# Patient Record
Sex: Female | Born: 1940 | Race: White | Hispanic: No | Marital: Married | Smoking: Never smoker
Health system: Southern US, Community
[De-identification: ages and names within clinical notes are randomized; demographics above are authoritative.]

## PROBLEM LIST (undated history)

## (undated) DIAGNOSIS — F32A Depression, unspecified: Secondary | ICD-10-CM

## (undated) DIAGNOSIS — I480 Paroxysmal atrial fibrillation: Secondary | ICD-10-CM

## (undated) DIAGNOSIS — E785 Hyperlipidemia, unspecified: Secondary | ICD-10-CM

## (undated) DIAGNOSIS — I10 Essential (primary) hypertension: Secondary | ICD-10-CM

## (undated) DIAGNOSIS — F329 Major depressive disorder, single episode, unspecified: Secondary | ICD-10-CM

## (undated) DIAGNOSIS — F419 Anxiety disorder, unspecified: Secondary | ICD-10-CM

## (undated) HISTORY — PX: CHOLECYSTECTOMY: SHX55

## (undated) HISTORY — DX: Depression, unspecified: F32.A

## (undated) HISTORY — DX: Anxiety disorder, unspecified: F41.9

## (undated) HISTORY — PX: ABDOMINAL HYSTERECTOMY: SHX81

## (undated) HISTORY — DX: Major depressive disorder, single episode, unspecified: F32.9

## (undated) HISTORY — PX: BYPASS AXILLA/BRACHIAL ARTERY: SHX6426

## (undated) HISTORY — DX: Paroxysmal atrial fibrillation: I48.0

---

## 2009-09-28 ENCOUNTER — Ambulatory Visit: Payer: Self-pay | Admitting: General Surgery

## 2009-10-18 ENCOUNTER — Ambulatory Visit: Payer: Self-pay | Admitting: General Surgery

## 2009-10-20 ENCOUNTER — Ambulatory Visit: Payer: Self-pay | Admitting: General Surgery

## 2011-11-20 DIAGNOSIS — E785 Hyperlipidemia, unspecified: Secondary | ICD-10-CM | POA: Insufficient documentation

## 2011-11-20 DIAGNOSIS — I10 Essential (primary) hypertension: Secondary | ICD-10-CM | POA: Insufficient documentation

## 2012-05-27 DIAGNOSIS — M419 Scoliosis, unspecified: Secondary | ICD-10-CM | POA: Insufficient documentation

## 2012-11-26 ENCOUNTER — Ambulatory Visit: Payer: Self-pay | Admitting: Internal Medicine

## 2014-02-14 DIAGNOSIS — M549 Dorsalgia, unspecified: Secondary | ICD-10-CM | POA: Insufficient documentation

## 2014-12-07 ENCOUNTER — Other Ambulatory Visit: Payer: Self-pay

## 2014-12-07 DIAGNOSIS — F411 Generalized anxiety disorder: Secondary | ICD-10-CM

## 2014-12-07 NOTE — Telephone Encounter (Signed)
pt forgot to ask for klonopin refill also.

## 2014-12-07 NOTE — Telephone Encounter (Signed)
Pt called . Pt requesting a refill on her tramadol hcl 50mg  tablet, 2 oral qid

## 2014-12-08 NOTE — Telephone Encounter (Signed)
Refill of clonazepam phoned into the [facility] with 5 refills. I do not fill her tramadol and reminded them of that on the phone.

## 2015-05-10 ENCOUNTER — Ambulatory Visit (INDEPENDENT_AMBULATORY_CARE_PROVIDER_SITE_OTHER): Payer: Medicare Other | Admitting: Psychiatry

## 2015-05-10 ENCOUNTER — Encounter: Payer: Self-pay | Admitting: Psychiatry

## 2015-05-10 VITALS — BP 128/84 | HR 76 | Temp 98.1°F | Ht 61.5 in | Wt 159.8 lb

## 2015-05-10 DIAGNOSIS — F411 Generalized anxiety disorder: Secondary | ICD-10-CM | POA: Diagnosis not present

## 2015-05-10 DIAGNOSIS — F4321 Adjustment disorder with depressed mood: Secondary | ICD-10-CM | POA: Diagnosis not present

## 2015-05-10 MED ORDER — CLONAZEPAM 0.5 MG PO TABS: 0.5000 mg | ORAL_TABLET | Freq: Four times a day (QID) | ORAL | Status: DC

## 2015-05-10 NOTE — Progress Notes (Signed)
[facility] Progress Note  05/10/2015 8:19 PM Renae GlossDorothy S Gosnell  MRN:  [id number] Subjective:  This is a follow-up note for this 74 year old woman who has generalized anxiety disorder and continues to have grief. Mood has been up and down but overall improving. No major depression. No suicidal thoughts. Intermittent tearfulness but manages to enjoy spending time with her family still. Anxiety under fairly good control. Functioning well. No sign of abuse of medication or side effects complained of. Principal Problem: @PPROB @ Diagnosis:   Patient Active Problem List   Diagnosis Date Noted  . Anxiety, generalized [F41.1] 12/07/2014   Total Time spent with patient: 25 minutes  Past Psychiatric History: patient has a history of anxiety and depression. History of poor response to trying to come off of medicines such as her Klonopin. Did not tolerate serotonin reuptake inhibitors. No history of suicide attempts or hospitalization.  Past Medical History:  Past Medical History  Diagnosis Date  . Anxiety   . Depression     Past Surgical History  Procedure Laterality Date  . Abdominal hysterectomy    . Bypass axilla/brachial artery    . Cholecystectomy     Family History:  Family History  Problem Relation Age of Onset  . Stroke Mother   . Heart attack Father   . Alcohol abuse Father   . Stroke Sister   . Diabetes Brother   . Colon cancer Sister    Family Psychiatric  History: family history positive for anxiety Social History:  History  Alcohol Use No     History  Drug Use No    Social History   Social History  . Marital Status: Married    Spouse Name: N/A  . Number of Children: N/A  . Years of Education: N/A   Social History Main Topics  . Smoking status: Never Smoker   . Smokeless tobacco: Never Used  . Alcohol Use: No  . Drug Use: No  . Sexual Activity: Not Currently   Other Topics Concern  . None   Social History Narrative  . None   Additional Social History:                          Sleep: Fair  Appetite:  Fair  Current Medications: Current Outpatient Prescriptions  Medication Sig Dispense Refill  . atorvastatin (LIPITOR) 40 MG tablet     . clonazePAM (KLONOPIN) 0.5 MG tablet Take 1 tablet (0.5 mg total) by mouth 4 (four) times daily. 120 tablet 5  . hydrochlorothiazide (HYDRODIURIL) 25 MG tablet     . metoprolol succinate (TOPROL-XL) 100 MG 24 hr tablet     . traMADol (ULTRAM) 50 MG tablet Take 50 mg by mouth 4 (four) times daily. Take 2 tablets by mouth qid     No current facility-administered medications for this visit.    Lab Results: No results found for this or any previous visit (from the past 48 hour(s)).  Physical Findings: AIMS:  , ,  ,  ,    CIWA:    COWS:     Musculoskeletal: Strength & Muscle Tone: within normal limits Gait & Station: normal Patient leans: N/A  Psychiatric Specialty Exam: ROS  Blood pressure 128/84, pulse 76, temperature 98.1 F (36.7 C), temperature source Tympanic, height 5' 1.5" (1.562 m), weight 159 lb 12.8 oz (72.485 kg), SpO2 95 %.Body mass index is 29.71 kg/(m^2).  General Appearance: Well Groomed  Eye Contact::  Good  Speech:  Clear  and Coherent  Volume:  Decreased  Mood:  Euthymic  Affect:  Congruent  Thought Process:  Goal Directed  Orientation:  Full (Time, Place, and Person)  Thought Content:  Negative  Suicidal Thoughts:  No  Homicidal Thoughts:  No  Memory:  Immediate;   Good Recent;   Fair Remote;   Fair  Judgement:  Fair  Insight:  Fair  Psychomotor Activity:  Normal  Concentration:  Fair  Recall:  [employer] of Knowledge:Fair  Language: Fair  Akathisia:  No  Handed:  Right  AIMS (if indicated):     Assets:  Communication Skills Desire for Improvement Financial Resources/Insurance Housing Leisure Time Physical Health Resilience  ADL's:  Intact  Cognition: WNL  Sleep:      Treatment Plan Summary: Medication management and Plan supportive counseling  completed. Encourage patient in her efforts to be socially active. No change to medicine. Reviewed side effects of the Klonopin. No sign of serious cognitive problems. Continue current dose of Klonopin follow-up 6 months. She agrees to the plan.  John Clapacs 05/10/2015, 8:19 PM

## 2015-06-04 ENCOUNTER — Other Ambulatory Visit: Payer: Self-pay | Admitting: Psychiatry

## 2015-06-04 NOTE — Telephone Encounter (Signed)
Called in refills to her pharmacy with 2 additional refills

## 2015-11-08 ENCOUNTER — Ambulatory Visit (INDEPENDENT_AMBULATORY_CARE_PROVIDER_SITE_OTHER): Payer: Medicare Other | Admitting: Psychiatry

## 2015-11-08 ENCOUNTER — Encounter: Payer: Self-pay | Admitting: Psychiatry

## 2015-11-08 VITALS — BP 122/70 | HR 83 | Temp 98.5°F | Ht 61.5 in | Wt 160.2 lb

## 2015-11-08 DIAGNOSIS — R7301 Impaired fasting glucose: Secondary | ICD-10-CM | POA: Insufficient documentation

## 2015-11-08 DIAGNOSIS — I2510 Atherosclerotic heart disease of native coronary artery without angina pectoris: Secondary | ICD-10-CM | POA: Insufficient documentation

## 2015-11-08 DIAGNOSIS — Z79899 Other long term (current) drug therapy: Secondary | ICD-10-CM | POA: Insufficient documentation

## 2015-11-08 DIAGNOSIS — F411 Generalized anxiety disorder: Secondary | ICD-10-CM | POA: Diagnosis not present

## 2015-11-08 MED ORDER — CLONAZEPAM 0.5 MG PO TABS: 0.5000 mg | ORAL_TABLET | Freq: Four times a day (QID) | ORAL | Status: DC

## 2015-11-29 ENCOUNTER — Other Ambulatory Visit: Payer: Self-pay | Admitting: Psychiatry

## 2016-05-08 ENCOUNTER — Telehealth: Payer: Self-pay

## 2016-05-08 ENCOUNTER — Other Ambulatory Visit: Payer: Self-pay | Admitting: Psychiatry

## 2016-05-08 MED ORDER — CLONAZEPAM 0.5 MG PO TABS: 0.5000 mg | ORAL_TABLET | Freq: Four times a day (QID) | ORAL | 1 refills | Status: DC

## 2016-05-08 NOTE — Progress Notes (Signed)
Phoned in a refill of her clonazepam to the drug store for a total of 2 months. Spoke with the patient and informed her I needed to cancel tomorrow's appointment and we can reschedule at a later date.

## 2016-05-08 NOTE — Telephone Encounter (Signed)
Medication request - Refill fax request for patient's Klonopin received.  Last ordered 11/08/15 + 5 refills and patient returns for scheduled evaluation on 05/09/16

## 2016-05-08 NOTE — Telephone Encounter (Signed)
I called the patient today to cancel her appointment. I have called in refills of her prescriptions to her pharmacy and this is documented in the chart.

## 2016-05-09 ENCOUNTER — Ambulatory Visit: Payer: Self-pay | Admitting: Psychiatry

## 2016-06-03 NOTE — Progress Notes (Signed)
Follow-up patient with chronic anxiety and long-term grieving. Still a lot of grieving around her husband as is appropriate. Despite that she is functioning adequately. No suicidal thoughts no psychosis. None of the severe anxiety attack she has had previously.  Neatly groomed woman looks her stated age. Cooperative with interview. Denies suicidal thoughts no evidence of psychosis.  No evidence of any misuse or problem from her benzodiazepines which will be continued as usual. Follow-up 6 months.

## 2016-06-06 ENCOUNTER — Encounter: Payer: Self-pay | Admitting: Psychiatry

## 2016-06-06 ENCOUNTER — Ambulatory Visit (INDEPENDENT_AMBULATORY_CARE_PROVIDER_SITE_OTHER): Payer: Medicare Other | Admitting: Psychiatry

## 2016-06-06 VITALS — BP 157/74 | HR 73 | Temp 97.6°F | Wt 152.4 lb

## 2016-06-06 DIAGNOSIS — F411 Generalized anxiety disorder: Secondary | ICD-10-CM

## 2016-06-06 MED ORDER — CLONAZEPAM 0.5 MG PO TABS: 0.5000 mg | ORAL_TABLET | Freq: Four times a day (QID) | ORAL | 5 refills | Status: DC

## 2016-06-06 NOTE — Progress Notes (Signed)
Follow-up for patient with chronic anxiety. No new complaints. Tolerates medicine well. No evidence of worsening memory problems mood problems or medicine abuse. Patient is alert and oriented. Clean and neat. Good eye contact. Normal psychomotor activity. Affect is upbeat and appropriate. Thoughts are lucid. No suicidal or homicidal ideation.  Continue clonazepam at the current dose which has been tolerated for years. Follow-up 6 months.

## 2016-12-05 ENCOUNTER — Ambulatory Visit (INDEPENDENT_AMBULATORY_CARE_PROVIDER_SITE_OTHER): Payer: Medicare Other | Admitting: Psychiatry

## 2016-12-05 ENCOUNTER — Encounter: Payer: Self-pay | Admitting: Psychiatry

## 2016-12-05 VITALS — BP 137/75 | HR 76 | Temp 97.9°F | Wt 155.4 lb

## 2016-12-05 DIAGNOSIS — F411 Generalized anxiety disorder: Secondary | ICD-10-CM | POA: Diagnosis not present

## 2016-12-05 MED ORDER — CLONAZEPAM 0.5 MG PO TABS: 0.5000 mg | ORAL_TABLET | Freq: Four times a day (QID) | ORAL | 5 refills | Status: DC

## 2016-12-05 NOTE — Progress Notes (Signed)
Follow-up for 76 year old woman with chronic anxiety. Recently apparently she told her primary care doctor that she could tolerate a lower dose of tramadol. She admits that she now wishes she had not done this. Patient is thinking about talking to Dr. Dareen PianoAnderson about going back to 6 pills a day. Otherwise mood is stable no sign of return of severe depression. Anxiety is under control.  Neatly dressed and groomed. Good eye contact. Appropriate affect. Lucid no thought disorder. No sign of suicidality no dangerousness no sign of severe memory complaints. Patient appears to be taking care of herself well.  Continue clonazepam 4 times a day well-tolerated follow-up in another 6 months.

## 2016-12-27 ENCOUNTER — Telehealth: Payer: Self-pay

## 2016-12-27 ENCOUNTER — Other Ambulatory Visit: Payer: Self-pay | Admitting: Psychiatry

## 2016-12-27 NOTE — Telephone Encounter (Signed)
I had thought that I had written it out by hand and given it to her, but I will call it in to the drugstore right this minute. Thank you.

## 2016-12-27 NOTE — Telephone Encounter (Signed)
recieved a fax requesting a refill on clonazepam .5mg  pt was last seen on 12-05-16 next appt 05-29-17.   Disp Refills Start End   clonazePAM (KLONOPIN) 0.5 MG tablet 120 tablet 5 12/05/2016    Sig - Route: Take 1 tablet (0.5 mg total) by mouth 4 (four) times daily. - Oral   Class: No Print

## 2016-12-27 NOTE — Progress Notes (Signed)
Although I am certain I wrote this out by hand at our last visit, I have called in a refill of her clonazepam as requested by her drugstore

## 2017-05-29 ENCOUNTER — Ambulatory Visit: Payer: Medicare Other | Admitting: Psychiatry

## 2017-06-14 ENCOUNTER — Encounter: Payer: Self-pay | Admitting: Psychiatry

## 2017-06-14 ENCOUNTER — Ambulatory Visit (INDEPENDENT_AMBULATORY_CARE_PROVIDER_SITE_OTHER): Payer: Medicare Other | Admitting: Psychiatry

## 2017-06-14 ENCOUNTER — Other Ambulatory Visit: Payer: Self-pay

## 2017-06-14 VITALS — BP 127/71 | HR 75 | Temp 97.5°F | Wt 145.4 lb

## 2017-06-14 DIAGNOSIS — F411 Generalized anxiety disorder: Secondary | ICD-10-CM | POA: Diagnosis not present

## 2017-06-14 MED ORDER — CLONAZEPAM 0.5 MG PO TABS: 0.5000 mg | ORAL_TABLET | Freq: Four times a day (QID) | ORAL | 5 refills | Status: DC

## 2017-06-14 NOTE — Progress Notes (Signed)
Follow-up for this 76 year old woman with generalized anxiety disorder.  She presents today in the company of her daughter.  Patient describes feeling significantly worse than usual.  For the last couple months she has been more nervous.  Pain has been worse.  Sleep is difficult.  Denies suicidal intent or plan but does feel hopeless.  She has become [place] phobic with a great deal of fear about leaving the house.  This has greatly affected her ability to interact with her family.  The story behind this is that the patient took it upon herself to drastically decrease her dose of tramadol a couple months ago.  Apparently she became convinced that Dr. Dareen PianoAnderson was going to stop her tramadol and so she cut herself down from 300 mg a day to 100 mg a day.  Within the last month she also tried decreasing her clonazepam from 4 pills a day to 3 pills a day for reasons that I still do not quite understand.  I reviewed the prescription on her bottle with her.  I do not have access to Dr. Ewell PoeAnderson's notes in my office but I told her that from what the bottle says I do not see any evidence that he was trying to taper her.  She could not remember him actually telling her to decrease the dose of her medicine.  Patient is anxious nervous.  Still is well dressed as usual.  Seems to be a little confused in her thinking.  No suicidal ideation or psychosis however.  Differential diagnosis is pain and anxiety from decreasing her medicine, worsening anxiety disorder itself, possible depression possible dementia.  Based on her past history I think the most likely thing is that this is all a result of decreasing her medicine.  I suggested that she go back to taking her regular full dose of all of her medicines and I would like to see her back in 1 or 2 weeks.  Patient agrees to the plan.  I also suggest that she call Dr. Dareen PianoAnderson to find out whether he had wanted to change her medicine or not.

## 2017-06-28 ENCOUNTER — Other Ambulatory Visit: Payer: Self-pay

## 2017-06-28 ENCOUNTER — Ambulatory Visit (INDEPENDENT_AMBULATORY_CARE_PROVIDER_SITE_OTHER): Payer: Medicare Other | Admitting: Psychiatry

## 2017-06-28 ENCOUNTER — Encounter: Payer: Self-pay | Admitting: Psychiatry

## 2017-06-28 VITALS — BP 139/78 | HR 66 | Temp 97.4°F | Wt 145.8 lb

## 2017-06-28 DIAGNOSIS — F411 Generalized anxiety disorder: Secondary | ICD-10-CM | POA: Diagnosis not present

## 2017-06-28 MED ORDER — ESCITALOPRAM OXALATE 5 MG PO TABS: 5.0000 mg | ORAL_TABLET | Freq: Every day | ORAL | 3 refills | Status: DC

## 2017-06-28 NOTE — Progress Notes (Signed)
Follow-up 77 year old woman with a history of chronic anxiety.  Patient has been back on her medicine at full dose and is feeling much better.  Not having panicky feelings not feeling agitated.  She did have a fall on New Year's but it sounds like it was related to snow and ice in the dark and not to her medicine.  Affect is calm although still slightly anxious.  Patient is neatly dressed and groomed and comes with another 1 of her daughters today.  There does seem to be a little bit of slowing in her thought process compared to what I was used to in the past.  No sign of dangerousness.  I spent some time explaining to the patient and her daughter the risks of benzodiazepines and pain medicines in the elderly and the reason why it is often the case that we try to minimize these medicines.  I reassured her that I would not be trying to taper her off medicine against her will but I did agree with the suggestion floated by 1 of her daughters that we try an antidepressant medicine.  Patient agrees to do it a trial of Lexapro 5 mg a day.  Side effects discussed.  We will follow-up in 3 months to see how she is doing.

## 2017-07-21 ENCOUNTER — Other Ambulatory Visit: Payer: Self-pay | Admitting: Psychiatry

## 2017-07-31 ENCOUNTER — Telehealth: Payer: Self-pay

## 2017-07-31 NOTE — Telephone Encounter (Signed)
  Prior Berkley Harveyauth is requested on this medication. Pt was last seen on  06-28-17   clonazePAM (KLONOPIN) 0.5 MG tablet  Medication  Date: 07/30/2017 Department: [facility] Ordering/Authorizing: Clapacs, Jackquline DenmarkJohn T, MD  Order Providers   Prescribing Provider Encounter Provider  Clapacs, Jackquline DenmarkJohn T, MD Clapacs, Jackquline DenmarkJohn T, MD  Medication Detail    Disp Refills Start End   clonazePAM (KLONOPIN) 0.5 MG tablet 120 tablet 0 07/30/2017    Sig: TAKE 1 TABLET BY MOUTH FOUR TIMES DAILY   Sent to pharmacy as: clonazePAM (KLONOPIN) 0.5 MG tablet   E-Prescribing Status: Receipt confirmed by pharmacy (07/30/2017 8:13 PM EST)   Ph[facility]   [facility] [id number] - [place], [place] - [address] OF [address]

## 2017-07-31 NOTE — Telephone Encounter (Signed)
PA - pending  done on [email or web address]

## 2017-08-06 NOTE — Telephone Encounter (Signed)
received approval notice for clonazepam .5mg  untll further notices.

## 2017-09-11 DIAGNOSIS — K529 Noninfective gastroenteritis and colitis, unspecified: Secondary | ICD-10-CM | POA: Insufficient documentation

## 2017-09-25 ENCOUNTER — Ambulatory Visit (INDEPENDENT_AMBULATORY_CARE_PROVIDER_SITE_OTHER): Payer: Medicare Other | Admitting: Psychiatry

## 2017-09-25 ENCOUNTER — Other Ambulatory Visit: Payer: Self-pay

## 2017-09-25 ENCOUNTER — Encounter: Payer: Self-pay | Admitting: Psychiatry

## 2017-09-25 VITALS — BP 129/71 | HR 70 | Temp 98.4°F | Wt 141.2 lb

## 2017-09-25 DIAGNOSIS — F411 Generalized anxiety disorder: Secondary | ICD-10-CM

## 2017-09-25 DIAGNOSIS — Z79899 Other long term (current) drug therapy: Secondary | ICD-10-CM | POA: Insufficient documentation

## 2017-09-25 MED ORDER — ESCITALOPRAM OXALATE 5 MG PO TABS: 5.0000 mg | ORAL_TABLET | Freq: Every day | ORAL | 5 refills | Status: DC

## 2017-09-25 MED ORDER — CLONAZEPAM 0.5 MG PO TABS: 0.5000 mg | ORAL_TABLET | Freq: Four times a day (QID) | ORAL | 5 refills | Status: DC

## 2017-09-25 NOTE — Progress Notes (Signed)
Follow-up for this 77 year old woman with a history of chronic anxiety.  Patient's mood is stated as being good.  Anxiety under good control.  No panic attacks no agitation.  She is able to drive her car once again.  Sleeping adequately.  No side effects of medicine.  Not oversedated no cognitive impairment.  Tolerating the low-dose of citalopram without difficulty.  Neatly dressed and groomed.  Good eye contact normal psychomotor activity.  Speech normal rate tone and volume.  Affect euthymic no thoughts of suicide no disorganized thinking no cognitive impairment  No physical complaints.  Cardiac complaints negative lung complaints negative.  Musculoskeletal negative.  Continue current medicines.  Renewed all prescriptions.  Supportive therapy and review of treatment plan follow-up 6 months.

## 2018-03-05 ENCOUNTER — Other Ambulatory Visit: Payer: Self-pay

## 2018-03-05 ENCOUNTER — Encounter: Payer: Self-pay | Admitting: Emergency Medicine

## 2018-03-05 ENCOUNTER — Emergency Department: Payer: Medicare Other

## 2018-03-05 ENCOUNTER — Observation Stay
Admission: EM | Admit: 2018-03-05 | Discharge: 2018-03-07 | Disposition: A | Discharge status: Home / Self Care | Payer: Medicare Other | Attending: Family Medicine | Admitting: Family Medicine

## 2018-03-05 DIAGNOSIS — I119 Hypertensive heart disease without heart failure: Secondary | ICD-10-CM | POA: Insufficient documentation

## 2018-03-05 DIAGNOSIS — K625 Hemorrhage of anus and rectum: Secondary | ICD-10-CM | POA: Diagnosis present

## 2018-03-05 DIAGNOSIS — Z9049 Acquired absence of other specified parts of digestive tract: Secondary | ICD-10-CM | POA: Insufficient documentation

## 2018-03-05 DIAGNOSIS — K5730 Diverticulosis of large intestine without perforation or abscess without bleeding: Secondary | ICD-10-CM | POA: Insufficient documentation

## 2018-03-05 DIAGNOSIS — Z7982 Long term (current) use of aspirin: Secondary | ICD-10-CM | POA: Insufficient documentation

## 2018-03-05 DIAGNOSIS — K922 Gastrointestinal hemorrhage, unspecified: Secondary | ICD-10-CM | POA: Diagnosis present

## 2018-03-05 DIAGNOSIS — I2510 Atherosclerotic heart disease of native coronary artery without angina pectoris: Secondary | ICD-10-CM | POA: Diagnosis not present

## 2018-03-05 DIAGNOSIS — Z880 Allergy status to penicillin: Secondary | ICD-10-CM | POA: Insufficient documentation

## 2018-03-05 DIAGNOSIS — F329 Major depressive disorder, single episode, unspecified: Secondary | ICD-10-CM | POA: Diagnosis not present

## 2018-03-05 DIAGNOSIS — Z951 Presence of aortocoronary bypass graft: Secondary | ICD-10-CM | POA: Diagnosis not present

## 2018-03-05 DIAGNOSIS — Z66 Do not resuscitate: Secondary | ICD-10-CM | POA: Insufficient documentation

## 2018-03-05 DIAGNOSIS — E119 Type 2 diabetes mellitus without complications: Secondary | ICD-10-CM | POA: Insufficient documentation

## 2018-03-05 DIAGNOSIS — Z90710 Acquired absence of both cervix and uterus: Secondary | ICD-10-CM | POA: Diagnosis not present

## 2018-03-05 DIAGNOSIS — Z79899 Other long term (current) drug therapy: Secondary | ICD-10-CM | POA: Insufficient documentation

## 2018-03-05 DIAGNOSIS — E785 Hyperlipidemia, unspecified: Secondary | ICD-10-CM | POA: Diagnosis not present

## 2018-03-05 DIAGNOSIS — Z23 Encounter for immunization: Secondary | ICD-10-CM | POA: Diagnosis not present

## 2018-03-05 DIAGNOSIS — K921 Melena: Secondary | ICD-10-CM | POA: Diagnosis not present

## 2018-03-05 DIAGNOSIS — K5790 Diverticulosis of intestine, part unspecified, without perforation or abscess without bleeding: Secondary | ICD-10-CM

## 2018-03-05 DIAGNOSIS — M419 Scoliosis, unspecified: Secondary | ICD-10-CM | POA: Insufficient documentation

## 2018-03-05 DIAGNOSIS — F411 Generalized anxiety disorder: Secondary | ICD-10-CM | POA: Diagnosis not present

## 2018-03-05 DIAGNOSIS — Z882 Allergy status to sulfonamides status: Secondary | ICD-10-CM | POA: Diagnosis not present

## 2018-03-05 HISTORY — DX: Hyperlipidemia, unspecified: E78.5

## 2018-03-05 HISTORY — DX: Essential (primary) hypertension: I10

## 2018-03-05 LAB — LIPID PANEL
Cholesterol: 110 mg/dL (ref 0–200)
HDL: 35 mg/dL — ABNORMAL LOW (ref >40)
LDL Cholesterol: 42 mg/dL (ref 0–99)
TRIGLYCERIDES: 165 mg/dL — AB (ref <150)
Total CHOL/HDL Ratio: 3.1 RATIO
VLDL: 33 mg/dL (ref 0–40)

## 2018-03-05 LAB — CBC WITH DIFFERENTIAL/PLATELET
BASOS ABS: 0 10*3/uL (ref 0–0.1)
Basophils Relative: 1 %
Eosinophils Absolute: 0 10*3/uL (ref 0–0.7)
Eosinophils Relative: 1 %
HCT: 39.9 % (ref 35.0–47.0)
Hemoglobin: 13.2 g/dL (ref 12.0–16.0)
LYMPHS PCT: 23 %
Lymphs Abs: 1.7 10*3/uL (ref 1.0–3.6)
MCH: 29.6 pg (ref 26.0–34.0)
MCHC: 33 g/dL (ref 32.0–36.0)
MCV: 89.7 fL (ref 80.0–100.0)
Monocytes Absolute: 0.6 10*3/uL (ref 0.2–0.9)
Monocytes Relative: 9 %
Neutro Abs: 4.8 10*3/uL (ref 1.4–6.5)
Neutrophils Relative %: 66 %
Platelets: 218 10*3/uL (ref 150–440)
RBC: 4.45 MIL/uL (ref 3.80–5.20)
RDW: 13.9 % (ref 11.5–14.5)
WBC: 7.2 10*3/uL (ref 3.6–11.0)

## 2018-03-05 LAB — COMPREHENSIVE METABOLIC PANEL
ALT: 25 U/L (ref 0–44)
AST: 33 U/L (ref 15–41)
Albumin: 3.6 g/dL (ref 3.5–5.0)
Alkaline Phosphatase: 80 U/L (ref 38–126)
Anion gap: 11 (ref 5–15)
BUN: 16 mg/dL (ref 8–23)
CHLORIDE: 99 mmol/L (ref 98–111)
CO2: 28 mmol/L (ref 22–32)
Calcium: 9.1 mg/dL (ref 8.9–10.3)
Creatinine, Ser: 0.74 mg/dL (ref 0.44–1.00)
GFR calc Af Amer: >60 mL/min (ref >60)
Glucose, Bld: 135 mg/dL — ABNORMAL HIGH (ref 70–99)
Potassium: 3.3 mmol/L — ABNORMAL LOW (ref 3.5–5.1)
Sodium: 138 mmol/L (ref 135–145)
TOTAL PROTEIN: 7.5 g/dL (ref 6.5–8.1)
Total Bilirubin: 0.6 mg/dL (ref 0.3–1.2)

## 2018-03-05 LAB — TYPE AND SCREEN
ABO/RH(D): O POS
Antibody Screen: NEGATIVE

## 2018-03-05 LAB — LIPASE, BLOOD: LIPASE: 22 U/L (ref 11–51)

## 2018-03-05 LAB — TROPONIN I: Troponin I: <0.03 ng/mL (ref <0.03)

## 2018-03-05 LAB — PROTIME-INR
INR: 1.03
Prothrombin Time: 13.4 seconds (ref 11.4–15.2)

## 2018-03-05 LAB — HEMOGLOBIN
HEMOGLOBIN: 13.2 g/dL (ref 12.0–16.0)
HEMOGLOBIN: 12.7 g/dL (ref 12.0–16.0)

## 2018-03-05 MED ORDER — OMEGA-3-ACID ETHYL ESTERS 1 G PO CAPS
1.0000 g | ORAL_CAPSULE | Freq: Every day | ORAL | Status: DC
  Administered 2018-03-06: 1 g via ORAL
  Filled 2018-03-05: qty 1

## 2018-03-05 MED ORDER — ACETAMINOPHEN 325 MG PO TABS: 650.0000 mg | ORAL_TABLET | Freq: Four times a day (QID) | ORAL | Status: DC | PRN

## 2018-03-05 MED ORDER — ONDANSETRON HCL 4 MG PO TABS: 4.0000 mg | ORAL_TABLET | Freq: Four times a day (QID) | ORAL | Status: DC | PRN

## 2018-03-05 MED ORDER — INFLUENZA VAC SPLIT HIGH-DOSE 0.5 ML IM SUSY
0.5000 mL | PREFILLED_SYRINGE | INTRAMUSCULAR | Status: AC
  Administered 2018-03-06: 0.5 mL via INTRAMUSCULAR
  Filled 2018-03-05 (×2): qty 0.5

## 2018-03-05 MED ORDER — ACETAMINOPHEN 650 MG RE SUPP: 650.0000 mg | Freq: Four times a day (QID) | RECTAL | Status: DC | PRN

## 2018-03-05 MED ORDER — ADULT MULTIVITAMIN W/MINERALS CH
1.0000 | ORAL_TABLET | Freq: Every day | ORAL | Status: DC
  Administered 2018-03-06: 1 via ORAL
  Filled 2018-03-05: qty 1

## 2018-03-05 MED ORDER — CLONAZEPAM 0.5 MG PO TABS
0.5000 mg | ORAL_TABLET | Freq: Four times a day (QID) | ORAL | Status: DC
  Administered 2018-03-05 – 2018-03-06 (×6): 0.5 mg via ORAL
  Filled 2018-03-05 (×6): qty 1

## 2018-03-05 MED ORDER — IOPAMIDOL (ISOVUE-300) INJECTION 61%
100.0000 mL | Freq: Once | INTRAVENOUS | Status: AC | PRN
  Administered 2018-03-05: 100 mL via INTRAVENOUS

## 2018-03-05 MED ORDER — METOPROLOL SUCCINATE ER 50 MG PO TB24
50.0000 mg | ORAL_TABLET | Freq: Every day | ORAL | Status: DC
  Administered 2018-03-05 – 2018-03-06 (×2): 50 mg via ORAL
  Filled 2018-03-05 (×2): qty 1

## 2018-03-05 MED ORDER — TRAMADOL HCL 50 MG PO TABS
100.0000 mg | ORAL_TABLET | Freq: Two times a day (BID) | ORAL | Status: DC
  Administered 2018-03-05 – 2018-03-06 (×3): 100 mg via ORAL
  Filled 2018-03-05 (×3): qty 2

## 2018-03-05 MED ORDER — ONDANSETRON HCL 4 MG/2ML IJ SOLN: 4.0000 mg | Freq: Four times a day (QID) | INTRAMUSCULAR | Status: DC | PRN

## 2018-03-05 MED ORDER — METOPROLOL SUCCINATE ER 50 MG PO TB24
100.0000 mg | ORAL_TABLET | Freq: Every morning | ORAL | Status: DC
  Administered 2018-03-07: 100 mg via ORAL
  Filled 2018-03-05 (×2): qty 2

## 2018-03-05 MED ORDER — POTASSIUM GLUCONATE 550 MG PO TABS: 1100.0000 mg | ORAL_TABLET | Freq: Every day | ORAL | Status: DC

## 2018-03-05 MED ORDER — ESCITALOPRAM OXALATE 10 MG PO TABS
5.0000 mg | ORAL_TABLET | Freq: Every day | ORAL | Status: DC
  Administered 2018-03-06: 5 mg via ORAL
  Filled 2018-03-05 (×2): qty 0.5

## 2018-03-05 MED ORDER — ATORVASTATIN CALCIUM 20 MG PO TABS
40.0000 mg | ORAL_TABLET | Freq: Every day | ORAL | Status: DC
  Administered 2018-03-05 – 2018-03-06 (×2): 40 mg via ORAL
  Filled 2018-03-05 (×2): qty 2

## 2018-03-05 MED ORDER — POTASSIUM CHLORIDE ER 8 MEQ PO TBCR
8.0000 meq | EXTENDED_RELEASE_TABLET | Freq: Two times a day (BID) | ORAL | Status: DC
  Administered 2018-03-05 – 2018-03-06 (×2): 8 meq via ORAL
  Filled 2018-03-05 (×5): qty 1

## 2018-03-05 NOTE — ED Notes (Signed)
Pt helped to bedside toilet.

## 2018-03-05 NOTE — ED Notes (Signed)
Diet Coke given. OK per EDP.

## 2018-03-05 NOTE — ED Notes (Signed)
ED Provider at bedside. 

## 2018-03-05 NOTE — ED Notes (Signed)
Pt collected urine spec but bleeding noted from rectum into sample, MD aware , not to send at this time due to unclean sample

## 2018-03-05 NOTE — ED Notes (Signed)
Pt transferred with stretcher to CT

## 2018-03-05 NOTE — H&P (Signed)
[facility] - [place] at [facility]   PATIENT NAME: Michelle Bauer    MR#:  [id number]  DATE OF BIRTH:  July 21, 1940  DATE OF ADMISSION:  03/05/2018  PRIMARY CARE PHYSICIAN: [name], MD   REQUESTING/REFERRING PHYSICIAN: Dr. Daryel November  CHIEF COMPLAINT:   Chief Complaint  Patient presents with  . Rectal Bleeding    HISTORY OF PRESENT ILLNESS:  Michelle Bauer  is a 77 y.o. female with a known history of hypertension, hyperlipidemia, anxiety/depression, severe scoliosis who presents to the hospital due to multiple episodes of rectal bleeding.  Patient says she was in her usual state of health when 2:00 this morning she woke up and started having bloody stools.  Patient says her stools initially had some brown stool mixed with fresh blood but this morning is been mostly fresh blood with some clots.  She came to the ER and has had multiple episodes of rectal bleeding.  Her hemoglobin is stable, but given her ongoing rectal bleeding hospitalist services were contacted for admission.  Patient had some abdominal cramping earlier but it has resolved now, she denies any fevers chills chest pain shortness of breath or any other associated symptoms.  PAST MEDICAL HISTORY:   Past Medical History:  Diagnosis Date  . Anxiety   . Depression   . Essential hypertension   . Hyperlipidemia     PAST SURGICAL HISTORY:   Past Surgical History:  Procedure Laterality Date  . ABDOMINAL HYSTERECTOMY    . BYPASS AXILLA/BRACHIAL ARTERY    . CHOLECYSTECTOMY      SOCIAL HISTORY:   Social History   Tobacco Use  . Smoking status: Never Smoker  . Smokeless tobacco: Never Used  Substance Use Topics  . Alcohol use: No    Alcohol/week: 0.0 standard drinks    FAMILY HISTORY:   Family History  Problem Relation Age of Onset  . Stroke Mother   . Heart attack Father   . Alcohol abuse Father   . Stroke Sister   . Diabetes Brother   . Colon cancer Sister     DRUG  ALLERGIES:   Allergies  Allergen Reactions  . Other Other (See Comments)  . Penicillins Other (See Comments)    Has patient had a PCN reaction causing immediate rash, facial/tongue/throat swelling, SOB or lightheadedness with hypotension: Unknown Has patient had a PCN reaction causing severe rash involving mucus membranes or skin necrosis: Unknown Has patient had a PCN reaction that required hospitalization: Unknown Has patient had a PCN reaction occurring within the last 10 years: No If all of the above answers are "NO", then may proceed with Cephalosporin use.  . Sulfa Antibiotics Other (See Comments)    REVIEW OF SYSTEMS:   Review of Systems  Constitutional: Negative for fever and weight loss.  HENT: Negative for congestion, nosebleeds and tinnitus.   Eyes: Negative for blurred vision, double vision and redness.  Respiratory: Negative for cough, hemoptysis and shortness of breath.   Cardiovascular: Negative for chest pain, orthopnea, leg swelling and PND.  Gastrointestinal: Positive for blood in stool. Negative for abdominal pain, diarrhea, melena, nausea and vomiting.  Genitourinary: Negative for dysuria, hematuria and urgency.  Musculoskeletal: Negative for falls and joint pain.  Neurological: Negative for dizziness, tingling, sensory change, focal weakness, seizures, weakness and headaches.  Endo/Heme/Allergies: Negative for polydipsia. Does not bruise/bleed easily.  Psychiatric/Behavioral: Negative for depression and memory loss. The patient is not nervous/anxious.     MEDICATIONS AT HOME:   Prior to  Admission medications   Medication Sig Start Date End Date Taking? Authorizing Provider  aspirin 325 MG tablet Take 325 mg by mouth daily.   Yes [provider]  atorvastatin (LIPITOR) 40 MG tablet Take 40 mg by mouth daily with supper.    Yes [provider]  clonazePAM (KLONOPIN) 0.5 MG tablet Take 1 tablet (0.5 mg total) by mouth 4 (four) times daily.  09/25/17  Yes Clapacs, Jackquline DenmarkJohn T, MD  escitalopram (LEXAPRO) 5 MG tablet Take 1 tablet (5 mg total) by mouth daily. 09/25/17  Yes Clapacs, Jackquline DenmarkJohn T, MD  hydrochlorothiazide (HYDRODIURIL) 25 MG tablet Take 25 mg by mouth daily.    Yes [provider]  metoprolol succinate (TOPROL-XL) 100 MG 24 hr tablet Take 50-100 mg by mouth See admin instructions. 100 mg every morning and 50 mg at bedtime   Yes [provider]  Multiple Vitamins-Minerals (ONE-A-DAY WOMENS 50 PLUS) TABS Take 1 tablet by mouth daily.   Yes [provider]  omega-3 acid ethyl esters (LOVAZA) 1 g capsule Take 1 g by mouth daily with lunch.   Yes [provider]  Potassium Gluconate 550 MG TABS Take 1,100 mg by mouth daily.   Yes [provider]  traMADol (ULTRAM) 50 MG tablet Take 100 mg by mouth 2 (two) times daily.    Yes Clapacs, Jackquline DenmarkJohn T, MD      VITAL SIGNS:  Blood pressure 132/63, pulse 66, temperature 99.1 F (37.3 C), temperature source Oral, resp. rate 20, height 5\' 2"  (1.575 m), weight 64.4 kg, SpO2 100 %.  PHYSICAL EXAMINATION:  Physical Exam  GENERAL:  77 y.o.-year-old patient lying in the bed with no acute distress.  EYES: Pupils equal, round, reactive to light and accommodation. No scleral icterus. Extraocular muscles intact.  HEENT: Head atraumatic, normocephalic. Oropharynx and nasopharynx clear. No oropharyngeal erythema, moist oral mucosa  NECK:  Supple, no jugular venous distention. No thyroid enlargement, no tenderness.  LUNGS: Normal breath sounds bilaterally, no wheezing, rales, rhonchi. No use of accessory muscles of respiration.  CARDIOVASCULAR: S1, S2 RRR. No murmurs, rubs, gallops, clicks.  ABDOMEN: Soft, nontender, nondistended. Bowel sounds present. No organomegaly or mass.  EXTREMITIES: No pedal edema, cyanosis, or clubbing. + 2 pedal & radial pulses b/l.   NEUROLOGIC: Cranial nerves II through XII are intact. No focal Motor or sensory deficits appreciated  b/l PSYCHIATRIC: The patient is alert and oriented x 3. SKIN: No obvious rash, lesion, or ulcer.  Musc:  Severe Scoliosis.    LABORATORY PANEL:   CBC Recent Labs  Lab 03/05/18 1019  WBC 7.2  HGB 13.2  HCT 39.9  PLT 218   ------------------------------------------------------------------------------------------------------------------  Chemistries  Recent Labs  Lab 03/05/18 1019  NA 138  K 3.3*  CL 99  CO2 28  GLUCOSE 135*  BUN 16  CREATININE 0.74  CALCIUM 9.1  AST 33  ALT 25  ALKPHOS 80  BILITOT 0.6   ------------------------------------------------------------------------------------------------------------------  Cardiac Enzymes Recent Labs  Lab 03/05/18 1019  TROPONINI <0.03   ------------------------------------------------------------------------------------------------------------------  RADIOLOGY:  Ct Abdomen Pelvis W Contrast  Result Date: 03/05/2018 CLINICAL DATA:  Rectal bleeding, abdominal pain. EXAM: CT ABDOMEN AND PELVIS WITH CONTRAST TECHNIQUE: Multidetector CT imaging of the abdomen and pelvis was performed using the standard protocol following bolus administration of intravenous contrast. CONTRAST:  100mL ISOVUE-300 IOPAMIDOL (ISOVUE-300) INJECTION 61% COMPARISON:  None. FINDINGS: Lower chest: Cardiomegaly.  No acute abnormality. Hepatobiliary: No focal liver abnormality is seen. Status post cholecystectomy. No biliary dilatation. Pancreas: No  focal abnormality or ductal dilatation. Spleen: No focal abnormality.  Normal size. Adrenals/Urinary Tract: No adrenal abnormality. No focal renal abnormality. No stones or hydronephrosis. Urinary bladder is unremarkable. Stomach/Bowel: Severe diverticulosis in the descending colon and sigmoid colon. Slight haziness adjacent to the distal descending colon could reflect early active diverticulitis. No evidence of bowel obstruction. Moderate stool burden. Appendix is normal. Vascular/Lymphatic: Aortic  atherosclerosis. No enlarged abdominal or pelvic lymph nodes. Reproductive: Prior hysterectomy.  No adnexal masses. Other: No free fluid or free air. Musculoskeletal: Severe thoracolumbar scoliosis. No acute bony abnormality. IMPRESSION: Severe descending colonic and sigmoid diverticulosis. Slight stranding around the distal descending colon could reflect early active diverticulitis. Moderate stool burden throughout the colon. Prior cholecystectomy and hysterectomy. Cardiomegaly. Severe thoracolumbar scoliosis. Electronically Signed   By: Charlett Nose M.D.   On: 03/05/2018 11:44     IMPRESSION AND PLAN:   77 year old female with past medical history of hypertension, hyperlipidemia, anxiety/depression, severe scoliosis who presents to the hospital due to multiple episodes of rectal bleeding.  1.  GI bleed-this is suspected to be a lower GI bleed given her rectal bleeding.  Suspect this is likely diverticular in nature.  Patient had a CT scan of her abdomen pelvis which shows severe colonic and sigmoid diverticulosis without evidence of acute diverticulitis. - We will observe the patient overnight, follow serial hemoglobins.  Transfuse if hemoglobin falls below 7. - Place patient on clear liquid diet, hold her aspirin, will get a gastroenterology consult.  Discussed the case with Dr. Servando Snare.   2.  Essential hypertension-continue Toprol, hold hydrochlorothiazide for now.  3.  Anxiety/depression-continue Klonopin, Lexapro.  4.  Hyperlipidemia-continue atorvastatin.  5. Hx of Severe Scoliosis - cont. Tramadol PRN.    All the records are reviewed and case discussed with ED provider. Management plans discussed with the patient, family and they are in agreement.  CODE STATUS: DNR  TOTAL TIME TAKING CARE OF THIS PATIENT: 40 minutes.    Houston Siren M.D on 03/05/2018 at 1:24 PM  Between 7am to 6pm - Pager - [phone]  After 6pm go to [email or web address] - password [email or web address] [facility]  [place]  [facility]  Office  [phone]  CC: Primary care physician; [name], MD

## 2018-03-05 NOTE — ED Notes (Signed)
Assisted to bathroom

## 2018-03-05 NOTE — Progress Notes (Signed)
   [employer] - [place] at [facility]   Advance care planning  Hospital Day: 0 days Michelle Bauer is a 77 y.o. female past medical history of diabetes, hypertension, anxiety/depression, history of severe scoliosis presenting with Rectal Bleeding .   Discussed the patient's CODE STATUS with daughter at bedside.  Explained to the patient difference between DNR and full code, and after these discussions patient does not want to have aggressive resuscitation or measures.  Pt. Is DNR.    Advance care planning discussed with patient  with additional Family at bedside. All questions in regards to overall condition and expected prognosis answered. The decision was made to change current code status  CODE STATUS: dnr Time spent: 16 minutes

## 2018-03-05 NOTE — ED Triage Notes (Signed)
Pt reports this am started with rectal bleeding. Pt states some clots in the blood. Pt reports some of the clots are dark in color and some bright red blood as well. Pt c/o pain to abdomen.

## 2018-03-05 NOTE — ED Notes (Signed)
Daughter at bedside.

## 2018-03-05 NOTE — ED Notes (Signed)
Admitting MD at bedside.

## 2018-03-05 NOTE — ED Notes (Signed)
Pt brought beef broth and saltines. OK per EDP.

## 2018-03-05 NOTE — ED Notes (Signed)
Pt returns from CT.

## 2018-03-05 NOTE — ED Notes (Signed)
Daughter asked for lights to be turned down in room. Pt resting at this time. Daughter remains at bedside.

## 2018-03-05 NOTE — ED Provider Notes (Signed)
[facility] Emergency Department Provider Note       Time seen: ----------------------------------------- 10:05 AM on 03/05/2018 -----------------------------------------   I have reviewed the triage vital signs and the nursing notes.  HISTORY   Chief Complaint Rectal Bleeding    HPI Michelle Bauer is a 77 y.o. female with a history of anxiety, depression, coronary artery disease, high blood pressure, hyperlipidemia who presents to the ED for rectal bleeding.  Patient noted some clots in the blood coming from her rectum.  She states some clots are dark in color and some of bright red as well.  She is complaining of pain to the abdomen across the entire lower abdomen.  She has never had this happen before.  Nothing makes the symptoms better or worse.  Past Medical History:  Diagnosis Date  . Anxiety   . Depression     Patient Active Problem List   Diagnosis Date Noted  . Encounter for long-term (current) use of other medications 09/25/2017  . Chronic diarrhea 09/11/2017  . CAD in native artery 11/08/2015  . Other long term (current) drug therapy 11/08/2015  . Elevated fasting blood sugar 11/08/2015  . Anxiety, generalized 12/07/2014  . Back ache 02/14/2014  . Scoliosis 05/27/2012  . BP (high blood pressure) 11/20/2011  . HLD (hyperlipidemia) 11/20/2011    Past Surgical History:  Procedure Laterality Date  . ABDOMINAL HYSTERECTOMY    . BYPASS AXILLA/BRACHIAL ARTERY    . CHOLECYSTECTOMY      Allergies Other; Penicillins; and Sulfa antibiotics  Social History Social History   Tobacco Use  . Smoking status: Never Smoker  . Smokeless tobacco: Never Used  Substance Use Topics  . Alcohol use: No    Alcohol/week: 0.0 standard drinks  . Drug use: No   Review of Systems Constitutional: Negative for fever. Cardiovascular: Negative for chest pain. Respiratory: Negative for shortness of breath. Gastrointestinal: Positive for abdominal pain,  negative for vomiting and diarrhea.  Positive for rectal bleeding Musculoskeletal: Negative for back pain. Skin: Negative for rash. Neurological: Negative for headaches, focal weakness or numbness.  All systems negative/normal/unremarkable except as stated in the HPI  ____________________________________________   PHYSICAL EXAM:  VITAL SIGNS: ED Triage Vitals  Enc Vitals Group     BP 03/05/18 0952 140/71     Pulse Rate 03/05/18 0952 76     Resp 03/05/18 0952 20     Temp 03/05/18 0952 99.1 F (37.3 C)     Temp Source 03/05/18 0952 Oral     SpO2 03/05/18 0952 97 %     Weight 03/05/18 0953 142 lb (64.4 kg)     Height 03/05/18 0953 5\' 2"  (1.575 m)     Head Circumference --      Peak Flow --      Pain Score 03/05/18 0952 5     Pain Loc --      Pain Edu? --      Excl. in GC? --    Constitutional: Alert and oriented. Well appearing and in no distress. Eyes: Conjunctivae are normal. Normal extraocular movements. Cardiovascular: Normal rate, regular rhythm. No murmurs, rubs, or gallops. Respiratory: Normal respiratory effort without tachypnea nor retractions. Breath sounds are clear and equal bilaterally. No wheezes/rales/rhonchi. Gastrointestinal: Lower abdominal tenderness that is nonfocal, no rebound or guarding, normal bowel sounds Rectal: Bright red blood is noted, no hemorrhoids Musculoskeletal: Nontender with normal range of motion in extremities. No lower extremity tenderness nor edema. Neurologic:  Normal speech and language. No  gross focal neurologic deficits are appreciated.  Skin:  Skin is warm, dry and intact. No rash noted. Psychiatric: Mood and affect are normal. Speech and behavior are normal.  ____________________________________________  ED COURSE:  As part of my medical decision making, I reviewed the following data within the electronic MEDICAL RECORD NUMBER History obtained from family if available, nursing notes, old chart and ekg, as well as notes from prior ED  visits. Patient presented for rectal bleeding, we will assess with labs and imaging as indicated at this time.   Procedures ____________________________________________   LABS (pertinent positives/negatives)  Labs Reviewed  COMPREHENSIVE METABOLIC PANEL - Abnormal; Notable for the following components:      Result Value   Potassium 3.3 (*)    Glucose, Bld 135 (*)    All other components within normal limits  LIPID PANEL - Abnormal; Notable for the following components:   Triglycerides 165 (*)    HDL 35 (*)    All other components within normal limits  CBC WITH DIFFERENTIAL/PLATELET  LIPASE, BLOOD  TROPONIN I  PROTIME-INR  URINALYSIS, COMPLETE (UACMP) WITH MICROSCOPIC  TYPE AND SCREEN    RADIOLOGY Images were viewed by me  CT the abdomen pelvis with contrast IMPRESSION: Severe descending colonic and sigmoid diverticulosis. Slight stranding around the distal descending colon could reflect early active diverticulitis.  Moderate stool burden throughout the colon.  Prior cholecystectomy and hysterectomy.  Cardiomegaly.  Severe thoracolumbar scoliosis. ____________________________________________  DIFFERENTIAL DIAGNOSIS   Diverticulosis, diverticulitis, internal hemorrhoid, colon cancer  FINAL ASSESSMENT AND PLAN  Rectal bleeding   Plan: The patient had presented for rectal bleeding. Patient's labs were unremarkable. Patient's imaging did reveal significant diverticulosis which is likely the cause of her bleeding.  There is the possibility of diverticulitis although I think this is less likely.  She has had more episodes of rectal bleeding here.  I will discuss with the hospitalist for admission.   Ulice Dash, MD   Note: This note was generated in part or whole with voice recognition software. Voice recognition is usually quite accurate but there are transcription errors that can and very often do occur. I apologize for any typographical errors  that were not detected and corrected.     Emily Filbert, MD 03/05/18 1149

## 2018-03-06 DIAGNOSIS — K921 Melena: Secondary | ICD-10-CM | POA: Diagnosis not present

## 2018-03-06 DIAGNOSIS — K625 Hemorrhage of anus and rectum: Secondary | ICD-10-CM

## 2018-03-06 LAB — CBC
HEMATOCRIT: 36 % (ref 35.0–47.0)
Hemoglobin: 12.4 g/dL (ref 12.0–16.0)
MCH: 30.2 pg (ref 26.0–34.0)
MCHC: 34.4 g/dL (ref 32.0–36.0)
MCV: 87.9 fL (ref 80.0–100.0)
PLATELETS: 202 10*3/uL (ref 150–440)
RBC: 4.1 MIL/uL (ref 3.80–5.20)
RDW: 13.7 % (ref 11.5–14.5)
WBC: 6.6 10*3/uL (ref 3.6–11.0)

## 2018-03-06 MED ORDER — HYDRALAZINE HCL 20 MG/ML IJ SOLN: 10.0000 mg | INTRAMUSCULAR | Status: DC | PRN

## 2018-03-06 MED ORDER — PEG 3350-KCL-NA BICARB-NACL 420 G PO SOLR
4000.0000 mL | Freq: Once | ORAL | Status: AC
  Administered 2018-03-06: 4000 mL via ORAL
  Filled 2018-03-06 (×3): qty 4000

## 2018-03-06 NOTE — Consult Note (Signed)
Michelle Miniumarren Kynsley Whitehouse, MD [facility]  [address] [place], [place] [address] Phone: [phone] Fax : [phone]  Consultation  Referring Provider:     No ref. provider found Primary Care Physician:  Lauro RegulusAnderson, Marshall W, MD Primary Gastroenterologist: Gentry FitzUnassigned         Reason for Consultation:     Hematochezia  Date of Admission:  03/05/2018 Date of Consultation:  03/06/2018         HPI:   Michelle Bauer is a 77 y.o. female reports that she started to have rectal bleeding about 2:00 yesterday and that brought to the hospital.  The patient denies ever having rectal bleeding in the past.  The patient also reports that she had a colonoscopy over 10 years ago.  There is no report of any abdominal pain with rectal bleeding but she did have some crampy lower abdominal pain after the bleeding it started.  The patient denies any shortness of breath black stools or nausea vomiting.  She stated that she had multiple episodes of rectal bleeding which concerned her and therefore she came to the hospital.  The patient also reports that her bleeding has slowed down and she has not seen any further bleeding today.  On admission the patient's hemoglobin was 13.2 with her hemoglobin of 12.4 this morning.  Past Medical History:  Diagnosis Date  . Anxiety   . Depression   . Essential hypertension   . Hyperlipidemia     Past Surgical History:  Procedure Laterality Date  . ABDOMINAL HYSTERECTOMY    . BYPASS AXILLA/BRACHIAL ARTERY    . CHOLECYSTECTOMY      Prior to Admission medications   Medication Sig Start Date End Date Taking? Authorizing Provider  aspirin 325 MG tablet Take 325 mg by mouth daily.   Yes [provider]  atorvastatin (LIPITOR) 40 MG tablet Take 40 mg by mouth daily with supper.    Yes [provider]  clonazePAM (KLONOPIN) 0.5 MG tablet Take 1 tablet (0.5 mg total) by mouth 4 (four) times daily. 09/25/17  Yes Clapacs, Jackquline DenmarkJohn T, MD  escitalopram (LEXAPRO) 5 MG  tablet Take 1 tablet (5 mg total) by mouth daily. 09/25/17  Yes Clapacs, Jackquline DenmarkJohn T, MD  hydrochlorothiazide (HYDRODIURIL) 25 MG tablet Take 25 mg by mouth daily.    Yes [provider]  metoprolol succinate (TOPROL-XL) 100 MG 24 hr tablet Take 50-100 mg by mouth See admin instructions. 100 mg every morning and 50 mg at bedtime   Yes [provider]  Multiple Vitamins-Minerals (ONE-A-DAY WOMENS 50 PLUS) TABS Take 1 tablet by mouth daily.   Yes [provider]  omega-3 acid ethyl esters (LOVAZA) 1 g capsule Take 1 g by mouth daily with lunch.   Yes [provider]  Potassium Gluconate 550 MG TABS Take 1,100 mg by mouth daily.   Yes [provider]  traMADol (ULTRAM) 50 MG tablet Take 100 mg by mouth 2 (two) times daily.    Yes Clapacs, Jackquline DenmarkJohn T, MD    Family History  Problem Relation Age of Onset  . Stroke Mother   . Heart attack Father   . Alcohol abuse Father   . Stroke Sister   . Diabetes Brother   . Colon cancer Sister      Social History   Tobacco Use  . Smoking status: Never Smoker  . Smokeless tobacco: Never Used  Substance Use Topics  . Alcohol use: No    Alcohol/week: 0.0 standard drinks  .  Drug use: No    Allergies as of 03/05/2018 - Review Complete 03/05/2018  Allergen Reaction Noted  . Other Other (See Comments) 11/08/2015  . Penicillins Other (See Comments) 11/08/2015  . Sulfa antibiotics Other (See Comments) 03/01/2014    Review of Systems:    All systems reviewed and negative except where noted in HPI.   Physical Exam:  Vital signs in last 24 hours: Temp:  [97.7 F (36.5 C)-98.3 F (36.8 C)] 97.7 F (36.5 C) (09/18 0700) Pulse Rate:  [52-69] 57 (09/18 0911) Resp:  [17-18] 17 (09/18 0700) BP: (128-157)/(46-80) 157/55 (09/18 0911) SpO2:  [92 %-100 %] 97 % (09/18 0911) Last BM Date: 03/06/18 General:   Pleasant, cooperative in NAD Head:  Normocephalic and atraumatic. Eyes:   No icterus.   Conjunctiva pink.  PERRLA. Ears:  Normal auditory acuity. Neck:  Supple; no masses or thyroidomegaly Lungs: Respirations even and unlabored. Lungs clear to auscultation bilaterally.   No wheezes, crackles, or rhonchi.  Heart:  Regular rate and rhythm;  Without murmur, clicks, rubs or gallops Abdomen:  Soft, nondistended, nontender. Normal bowel sounds. No appreciable masses or hepatomegaly.  No rebound or guarding.  Rectal:  Not performed. Msk:  Symmetrical without gross deformities.    Extremities:  Without edema, cyanosis or clubbing. Neurologic:  Alert and oriented x3;  grossly normal neurologically. Skin:  Intact without significant lesions or rashes. Cervical Nodes:  No significant cervical adenopathy. Psych:  Alert and cooperative. Normal affect.  LAB RESULTS: Recent Labs    03/05/18 1019 03/05/18 1521 03/05/18 2142 03/06/18 0255  WBC 7.2  --   --  6.6  HGB 13.2 13.2 12.7 12.4  HCT 39.9  --   --  36.0  PLT 218  --   --  202   BMET Recent Labs    03/05/18 1019  NA 138  K 3.3*  CL 99  CO2 28  GLUCOSE 135*  BUN 16  CREATININE 0.74  CALCIUM 9.1   LFT Recent Labs    03/05/18 1019  PROT 7.5  ALBUMIN 3.6  AST 33  ALT 25  ALKPHOS 80  BILITOT 0.6   PT/INR Recent Labs    03/05/18 1019  LABPROT 13.4  INR 1.03    STUDIES: Ct Abdomen Pelvis W Contrast  Result Date: 03/05/2018 CLINICAL DATA:  Rectal bleeding, abdominal pain. EXAM: CT ABDOMEN AND PELVIS WITH CONTRAST TECHNIQUE: Multidetector CT imaging of the abdomen and pelvis was performed using the standard protocol following bolus administration of intravenous contrast. CONTRAST:  ISOVUE-300 IOPAMIDOL (ISOVUE-300) INJECTION 61% COMPARISON:  None. FINDINGS: Lower chest: Cardiomegaly.  No acute abnormality. Hepatobiliary: No focal liver abnormality is seen. Status post cholecystectomy. No biliary dilatation. Pancreas: No focal abnormality or ductal dilatation. Spleen: No focal abnormality.  Normal size. Adrenals/Urinary  Tract: No adrenal abnormality. No focal renal abnormality. No stones or hydronephrosis. Urinary bladder is unremarkable. Stomach/Bowel: Severe diverticulosis in the descending colon and sigmoid colon. Slight haziness adjacent to the distal descending colon could reflect early active diverticulitis. No evidence of bowel obstruction. Moderate stool burden. Appendix is normal. Vascular/Lymphatic: Aortic atherosclerosis. No enlarged abdominal or pelvic lymph nodes. Reproductive: Prior hysterectomy.  No adnexal masses. Other: No free fluid or free air. Musculoskeletal: Severe thoracolumbar scoliosis. No acute bony abnormality. IMPRESSION: Severe descending colonic and sigmoid diverticulosis. Slight stranding around the distal descending colon could reflect early active diverticulitis. Moderate stool burden throughout the colon. Prior cholecystectomy and hysterectomy. Cardiomegaly. Severe thoracolumbar scoliosis. Electronically Signed   By: Caryn Bee  Dover M.D.   On: 03/05/2018 11:44      Impression / Plan:   Assessment: Active Problems:   GI bleed   Michelle Bauer is a 77 y.o. y/o female with hematochezia that has since resolved with a stable hemoglobin.  The patient's hemoglobin was 13.2 when admitted and is 12.4 this morning.  Plan:  The patient likely has a diverticular bleed and less likely has ischemic colitis.  The patient does not have any appreciable abdominal pain to indicate ischemic bowel disease and she does not have chronic anemia to indicate any sign of malignancy.  There is also no report of any unexplained weight loss.  The patient may have hemorrhoidal bleeding as the cause of her hematochezia or also.  The patient will be set up for a colonoscopy for tomorrow and be prepped today.  The patient would rather do the procedures while still in the hospital opposed to being discharged and having it done as an outpatient. I have discussed risks & benefits which include, but are not limited to,  bleeding, infection, perforation & drug reaction.  The patient agrees with this plan & written consent will be obtained.     Thank you for involving me in the care of this patient.      LOS: 0 days   Michelle Minium, MD  03/06/2018, 11:16 AM    Note: This dictation was prepared with Dragon dictation along with smaller phrase technology. Any transcriptional errors that result from this process are unintentional.

## 2018-03-06 NOTE — Progress Notes (Signed)
[employer] - [place] at [facility]   PATIENT NAME: Michelle Bauer    MR#:  [id number]  DATE OF BIRTH:  1940-06-30  SUBJECTIVE:  CHIEF COMPLAINT:   Chief Complaint  Patient presents with  . Rectal Bleeding  Patient without complaint, daughters at the bedside, await further gastroenterology recommendations, hydralazine as needed systolic blood pressure greater than 160  REVIEW OF SYSTEMS:  CONSTITUTIONAL: No fever, fatigue or weakness.  EYES: No blurred or double vision.  EARS, NOSE, AND THROAT: No tinnitus or ear pain.  RESPIRATORY: No cough, shortness of breath, wheezing or hemoptysis.  CARDIOVASCULAR: No chest pain, orthopnea, edema.  GASTROINTESTINAL: No nausea, vomiting, diarrhea or abdominal pain.  GENITOURINARY: No dysuria, hematuria.  ENDOCRINE: No polyuria, nocturia,  HEMATOLOGY: No anemia, easy bruising or bleeding SKIN: No rash or lesion. MUSCULOSKELETAL: No joint pain or arthritis.   NEUROLOGIC: No tingling, numbness, weakness.  PSYCHIATRY: No anxiety or depression.   ROS  DRUG ALLERGIES:   Allergies  Allergen Reactions  . Other Other (See Comments)  . Penicillins Other (See Comments)    Has patient had a PCN reaction causing immediate rash, facial/tongue/throat swelling, SOB or lightheadedness with hypotension: Unknown Has patient had a PCN reaction causing severe rash involving mucus membranes or skin necrosis: Unknown Has patient had a PCN reaction that required hospitalization: Unknown Has patient had a PCN reaction occurring within the last 10 years: No If all of the above answers are "NO", then may proceed with Cephalosporin use.  . Sulfa Antibiotics Other (See Comments)    VITALS:  Blood pressure (!) 157/55, pulse (!) 57, temperature 97.7 F (36.5 C), temperature source Oral, resp. rate 17, height 5\' 2"  (1.575 m), weight 64.4 kg, SpO2 97 %.  PHYSICAL EXAMINATION:  GENERAL:  77 y.o.-year-old patient lying in the bed with no acute  distress.  EYES: Pupils equal, round, reactive to light and accommodation. No scleral icterus. Extraocular muscles intact.  HEENT: Head atraumatic, normocephalic. Oropharynx and nasopharynx clear.  NECK:  Supple, no jugular venous distention. No thyroid enlargement, no tenderness.  LUNGS: Normal breath sounds bilaterally, no wheezing, rales,rhonchi or crepitation. No use of accessory muscles of respiration.  CARDIOVASCULAR: S1, S2 normal. No murmurs, rubs, or gallops.  ABDOMEN: Soft, nontender, nondistended. Bowel sounds present. No organomegaly or mass.  EXTREMITIES: No pedal edema, cyanosis, or clubbing.  NEUROLOGIC: Cranial nerves II through XII are intact. Muscle strength 5/5 in all extremities. Sensation intact. Gait not checked.  PSYCHIATRIC: The patient is alert and oriented x 3.  SKIN: No obvious rash, lesion, or ulcer.   Physical Exam LABORATORY PANEL:   CBC Recent Labs  Lab 03/06/18 0255  WBC 6.6  HGB 12.4  HCT 36.0  PLT 202   ------------------------------------------------------------------------------------------------------------------  Chemistries  Recent Labs  Lab 03/05/18 1019  NA 138  K 3.3*  CL 99  CO2 28  GLUCOSE 135*  BUN 16  CREATININE 0.74  CALCIUM 9.1  AST 33  ALT 25  ALKPHOS 80  BILITOT 0.6   ------------------------------------------------------------------------------------------------------------------  Cardiac Enzymes Recent Labs  Lab 03/05/18 1019  TROPONINI <0.03   ------------------------------------------------------------------------------------------------------------------  RADIOLOGY:  Ct Abdomen Pelvis Bauer Contrast  Result Date: 03/05/2018 CLINICAL DATA:  Rectal bleeding, abdominal pain. EXAM: CT ABDOMEN AND PELVIS WITH CONTRAST TECHNIQUE: Multidetector CT imaging of the abdomen and pelvis was performed using the standard protocol following bolus administration of intravenous contrast. CONTRAST:  ISOVUE-300 IOPAMIDOL  (ISOVUE-300) INJECTION 61% COMPARISON:  None. FINDINGS: Lower chest: Cardiomegaly.  No acute abnormality.  Hepatobiliary: No focal liver abnormality is seen. Status post cholecystectomy. No biliary dilatation. Pancreas: No focal abnormality or ductal dilatation. Spleen: No focal abnormality.  Normal size. Adrenals/Urinary Tract: No adrenal abnormality. No focal renal abnormality. No stones or hydronephrosis. Urinary bladder is unremarkable. Stomach/Bowel: Severe diverticulosis in the descending colon and sigmoid colon. Slight haziness adjacent to the distal descending colon could reflect early active diverticulitis. No evidence of bowel obstruction. Moderate stool burden. Appendix is normal. Vascular/Lymphatic: Aortic atherosclerosis. No enlarged abdominal or pelvic lymph nodes. Reproductive: Prior hysterectomy.  No adnexal masses. Other: No free fluid or free air. Musculoskeletal: Severe thoracolumbar scoliosis. No acute bony abnormality. IMPRESSION: Severe descending colonic and sigmoid diverticulosis. Slight stranding around the distal descending colon could reflect early active diverticulitis. Moderate stool burden throughout the colon. Prior cholecystectomy and hysterectomy. Cardiomegaly. Severe thoracolumbar scoliosis. Electronically Signed   By: Charlett NoseKevin  Dover M.D.   On: 03/05/2018 11:44    ASSESSMENT AND PLAN:  77 year old female with past medical history of hypertension, hyperlipidemia, anxiety/depression, severe scoliosis who presents to the hospital due to multiple episodes of rectal bleeding.  *Acute probable diverticular bleeding  Await further gastroenterology recommendations, possible endoscopy on tomorrow given no prep overnight, CBC daily and transfuse as needed   *Chronic benign essential hypertension  Continue metoprolol, hydrochlorothiazide held, IV hydralazine as needed systolic blood pressure greater than 160, vitals per routine, make changes as per necessary   *Chronic  depression/anxiety  Stable  Continue Klonopin, Lexapro  *Chronic hyperlipidemia, unspecified Continue statin therapy   *Chronic Severe Scoliosis Stable on tramadol as needed  All the records are reviewed and case discussed with Care Management/Social Workerr. Management plans discussed with the patient, family and they are in agreement.  CODE STATUS: dnr  TOTAL TIME TAKING CARE OF THIS PATIENT: 45 minutes.     POSSIBLE D/C IN 1-2 DAYS, DEPENDING ON CLINICAL CONDITION.   Michelle Bauer M.D on 03/06/2018   Between 7am to 6pm - Pager - [phone]  After 6pm go to [email or web address] - Social research officer, governmentpassword [email or web address]  [facility] [place] [facility]  Office  [phone]  CC: Primary care physician; Michelle Bauer, Michelle W, MD  Note: This dictation was prepared with Dragon dictation along with smaller phrase technology. Any transcriptional errors that result from this process are unintentional.

## 2018-03-06 NOTE — Care Management Important Message (Signed)
Important Message  Patient Details  Name: Renae GlossDorothy S Nordstrom MRN: [id number] Date of Birth: 06/21/1940   Medicare Important Message Given:  Yes    Gwenette GreetBrenda S Kavonte Bearse, RN 03/06/2018, 10:19 AM

## 2018-03-06 NOTE — Care Management Note (Signed)
Case Management Note  Patient Details  Name: Michelle GlossDorothy S Kovacevic MRN: [id number] Date of Birth: 09/21/1940  Subjective/Objective:    [facility] under observation status. Lives alone. Daughter is Human resources officerKellie. Sees Dr. Dareen PianoAnderson every 6 months. [facility] [address]. No home health. No skilled facility. No home oxygen. No medical equipment in the home. Takes care of all basic activities of daily living herself, doesn't drive. Good family support. Last fall was 6 months ago. Fair appetite                Action/Plan: No discharge needs identified at this time,  Will continue to follow   Expected Discharge Date:                  Expected Discharge Plan:     In-House Referral:     Discharge planning Services     Post Acute Care Choice:    Choice offered to:     DME Arranged:    DME Agency:     HH Arranged:    HH Agency:     Status of Service:     If discussed at [employer] Meetings, dates discussed:    Additional Comments:  Gwenette GreetBrenda S Kaheem Halleck, RN MSN CCM Care Management [phone] 03/06/2018, 11:02 AM

## 2018-03-07 ENCOUNTER — Observation Stay: Payer: Medicare Other | Admitting: Anesthesiology

## 2018-03-07 ENCOUNTER — Encounter
Admission: EM | Disposition: A | Discharge status: Home / Self Care | Payer: Self-pay | Source: Home / Self Care | Attending: Emergency Medicine

## 2018-03-07 ENCOUNTER — Other Ambulatory Visit: Payer: Self-pay | Admitting: Psychiatry

## 2018-03-07 DIAGNOSIS — K921 Melena: Secondary | ICD-10-CM | POA: Diagnosis not present

## 2018-03-07 HISTORY — PX: COLONOSCOPY WITH PROPOFOL: SHX5780

## 2018-03-07 LAB — CBC
HCT: 37.2 % (ref 35.0–47.0)
HEMOGLOBIN: 12.5 g/dL (ref 12.0–16.0)
MCH: 29.6 pg (ref 26.0–34.0)
MCHC: 33.6 g/dL (ref 32.0–36.0)
MCV: 88.1 fL (ref 80.0–100.0)
PLATELETS: 187 10*3/uL (ref 150–440)
RBC: 4.22 MIL/uL (ref 3.80–5.20)
RDW: 14.2 % (ref 11.5–14.5)
WBC: 4.9 10*3/uL (ref 3.6–11.0)

## 2018-03-07 SURGERY — COLONOSCOPY WITH PROPOFOL
Anesthesia: General

## 2018-03-07 MED ORDER — LIDOCAINE HCL (CARDIAC) PF 100 MG/5ML IV SOSY
PREFILLED_SYRINGE | INTRAVENOUS | Status: DC | PRN
  Administered 2018-03-07: 40 mg via INTRAVENOUS

## 2018-03-07 MED ORDER — PROPOFOL 500 MG/50ML IV EMUL
INTRAVENOUS | Status: DC | PRN
  Administered 2018-03-07: 150 ug/kg/min via INTRAVENOUS

## 2018-03-07 MED ORDER — SODIUM CHLORIDE 0.9 % IV SOLN
INTRAVENOUS | Status: DC | PRN
  Administered 2018-03-07: 12:00:00 via INTRAVENOUS

## 2018-03-07 MED ORDER — PROPOFOL 10 MG/ML IV BOLUS
INTRAVENOUS | Status: DC | PRN
  Administered 2018-03-07: 70 mg via INTRAVENOUS

## 2018-03-07 MED ORDER — PROPOFOL 10 MG/ML IV BOLUS
INTRAVENOUS | Status: AC
  Filled 2018-03-07: qty 20

## 2018-03-07 NOTE — Transfer of Care (Signed)
Immediate Anesthesia Transfer of Care Note  Patient: Michelle Bauer  Procedure(s) Performed: Procedure(s): COLONOSCOPY WITH PROPOFOL (N/A)  Patient Location: PACU and Endoscopy Unit  Anesthesia Type:General  Level of Consciousness: sedated  Airway & Oxygen Therapy: Patient Spontanous Breathing and Patient connected to nasal cannula oxygen  Post-op Assessment: Report given to RN and Post -op Vital signs reviewed and stable  Post vital signs: Reviewed and stable  Last Vitals:  Vitals:   03/07/18 0727 03/07/18 1209  BP: 131/68 112/62  Pulse: 68 67  Resp:  (!) 22  Temp: 36.6 C 36.9 C  SpO2: 96% 98%    Complications: No apparent anesthesia complications

## 2018-03-07 NOTE — Progress Notes (Signed)
Discharge summary reviewed with verbal understanding. Answered all questions. Escorted to personal vehicle via wc. 

## 2018-03-07 NOTE — Op Note (Signed)
[facility] Gastroenterology Patient Name: Michelle RobinsonsDorothy Schanz Procedure Date: 03/07/2018 11:44 AM MRN: [id number] Account #: [id number] Date of Birth: [birth year] Admit Type: Inpatient Age: [age] [facility] Gender: Female Note Status: Finalized Procedure:            Colonoscopy Indications:          Hematochezia Providers:            Midge Miniumarren Carsen Machi MD, MD Referring MD:         Marya AmslerMarshall W. Dareen PianoAnderson MD, MD (Referring MD) Medicines:            Propofol per Anesthesia Complications:        No immediate complications. Procedure:            Pre-Anesthesia Assessment:                       - Prior to the procedure, a History and Physical was                        performed, and patient medications and allergies were                        reviewed. The patient's tolerance of previous                        anesthesia was also reviewed. The risks and benefits of                        the procedure and the sedation options and risks were                        discussed with the patient. All questions were                        answered, and informed consent was obtained. Prior                        Anticoagulants: The patient has taken no previous                        anticoagulant or antiplatelet agents. ASA Grade                        Assessment: II - A patient with mild systemic disease.                        After reviewing the risks and benefits, the patient was                        deemed in satisfactory condition to undergo the                        procedure.                       After obtaining informed consent, the colonoscope was                        passed under direct vision. Throughout the procedure,  the patient's blood pressure, pulse, and oxygen                        saturations were monitored continuously. The                        Colonoscope was introduced through the anus and                        advanced to  the the cecum, identified by appendiceal                        orifice and ileocecal valve. The colonoscopy was                        performed without difficulty. The patient tolerated the                        procedure well. The quality of the bowel preparation                        was excellent. Findings:      The perianal and digital rectal examinations were normal.      Multiple small-mouthed diverticula were found in the sigmoid colon. Impression:           - Diverticulosis in the sigmoid colon.                       - No specimens collected. Recommendation:       - Discharge patient to home.                       - Resume previous diet.                       - Continue present medications. Procedure Code(s):    --- Professional ---                       [phone], Colonoscopy, flexible; diagnostic, including                        collection of specimen(s) by brushing or washing, when                        performed (separate procedure) Diagnosis Code(s):    --- Professional ---                       K92.1, Melena (includes Hematochezia) CPT copyright 2017 American Medical Association. All rights reserved. The codes documented in this report are preliminary and upon coder review may  be revised to meet current compliance requirements. Midge Minium MD, MD 03/07/2018 12:03:46 PM This report has been signed electronically. Number of Addenda: 0 Note Initiated On: 03/07/2018 11:44 AM Scope Withdrawal Time: 0 hours 6 minutes 1 second  Total Procedure Duration: 0 hours 13 minutes 4 seconds       [facility]

## 2018-03-07 NOTE — Anesthesia Post-op Follow-up Note (Signed)
Anesthesia QCDR form completed.        

## 2018-03-07 NOTE — Anesthesia Procedure Notes (Signed)
Date/Time: 03/07/2018 11:45 AM Performed by: Stormy Fabianurtis, Kailon Treese, CRNA Pre-anesthesia Checklist: Patient identified, Emergency Drugs available, Suction available and Patient being monitored Patient Re-evaluated:Patient Re-evaluated prior to induction Oxygen Delivery Method: Nasal cannula Induction Type: IV induction Dental Injury: Teeth and Oropharynx as per pre-operative assessment  Comments: Nasal cannula with etCO2 monitoring

## 2018-03-07 NOTE — Discharge Summary (Signed)
[facility] - [place] at [facility]   PATIENT NAME: Michelle Bauer    MR#:  [id number]  DATE OF BIRTH:  January 30, 1941  DATE OF ADMISSION:  03/05/2018 ADMITTING PHYSICIAN: Houston Siren, MD  DATE OF DISCHARGE: No discharge date for patient encounter.  PRIMARY CARE PHYSICIAN: [name], MD    ADMISSION DIAGNOSIS:  Diverticulosis [K57.90] Rectal bleeding [K62.5]  DISCHARGE DIAGNOSIS:  Active Problems:   GI bleed   Rectal bleeding   Hematochezia   SECONDARY DIAGNOSIS:   Past Medical History:  Diagnosis Date  . Anxiety   . Depression   . Essential hypertension   . Hyperlipidemia     HOSPITAL COURSE:  77 year old female with past medical history of hypertension, hyperlipidemia, anxiety/depression, severe scoliosis who presents to the hospital due to multiple episodes of rectal bleeding.  *Acute probable diverticular bleeding  Resolved Status post colonoscopy by gastroenterology noted for diverticulosis-deemed appropriate for discharge to home with outpatient follow-up given no active bleeding noted, hemoglobin stable  *Chronic benign essential hypertension  Stable on current regiment  *Chronic depression/anxiety  Stable  Continued Klonopin, Lexapro  *Chronic hyperlipidemia, unspecified Continued statin therapy   *Chronic Severe Scoliosis Stable on tramadol  DISCHARGE CONDITIONS:   stable  CONSULTS OBTAINED:  Treatment Team:  Midge Minium, MD  DRUG ALLERGIES:   Allergies  Allergen Reactions  . Other Other (See Comments)  . Penicillins Other (See Comments)    Has patient had a PCN reaction causing immediate rash, facial/tongue/throat swelling, SOB or lightheadedness with hypotension: Unknown Has patient had a PCN reaction causing severe rash involving mucus membranes or skin necrosis: Unknown Has patient had a PCN reaction that required hospitalization: Unknown Has patient had a PCN reaction occurring within the last  10 years: No If all of the above answers are "NO", then may proceed with Cephalosporin use.  . Sulfa Antibiotics Other (See Comments)    DISCHARGE MEDICATIONS:   Allergies as of 03/07/2018      Reactions   Other Other (See Comments)   Penicillins Other (See Comments)   Has patient had a PCN reaction causing immediate rash, facial/tongue/throat swelling, SOB or lightheadedness with hypotension: Unknown Has patient had a PCN reaction causing severe rash involving mucus membranes or skin necrosis: Unknown Has patient had a PCN reaction that required hospitalization: Unknown Has patient had a PCN reaction occurring within the last 10 years: No If all of the above answers are "NO", then may proceed with Cephalosporin use.   Sulfa Antibiotics Other (See Comments)      Medication List    TAKE these medications   aspirin 325 MG tablet Take 325 mg by mouth daily.   atorvastatin 40 MG tablet Commonly known as:  LIPITOR Take 40 mg by mouth daily with supper.   clonazePAM 0.5 MG tablet Commonly known as:  KLONOPIN Take 1 tablet (0.5 mg total) by mouth 4 (four) times daily.   escitalopram 5 MG tablet Commonly known as:  LEXAPRO Take 1 tablet (5 mg total) by mouth daily.   hydrochlorothiazide 25 MG tablet Commonly known as:  HYDRODIURIL Take 25 mg by mouth daily.   metoprolol succinate 100 MG 24 hr tablet Commonly known as:  TOPROL-XL Take 50-100 mg by mouth See admin instructions. 100 mg every morning and 50 mg at bedtime   omega-3 acid ethyl esters 1 g capsule Commonly known as:  LOVAZA Take 1 g by mouth daily with lunch.   ONE-A-DAY WOMENS 50 PLUS Tabs Take 1  tablet by mouth daily.   Potassium Gluconate 550 MG Tabs Take 1,100 mg by mouth daily.   traMADol 50 MG tablet Commonly known as:  ULTRAM Take 100 mg by mouth 2 (two) times daily.        DISCHARGE INSTRUCTIONS:  If you experience worsening of your admission symptoms, develop shortness of breath, life  threatening emergency, suicidal or homicidal thoughts you must seek medical attention immediately by calling 911 or calling your MD immediately  if symptoms less severe.  You Must read complete instructions/literature along with all the possible adverse reactions/side effects for all the Medicines you take and that have been prescribed to you. Take any new Medicines after you have completely understood and accept all the possible adverse reactions/side effects.   Please note  You were cared for by a hospitalist during your hospital stay. If you have any questions about your discharge medications or the care you received while you were in the hospital after you are discharged, you can call the unit and asked to speak with the hospitalist on call if the hospitalist that took care of you is not available. Once you are discharged, your primary care physician will handle any further medical issues. Please note that NO REFILLS for any discharge medications will be authorized once you are discharged, as it is imperative that you return to your primary care physician (or establish a relationship with a primary care physician if you do not have one) for your aftercare needs so that they can reassess your need for medications and monitor your lab values.    Today   CHIEF COMPLAINT:   Chief Complaint  Patient presents with  . Rectal Bleeding    HISTORY OF PRESENT ILLNESS:  77 y.o. female with a known history of hypertension, hyperlipidemia, anxiety/depression, severe scoliosis who presents to the hospital due to multiple episodes of rectal bleeding.  Patient says she was in her usual state of health when 2:00 this morning she woke up and started having bloody stools.  Patient says her stools initially had some brown stool mixed with fresh blood but this morning is been mostly fresh blood with some clots.  She came to the ER and has had multiple episodes of rectal bleeding.  Her hemoglobin is stable, but  given her ongoing rectal bleeding hospitalist services were contacted for admission.  Patient had some abdominal cramping earlier but it has resolved now, she denies any fevers chills chest pain shortness of breath or any other associated symptoms.    VITAL SIGNS:  Blood pressure 124/62, pulse 60, temperature 98.4 F (36.9 C), temperature source Tympanic, resp. rate (!) 24, height 5\' 2"  (1.575 m), weight 64.4 kg, SpO2 98 %.  I/O:    Intake/Output Summary (Last 24 hours) at 03/07/2018 1230 Last data filed at 03/07/2018 1206 Gross per 24 hour  Intake 300 ml  Output -  Net 300 ml    PHYSICAL EXAMINATION:  GENERAL:  77 y.o.-year-old patient lying in the bed with no acute distress.  EYES: Pupils equal, round, reactive to light and accommodation. No scleral icterus. Extraocular muscles intact.  HEENT: Head atraumatic, normocephalic. Oropharynx and nasopharynx clear.  NECK:  Supple, no jugular venous distention. No thyroid enlargement, no tenderness.  LUNGS: Normal breath sounds bilaterally, no wheezing, rales,rhonchi or crepitation. No use of accessory muscles of respiration.  CARDIOVASCULAR: S1, S2 normal. No murmurs, rubs, or gallops.  ABDOMEN: Soft, non-tender, non-distended. Bowel sounds present. No organomegaly or mass.  EXTREMITIES: No pedal edema,  cyanosis, or clubbing.  NEUROLOGIC: Cranial nerves II through XII are intact. Muscle strength 5/5 in all extremities. Sensation intact. Gait not checked.  PSYCHIATRIC: The patient is alert and oriented x 3.  SKIN: No obvious rash, lesion, or ulcer.   DATA REVIEW:   CBC Recent Labs  Lab 03/07/18 0844  WBC 4.9  HGB 12.5  HCT 37.2  PLT 187    Chemistries  Recent Labs  Lab 03/05/18 1019  NA 138  K 3.3*  CL 99  CO2 28  GLUCOSE 135*  BUN 16  CREATININE 0.74  CALCIUM 9.1  AST 33  ALT 25  ALKPHOS 80  BILITOT 0.6    Cardiac Enzymes Recent Labs  Lab 03/05/18 1019  TROPONINI <0.03    Microbiology Results  No  results found for this or any previous visit.  RADIOLOGY:  No results found.  EKG:   Orders placed or performed during the hospital encounter of 03/05/18  . EKG 12-Lead  . EKG 12-Lead      Management plans discussed with the patient, family and they are in agreement.  CODE STATUS:     Code Status Orders  (From admission, onward)         Start     Ordered   03/05/18 1454  Do not attempt resuscitation (DNR)  Continuous    Question Answer Comment  In the event of cardiac or respiratory ARREST Do not call a "code blue"   In the event of cardiac or respiratory ARREST Do not perform Intubation, CPR, defibrillation or ACLS   In the event of cardiac or respiratory ARREST Use medication by any route, position, wound care, and other measures to relive pain and suffering. May use oxygen, suction and manual treatment of airway obstruction as needed for comfort.      03/05/18 1454        Code Status History    This patient has a current code status but no historical code status.    Advance Directive Documentation     Most Recent Value  Type of Advance Directive  Healthcare Power of Attorney  Pre-existing out of facility DNR order (yellow form or pink MOST form)  -  "MOST" Form in Place?  -      TOTAL TIME TAKING CARE OF THIS PATIENT: 40 minutes.    Evelena AsaMontell D Quiana Cobaugh M.D on 03/07/2018 at 12:30 PM  Between 7am to 6pm - Pager - [phone]  After 6pm go to [email or web address] - Social research officer, [email or web address] [email or web address]  [facility] [place] [facility]  Office  [phone]  CC: Primary care physician; Lauro RegulusAnderson, Marshall W, MD   Note: This dictation was prepared with Dragon dictation along with smaller phrase technology. Any transcriptional errors that result from this process are unintentional.

## 2018-03-07 NOTE — Anesthesia Preprocedure Evaluation (Signed)
Anesthesia Evaluation  Patient identified by MRN, date of birth, ID band Patient awake    Reviewed: Allergy & Precautions, NPO status , Patient's Chart, lab work & pertinent test results  History of Anesthesia Complications Negative for: history of anesthetic complications  Airway Mallampati: II       Dental   Pulmonary neg sleep apnea, neg COPD,           Cardiovascular hypertension, Pt. on medications + CABG  (-) Past MI and (-) CHF (-) dysrhythmias (-) Valvular Problems/Murmurs     Neuro/Psych neg Seizures Anxiety Depression    GI/Hepatic Neg liver ROS, neg GERD  ,  Endo/Other  neg diabetes  Renal/GU negative Renal ROS     Musculoskeletal   Abdominal   Peds  Hematology   Anesthesia Other Findings   Reproductive/Obstetrics                             Anesthesia Physical Anesthesia Plan  ASA: III  Anesthesia Plan: General   Post-op Pain Management:    Induction: Intravenous  PONV Risk Score and Plan: 3 and TIVA, Propofol infusion and Treatment may vary due to age or medical condition  Airway Management Planned: Nasal Cannula  Additional Equipment:   Intra-op Plan:   Post-operative Plan:   Informed Consent: I have reviewed the patients History and Physical, chart, labs and discussed the procedure including the risks, benefits and alternatives for the proposed anesthesia with the patient or authorized representative who has indicated his/her understanding and acceptance.     Plan Discussed with:   Anesthesia Plan Comments:         Anesthesia Quick Evaluation

## 2018-03-07 NOTE — Anesthesia Postprocedure Evaluation (Signed)
Anesthesia Post Note  Patient: Michelle Bauer  Procedure(s) Performed: COLONOSCOPY WITH PROPOFOL (N/A )  Patient location during evaluation: Endoscopy Anesthesia Type: General Level of consciousness: awake and alert Pain management: pain level controlled Vital Signs Assessment: post-procedure vital signs reviewed and stable Respiratory status: spontaneous breathing and respiratory function stable Cardiovascular status: stable Anesthetic complications: no     Last Vitals:  Vitals:   03/07/18 0727 03/07/18 1209  BP: 131/68 112/62  Pulse: 68 67  Resp:  (!) 22  Temp: 36.6 C 36.9 C  SpO2: 96% 98%    Last Pain:  Vitals:   03/07/18 1209  TempSrc: Tympanic  PainSc: 0-No pain                 KEPHART,WILLIAM K

## 2018-03-10 ENCOUNTER — Encounter: Payer: Self-pay | Admitting: Gastroenterology

## 2018-03-26 ENCOUNTER — Other Ambulatory Visit: Payer: Self-pay

## 2018-03-26 ENCOUNTER — Encounter: Payer: Self-pay | Admitting: Psychiatry

## 2018-03-26 ENCOUNTER — Ambulatory Visit (INDEPENDENT_AMBULATORY_CARE_PROVIDER_SITE_OTHER): Payer: Medicare Other | Admitting: Psychiatry

## 2018-03-26 VITALS — BP 130/72 | HR 71 | Temp 98.2°F | Wt 146.2 lb

## 2018-03-26 DIAGNOSIS — F411 Generalized anxiety disorder: Secondary | ICD-10-CM | POA: Diagnosis not present

## 2018-03-26 MED ORDER — CLONAZEPAM 0.5 MG PO TABS: 0.5000 mg | ORAL_TABLET | Freq: Four times a day (QID) | ORAL | 5 refills | Status: DC

## 2018-03-26 MED ORDER — ESCITALOPRAM OXALATE 5 MG PO TABS: ORAL_TABLET | ORAL | 5 refills | Status: DC

## 2018-03-26 NOTE — Progress Notes (Signed)
Follow-up patient with chronic anxiety.  No new complaints other than a sadness that came when her sister died this summer.  Otherwise though the patient is really functioning very well.  Enjoys spending time with her family.  Not having any panic attacks or emotional breakdowns.  Seems to be a little more prone to forgetfulness than I remember in the past but is still functioning okay living by herself.  Affect euthymic.  Thoughts lucid no loosening of associations.  No delusions no evidence of psychosis or medicine side effects.  Review medication plan.  Refill all of medicines follow-up 6 months.

## 2018-04-12 ENCOUNTER — Other Ambulatory Visit: Payer: Self-pay | Admitting: Psychiatry

## 2018-04-15 ENCOUNTER — Encounter (INDEPENDENT_AMBULATORY_CARE_PROVIDER_SITE_OTHER): Payer: Self-pay

## 2018-04-15 ENCOUNTER — Ambulatory Visit (INDEPENDENT_AMBULATORY_CARE_PROVIDER_SITE_OTHER): Payer: Medicare Other | Admitting: Gastroenterology

## 2018-04-15 ENCOUNTER — Encounter: Payer: Self-pay | Admitting: Gastroenterology

## 2018-04-15 VITALS — BP 125/65 | HR 76 | Ht 62.0 in | Wt 141.5 lb

## 2018-04-15 DIAGNOSIS — K921 Melena: Secondary | ICD-10-CM

## 2018-04-15 NOTE — Progress Notes (Signed)
Primary Care Physician: [name], MD  Primary Gastroenterologist:  Dr. Midge Minium  Chief Complaint  Patient presents with  . Follow ER    HPI: Michelle Bauer is a 77 y.o. female here for follow-up after being in the hospital for rectal bleeding.  The patient was found to have diverticulosis and internal hemorrhoids.  The patient reports that she has had no further GI bleeding and states that she has been feeling well since being discharged.  The patient does get diarrhea before doctor's appointments because of what she reports to be nerves.  Current Outpatient Medications  Medication Sig Dispense Refill  . aspirin 325 MG tablet Take 325 mg by mouth daily.    Marland Kitchen atorvastatin (LIPITOR) 40 MG tablet Take 40 mg by mouth daily with supper.     . clonazePAM (KLONOPIN) 0.5 MG tablet Take 1 tablet (0.5 mg total) by mouth 4 (four) times daily. 120 tablet 5  . escitalopram (LEXAPRO) 5 MG tablet TAKE 1 TABLET(5 MG) BY MOUTH DAILY 90 tablet 5  . hydrochlorothiazide (HYDRODIURIL) 25 MG tablet Take 25 mg by mouth daily.     . metoprolol succinate (TOPROL-XL) 100 MG 24 hr tablet Take 50-100 mg by mouth See admin instructions. 100 mg every morning and 50 mg at bedtime    . Multiple Vitamins-Minerals (ONE-A-DAY WOMENS 50 PLUS) TABS Take 1 tablet by mouth daily.    Marland Kitchen omega-3 acid ethyl esters (LOVAZA) 1 g capsule Take 1 g by mouth daily with lunch.    . Potassium Gluconate 550 MG TABS Take 1,100 mg by mouth daily.    . traMADol (ULTRAM) 50 MG tablet Take 100 mg by mouth 2 (two) times daily.      No current facility-administered medications for this visit.     Allergies as of 04/15/2018 - Review Complete 04/15/2018  Allergen Reaction Noted  . Other Other (See Comments) 11/08/2015  . Penicillins Other (See Comments) 11/08/2015  . Sulfa antibiotics Other (See Comments) 03/01/2014    ROS:  General: Negative for anorexia, weight loss, fever, chills, fatigue, weakness. ENT: Negative  for hoarseness, difficulty swallowing , nasal congestion. CV: Negative for chest pain, angina, palpitations, dyspnea on exertion, peripheral edema.  Respiratory: Negative for dyspnea at rest, dyspnea on exertion, cough, sputum, wheezing.  GI: See history of present illness. GU:  Negative for dysuria, hematuria, urinary incontinence, urinary frequency, nocturnal urination.  Endo: Negative for unusual weight change.    Physical Examination:   BP 125/65   Pulse 76   Ht 5\' 2"  (1.575 m)   Wt 141 lb 8 oz (64.2 kg)   BMI 25.88 kg/m   General: Well-nourished, well-developed in no acute distress.  Eyes: No icterus. Conjunctivae pink. Mouth: Oropharyngeal mucosa moist and pink , no lesions erythema or exudate. Lungs: Clear to auscultation bilaterally. Non-labored. Heart: Regular rate and rhythm, no murmurs rubs or gallops.  Abdomen: Bowel sounds are normal, nontender, nondistended, no hepatosplenomegaly or masses, no abdominal bruits or hernia , no rebound or guarding.   Extremities: No lower extremity edema. No clubbing or deformities. Neuro: Alert and oriented x 3.  Grossly intact. Skin: Warm and dry, no jaundice.   Psych: Alert and cooperative, normal mood and affect.  Labs:    Imaging Studies: No results found.  Assessment and Plan:   Michelle Bauer is a 77 y.o. y/o female who comes in for follow-up after being discharged from the hospital.  The patient was in the hospital for hematochezia.  The patient has had no further bleeding.  She underwent a colonoscopy that showed her to have diverticulosis and some internal hemorrhoids.  The patient has been told to avoid NSAIDs and to consider going from her 325 mg of aspirin to 81 mg of aspirin unless she has been told differently by either her primary care provider or cardiologist.  The patient will contact me if she has any further lower GI bleeding or if she has a lot of lower GI bleed she will go to the ER.  The patient has been explained  the plan and agrees with it.    Midge Minium, MD. Clementeen Graham   Note: This dictation was prepared with Dragon dictation along with smaller phrase technology. Any transcriptional errors that result from this process are unintentional.

## 2018-09-24 ENCOUNTER — Ambulatory Visit: Payer: Medicare Other | Admitting: Psychiatry

## 2018-12-05 ENCOUNTER — Other Ambulatory Visit: Payer: Self-pay

## 2018-12-05 ENCOUNTER — Ambulatory Visit (INDEPENDENT_AMBULATORY_CARE_PROVIDER_SITE_OTHER): Payer: Medicare Other | Admitting: Psychiatry

## 2018-12-05 ENCOUNTER — Encounter: Payer: Self-pay | Admitting: Psychiatry

## 2018-12-05 ENCOUNTER — Other Ambulatory Visit: Payer: Self-pay | Admitting: Psychiatry

## 2018-12-05 DIAGNOSIS — F411 Generalized anxiety disorder: Secondary | ICD-10-CM | POA: Diagnosis not present

## 2018-12-05 MED ORDER — CLONAZEPAM 0.5 MG PO TABS: 0.5000 mg | ORAL_TABLET | Freq: Four times a day (QID) | ORAL | 5 refills | Status: DC

## 2018-12-05 MED ORDER — ESCITALOPRAM OXALATE 5 MG PO TABS: ORAL_TABLET | ORAL | 5 refills | Status: DC

## 2018-12-05 NOTE — Progress Notes (Signed)
Follow-up for this patient with chronic anxiety.  Patient as it turns out had started tapering herself off of her medicine.  She did this without telling her daughters and as has happened in the past became sort of deliriously anxious.  Daughters became concerned and got the patient to contact me.  Patient admits now that she had no real reason for doing this and that she felt so nervous afterwards that she was frightened to call me.  Patient sounds still very jittery and nervous on the phone.  Does not however appear delirious.  Denies hallucinations.  No evidence of dangerousness.  Talk with patient supportively about this.  She has had this behavior in the past and it has always worked out badly.  Reminded her that she has not had any side effects or problems with her clonazepam.  Refill the usual dosage of 4 a day.  Follow-up in 6 months or as needed.

## 2019-06-19 ENCOUNTER — Telehealth: Payer: Self-pay

## 2019-06-19 ENCOUNTER — Ambulatory Visit: Payer: Medicare Other | Admitting: Psychiatry

## 2019-06-19 DIAGNOSIS — F411 Generalized anxiety disorder: Secondary | ICD-10-CM

## 2019-06-19 MED ORDER — CLONAZEPAM 0.5 MG PO TABS: 0.5000 mg | ORAL_TABLET | Freq: Four times a day (QID) | ORAL | 0 refills | Status: DC

## 2019-06-19 NOTE — Telephone Encounter (Signed)
this is a patient of dr. Weber Cooks. dr. Algie Coffer was going to see pt today but his wife is in the hospital. can you refill pt medications for her since her appt had to be canceled

## 2019-06-19 NOTE — Telephone Encounter (Signed)
completed

## 2019-06-19 NOTE — Telephone Encounter (Signed)
Returned call to daughter-Shannon-and per daughter patient needs Lipitor as well as clonazepam refill.  Discussed that Lipitor will be something that her primary care doctor should take care of. Discussed that Dr. Weber Cooks had provided 5 refills and only 4 refills have been filled till now for the [place].  Discussed she may have 1 refill available.  However per daughter she called the pharmacy and they said she does not have any.  Writer called [facility] spoke to the pharmacist.  Per pharmacist her refills have expired.  We will send Klonopin 0.5 mg 4 times daily-14-day supply.  Patient has upcoming appointment with Dr. Weber Cooks on 07/01/2019.  Writer called daughter again-left voicemail that medication has been sent.

## 2019-07-01 ENCOUNTER — Encounter: Payer: Self-pay | Admitting: Psychiatry

## 2019-07-01 ENCOUNTER — Ambulatory Visit (INDEPENDENT_AMBULATORY_CARE_PROVIDER_SITE_OTHER): Payer: Medicare Other | Admitting: Psychiatry

## 2019-07-01 ENCOUNTER — Other Ambulatory Visit: Payer: Self-pay

## 2019-07-01 DIAGNOSIS — F411 Generalized anxiety disorder: Secondary | ICD-10-CM

## 2019-07-01 MED ORDER — CLONAZEPAM 0.5 MG PO TABS: 0.5000 mg | ORAL_TABLET | Freq: Four times a day (QID) | ORAL | 5 refills | Status: DC

## 2019-07-01 NOTE — Progress Notes (Signed)
Follow-up note for an appointment with this 79 year old woman with a history of chronic anxiety.  Patient was easily reached on the telephone.  She was alert and oriented and aware of the appointment.  Patient had no complaints stating that she continues to take her clonazepam at the regular dose of 0.5 mg 4 times a day and has had no side effects or problems with excessive sleepiness.  She does not feel aware of any negative effect on her memory.  Patient's mood has been good.  She is very well supported by her children and extended family who have been taking care of her during the pandemic.  Affect sounded appropriate and reactive.  Thoughts lucid without any sign of loosening of associations or delusions.  No suicidal thoughts expressed.  Appeared to understand her medication and situation and be able to make appropriate decisions.  No new physical complaints.  Continue longstanding prescription for clonazepam as well as the low-dose Lexapro for which she has a standing prescriptions.  Supportive counseling.  Follow-up 6 months.

## 2019-07-02 ENCOUNTER — Other Ambulatory Visit: Payer: Self-pay | Admitting: Psychiatry

## 2019-07-02 DIAGNOSIS — F411 Generalized anxiety disorder: Secondary | ICD-10-CM

## 2019-07-10 ENCOUNTER — Telehealth: Payer: Self-pay

## 2019-07-16 NOTE — Telephone Encounter (Signed)
error 

## 2019-12-29 ENCOUNTER — Encounter: Payer: Self-pay | Admitting: Psychiatry

## 2019-12-29 ENCOUNTER — Telehealth (INDEPENDENT_AMBULATORY_CARE_PROVIDER_SITE_OTHER): Payer: Medicare Other | Admitting: Psychiatry

## 2019-12-29 ENCOUNTER — Other Ambulatory Visit: Payer: Self-pay

## 2019-12-29 DIAGNOSIS — F332 Major depressive disorder, recurrent severe without psychotic features: Secondary | ICD-10-CM

## 2019-12-29 DIAGNOSIS — F411 Generalized anxiety disorder: Secondary | ICD-10-CM

## 2019-12-29 MED ORDER — CLONAZEPAM 0.5 MG PO TABS: ORAL_TABLET | ORAL | 5 refills | Status: DC

## 2019-12-29 MED ORDER — ESCITALOPRAM OXALATE 5 MG PO TABS: ORAL_TABLET | ORAL | 5 refills | Status: DC

## 2019-12-29 MED ORDER — TRAMADOL HCL 50 MG PO TABS: 100.0000 mg | ORAL_TABLET | Freq: Three times a day (TID) | ORAL | 3 refills | Status: DC

## 2019-12-29 NOTE — Progress Notes (Signed)
This is a follow-up note for this 79 year old woman with a history of chronic anxiety and depression.  Patient was reached by telephone.  Identified myself and identified her.  I also and with the patient's agreement spoke with her daughter Carollee Herter separately after our visit.  Patient is well-known to me from years of treatment.  Patient reports "I am just not happy".  She sounds very down and depressed.  Thought blocking.  Minimal speech.  Flat affect.  Talks about wanting to be alone and have no one around her.  Tired most of the day.  Says that she is enjoying nothing about her life.  Denies hallucinations no evidence of frank psychosis.  Patient says that she takes her medicines only "sporadically".  She cannot be any more detail than that.  Based on previous behavior it would not surprise me if she is not taking it at all.  Patient denies having any thoughts of killing herself.  She did not make any statements that were frankly delusional.  Spoke with patient's daughter who confirms that the patient has been "spiraling down" for months.  Eats very little and less daughters come by and supervise it.  Barely gets out of bed many days.  Talks negatively all the time.  Daughters have been trying their best to keep up with her health and be encouraging but the patient is making it difficult for them.  Patient with chronic depression and anxiety.  She has a well-known pattern I have seen many times of rapidly decompensating when off of her medicine but also rapidly recovering when she is taking her medicine.  Under her current circumstances it does not sound like she meets commitment criteria.  I advised her daughter however that if the patient appears to be starving, if she appears to be getting dehydrated to the point that she is unsteady or seems unhealthy, or if she ever has any comments that sounds suicidal those would definitely be reasons to force her to come to the emergency room.  In the meantime I would  like to do what I can to get her to restart her medicine and also schedule an appointment to speak to her again in a month.  Patient has, for years, routinely taken 0.5 mg of Klonopin 4 times a day and for many years was taking tramadol 100 mg 4 times a day.  In more recent years her primary care doctor who was prescribing the tramadol tried to gently cut this down and the most recent prescription I can see was for 50 mg 4 times a day but I know the patient had always complained that anything less than the 100 4 times a day she felt worse.  Patient is aware daughters are aware and I am aware that these kind of medications are controlled substances potentially habit-forming and also pose risks for the elderly.  In this particular patient I think that we have clear reason to believe that she has tolerated these medicines and done much better on them in the past.  Therefore I think the risk is worth the possible benefit that would come from restarting her medicines.  For that reason I am recent prescribing the Klonopin 0.5 mg 4 times a day and will also take it on myself to prescribe the tramadol at 100 mg 3 times a day for now.  Patient tells me she has no primary care doctor to do this.  I recognize that pain medication of this type is typically outside  of the scope of what I do but this is an exceptional circumstance in which I know this patient very well and know the effect that this medicine has had on her mood.  Patient agrees to speak to me again in another month and daughter is aware of the plan and in agreement.

## 2020-01-26 ENCOUNTER — Other Ambulatory Visit: Payer: Self-pay

## 2020-01-26 ENCOUNTER — Encounter: Payer: Self-pay | Admitting: Psychiatry

## 2020-01-26 ENCOUNTER — Telehealth (INDEPENDENT_AMBULATORY_CARE_PROVIDER_SITE_OTHER): Payer: Medicare Other | Admitting: Psychiatry

## 2020-01-26 DIAGNOSIS — F411 Generalized anxiety disorder: Secondary | ICD-10-CM | POA: Diagnosis not present

## 2020-01-26 DIAGNOSIS — F332 Major depressive disorder, recurrent severe without psychotic features: Secondary | ICD-10-CM | POA: Diagnosis not present

## 2020-01-26 MED ORDER — ESCITALOPRAM OXALATE 10 MG PO TABS: 10.0000 mg | ORAL_TABLET | Freq: Every day | ORAL | 2 refills | Status: DC

## 2020-01-26 NOTE — Progress Notes (Signed)
Follow-up for this patient with depression and anxiety.  Reach patient by telephone.  Identified myself and her.  Spent 20 minutes on the telephone.  Patient reports she is feeling a little better.  She is more lucid today and more appropriate.  Still says she has minimal interest in things but is working with her daughters on trying to eat better and take her medicine regularly.  No complaints of any side effects.  Not oversedated.  Affect sounds more appropriate today although still dysphoric and anxious.  Thoughts generally lucid although no hallucinations.  Denies suicidal ideation.  Reviewed medication with patient.  I would like her to increase the Lexapro to 10 mg.  I talked with her daughter about this as well.  New prescription added for 10 mg Lexapro.  Continue other medicine.  Follow-up on the phone in another month.

## 2020-03-09 ENCOUNTER — Inpatient Hospital Stay: Payer: Medicare Other

## 2020-03-09 ENCOUNTER — Inpatient Hospital Stay
Admission: EM | Admit: 2020-03-09 | Discharge: 2020-03-17 | DRG: 480 | Disposition: A | Discharge status: Skilled Nursing Facility | Payer: Medicare Other | Attending: Internal Medicine | Admitting: Internal Medicine

## 2020-03-09 ENCOUNTER — Other Ambulatory Visit: Payer: Self-pay

## 2020-03-09 ENCOUNTER — Encounter: Payer: Self-pay | Admitting: Emergency Medicine

## 2020-03-09 ENCOUNTER — Emergency Department: Payer: Medicare Other

## 2020-03-09 DIAGNOSIS — E8809 Other disorders of plasma-protein metabolism, not elsewhere classified: Secondary | ICD-10-CM | POA: Diagnosis not present

## 2020-03-09 DIAGNOSIS — R410 Disorientation, unspecified: Secondary | ICD-10-CM | POA: Diagnosis not present

## 2020-03-09 DIAGNOSIS — J189 Pneumonia, unspecified organism: Secondary | ICD-10-CM

## 2020-03-09 DIAGNOSIS — I248 Other forms of acute ischemic heart disease: Secondary | ICD-10-CM | POA: Diagnosis not present

## 2020-03-09 DIAGNOSIS — T502X5A Adverse effect of carbonic-anhydrase inhibitors, benzothiadiazides and other diuretics, initial encounter: Secondary | ICD-10-CM | POA: Diagnosis not present

## 2020-03-09 DIAGNOSIS — D62 Acute posthemorrhagic anemia: Secondary | ICD-10-CM | POA: Diagnosis present

## 2020-03-09 DIAGNOSIS — I110 Hypertensive heart disease with heart failure: Secondary | ICD-10-CM | POA: Diagnosis present

## 2020-03-09 DIAGNOSIS — F411 Generalized anxiety disorder: Secondary | ICD-10-CM | POA: Diagnosis present

## 2020-03-09 DIAGNOSIS — I4891 Unspecified atrial fibrillation: Secondary | ICD-10-CM | POA: Diagnosis not present

## 2020-03-09 DIAGNOSIS — Z79899 Other long term (current) drug therapy: Secondary | ICD-10-CM

## 2020-03-09 DIAGNOSIS — M419 Scoliosis, unspecified: Secondary | ICD-10-CM | POA: Diagnosis present

## 2020-03-09 DIAGNOSIS — Z833 Family history of diabetes mellitus: Secondary | ICD-10-CM

## 2020-03-09 DIAGNOSIS — I10 Essential (primary) hypertension: Secondary | ICD-10-CM | POA: Diagnosis not present

## 2020-03-09 DIAGNOSIS — I480 Paroxysmal atrial fibrillation: Secondary | ICD-10-CM | POA: Diagnosis not present

## 2020-03-09 DIAGNOSIS — E876 Hypokalemia: Secondary | ICD-10-CM

## 2020-03-09 DIAGNOSIS — D649 Anemia, unspecified: Secondary | ICD-10-CM | POA: Diagnosis not present

## 2020-03-09 DIAGNOSIS — Z882 Allergy status to sulfonamides status: Secondary | ICD-10-CM

## 2020-03-09 DIAGNOSIS — Z781 Physical restraint status: Secondary | ICD-10-CM | POA: Diagnosis not present

## 2020-03-09 DIAGNOSIS — Y92010 Kitchen of single-family (private) house as the place of occurrence of the external cause: Secondary | ICD-10-CM

## 2020-03-09 DIAGNOSIS — Z20822 Contact with and (suspected) exposure to covid-19: Secondary | ICD-10-CM | POA: Diagnosis present

## 2020-03-09 DIAGNOSIS — S72001A Fracture of unspecified part of neck of right femur, initial encounter for closed fracture: Secondary | ICD-10-CM | POA: Diagnosis not present

## 2020-03-09 DIAGNOSIS — I959 Hypotension, unspecified: Secondary | ICD-10-CM | POA: Diagnosis not present

## 2020-03-09 DIAGNOSIS — Z800 Family history of malignant neoplasm of digestive organs: Secondary | ICD-10-CM

## 2020-03-09 DIAGNOSIS — Z823 Family history of stroke: Secondary | ICD-10-CM

## 2020-03-09 DIAGNOSIS — I361 Nonrheumatic tricuspid (valve) insufficiency: Secondary | ICD-10-CM | POA: Diagnosis not present

## 2020-03-09 DIAGNOSIS — F05 Delirium due to known physiological condition: Secondary | ICD-10-CM | POA: Diagnosis not present

## 2020-03-09 DIAGNOSIS — Z880 Allergy status to penicillin: Secondary | ICD-10-CM

## 2020-03-09 DIAGNOSIS — I340 Nonrheumatic mitral (valve) insufficiency: Secondary | ICD-10-CM | POA: Diagnosis not present

## 2020-03-09 DIAGNOSIS — D72829 Elevated white blood cell count, unspecified: Secondary | ICD-10-CM | POA: Diagnosis not present

## 2020-03-09 DIAGNOSIS — Z811 Family history of alcohol abuse and dependence: Secondary | ICD-10-CM

## 2020-03-09 DIAGNOSIS — F329 Major depressive disorder, single episode, unspecified: Secondary | ICD-10-CM | POA: Diagnosis not present

## 2020-03-09 DIAGNOSIS — S72141A Displaced intertrochanteric fracture of right femur, initial encounter for closed fracture: Secondary | ICD-10-CM | POA: Diagnosis present

## 2020-03-09 DIAGNOSIS — E785 Hyperlipidemia, unspecified: Secondary | ICD-10-CM | POA: Diagnosis present

## 2020-03-09 DIAGNOSIS — F419 Anxiety disorder, unspecified: Secondary | ICD-10-CM

## 2020-03-09 DIAGNOSIS — E871 Hypo-osmolality and hyponatremia: Secondary | ICD-10-CM | POA: Diagnosis not present

## 2020-03-09 DIAGNOSIS — D6859 Other primary thrombophilia: Secondary | ICD-10-CM | POA: Diagnosis not present

## 2020-03-09 DIAGNOSIS — F0390 Unspecified dementia without behavioral disturbance: Secondary | ICD-10-CM | POA: Diagnosis present

## 2020-03-09 DIAGNOSIS — R778 Other specified abnormalities of plasma proteins: Secondary | ICD-10-CM | POA: Diagnosis not present

## 2020-03-09 DIAGNOSIS — E538 Deficiency of other specified B group vitamins: Secondary | ICD-10-CM | POA: Diagnosis present

## 2020-03-09 DIAGNOSIS — I5033 Acute on chronic diastolic (congestive) heart failure: Secondary | ICD-10-CM | POA: Diagnosis not present

## 2020-03-09 DIAGNOSIS — J810 Acute pulmonary edema: Secondary | ICD-10-CM | POA: Diagnosis not present

## 2020-03-09 DIAGNOSIS — Z01811 Encounter for preprocedural respiratory examination: Secondary | ICD-10-CM

## 2020-03-09 DIAGNOSIS — J9601 Acute respiratory failure with hypoxia: Secondary | ICD-10-CM | POA: Diagnosis not present

## 2020-03-09 DIAGNOSIS — I5031 Acute diastolic (congestive) heart failure: Secondary | ICD-10-CM | POA: Diagnosis not present

## 2020-03-09 DIAGNOSIS — T148XXA Other injury of unspecified body region, initial encounter: Secondary | ICD-10-CM | POA: Diagnosis present

## 2020-03-09 DIAGNOSIS — W01198A Fall on same level from slipping, tripping and stumbling with subsequent striking against other object, initial encounter: Secondary | ICD-10-CM | POA: Diagnosis present

## 2020-03-09 DIAGNOSIS — W19XXXD Unspecified fall, subsequent encounter: Secondary | ICD-10-CM | POA: Diagnosis not present

## 2020-03-09 DIAGNOSIS — R0602 Shortness of breath: Secondary | ICD-10-CM

## 2020-03-09 DIAGNOSIS — S72001D Fracture of unspecified part of neck of right femur, subsequent encounter for closed fracture with routine healing: Secondary | ICD-10-CM | POA: Diagnosis not present

## 2020-03-09 DIAGNOSIS — N179 Acute kidney failure, unspecified: Secondary | ICD-10-CM | POA: Diagnosis not present

## 2020-03-09 DIAGNOSIS — S72009A Fracture of unspecified part of neck of unspecified femur, initial encounter for closed fracture: Secondary | ICD-10-CM

## 2020-03-09 DIAGNOSIS — Z419 Encounter for procedure for purposes other than remedying health state, unspecified: Secondary | ICD-10-CM

## 2020-03-09 DIAGNOSIS — Z8249 Family history of ischemic heart disease and other diseases of the circulatory system: Secondary | ICD-10-CM

## 2020-03-09 DIAGNOSIS — D6869 Other thrombophilia: Secondary | ICD-10-CM | POA: Diagnosis not present

## 2020-03-09 DIAGNOSIS — Z66 Do not resuscitate: Secondary | ICD-10-CM | POA: Diagnosis present

## 2020-03-09 DIAGNOSIS — Z7982 Long term (current) use of aspirin: Secondary | ICD-10-CM

## 2020-03-09 DIAGNOSIS — I2510 Atherosclerotic heart disease of native coronary artery without angina pectoris: Secondary | ICD-10-CM | POA: Diagnosis present

## 2020-03-09 DIAGNOSIS — R739 Hyperglycemia, unspecified: Secondary | ICD-10-CM | POA: Diagnosis present

## 2020-03-09 DIAGNOSIS — W19XXXA Unspecified fall, initial encounter: Secondary | ICD-10-CM

## 2020-03-09 DIAGNOSIS — R0902 Hypoxemia: Secondary | ICD-10-CM

## 2020-03-09 DIAGNOSIS — Z79891 Long term (current) use of opiate analgesic: Secondary | ICD-10-CM

## 2020-03-09 LAB — CBC WITH DIFFERENTIAL/PLATELET
Abs Immature Granulocytes: 0.05 10*3/uL (ref 0.00–0.07)
Basophils Absolute: 0 10*3/uL (ref 0.0–0.1)
Basophils Relative: 0 %
Eosinophils Absolute: 0 10*3/uL (ref 0.0–0.5)
Eosinophils Relative: 0 %
HCT: 29.6 % — ABNORMAL LOW (ref 36.0–46.0)
Hemoglobin: 9.4 g/dL — ABNORMAL LOW (ref 12.0–15.0)
Immature Granulocytes: 1 %
Lymphocytes Relative: 14 %
Lymphs Abs: 1.2 10*3/uL (ref 0.7–4.0)
MCH: 27.8 pg (ref 26.0–34.0)
MCHC: 31.8 g/dL (ref 30.0–36.0)
MCV: 87.6 fL (ref 80.0–100.0)
Monocytes Absolute: 0.6 10*3/uL (ref 0.1–1.0)
Monocytes Relative: 7 %
Neutro Abs: 6.8 10*3/uL (ref 1.7–7.7)
Neutrophils Relative %: 78 %
Platelets: 225 10*3/uL (ref 150–400)
RBC: 3.38 MIL/uL — ABNORMAL LOW (ref 3.87–5.11)
RDW: 13.3 % (ref 11.5–15.5)
WBC: 8.7 10*3/uL (ref 4.0–10.5)
nRBC: 0 % (ref 0.0–0.2)

## 2020-03-09 LAB — COMPREHENSIVE METABOLIC PANEL
ALT: 12 U/L (ref 0–44)
AST: 28 U/L (ref 15–41)
Albumin: 2.9 g/dL — ABNORMAL LOW (ref 3.5–5.0)
Alkaline Phosphatase: 72 U/L (ref 38–126)
Anion gap: 14 (ref 5–15)
BUN: 24 mg/dL — ABNORMAL HIGH (ref 8–23)
CO2: 27 mmol/L (ref 22–32)
Calcium: 8.6 mg/dL — ABNORMAL LOW (ref 8.9–10.3)
Chloride: 96 mmol/L — ABNORMAL LOW (ref 98–111)
Creatinine, Ser: 1.05 mg/dL — ABNORMAL HIGH (ref 0.44–1.00)
GFR calc Af Amer: 59 mL/min — ABNORMAL LOW (ref >60)
GFR calc non Af Amer: 51 mL/min — ABNORMAL LOW (ref >60)
Glucose, Bld: 135 mg/dL — ABNORMAL HIGH (ref 70–99)
Potassium: 3.3 mmol/L — ABNORMAL LOW (ref 3.5–5.1)
Sodium: 137 mmol/L (ref 135–145)
Total Bilirubin: 0.6 mg/dL (ref 0.3–1.2)
Total Protein: 7.2 g/dL (ref 6.5–8.1)

## 2020-03-09 LAB — TYPE AND SCREEN
ABO/RH(D): O POS
Antibody Screen: NEGATIVE

## 2020-03-09 LAB — APTT: aPTT: 32 s (ref 24–36)

## 2020-03-09 LAB — SARS CORONAVIRUS 2 BY RT PCR (HOSPITAL ORDER, PERFORMED IN ~~LOC~~ HOSPITAL LAB): SARS Coronavirus 2: NEGATIVE

## 2020-03-09 LAB — PROTIME-INR
INR: 1.1 (ref 0.8–1.2)
Prothrombin Time: 14.2 seconds (ref 11.4–15.2)

## 2020-03-09 MED ORDER — ESCITALOPRAM OXALATE 10 MG PO TABS: 10.0000 mg | ORAL_TABLET | Freq: Every day | ORAL | Status: DC

## 2020-03-09 MED ORDER — OMEGA-3-ACID ETHYL ESTERS 1 G PO CAPS
1.0000 g | ORAL_CAPSULE | Freq: Every day | ORAL | Status: DC
  Administered 2020-03-12 – 2020-03-17 (×6): 1 g via ORAL
  Filled 2020-03-09 (×6): qty 1

## 2020-03-09 MED ORDER — MORPHINE SULFATE (PF) 2 MG/ML IV SOLN: 0.5000 mg | INTRAVENOUS | Status: DC | PRN

## 2020-03-09 MED ORDER — POTASSIUM CHLORIDE CRYS ER 20 MEQ PO TBCR: 40.0000 meq | EXTENDED_RELEASE_TABLET | Freq: Once | ORAL | Status: DC

## 2020-03-09 MED ORDER — ONDANSETRON HCL 4 MG/2ML IJ SOLN
4.0000 mg | Freq: Once | INTRAMUSCULAR | Status: AC
  Administered 2020-03-09: 4 mg via INTRAVENOUS
  Filled 2020-03-09: qty 2

## 2020-03-09 MED ORDER — PSYLLIUM 95 % PO PACK
1.0000 | PACK | Freq: Every day | ORAL | Status: DC
  Administered 2020-03-12 – 2020-03-17 (×5): 1 via ORAL
  Filled 2020-03-09 (×9): qty 1

## 2020-03-09 MED ORDER — ADULT MULTIVITAMIN W/MINERALS CH
1.0000 | ORAL_TABLET | Freq: Every day | ORAL | Status: DC
  Administered 2020-03-12 – 2020-03-17 (×7): 1 via ORAL
  Filled 2020-03-09 (×9): qty 1

## 2020-03-09 MED ORDER — CLONAZEPAM 0.5 MG PO TABS
0.5000 mg | ORAL_TABLET | Freq: Four times a day (QID) | ORAL | Status: DC
  Administered 2020-03-09 – 2020-03-17 (×26): 0.5 mg via ORAL
  Filled 2020-03-09 (×27): qty 1

## 2020-03-09 MED ORDER — HYDROMORPHONE HCL 1 MG/ML IJ SOLN
0.5000 mg | Freq: Once | INTRAMUSCULAR | Status: AC
  Administered 2020-03-09: 0.5 mg via INTRAVENOUS
  Filled 2020-03-09: qty 1

## 2020-03-09 MED ORDER — ESCITALOPRAM OXALATE 10 MG PO TABS
5.0000 mg | ORAL_TABLET | Freq: Two times a day (BID) | ORAL | Status: DC
  Filled 2020-03-09 (×3): qty 0.5

## 2020-03-09 MED ORDER — HYDROCODONE-ACETAMINOPHEN 5-325 MG PO TABS: 1.0000 | ORAL_TABLET | Freq: Four times a day (QID) | ORAL | Status: DC | PRN

## 2020-03-09 MED ORDER — CLONAZEPAM 0.5 MG PO TABS: 0.5000 mg | ORAL_TABLET | Freq: Two times a day (BID) | ORAL | Status: DC

## 2020-03-09 MED ORDER — BISACODYL 5 MG PO TBEC: 5.0000 mg | DELAYED_RELEASE_TABLET | Freq: Every day | ORAL | Status: DC | PRN

## 2020-03-09 MED ORDER — ATORVASTATIN CALCIUM 20 MG PO TABS
40.0000 mg | ORAL_TABLET | Freq: Every day | ORAL | Status: DC
  Administered 2020-03-11 – 2020-03-16 (×6): 40 mg via ORAL
  Filled 2020-03-09 (×6): qty 2

## 2020-03-09 MED ORDER — CHLORHEXIDINE GLUCONATE CLOTH 2 % EX PADS
6.0000 | MEDICATED_PAD | Freq: Once | CUTANEOUS | Status: AC
  Administered 2020-03-10: 6 via TOPICAL

## 2020-03-09 MED ORDER — ESCITALOPRAM OXALATE 10 MG PO TABS: 5.0000 mg | ORAL_TABLET | Freq: Every day | ORAL | Status: DC

## 2020-03-09 MED ORDER — HYDRALAZINE HCL 20 MG/ML IJ SOLN
10.0000 mg | Freq: Four times a day (QID) | INTRAMUSCULAR | Status: DC | PRN
  Filled 2020-03-09: qty 1

## 2020-03-09 MED ORDER — SENNOSIDES-DOCUSATE SODIUM 8.6-50 MG PO TABS: 1.0000 | ORAL_TABLET | Freq: Every evening | ORAL | Status: DC | PRN

## 2020-03-09 MED ORDER — LACTATED RINGERS IV SOLN: INTRAVENOUS | Status: DC

## 2020-03-09 NOTE — Consult Note (Signed)
ORTHOPAEDIC CONSULTATION  REQUESTING PHYSICIAN: [name], MD  Chief Complaint: Right hip pain status post fall yesterday  HPI: Michelle Bauer is a 79 y.o. female who complains of right hip pain after a fall yesterday at home.  She is seen in the emergency department with her daughter Carollee Herter.  Patient had x-rays upon arrival to the emergency room which showed a displaced right intertrochanteric hip fracture.  Orthopedics is consulted for management of the fracture.  Patient is being admitted to the hospital service for preoperative clearance.  Patient denies other significant injuries.    Past Medical History:  Diagnosis Date  . Anxiety   . Depression   . Essential hypertension   . Hyperlipidemia    Past Surgical History:  Procedure Laterality Date  . ABDOMINAL HYSTERECTOMY    . BYPASS AXILLA/BRACHIAL ARTERY    . CHOLECYSTECTOMY    . COLONOSCOPY WITH PROPOFOL N/A 03/07/2018   Procedure: COLONOSCOPY WITH PROPOFOL;  Surgeon: Midge Minium, MD;  Location: [facility] ENDOSCOPY;  Service: Endoscopy;  Laterality: N/A;   Social History   Socioeconomic History  . Marital status: Married    Spouse name: Not on file  . Number of children: Not on file  . Years of education: Not on file  . Highest education level: Not on file  Occupational History  . Not on file  Tobacco Use  . Smoking status: Never Smoker  . Smokeless tobacco: Never Used  Substance and Sexual Activity  . Alcohol use: No    Alcohol/week: 0.0 standard drinks  . Drug use: No  . Sexual activity: Not Currently  Other Topics Concern  . Not on file  Social History Narrative  . Not on file   Social Determinants of Health   Financial Resource Strain:   . Difficulty of Paying Living Expenses: Not on file  Food Insecurity:   . Worried About Programme researcher, broadcasting/film/video in the Last Year: Not on file  . Ran Out of Food in the Last Year: Not on file  Transportation Needs:   . Lack of Transportation (Medical): Not on file  . Lack  of Transportation (Non-Medical): Not on file  Physical Activity:   . Days of Exercise per Week: Not on file  . Minutes of Exercise per Session: Not on file  Stress:   . Feeling of Stress : Not on file  Social Connections:   . Frequency of Communication with Friends and Family: Not on file  . Frequency of Social Gatherings with Friends and Family: Not on file  . Attends Religious Services: Not on file  . Active Member of Clubs or Organizations: Not on file  . Attends Banker Meetings: Not on file  . Marital Status: Not on file   Family History  Problem Relation Age of Onset  . Stroke Mother   . Heart attack Father   . Alcohol abuse Father   . Stroke Sister   . Diabetes Brother   . Colon cancer Sister    Allergies  Allergen Reactions  . Other Other (See Comments)  . Penicillins Other (See Comments)    Has patient had a PCN reaction causing immediate rash, facial/tongue/throat swelling, SOB or lightheadedness with hypotension: Unknown Has patient had a PCN reaction causing severe rash involving mucus membranes or skin necrosis: Unknown Has patient had a PCN reaction that required hospitalization: Unknown Has patient had a PCN reaction occurring within the last 10 years: No If all of the above answers are "NO", then may  proceed with Cephalosporin use.  . Sulfa Antibiotics Other (See Comments)   Prior to Admission medications   Medication Sig Start Date End Date Taking? Authorizing Provider  clonazePAM (KLONOPIN) 0.5 MG tablet TAKE 1 TABLET(0.5 MG) BY MOUTH FOUR TIMES DAILY 12/29/19  Yes Clapacs, Jackquline Denmark, MD  aspirin 325 MG tablet Take 325 mg by mouth daily.    [provider]  atorvastatin (LIPITOR) 40 MG tablet Take 40 mg by mouth daily with supper.     [provider]  escitalopram (LEXAPRO) 10 MG tablet Take 1 tablet (10 mg total) by mouth daily. 01/26/20 04/25/20  Clapacs, Jackquline Denmark, MD  escitalopram (LEXAPRO) 5 MG tablet TAKE 1 TABLET(5 MG) BY MOUTH  DAILY 12/05/18   Clapacs, Jackquline Denmark, MD  escitalopram (LEXAPRO) 5 MG tablet TAKE 1 TABLET(5 MG) BY MOUTH DAILY 12/29/19   Clapacs, Jackquline Denmark, MD  hydrochlorothiazide (HYDRODIURIL) 25 MG tablet Take 25 mg by mouth daily.     [provider]  metoprolol succinate (TOPROL-XL) 100 MG 24 hr tablet Take 50-100 mg by mouth See admin instructions. 100 mg every morning and 50 mg at bedtime    [provider]  Multiple Vitamins-Minerals (ONE-A-DAY WOMENS 50 PLUS) TABS Take 1 tablet by mouth daily.    [provider]  omega-3 acid ethyl esters (LOVAZA) 1 g capsule Take 1 g by mouth daily with lunch.    [provider]  Potassium Gluconate 550 MG TABS Take 1,100 mg by mouth daily.    [provider]  traMADol (ULTRAM) 50 MG tablet Take 2 tablets (100 mg total) by mouth in the morning, at noon, and at bedtime. 12/29/19   Clapacs, Jackquline Denmark, MD   DG Chest 1 View  Result Date: 03/09/2020 CLINICAL DATA:  Pain EXAM: CHEST  1 VIEW COMPARISON:  None. FINDINGS: There is a significant S shaped curvature of the thoracolumbar spine. The heart size appears mildly enlarged. The patient is status post prior median sternotomy. There is no pneumothorax or large pleural effusion. There is no acute osseous abnormality. IMPRESSION: No active disease. Electronically Signed   By: Katherine Mantle M.D.   On: 03/09/2020 15:15   DG Hip Unilat W or Wo Pelvis 2-3 Views Right  Result Date: 03/09/2020 CLINICAL DATA:  Pain EXAM: DG HIP (WITH OR WITHOUT PELVIS) 2-3V RIGHT COMPARISON:  None. FINDINGS: There is an acute displaced intratrochanteric fracture of the proximal right femur. There is no dislocation. There is osteopenia. 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 Vascular calcifications are noted. IMPRESSION: Acute displaced intratrochanteric fracture of the proximal right femur. Electronically Signed   By: Katherine Mantle M.D.   On: 03/09/2020 15:14    Positive ROS: All other systems have been  reviewed and were otherwise negative with the exception of those mentioned in the HPI and as above.  Physical Exam: General: Alert, no acute distress  MUSCULOSKELETAL: Right lower extremity: Patient skin is intact.  There is no erythema ecchymosis swelling.  Compartments of the lower extremity are soft and compressible.  Patient has shortening and external rotation of the right lower extremity.  She has palpable pedal pulses, intact sensation light touch and can flex extend her toes and dorsiflex and plantarflex her ankle.  Assessment: Displaced right intertrochanteric hip fracture  Plan: I reviewed the details of the operation as well as the postoperative course with the patient's daughter.  I have recommended intramedullary fixation for the patient's hip fracture.  I also discussed the risks and benefits of  surgery with the patient's daughter.  She understands the risks include but are not limited to infection, bleeding requiring blood transfusion, nerve or blood vessel injury, joint stiffness or loss of motion, persistent pain, weakness or instability, malunion, nonunion and hardware failure and the need for further surgery. Medical risks include but are not limited to DVT and pulmonary embolism, myocardial infarction, stroke, pneumonia, respiratory failure and death. Patient's daughter understood these risks and wished to proceed.       Juanell Fairly, MD    03/09/2020 5:43 PM

## 2020-03-09 NOTE — H&P (Addendum)
History and Physical    Michelle Bauer RUE:[id number] DOB: 08/26/1940 DOA: 03/09/2020  PCP: Lauro RegulusAnderson, Marshall W, MD  Patient coming from: Home   Chief Complaint: s/p fall , inablility to walk  HPI: Michelle Bauer is a 79 y.o. female with medical history significant of 79 year old female with past medical history of hypertension, hyperlipidemia, anxiety/depression, severe scoliosis, diverticular bleed presented status post mechanical fall which occurred yesterday.  Patient was on her usual health and her daughter was coming to visit her.  She went to unlock to double lock since her daughter did not have the key to the door and she reports a chair was pulled out in the kitchen and somehow she stumbled over it and fell.  She did hit her head against a wall.  Daughter arrived 30 minutes later meanwhile patient was on the floor.  This occurred yesterday and patient refused to come to the ER.  Daughter realized patient is unable to walk with a call patient's PCP and PCP recommended them to call EMS. She reports pain of rt hip as she points to it. Denies any other pain.  She denies headache, vision change, chest pain or shortness of breath.  Daughter thinks she is starting to have some dementia signs but has not been evaluated.  Patient reports chronic diarrhea however now is more formed and soft with Metamucil as she takes it every day.  Patient is Covid negative.  However she has not had her vaccination yet.  Daughters have been vaccinated.  ED Course: Blood pressure 137/78 afebrile, on room air 97%  Review of Systems: All systems reviewed and otherwise negative.  Potassium 3.3, creatinine 1.05, glucose 135 BUN 24 WBC 8.7 hemoglobin 9.4 hematocrit 29.6 MCV 87.6  Hip x-ray revealed acute displaced intertrochanteric fracture of the proximal right femur  Chest x-ray personally reviewed no acute cardiopulmonary disease EKG personally reviewed sinus rhythm no ischemic ST changes  Past Medical  History:  Diagnosis Date  . Anxiety   . Depression   . Essential hypertension   . Hyperlipidemia     Past Surgical History:  Procedure Laterality Date  . ABDOMINAL HYSTERECTOMY    . BYPASS AXILLA/BRACHIAL ARTERY    . CHOLECYSTECTOMY    . COLONOSCOPY WITH PROPOFOL N/A 03/07/2018   Procedure: COLONOSCOPY WITH PROPOFOL;  Surgeon: Midge MiniumWohl, Darren, MD;  [facility] END[facility]COPY;  Service: Endoscopy;  Laterality: N/A;     reports that she has never smoked. She has never used smokeless tobacco. She reports that she does not drink alcohol and does not use drugs.  Allergies  Allergen Reactions  . Other Other (See Comments)  . Penicillins Other (See Comments)    Has patient had a PCN reaction causing immediate rash, facial/tongue/throat swelling, SOB or lightheadedness with hypotension: Unknown Has patient had a PCN reaction causing severe rash involving mucus membranes or skin necrosis: Unknown Has patient had a PCN reaction that required hospitalization: Unknown Has patient had a PCN reaction occurring within the last 10 years: No If all of the above answers are "NO", then may proceed with Cephalosporin use.  . Sulfa Antibiotics Other (See Comments)    Family History  Problem Relation Age of Onset  . Stroke Mother   . Heart attack Father   . Alcohol abuse Father   . Stroke Sister   . Diabetes Brother   . Colon cancer Sister      Prior to Admission medications   Medication Sig Start Date End Date Taking? Authorizing Provider  aspirin 325 MG tablet Take 325 mg by mouth daily.    [provider]  atorvastatin (LIPITOR) 40 MG tablet Take 40 mg by mouth daily with supper.     [provider]  clonazePAM (KLONOPIN) 0.5 MG tablet TAKE 1 TABLET(0.5 MG) BY MOUTH FOUR TIMES DAILY 12/29/19   Clapacs, Jackquline Denmark, MD  escitalopram (LEXAPRO) 10 MG tablet Take 1 tablet (10 mg total) by mouth daily. 01/26/20 04/25/20  Clapacs, Jackquline Denmark, MD  escitalopram (LEXAPRO) 5 MG tablet TAKE 1  TABLET(5 MG) BY MOUTH DAILY 12/05/18   Clapacs, Jackquline Denmark, MD  escitalopram (LEXAPRO) 5 MG tablet TAKE 1 TABLET(5 MG) BY MOUTH DAILY 12/29/19   Clapacs, Jackquline Denmark, MD  hydrochlorothiazide (HYDRODIURIL) 25 MG tablet Take 25 mg by mouth daily.     [provider]  metoprolol succinate (TOPROL-XL) 100 MG 24 hr tablet Take 50-100 mg by mouth See admin instructions. 100 mg every morning and 50 mg at bedtime    [provider]  Multiple Vitamins-Minerals (ONE-A-DAY WOMENS 50 PLUS) TABS Take 1 tablet by mouth daily.    [provider]  omega-3 acid ethyl esters (LOVAZA) 1 g capsule Take 1 g by mouth daily with lunch.    [provider]  Potassium Gluconate 550 MG TABS Take 1,100 mg by mouth daily.    [provider]  traMADol (ULTRAM) 50 MG tablet Take 2 tablets (100 mg total) by mouth in the morning, at noon, and at bedtime. 12/29/19   Clapacs, Jackquline Denmark, MD    Physical Exam: Vitals:   03/09/20 1427 03/09/20 1428 03/09/20 1658  BP: 137/78  130/70  Pulse: 78  70  Resp: 18  16  Temp: 98.3 F (36.8 C)    TempSrc: Oral    SpO2: 97%  97%  Weight:  56 kg   Height:  5\' 5"  (1.651 m)     Constitutional: NAD, calm, comfortable Vitals:   03/09/20 1427 03/09/20 1428 03/09/20 1658  BP: 137/78  130/70  Pulse: 78  70  Resp: 18  16  Temp: 98.3 F (36.8 C)    TempSrc: Oral    SpO2: 97%  97%  Weight:  56 kg   Height:  5\' 5"  (1.651 m)    Eyes: PERRL, lids and conjunctivae normal ENMT: mask on  Neck: normal, supple, no masses, no carotid bruit Respiratory: clear to auscultation bilaterally, no wheezing, no crackles. Normal respiratory effort. No accessory muscle use.  Cardiovascular: Regular rate and rhythm, no murmurs / rubs / gallops.  No extremity edema. 2+ pedal pulses.  Abdomen: no tenderness, no masses palpated. No hepatosplenomegaly. Bowel sounds positive. Hernia above the umbilicus Musculoskeletal: no clubbing / cyanosis. No joint deformity upper and lower  extremities. Good ROM, no contractures. Normal muscle tone.  Skin: Excoriation around the upper chest and skin of face is dry more of the red tone (per daughter pt picks on her skin due to dryness Neurologic: CN 2-12 grossly intact. Sensation intact,Strength 5/5 in all 4.  Psychiatric: Normal judgment and insight. Alert and oriented x 3. Normal mood.    Labs on Admission: I have personally reviewed following labs and imaging studies  CBC: Recent Labs  Lab 03/09/20 1440  WBC 8.7  NEUTROABS 6.8  HGB 9.4*  HCT 29.6*  MCV 87.6  PLT 225   Basic Metabolic Panel: Recent Labs  Lab 03/09/20 1440  NA 137  K 3.3*  CL 96*  CO2 27  GLUCOSE 135*  BUN 24*  CREATININE 1.05*  CALCIUM 8.6*   GFR: Estimated Creatinine Clearance: 39.7 mL/min (A) (by C-G formula based on SCr of 1.05 mg/dL (H)). Liver Function Tests: Recent Labs  Lab 03/09/20 1440  AST 28  ALT 12  ALKPHOS 72  BILITOT 0.6  PROT 7.2  ALBUMIN 2.9*   No results for input(s): LIPASE, AMYLASE in the last 168 hours. No results for input(s): AMMONIA in the last 168 hours. Coagulation Profile: No results for input(s): INR, PROTIME in the last 168 hours. Cardiac Enzymes: No results for input(s): CKTOTAL, CKMB, CKMBINDEX, TROPONINI in the last 168 hours. BNP (last 3 results) No results for input(s): PROBNP in the last 8760 hours. HbA1C: No results for input(s): HGBA1C in the last 72 hours. CBG: No results for input(s): GLUCAP in the last 168 hours. Lipid Profile: No results for input(s): CHOL, HDL, LDLCALC, TRIG, CHOLHDL, LDLDIRECT in the last 72 hours. Thyroid Function Tests: No results for input(s): TSH, T4TOTAL, FREET4, T3FREE, THYROIDAB in the last 72 hours. Anemia Panel: No results for input(s): VITAMINB12, FOLATE, FERRITIN, TIBC, IRON, RETICCTPCT in the last 72 hours. Urine analysis: No results found for: COLORURINE, APPEARANCEUR, LABSPEC, PHURINE, GLUCOSEU, HGBUR, BILIRUBINUR, KETONESUR, PROTEINUR, UROBILINOGEN,  NITRITE, LEUKOCYTESUR  Radiological Exams on Admission: DG Chest 1 View  Result Date: 03/09/2020 CLINICAL DATA:  Pain EXAM: CHEST  1 VIEW COMPARISON:  None. FINDINGS: There is a significant S shaped curvature of the thoracolumbar spine. The heart size appears mildly enlarged. The patient is status post prior median sternotomy. There is no pneumothorax or large pleural effusion. There is no acute osseous abnormality. IMPRESSION: No active disease. Electronically Signed   By: Katherine Mantle M.D.   On: 03/09/2020 15:15   CT HEAD WO CONTRAST  Result Date: 03/09/2020 CLINICAL DATA:  Larey Seat today, hip fracture EXAM: CT HEAD WITHOUT CONTRAST TECHNIQUE: Contiguous axial images were obtained from the base of the skull through the vertex without intravenous contrast. COMPARISON:  None. FINDINGS: Brain: Scattered hypodensities are seen throughout the periventricular white matter consistent with age-indeterminate small vessel ischemic changes, likely chronic. No other signs of acute infarct or hemorrhage. Lateral ventricles and midline structures are unremarkable. No acute extra-axial fluid collections. No mass effect. Vascular: No hyperdense vessel or unexpected calcification. Skull: Normal. Negative for fracture or focal lesion. Sinuses/Orbits: No acute finding. Other: None. IMPRESSION: 1. Age-indeterminate small-vessel ischemic changes in the white matter, favor chronic. 2. No acute intracranial trauma. Electronically Signed   By: Sharlet Salina M.D.   On: 03/09/2020 17:47   DG Hip Unilat W or Wo Pelvis 2-3 Views Right  Result Date: 03/09/2020 CLINICAL DATA:  Pain EXAM: DG HIP (WITH OR WITHOUT PELVIS) 2-3V RIGHT COMPARISON:  None. FINDINGS: There is an acute displaced intratrochanteric fracture of the proximal right femur. There is no dislocation. There is osteopenia. 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 Vascular calcifications are noted. IMPRESSION: Acute displaced intratrochanteric fracture of the  proximal right femur. Electronically Signed   By: Katherine Mantle M.D.   On: 03/09/2020 15:14      Assessment/Plan Active Problems:   Generalized anxiety disorder   HLD (hyperlipidemia)   Fracture   Essential hypertension   Hypokalemia    1. S/p mechanical fall - sustained Rt proximal femur fracture ER spoke to Dr. Nils Flack orthopedics, plan for OR in a.m. Pain management PT/OT evaluation post surgery when cleared by orthopedic N.p.o. at midnight IV fluids for hydration Will obtain head CT     2.  Essential hypertension-on hydrochlorothiazide only Blood pressure  currently stable and since she is going to surgery will hold off and place her on IV hydralazine as needed for systolic blood pressure more than 160 Hold off on HCTZ hypokalemia  3.  Hyperlipidemia-continue home statin and Lovaza  4.  Hypokalemia will replace with 40 mEq p.o.  5.  Depression/anxiety-I have asked pharmacy to review med dosage and will start when completed. She is on Klonipine QID? For now will start on bid dosing. Lexapro needs to be reviewed by phamacy  DVT prophylaxis: scd  Code Status: DNR verified by pt and daughter was present and in agreement  Family Communication: Daughter at bedside updated all questions and concerns Disposition Plan: SNF vs Home Consults called: Dr. Maudie Flakes orthopedics  Admission status: Inpatient as patient requires more than 2 midnight stays   [name] MD [facility] Pager [phone]   If 7PM-7AM, please contact night-coverage [email or web address] Password [facility]  03/09/2020, 5:56 PM

## 2020-03-09 NOTE — ED Provider Notes (Signed)
[facility] Emergency Department Provider Note   ____________________________________________   First MD Initiated Contact with Patient 03/09/20 1425     (approximate)  I have reviewed the triage vital signs and the nursing notes.   HISTORY  Chief Complaint Fall and Hip Pain    HPI Michelle Bauer is a 79 y.o. female patient arrived via EMS complaining of unable to bear weight on the right lower extremity secondary to a fall.  Patient is a fall occurred last night but she refused to come to the ED.  Patient daughter finally convinced patient to come in after noticing deformity to the hip.         Past Medical History:  Diagnosis Date  . Anxiety   . Depression   . Essential hypertension   . Hyperlipidemia     Patient Active Problem List   Diagnosis Date Noted  . Hematochezia   . Rectal bleeding   . GI bleed 03/05/2018  . Encounter for long-term (current) use of other medications 09/25/2017  . Chronic diarrhea 09/11/2017  . CAD in native artery 11/08/2015  . Other long term (current) drug therapy 11/08/2015  . Elevated fasting blood sugar 11/08/2015  . Generalized anxiety disorder 12/07/2014  . Back ache 02/14/2014  . Scoliosis 05/27/2012  . BP (high blood pressure) 11/20/2011  . HLD (hyperlipidemia) 11/20/2011    Past Surgical History:  Procedure Laterality Date  . ABDOMINAL HYSTERECTOMY    . BYPASS AXILLA/BRACHIAL ARTERY    . CHOLECYSTECTOMY    . COLONOSCOPY WITH PROPOFOL N/A 03/07/2018   Procedure: COLONOSCOPY WITH PROPOFOL;  Surgeon: Midge Minium, MD;  Location: [facility] ENDOSCOPY;  Service: Endoscopy;  Laterality: N/A;    Prior to Admission medications   Medication Sig Start Date End Date Taking? Authorizing Provider  aspirin 325 MG tablet Take 325 mg by mouth daily.    [provider]  atorvastatin (LIPITOR) 40 MG tablet Take 40 mg by mouth daily with supper.     [provider]  clonazePAM (KLONOPIN) 0.5 MG  tablet TAKE 1 TABLET(0.5 MG) BY MOUTH FOUR TIMES DAILY 12/29/19   Clapacs, Jackquline Denmark, MD  escitalopram (LEXAPRO) 10 MG tablet Take 1 tablet (10 mg total) by mouth daily. 01/26/20 04/25/20  Clapacs, Jackquline Denmark, MD  escitalopram (LEXAPRO) 5 MG tablet TAKE 1 TABLET(5 MG) BY MOUTH DAILY 12/05/18   Clapacs, Jackquline Denmark, MD  escitalopram (LEXAPRO) 5 MG tablet TAKE 1 TABLET(5 MG) BY MOUTH DAILY 12/29/19   Clapacs, Jackquline Denmark, MD  hydrochlorothiazide (HYDRODIURIL) 25 MG tablet Take 25 mg by mouth daily.     [provider]  metoprolol succinate (TOPROL-XL) 100 MG 24 hr tablet Take 50-100 mg by mouth See admin instructions. 100 mg every morning and 50 mg at bedtime    [provider]  Multiple Vitamins-Minerals (ONE-A-DAY WOMENS 50 PLUS) TABS Take 1 tablet by mouth daily.    [provider]  omega-3 acid ethyl esters (LOVAZA) 1 g capsule Take 1 g by mouth daily with lunch.    [provider]  Potassium Gluconate 550 MG TABS Take 1,100 mg by mouth daily.    [provider]  traMADol (ULTRAM) 50 MG tablet Take 2 tablets (100 mg total) by mouth in the morning, at noon, and at bedtime. 12/29/19   Clapacs, Jackquline Denmark, MD    Allergies Other, Penicillins, and Sulfa antibiotics  Family History  Problem Relation Age of Onset  . Stroke Mother   . Heart attack Father   .  Alcohol abuse Father   . Stroke Sister   . Diabetes Brother   . Colon cancer Sister     Social History Social History   Tobacco Use  . Smoking status: Never Smoker  . Smokeless tobacco: Never Used  Substance Use Topics  . Alcohol use: No    Alcohol/week: 0.0 standard drinks  . Drug use: No    Review of Systems Constitutional: No fever/chills Eyes: No visual changes. ENT: No sore throat. Cardiovascular: Denies chest pain. Respiratory: Denies shortness of breath. Gastrointestinal: No abdominal pain.  No nausea, no vomiting.  No diarrhea.  No constipation. Genitourinary: Negative for dysuria. Musculoskeletal:  Right hip pain. Skin: Negative for rash. Neurological: Negative for headaches, focal weakness or numbness. Psychiatric:  Anxiety and depression. Endocrine:  Hyperlipidemia and hypertension. Hematological/Lymphatic:  Allergic/Immunilogical: Penicillin and sulfa antibiotics.  ____________________________________________   PHYSICAL EXAM:  VITAL SIGNS: ED Triage Vitals  Enc Vitals Group     BP 03/09/20 1427 137/78     Pulse Rate 03/09/20 1427 78     Resp 03/09/20 1427 18     Temp 03/09/20 1427 98.3 F (36.8 C)     Temp Source 03/09/20 1427 Oral     SpO2 03/09/20 1427 97 %     Weight 03/09/20 1428 123 lb 7.3 oz (56 kg)     Height 03/09/20 1428 5\' 5"  (1.651 m)     Head Circumference --      Peak Flow --      Pain Score 03/09/20 1428 6     Pain Loc --      Pain Edu? --      Excl. in GC? --    Constitutional: Alert and oriented. Well appearing and in no acute distress.  Anxious. Eyes: Conjunctivae are normal. PERRL. EOMI. Head: Atraumatic. Nose: No congestion/rhinnorhea. Mouth/Throat: Mucous membranes are moist.  Oropharynx non-erythematous. Neck:No cervical spine tenderness to palpation. Cardiovascular: Normal rate, regular rhythm. Grossly normal heart sounds.  Good peripheral circulation. Respiratory: Normal respiratory effort.  No retractions. Lungs CTAB. Gastrointestinal: Soft and nontender. No distention. No abdominal bruits. No CVA tenderness. Genitourinary: Deferred Musculoskeletal: No obvious deformity to the right hip.  No leg length discrepancy.  Patient has moderate guarding palpation of the greater trochanter.. Neurologic:  Normal speech and language. No gross focal neurologic deficits are appreciated. No gait instability. Skin:  Skin is warm, dry and intact. No rash noted. Psychiatric: Mood and affect are normal. Speech and behavior are normal.  ____________________________________________   LABS (all labs ordered are listed, but only abnormal results are  displayed)  Labs Reviewed  CBC WITH DIFFERENTIAL/PLATELET - Abnormal; Notable for the following components:      Result Value   RBC 3.38 (*)    Hemoglobin 9.4 (*)    HCT 29.6 (*)    All other components within normal limits  COMPREHENSIVE METABOLIC PANEL - Abnormal; Notable for the following components:   Potassium 3.3 (*)    Chloride 96 (*)    Glucose, Bld 135 (*)    BUN 24 (*)    Creatinine, Ser 1.05 (*)    Calcium 8.6 (*)    Albumin 2.9 (*)    GFR calc non Af Amer 51 (*)    GFR calc Af Amer 59 (*)    All other components within normal limits  SARS CORONAVIRUS 2 BY RT PCR (HOSPITAL ORDER, PERFORMED IN [facility] [facility])  PROTIME-INR  APTT  TYPE AND SCREEN   ____________________________________________  EKG  ____________________________________________  RADIOLOGY  ED MD interpretation:   Patient has a displaced intertrochanteric fracture of the proximal right femur Official radiology report(s): DG Chest 1 View  Result Date: 03/09/2020 CLINICAL DATA:  Pain EXAM: CHEST  1 VIEW COMPARISON:  None. FINDINGS: There is a significant S shaped curvature of the thoracolumbar spine. The heart size appears mildly enlarged. The patient is status post prior median sternotomy. There is no pneumothorax or large pleural effusion. There is no acute osseous abnormality. IMPRESSION: No active disease. Electronically Signed   By: Katherine Mantle M.D.   On: 03/09/2020 15:15   DG Hip Unilat W or Wo Pelvis 2-3 Views Right  Result Date: 03/09/2020 CLINICAL DATA:  Pain EXAM: DG HIP (WITH OR WITHOUT PELVIS) 2-3V RIGHT COMPARISON:  None. FINDINGS: There is an acute displaced intratrochanteric fracture of the proximal right femur. There is no dislocation. There is osteopenia. 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 Vascular calcifications are noted. IMPRESSION: Acute displaced intratrochanteric fracture of the proximal right femur. Electronically Signed   By: Katherine Mantle M.D.    On: 03/09/2020 15:14    ____________________________________________   PROCEDURES  Procedure(s) performed (including Critical Care):  Procedures   ____________________________________________   INITIAL IMPRESSION / ASSESSMENT AND PLAN / ED COURSE  As part of my medical decision making, I reviewed the following data within the electronic MEDICAL RECORD NUMBER     Patient presents with right hip pain and unable to bear weight secondary to a fall last night.  Discussed x-ray findings with patient that is consistent with a displaced fracture of the proximal right femur.  Patient will be admitted by hospitalist for consult to orthopedics.          ____________________________________________   FINAL CLINICAL IMPRESSION(S) / ED DIAGNOSES  Final diagnoses:  Closed right hip fracture, initial encounter [facility])     ED Discharge Orders    None      *Please note:  TASMINE HIPWELL was evaluated in Emergency Department on 03/09/2020 for the symptoms described in the history of present illness. She was evaluated in the context of the global COVID-19 pandemic, which necessitated consideration that the patient might be at risk for infection with the SARS-CoV-2 virus that causes COVID-19. Institutional protocols and algorithms that pertain to the evaluation of patients at risk for COVID-19 are in a state of rapid change based on information released by regulatory bodies including the CDC and federal and state organizations. These policies and algorithms were followed during the patient's care in the ED.  Some ED evaluations and interventions may be delayed as a result of limited staffing during and the pandemic.*   Note:  This document was prepared using Dragon voice recognition software and may include unintentional dictation errors.    Joni Reining, PA-C 03/09/20 1636    Sharman Cheek, MD 03/10/20 1213

## 2020-03-09 NOTE — ED Provider Notes (Signed)
-----------------------------------------   5:07 PM on 03/09/2020 -----------------------------------------  EKG viewed and interpreted by myself shows normal sinus rhythm 82 bpm with a narrow QRS, normal axis, normal intervals, no concerning ST changes.   Minna Antis, MD 03/09/20 [phone]

## 2020-03-09 NOTE — ED Notes (Signed)
Pt changed into hospital gown and all artifacts removed for XRAY, pt clothing given to daughter Carollee Herter) who is at bedside

## 2020-03-09 NOTE — ED Notes (Addendum)
ED TO INPATIENT HANDOFF REPORT  ED Nurse Name and Phone #: Corrie Dandy [phone]   S Name/Age/Gender Michelle Bauer 79 y.o. female Room/Bed: ED42A/ED42A  Code Status   Code Status: Full Code  Home/SNF/Other Home Patient oriented to: self, place, time and situation Is this baseline? Yes   Triage Complete: Triage complete  Chief Complaint Fracture [M01.8XXA]  Triage Note Presents from home s/p fall last pm  Deformity to right hip     Allergies Allergies  Allergen Reactions  . Other Other (See Comments)  . Penicillins Other (See Comments)    Has patient had a PCN reaction causing immediate rash, facial/tongue/throat swelling, SOB or lightheadedness with hypotension: Unknown Has patient had a PCN reaction causing severe rash involving mucus membranes or skin necrosis: Unknown Has patient had a PCN reaction that required hospitalization: Unknown Has patient had a PCN reaction occurring within the last 10 years: No If all of the above answers are "NO", then may proceed with Cephalosporin use.  . Sulfa Antibiotics Other (See Comments)    Level of Care/Admitting Diagnosis ED Disposition    ED Disposition Condition Comment   Admit  Hospital Area: [facility] [[id number]]  Level of Care: Med-Surg [16]  Covid Evaluation: Asymptomatic Screening Protocol (No Symptoms)  Diagnosis: Fracture [027253]  Admitting Physician: [name] [[id number]]  Attending Physician: [name] [[id number]]  Estimated length of stay: past midnight tomorrow  Certification:: I certify this patient will need inpatient services for at least 2 midnights       B Medical/Surgery History Past Medical History:  Diagnosis Date  . Anxiety   . Depression   . Essential hypertension   . Hyperlipidemia    Past Surgical History:  Procedure Laterality Date  . ABDOMINAL HYSTERECTOMY    . BYPASS AXILLA/BRACHIAL ARTERY    . CHOLECYSTECTOMY    . COLONOSCOPY WITH PROPOFOL N/A 03/07/2018    Procedure: COLONOSCOPY WITH PROPOFOL;  Surgeon: Midge Minium, MD;  Location: [facility] ENDOSCOPY;  Service: Endoscopy;  Laterality: N/A;     A IV Location/Drains/Wounds Patient Lines/Drains/Airways Status    Active Line/Drains/Airways    Name Placement date Placement time Site Days   Peripheral IV 03/09/20 Right Antecubital 03/09/20  1444  Antecubital  less than 1          Intake/Output Last 24 hours No intake or output data in the 24 hours ending 03/09/20 1656  Labs/Imaging Results for orders placed or performed during the hospital encounter of 03/09/20 (from the past 48 hour(s))  SARS Coronavirus 2 by RT PCR (hospital order, performed in [facility] hospital lab) Nasopharyngeal Nasopharyngeal Swab     Status: None   Collection Time: 03/09/20  2:31 PM   Specimen: Nasopharyngeal Swab  Result Value Ref Range   SARS Coronavirus 2 NEGATIVE NEGATIVE    Comment: (NOTE) SARS-CoV-2 target nucleic acids are NOT DETECTED.  The SARS-CoV-2 RNA is generally detectable in upper and lower respiratory specimens during the acute phase of infection. The lowest concentration of SARS-CoV-2 viral copies this assay can detect is 250 copies / mL. A negative result does not preclude SARS-CoV-2 infection and should not be used as the sole basis for treatment or other patient management decisions.  A negative result may occur with improper specimen collection / handling, submission of specimen other than nasopharyngeal swab, presence of viral mutation(s) within the areas targeted by this assay, and inadequate number of viral copies (<250 copies / mL). A negative result must be combined with clinical observations, patient  history, and epidemiological information.  Fact Sheet for Patients:   BoilerBrush.com.cy  Fact Sheet for Healthcare Providers: https://pope.com/  This test is not yet approved or  cleared by the [place] FDA and has been authorized  for detection and/or diagnosis of SARS-CoV-2 by FDA under an Emergency Use Authorization (EUA).  This EUA will remain in effect (meaning this test can be used) for the duration of the COVID-19 declaration under Section 564(b)(1) of the Act, 21 U.S.C. section 360bbb-3(b)(1), unless the authorization is terminated or revoked sooner.  Performed at [facility], [address] [place], [place] [address]   CBC with Differential     Status: Abnormal   Collection Time: 03/09/20  2:40 PM  Result Value Ref Range   WBC 8.7 4.0 - 10.5 K/uL   RBC 3.38 (L) 3.87 - 5.11 MIL/uL   Hemoglobin 9.4 (L) 12.0 - 15.0 g/dL   HCT 91.4 (L) 36 - 46 %   MCV 87.6 80.0 - 100.0 fL   MCH 27.8 26.0 - 34.0 pg   MCHC 31.8 30.0 - 36.0 g/dL   RDW 78.2 95.6 - 21.3 %   Platelets 225 150 - 400 K/uL   nRBC 0.0 0.0 - 0.2 %   Neutrophils Relative % 78 %   Neutro Abs 6.8 1.7 - 7.7 K/uL   Lymphocytes Relative 14 %   Lymphs Abs 1.2 0.7 - 4.0 K/uL   Monocytes Relative 7 %   Monocytes Absolute 0.6 0 - 1 K/uL   Eosinophils Relative 0 %   Eosinophils Absolute 0.0 0 - 0 K/uL   Basophils Relative 0 %   Basophils Absolute 0.0 0 - 0 K/uL   Immature Granulocytes 1 %   Abs Immature Granulocytes 0.05 0.00 - 0.07 K/uL    Comment: Performed at [facility], [address] [place], [place] [address]  Comprehensive metabolic panel     Status: Abnormal   Collection Time: 03/09/20  2:40 PM  Result Value Ref Range   Sodium 137 135 - 145 mmol/L   Potassium 3.3 (L) 3.5 - 5.1 mmol/L   Chloride 96 (L) 98 - 111 mmol/L   CO2 27 22 - 32 mmol/L   Glucose, Bld 135 (H) 70 - 99 mg/dL    Comment: Glucose reference range applies only to samples taken after fasting for at least 8 hours.   BUN 24 (H) 8 - 23 mg/dL   Creatinine, Ser 8.46 (H) 0.44 - 1.00 mg/dL   Calcium 8.6 (L) 8.9 - 10.3 mg/dL   Total Protein 7.2 6.5 - 8.1 g/dL   Albumin 2.9 (L) 3.5 - 5.0 g/dL   AST 28 15 - 41 U/L   ALT 12 0 - 44 U/L   Alkaline Phosphatase  72 38 - 126 U/L   Total Bilirubin 0.6 0.3 - 1.2 mg/dL   GFR calc non Af Amer 51 (L) >60 mL/min   GFR calc Af Amer 59 (L) >60 mL/min   Anion gap 14 5 - 15    Comment: Performed at [facility], [address] [place], [place] [address]  Type and screen [facility]     Status: None   Collection Time: 03/09/20  2:40 PM  Result Value Ref Range   ABO/RH(D) O POS    Antibody Screen NEG    Sample Expiration      03/12/2020,2359 Performed at [facility], [address] [place], [place] [address]    DG Chest 1 View  Result Date: 03/09/2020 CLINICAL DATA:  Pain EXAM: CHEST  1 VIEW COMPARISON:  None. FINDINGS: There is a significant S shaped curvature of the thoracolumbar spine. The heart size appears mildly enlarged. The patient is status post prior median sternotomy. There is no pneumothorax or large pleural effusion. There is no acute osseous abnormality. IMPRESSION: No active disease. Electronically Signed   By: Katherine Mantle M.D.   On: 03/09/2020 15:15   DG Hip Unilat W or Wo Pelvis 2-3 Views Right  Result Date: 03/09/2020 CLINICAL DATA:  Pain EXAM: DG HIP (WITH OR WITHOUT PELVIS) 2-3V RIGHT COMPARISON:  None. FINDINGS: There is an acute displaced intratrochanteric fracture of the proximal right femur. There is no dislocation. There is osteopenia. 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 Vascular calcifications are noted. IMPRESSION: Acute displaced intratrochanteric fracture of the proximal right femur. Electronically Signed   By: Katherine Mantle M.D.   On: 03/09/2020 15:14    Pending Labs Unresulted Labs (From admission, onward)          Start     Ordered   03/10/20 0500  CBC  Tomorrow morning,   STAT        03/09/20 1648   03/10/20 0500  Basic metabolic panel  Tomorrow morning,   STAT        03/09/20 1648   03/09/20 1631  Protime-INR  ONCE - STAT,   STAT        03/09/20 1632   03/09/20 1630  APTT  ONCE - STAT,   STAT         03/09/20 1632          Vitals/Pain Today's Vitals   03/09/20 1427 03/09/20 1428  BP: 137/78   Pulse: 78   Resp: 18   Temp: 98.3 F (36.8 C)   TempSrc: Oral   SpO2: 97%   Weight:  56 kg  Height:  5\' 5"  (1.651 m)  PainSc:  6     Isolation Precautions No active isolations  Medications Medications  atorvastatin (LIPITOR) tablet 40 mg (has no administration in time range)  omega-3 acid ethyl esters (LOVAZA) capsule 1 g (has no administration in time range)  clonazePAM (KLONOPIN) tablet 0.5 mg (has no administration in time range)  One-A-Day Womens 50 Plus TABS 1 tablet (has no administration in time range)  HYDROcodone-acetaminophen (NORCO/VICODIN) 5-325 MG per tablet 1-2 tablet (has no administration in time range)  morphine 2 MG/ML injection 0.5 mg (has no administration in time range)  senna-docusate (Senokot-S) tablet 1 tablet (has no administration in time range)  bisacodyl (DULCOLAX) EC tablet 5 mg (has no administration in time range)  HYDROmorphone (DILAUDID) injection 0.5 mg (0.5 mg Intravenous Given 03/09/20 1557)  ondansetron (ZOFRAN) injection 4 mg (4 mg Intravenous Given 03/09/20 1556)    Mobility walks Low fall risk   Focused Assessments Cardiac Assessment Handoff:    Lab Results  Component Value Date   TROPONINI <0.03 03/05/2018   No results found for: DDIMER Does the Patient currently have chest pain? no    R Recommendations: See Admitting Provider Note  Report given to:   Additional Notes: Pt normally ambulatory

## 2020-03-09 NOTE — ED Triage Notes (Signed)
Presents from home s/p fall last pm  Deformity to right hip

## 2020-03-10 ENCOUNTER — Inpatient Hospital Stay: Payer: Medicare Other | Admitting: Anesthesiology

## 2020-03-10 ENCOUNTER — Inpatient Hospital Stay: Payer: Medicare Other

## 2020-03-10 ENCOUNTER — Encounter
Admission: EM | Disposition: A | Discharge status: Skilled Nursing Facility | Payer: Self-pay | Source: Home / Self Care | Attending: Internal Medicine

## 2020-03-10 DIAGNOSIS — R739 Hyperglycemia, unspecified: Secondary | ICD-10-CM

## 2020-03-10 DIAGNOSIS — F411 Generalized anxiety disorder: Secondary | ICD-10-CM

## 2020-03-10 DIAGNOSIS — D649 Anemia, unspecified: Secondary | ICD-10-CM

## 2020-03-10 HISTORY — PX: INTRAMEDULLARY (IM) NAIL INTERTROCHANTERIC: SHX5875

## 2020-03-10 LAB — SURGICAL PCR SCREEN
MRSA, PCR: NEGATIVE
Staphylococcus aureus: POSITIVE — AB

## 2020-03-10 LAB — BASIC METABOLIC PANEL
Anion gap: 9 (ref 5–15)
BUN: 23 mg/dL (ref 8–23)
CO2: 27 mmol/L (ref 22–32)
Calcium: 8.6 mg/dL — ABNORMAL LOW (ref 8.9–10.3)
Chloride: 99 mmol/L (ref 98–111)
Creatinine, Ser: 0.92 mg/dL (ref 0.44–1.00)
GFR calc Af Amer: >60 mL/min (ref >60)
GFR calc non Af Amer: >60 mL/min (ref >60)
Glucose, Bld: 125 mg/dL — ABNORMAL HIGH (ref 70–99)
Potassium: 3.7 mmol/L (ref 3.5–5.1)
Sodium: 135 mmol/L (ref 135–145)

## 2020-03-10 LAB — CBC
HCT: 30.1 % — ABNORMAL LOW (ref 36.0–46.0)
Hemoglobin: 10.1 g/dL — ABNORMAL LOW (ref 12.0–15.0)
MCH: 28.6 pg (ref 26.0–34.0)
MCHC: 33.6 g/dL (ref 30.0–36.0)
MCV: 85.3 fL (ref 80.0–100.0)
Platelets: 223 10*3/uL (ref 150–400)
RBC: 3.53 MIL/uL — ABNORMAL LOW (ref 3.87–5.11)
RDW: 13.4 % (ref 11.5–15.5)
WBC: 9.9 10*3/uL (ref 4.0–10.5)
nRBC: 0 % (ref 0.0–0.2)

## 2020-03-10 SURGERY — FIXATION, FRACTURE, INTERTROCHANTERIC, WITH INTRAMEDULLARY ROD
Anesthesia: General | Site: Hip | Laterality: Right

## 2020-03-10 MED ORDER — CEFAZOLIN SODIUM-DEXTROSE 2-4 GM/100ML-% IV SOLN
INTRAVENOUS | Status: AC
  Filled 2020-03-10: qty 100

## 2020-03-10 MED ORDER — PHENYLEPHRINE HCL (PRESSORS) 10 MG/ML IV SOLN
INTRAVENOUS | Status: DC | PRN
  Administered 2020-03-10: 100 ug via INTRAVENOUS

## 2020-03-10 MED ORDER — PROMETHAZINE HCL 25 MG/ML IJ SOLN: 12.5000 mg | Freq: Four times a day (QID) | INTRAMUSCULAR | Status: DC | PRN

## 2020-03-10 MED ORDER — ALUM & MAG HYDROXIDE-SIMETH 200-200-20 MG/5ML PO SUSP: 30.0000 mL | ORAL | Status: DC | PRN

## 2020-03-10 MED ORDER — ACETAMINOPHEN 10 MG/ML IV SOLN
INTRAVENOUS | Status: DC | PRN
  Administered 2020-03-10: 750 mg via INTRAVENOUS

## 2020-03-10 MED ORDER — HYDROCODONE-ACETAMINOPHEN 5-325 MG PO TABS
1.0000 | ORAL_TABLET | ORAL | Status: DC | PRN
  Administered 2020-03-17: 1 via ORAL
  Filled 2020-03-10: qty 1

## 2020-03-10 MED ORDER — DOCUSATE SODIUM 100 MG PO CAPS
100.0000 mg | ORAL_CAPSULE | Freq: Two times a day (BID) | ORAL | Status: DC
  Administered 2020-03-12 – 2020-03-17 (×9): 100 mg via ORAL
  Filled 2020-03-10 (×13): qty 1

## 2020-03-10 MED ORDER — ONDANSETRON HCL 4 MG PO TABS: 4.0000 mg | ORAL_TABLET | Freq: Four times a day (QID) | ORAL | Status: DC | PRN

## 2020-03-10 MED ORDER — PHENOL 1.4 % MT LIQD
1.0000 | OROMUCOSAL | Status: DC | PRN
  Filled 2020-03-10: qty 177

## 2020-03-10 MED ORDER — ENOXAPARIN SODIUM 40 MG/0.4ML ~~LOC~~ SOLN
40.0000 mg | SUBCUTANEOUS | Status: DC
  Administered 2020-03-12: 40 mg via SUBCUTANEOUS
  Filled 2020-03-10 (×2): qty 0.4

## 2020-03-10 MED ORDER — FENTANYL CITRATE (PF) 100 MCG/2ML IJ SOLN
INTRAMUSCULAR | Status: DC | PRN
Start: 2020-03-10 — End: 2020-03-10
  Administered 2020-03-10 (×6): 25 ug via INTRAVENOUS

## 2020-03-10 MED ORDER — MENTHOL 3 MG MT LOZG
1.0000 | LOZENGE | OROMUCOSAL | Status: DC | PRN
  Filled 2020-03-10: qty 9

## 2020-03-10 MED ORDER — ONDANSETRON HCL 4 MG/2ML IJ SOLN
4.0000 mg | Freq: Four times a day (QID) | INTRAMUSCULAR | Status: DC | PRN
  Administered 2020-03-12: 4 mg via INTRAVENOUS
  Filled 2020-03-10: qty 2

## 2020-03-10 MED ORDER — MORPHINE SULFATE (PF) 2 MG/ML IV SOLN: 0.5000 mg | INTRAVENOUS | Status: DC | PRN

## 2020-03-10 MED ORDER — CEFAZOLIN SODIUM-DEXTROSE 2-4 GM/100ML-% IV SOLN
2.0000 g | Freq: Four times a day (QID) | INTRAVENOUS | Status: AC
  Administered 2020-03-11 (×2): 2 g via INTRAVENOUS
  Filled 2020-03-10 (×2): qty 100

## 2020-03-10 MED ORDER — SUCCINYLCHOLINE CHLORIDE 20 MG/ML IJ SOLN
INTRAMUSCULAR | Status: DC | PRN
  Administered 2020-03-10: 100 mg via INTRAVENOUS

## 2020-03-10 MED ORDER — CEFAZOLIN SODIUM-DEXTROSE 2-4 GM/100ML-% IV SOLN
2.0000 g | INTRAVENOUS | Status: AC
  Administered 2020-03-10: 2 g via INTRAVENOUS

## 2020-03-10 MED ORDER — TRAMADOL HCL 50 MG PO TABS
100.0000 mg | ORAL_TABLET | Freq: Three times a day (TID) | ORAL | Status: DC
  Administered 2020-03-11 – 2020-03-17 (×18): 100 mg via ORAL
  Filled 2020-03-10 (×19): qty 2

## 2020-03-10 MED ORDER — FENTANYL CITRATE (PF) 100 MCG/2ML IJ SOLN
INTRAMUSCULAR | Status: AC
  Filled 2020-03-10: qty 2

## 2020-03-10 MED ORDER — ESCITALOPRAM OXALATE 10 MG PO TABS
5.0000 mg | ORAL_TABLET | Freq: Every day | ORAL | Status: DC
  Administered 2020-03-11 – 2020-03-17 (×7): 5 mg via ORAL
  Filled 2020-03-10 (×7): qty 0.5

## 2020-03-10 MED ORDER — ACETAMINOPHEN 10 MG/ML IV SOLN
INTRAVENOUS | Status: AC
  Filled 2020-03-10: qty 100

## 2020-03-10 MED ORDER — MAGNESIUM CITRATE PO SOLN
1.0000 | Freq: Once | ORAL | Status: DC | PRN
  Filled 2020-03-10: qty 296

## 2020-03-10 MED ORDER — SENNA 8.6 MG PO TABS
1.0000 | ORAL_TABLET | Freq: Two times a day (BID) | ORAL | Status: DC
  Administered 2020-03-11 – 2020-03-17 (×9): 8.6 mg via ORAL
  Filled 2020-03-10 (×13): qty 1

## 2020-03-10 MED ORDER — METOPROLOL TARTRATE 5 MG/5ML IV SOLN
INTRAVENOUS | Status: DC | PRN
  Administered 2020-03-10: 2 mg via INTRAVENOUS

## 2020-03-10 MED ORDER — SODIUM CHLORIDE 0.9 % IR SOLN
Status: DC | PRN
  Administered 2020-03-10: 200 mL

## 2020-03-10 MED ORDER — DEXAMETHASONE SODIUM PHOSPHATE 10 MG/ML IJ SOLN
INTRAMUSCULAR | Status: DC | PRN
  Administered 2020-03-10: 4 mg via INTRAVENOUS

## 2020-03-10 MED ORDER — ESCITALOPRAM OXALATE 10 MG PO TABS
10.0000 mg | ORAL_TABLET | Freq: Every day | ORAL | Status: DC
  Filled 2020-03-10: qty 1

## 2020-03-10 MED ORDER — ACETAMINOPHEN 325 MG PO TABS
325.0000 mg | ORAL_TABLET | Freq: Four times a day (QID) | ORAL | Status: DC | PRN
  Administered 2020-03-13: 650 mg via ORAL
  Filled 2020-03-10: qty 2

## 2020-03-10 MED ORDER — LIDOCAINE HCL (CARDIAC) PF 100 MG/5ML IV SOSY
PREFILLED_SYRINGE | INTRAVENOUS | Status: DC | PRN
  Administered 2020-03-10: 70 mg via INTRAVENOUS

## 2020-03-10 MED ORDER — BISACODYL 10 MG RE SUPP: 10.0000 mg | Freq: Every day | RECTAL | Status: DC | PRN

## 2020-03-10 MED ORDER — MUPIROCIN 2 % EX OINT
1.0000 "application " | TOPICAL_OINTMENT | Freq: Two times a day (BID) | CUTANEOUS | Status: AC
  Administered 2020-03-10 – 2020-03-13 (×5): 1 via NASAL
  Filled 2020-03-10 (×2): qty 22

## 2020-03-10 MED ORDER — KETAMINE HCL 50 MG/ML IJ SOLN
INTRAMUSCULAR | Status: AC
  Filled 2020-03-10: qty 10

## 2020-03-10 MED ORDER — POLYETHYLENE GLYCOL 3350 17 G PO PACK: 17.0000 g | PACK | Freq: Every day | ORAL | Status: DC | PRN

## 2020-03-10 MED ORDER — METHOCARBAMOL 500 MG PO TABS
500.0000 mg | ORAL_TABLET | Freq: Four times a day (QID) | ORAL | Status: DC | PRN
  Filled 2020-03-10: qty 1

## 2020-03-10 MED ORDER — PROPOFOL 10 MG/ML IV BOLUS
INTRAVENOUS | Status: DC | PRN
  Administered 2020-03-10: 80 mg via INTRAVENOUS
  Administered 2020-03-10: 30 mg via INTRAVENOUS

## 2020-03-10 MED ORDER — ONDANSETRON HCL 4 MG/2ML IJ SOLN
4.0000 mg | Freq: Four times a day (QID) | INTRAMUSCULAR | Status: DC | PRN
  Administered 2020-03-10: 4 mg via INTRAVENOUS
  Filled 2020-03-10: qty 2

## 2020-03-10 MED ORDER — METOPROLOL TARTRATE 5 MG/5ML IV SOLN
INTRAVENOUS | Status: AC
  Filled 2020-03-10: qty 5

## 2020-03-10 MED ORDER — ONDANSETRON HCL 4 MG/2ML IJ SOLN: 4.0000 mg | Freq: Once | INTRAMUSCULAR | Status: DC | PRN

## 2020-03-10 MED ORDER — PHENYLEPHRINE HCL-NACL 10-0.9 MG/250ML-% IV SOLN
INTRAVENOUS | Status: DC | PRN
  Administered 2020-03-10: 25 ug/min via INTRAVENOUS

## 2020-03-10 MED ORDER — ACETAMINOPHEN 500 MG PO TABS
500.0000 mg | ORAL_TABLET | Freq: Four times a day (QID) | ORAL | Status: AC
  Administered 2020-03-11: 500 mg via ORAL
  Filled 2020-03-10: qty 1

## 2020-03-10 MED ORDER — FENTANYL CITRATE (PF) 100 MCG/2ML IJ SOLN: 25.0000 ug | INTRAMUSCULAR | Status: DC | PRN

## 2020-03-10 MED ORDER — SODIUM CHLORIDE 0.9 % IV SOLN
75.0000 mL/h | INTRAVENOUS | Status: DC
  Administered 2020-03-10 – 2020-03-11 (×3): 75 mL/h via INTRAVENOUS

## 2020-03-10 MED ORDER — FERROUS SULFATE 325 (65 FE) MG PO TABS
325.0000 mg | ORAL_TABLET | Freq: Three times a day (TID) | ORAL | Status: DC
  Administered 2020-03-12 – 2020-03-17 (×17): 325 mg via ORAL
  Filled 2020-03-10 (×18): qty 1

## 2020-03-10 MED ORDER — GENTAMICIN SULFATE 40 MG/ML IJ SOLN
INTRAMUSCULAR | Status: AC
  Filled 2020-03-10: qty 2

## 2020-03-10 MED ORDER — PROPOFOL 500 MG/50ML IV EMUL
INTRAVENOUS | Status: DC | PRN
  Administered 2020-03-10: 25 ug/kg/min via INTRAVENOUS

## 2020-03-10 MED ORDER — CHLORHEXIDINE GLUCONATE CLOTH 2 % EX PADS
6.0000 | MEDICATED_PAD | Freq: Every day | CUTANEOUS | Status: AC
  Administered 2020-03-13: 6 via TOPICAL

## 2020-03-10 MED ORDER — METHOCARBAMOL 1000 MG/10ML IJ SOLN
500.0000 mg | Freq: Four times a day (QID) | INTRAVENOUS | Status: DC | PRN
  Filled 2020-03-10: qty 5

## 2020-03-10 MED ORDER — KETAMINE HCL 10 MG/ML IJ SOLN
INTRAMUSCULAR | Status: DC | PRN
  Administered 2020-03-10: 20 mg via INTRAVENOUS

## 2020-03-10 SURGICAL SUPPLY — 44 items
BIT DRILL 4.3MMS DISTAL GRDTED (BIT) ×1 IMPLANT
BNDG COHESIVE 4X5 TAN STRL (GAUZE/BANDAGES/DRESSINGS) ×9 IMPLANT
BNDG COHESIVE 6X5 TAN STRL LF (GAUZE/BANDAGES/DRESSINGS) ×6 IMPLANT
CANISTER SUCT 1200ML W/VALVE (MISCELLANEOUS) ×3 IMPLANT
COVER WAND RF STERILE (DRAPES) ×3 IMPLANT
DRAPE 3/4 80X56 (DRAPES) ×6 IMPLANT
DRAPE INCISE IOBAN 66X60 STRL (DRAPES) ×3 IMPLANT
DRAPE SURG 17X11 SM STRL (DRAPES) ×12 IMPLANT
DRAPE U-SHAPE 47X51 STRL (DRAPES) ×6 IMPLANT
DRILL 4.3MMS DISTAL GRADUATED (BIT) ×3
DRSG OPSITE POSTOP 3X4 (GAUZE/BANDAGES/DRESSINGS) ×6 IMPLANT
DRSG OPSITE POSTOP 4X14 (GAUZE/BANDAGES/DRESSINGS) IMPLANT
DRSG OPSITE POSTOP 4X6 (GAUZE/BANDAGES/DRESSINGS) ×3 IMPLANT
DURAPREP 26ML APPLICATOR (WOUND CARE) ×6 IMPLANT
ELECT REM PT RETURN 9FT ADLT (ELECTROSURGICAL) ×3
ELECTRODE REM PT RTRN 9FT ADLT (ELECTROSURGICAL) ×1 IMPLANT
GLOVE BIOGEL PI IND STRL 9 (GLOVE) ×2 IMPLANT
GLOVE BIOGEL PI INDICATOR 9 (GLOVE) ×4
GLOVE SURG 9.0 ORTHO LTXF (GLOVE) ×6 IMPLANT
GOWN STRL REUS TWL 2XL XL LVL4 (GOWN DISPOSABLE) ×3 IMPLANT
GOWN STRL REUS W/ TWL LRG LVL3 (GOWN DISPOSABLE) ×1 IMPLANT
GOWN STRL REUS W/TWL LRG LVL3 (GOWN DISPOSABLE) ×2
GUIDEWIRE BALL NOSE 100CM (WIRE) ×3 IMPLANT
HEMOVAC 400CC 10FR (MISCELLANEOUS) IMPLANT
HIP FRA NAIL LAG SCREW 10.5X90 (Orthopedic Implant) ×3 IMPLANT
KIT TURNOVER CYSTO (KITS) ×3 IMPLANT
MAT ABSORB  FLUID 56X50 GRAY (MISCELLANEOUS) ×2
MAT ABSORB FLUID 56X50 GRAY (MISCELLANEOUS) ×1 IMPLANT
NAIL HIP FRACTURE 11X380MM (Nail) ×3 IMPLANT
NS IRRIG 1000ML POUR BTL (IV SOLUTION) ×3 IMPLANT
PACK HIP COMPR (MISCELLANEOUS) ×3 IMPLANT
PAD ARMBOARD 7.5X6 YLW CONV (MISCELLANEOUS) ×3 IMPLANT
PIN GUIDE DRILL TIP 2.8X300 (DRILL) ×3 IMPLANT
SCREW BONE CORTICAL 5.0X36 (Screw) ×3 IMPLANT
SCREW BONE CORTICAL 5.0X40 (Screw) ×3 IMPLANT
SCREW LAG HIP FRA NAIL 10.5X90 (Orthopedic Implant) ×1 IMPLANT
STAPLER SKIN PROX 35W (STAPLE) ×3 IMPLANT
SUCTION FRAZIER HANDLE 10FR (MISCELLANEOUS) ×2
SUCTION TUBE FRAZIER 10FR DISP (MISCELLANEOUS) ×1 IMPLANT
SUT VIC AB 0 CT1 36 (SUTURE) ×6 IMPLANT
SUT VIC AB 2-0 CT1 27 (SUTURE) ×2
SUT VIC AB 2-0 CT1 TAPERPNT 27 (SUTURE) ×1 IMPLANT
SUT VICRYL 0 AB UR-6 (SUTURE) ×6 IMPLANT
SYR 30ML LL (SYRINGE) ×3 IMPLANT

## 2020-03-10 NOTE — Anesthesia Preprocedure Evaluation (Signed)
Anesthesia Evaluation  Patient identified by MRN, date of birth, ID band Patient awake    Reviewed: Allergy & Precautions, H&P , NPO status , Patient's Chart, lab work & pertinent test results, reviewed documented beta blocker date and time   Airway Mallampati: II  TM Distance: >3 FB Neck ROM: full    Dental  (+) Teeth Intact   Pulmonary neg pulmonary ROS,    Pulmonary exam normal        Cardiovascular Exercise Tolerance: Poor hypertension, On Medications + CAD  Normal cardiovascular exam Rhythm:regular Rate:Normal     Neuro/Psych Anxiety Depression negative neurological ROS  negative psych ROS   GI/Hepatic negative GI ROS, Neg liver ROS, Currently nauseated with emesis. 1251pm..JA   Endo/Other  negative endocrine ROS  Renal/GU negative Renal ROS  negative genitourinary   Musculoskeletal   Abdominal   Peds  Hematology negative hematology ROS (+)   Anesthesia Other Findings Past Medical History: No date: Anxiety No date: Depression No date: Essential hypertension No date: Hyperlipidemia Past Surgical History: No date: ABDOMINAL HYSTERECTOMY No date: BYPASS AXILLA/BRACHIAL ARTERY No date: CHOLECYSTECTOMY 03/07/2018: COLONOSCOPY WITH PROPOFOL; N/A     Comment:  Procedure: COLONOSCOPY WITH PROPOFOL;  Surgeon: Midge Minium, MD;  Location: [facility] ENDOSCOPY;  Service:               Endoscopy;  Laterality: N/A; BMI    Body Mass Index: 20.54 kg/m     Reproductive/Obstetrics negative OB ROS                             Anesthesia Physical Anesthesia Plan  ASA: III and emergent  Anesthesia Plan: General ETT   Post-op Pain Management:    Induction:   PONV Risk Score and Plan: 4 or greater  Airway Management Planned:   Additional Equipment:   Intra-op Plan:   Post-operative Plan:   Informed Consent: I have reviewed the patients History and Physical, chart, labs  and discussed the procedure including the risks, benefits and alternatives for the proposed anesthesia with the patient or authorized representative who has indicated his/her understanding and acceptance.     Dental Advisory Given  Plan Discussed with: CRNA  Anesthesia Plan Comments:         Anesthesia Quick Evaluation

## 2020-03-10 NOTE — Anesthesia Procedure Notes (Signed)
Procedure Name: Intubation Date/Time: 03/10/2020 1:08 PM Performed by: Henrietta Hoover, CRNA Pre-anesthesia Checklist: Patient identified, Emergency Drugs available, Suction available and Patient being monitored Patient Re-evaluated:Patient Re-evaluated prior to induction Oxygen Delivery Method: Circle system utilized Preoxygenation: Pre-oxygenation with 100% oxygen Induction Type: IV induction, Cricoid Pressure applied and Rapid sequence Ventilation: Mask ventilation without difficulty Laryngoscope Size: 3 and McGraph Grade View: Grade I Tube type: Oral Tube size: 6.5 mm Number of attempts: 1 Airway Equipment and Method: Stylet and Video-laryngoscopy Placement Confirmation: ETT inserted through vocal cords under direct vision,  positive ETCO2 and breath sounds checked- equal and bilateral Secured at: 20 cm Tube secured with: Tape Dental Injury: Teeth and Oropharynx as per pre-operative assessment

## 2020-03-10 NOTE — Op Note (Signed)
03/09/2020 - 03/10/2020  3:36 PM  PATIENT:  Michelle Bauer    PRE-OPERATIVE DIAGNOSIS:  RIGHT INTERTROCHANTERIC FRACTURE  POST-OPERATIVE DIAGNOSIS:  Same  PROCEDURE:  INTRAMEDULLARY FIXATION OF RIGHT INTERTROCHANTERIC HIP FRACTURE  SURGEON:  Juanell Fairly, MD  ANESTHESIA:   general  EBL:  50cc  IMPLANT:  ZIMMER BIOMET AFFIXUS NAIL 53mm x with a 68mm lag screw and distal interlocking screws 16mm  and 36 mm in length.  PREOPERATIVE INDICATIONS:  Michelle Bauer is a  79 y.o. female with a diagnosis of RIGHT INTERTROCHANTERIC FRACTURE.  Given the displacement of fracture the patient was recommended for intramedullary fixation.  The risks, benefits and alternatives were discussed with the patient and their family.  The risks include but are not limited to infection, bleeding requiring blood transfusion, nerve or blood vessel injury, malunion, nonunion, hardware prominence, hardware failure, leg length discrepancy or change in lower extremity rotation and need for further surgery including hardware removal with conversion to a total hip arthroplasty. Medical risks include but are not limited to DVT and pulmonary embolism, myocardial infarction, stroke, pneumonia, respiratory failure and death. The patient's daughter understood the risks and wished to proceed with surgery.  OPERATIVE PROCEDURE:  The patient was brought to the operating room and placed in the supine position on the fracture table. The patient received general anesthesia.  A closed reduction was performed under C-arm guidance.  The fracture reduction was confirmed on both AP and lateral views. After adequate reduction was achieved, a time out was performed to verify the patient's name, date of birth, medical record number, correct site of surgery correct procedure to be performed. The timeout was also used to verify the patient received antibiotics and all appropriate instruments, implants and radiographic studies were  available in the room. Once all in attendance were in agreement, the case began. The patient was prepped and draped in a sterile fashion. She received preoperative antibiotics with 2 g of Ancef IV.  Patient had no adverse reaction to Ancef despite her penicillin allergy.  An incision was made proximal to the greater trochanter in line with the femur. A guidewire was placed over the tip of the greater trochanter and advanced by drill into the proximal femur to the level of the lesser trochanter.  Confirmation of the drill pin position was made on AP and lateral C-arm images.  The threaded guidepin was then overdrilled with the proximal femoral entry reamer.  A ball-tipped guidewire was then advanced down the intramedullary canal, across the fracture, and down the femoral shaft to the knee.  The ball tip guidewire's position was confirmed at both the knee and hip via C-arm imaging. A depth gauge was used to measure the length of the long nail to be used. It was measured to be 380 mm. The actual nail was then inserted into the proximal femur, across the fracture site and down the femoral shaft. Its position was confirmed on AP and lateral C-arm images.  The ball tip guidewire was removed.  Once the nail was completely seated, the drill guide for the lag screw was placed through the guide arm for the Affixus nail. A guidepin was then placed through this drill guide and advanced through the lateral cortex of the femur, across the fracture site and into the femoral head achieving a tip apex distance of less than 25 mm. The length of the drill pin was measured to be 90 mm.  The drill for the lag screw was advanced through  the lateral cortex, across the fracture site and up into the femoral head to the depth of the lag screw.  The lag screw was then advanced by hand into position across the fracture site into the femoral head. Its final position was confirmed on AP and lateral C-arm images. Compression was applied as  traction was carefully released. The set screw in the top of the intramedullary rod was tightened by hand using a screwdriver. It was backed off a quarter turn to allow for compression at the fracture site.  The attention was then turned to placement of the distal interlocking screws. A perfect circle technique was used. Two small stab incisions were made over the distal interlocking screw holes.  A free hand technique was used to drill both distal interlocking screws. The depth of the screw holes was measured with a depth gauge. The 20mm and 81mm screws were then advanced into position and tightened by hand. Final C-arm images of the entire intramedullary construct were taken in both the AP and lateral planes.   The wounds were irrigated copiously and closed with 0 Vicryl for closure of the deep fascia and 2-0 Vicryl for subcutaneous closure. The skin was approximated with staples. A dry sterile dressing was applied. I was scrubbed and present the entire case and all sharp, sponge and instrument counts were correct at the conclusion of the case. Patient was transferred to a hospital bed and brought to PACU in stable condition.     Kathreen Devoid, MD

## 2020-03-10 NOTE — Transfer of Care (Signed)
Immediate Anesthesia Transfer of Care Note  Patient: Michelle Bauer  Procedure(s) Performed: INTRAMEDULLARY (IM) NAIL INTERTROCHANTRIC (Right Hip)  Patient Location: PACU  Anesthesia Type:General  Level of Consciousness: drowsy  Airway & Oxygen Therapy: Patient Spontanous Breathing and Patient connected to face mask oxygen  Post-op Assessment: Report given to RN and Post -op Vital signs reviewed and stable  Post vital signs: Reviewed and stable  Last Vitals:  Vitals Value Taken Time  BP 163/72   Temp 36.5 C   Pulse 83   Resp 18   SpO2 100 %     Last Pain:  Vitals:   03/10/20 1514  TempSrc:   PainSc: (P) 0-No pain         Complications: No complications documented.

## 2020-03-10 NOTE — Progress Notes (Addendum)
PROGRESS NOTE    Michelle Bauer  VZD:[id number] DOB: [birth year] DOA: 03/09/2020 PCP: [name], MD   Brief Narrative:  HPI per Dr. [name] on 03/09/20 Michelle Bauer is a 79 y.o. female with medical history significant of 80 year old female with past medical history of hypertension, hyperlipidemia, anxiety/depression, severe scoliosis, diverticular bleed presented status post mechanical fall which occurred yesterday.  Patient was on her usual health and her daughter was coming to visit her.  She went to unlock to double lock since her daughter did not have the key to the door and she reports a chair was pulled out in the kitchen and somehow she stumbled over it and fell.  She did hit her head against a wall.  Daughter arrived 30 minutes later meanwhile patient was on the floor.  This occurred yesterday and patient refused to come to the ER.  Daughter realized patient is unable to walk with a call patient's PCP and PCP recommended them to call EMS. She reports pain of rt hip as she points to it. Denies any other pain.  She denies headache, vision change, chest pain or shortness of breath.  Daughter thinks she is starting to have some dementia signs but has not been evaluated.  Patient reports chronic diarrhea however now is more formed and soft with Metamucil as she takes it every day.  Patient is Covid negative.  However she has not had her vaccination yet.  Daughters have been vaccinated.  ED Course: Blood pressure 137/78 afebrile, on room air 97%  Review of Systems: All systems reviewed and otherwise negative.  Potassium 3.3, creatinine 1.05, glucose 135 BUN 24 WBC 8.7 hemoglobin 9.4 hematocrit 29.6 MCV 87.6  Hip x-ray revealed acute displaced intertrochanteric fracture of the proximal right femur  Chest x-ray personally reviewed no acute cardiopulmonary disease EKG personally reviewed sinus rhythm no ischemic ST changes  **Interim History Patient is to undergo  operative procedure later today exception for the patient's hip fracture.  Dr. [name] to operate later today.  Patient was complaining of nausea last night but none today.  Assessment & Plan:   Active Problems:   Generalized anxiety disorder   HLD (hyperlipidemia)   Fracture   Essential hypertension   Hypokalemia  Acute Right Proximal Femur Fracture in the setting of Mechanical Fall -Admitted to inpatient MedSurg  -Hip X-Ray showed "Acute displaced intratrochanteric fracture of the proximal right femur." -ED spoke to Dr. [name] of Orthopedics and plan is for Surgical intervention today (scheduled for 1 pm) -Continue with pain management and was given half a milligram of Dilaudid once in the ED.  Continue with hydrocodone-acetaminophen 1-2 tabs p.o. every 6 hours as needed moderate pain, IV morphine 0.5 mg every 2 as needed severe pain -Continue with supportive care and antiemetics with IV Zofran 4 mg every 6 hours as needed nausea or vomiting -Currently she is n.p.o. after midnight -Bowel regimen initiated and she is on psyllium 1 packet p.o. daily, senna docusate 1 tab p.o. nightly as needed for mild constipation, as well as bisacodyl 5 mg p.o. daily as needed for moderate constipation adjust as necessary -Continue IV fluid hydration with lactated Ringer's at 75 mils per hour for now and discontinue once she is out of surgery and eating -Head CT obtained since she did fall and showed age indeterminate small vessel ischemic changes in the white matter which was favored to be chronic and no acute intra cranial trauma noted. -Deferring pharmacological VTE prophylaxis to orthopedic surgery;  currently on SCDs and will  Essential Hypertension -On hydrochlorothiazide PTA only which will be held -Blood pressure currently stable and since she is going to surgery will hold off and place her on IV -hydralazine as needed for systolic blood pressure more than 160 -Hold off on HCTZ  Also because  of hypokalemia -Last BP reading was 162/72  Hyperlipidemia -LFTs normal on admission with an AST of 28 and ALT of 12 -Continue home Atorvastatin 40 mg po qHS and Omega-3 Acid Ethyl Esters 1 gram po Daily with Lunch  Hypokalemia  -K+ on Admission was 3.3 and after repletion with po KCl 40 mEQ it is now 3.7 -Continue to Monitor and Replete as Necessary -Repeat BMP in the AM   Depression/Anxiety -C/w Escitalopram 5 mg po BID -Pharmacy has verified the patient takes clonazepam 0.5 mg 4 times daily and this will be resumed  Normocytic Anemia -Patient's Hgb/Hct went from 9.4/29.6 -> 10.1/30.1 -Check Anemia Panel in the AM -Continue to Monitor for S/Sx of Bleeding; Currently no overt bleeding noted -Expect to drop some post-operatively -Repeat CBC in the AM  Hyperglycemia  -Paitent's Blood Sugar on Admission was 135 and repeat was 125 -Check HbA1c -Continue to Monitor Blood Sugars carefully and if necessary will place on Sensitive Novolog SSI  DVT prophylaxis: SCDs Code Status: DO NOT RESUSCITATE  Family Communication: Spoke to daughter at bedside Disposition Plan: Pending Ortho clearance as well as evaluation by PT and OT  Status is: Inpatient  Remains inpatient appropriate because:Unsafe d/c plan, IV treatments appropriate due to intensity of illness or inability to take PO and Inpatient level of care appropriate due to severity of illness   Dispo: The patient is from: Home              Anticipated d/c is to: SNF              Anticipated d/c date is: 2 days              Patient currently is not medically stable to d/c.  Consultants:   Orthopedic Surgery Dr. Juanell Fairly   Procedures:   Antimicrobials:  Anti-infectives (From admission, onward)   None        Subjective: Seen and examined at bedside and states that she is not feeling as well last night and states that she was nauseous and in pain.  States that she is in extreme pain whenever she is moved or  reposition.  No chest pain, lightheadedness or dizziness.  Does not really feel well.  No other concerns or complaints at this time.  Objective: Vitals:   03/09/20 1817 03/10/20 0125 03/10/20 0217 03/10/20 0234  BP: (!) 174/75 (!) 177/83 (!) 173/70 (!) 160/69  Pulse: 90 96 94 94  Resp: 16 17    Temp: 98.3 F (36.8 C) 98.3 F (36.8 C)    TempSrc: Oral Oral    SpO2: 92% 92% 95%   Weight:      Height:        Intake/Output Summary (Last 24 hours) at 03/10/2020 0759 Last data filed at 03/10/2020 0259 Gross per 24 hour  Intake 601.95 ml  Output --  Net 601.95 ml   Filed Weights   03/09/20 1428  Weight: 56 kg   Examination: Physical Exam:  Constitutional: Thin elderly Caucasian female currently in NAD and appears calm and comfortable Eyes: Lids and conjunctivae normal, sclerae anicteric  ENMT: External Ears, Nose appear normal. Grossly normal hearing. Neck: Appears normal, supple, no  cervical masses, normal ROM, no appreciable thyromegaly; no JVD Respiratory: Diminished to auscultation bilaterally, no wheezing, rales, rhonchi or crackles. Normal respiratory effort and patient is not tachypenic. No accessory muscle use.  Unlabored breathing Cardiovascular: RRR, no murmurs / rubs / gallops. S1 and S2 auscultated. No extremity edema. Abdomen: Soft, non-tender, non-distended. Bowel sounds positive.  GU: Deferred. Musculoskeletal: No clubbing / cyanosis of digits/nails.  Pain on right hip palpation the right hip being shortened and externally rotated slightly Skin: No rashes, lesions, ulcers on limited skin evaluation. No induration; Warm and dry.  Neurologic: CN 2-12 grossly intact with no focal deficits. Romberg sign and cerebellar reflexes not assessed.  Psychiatric: Normal judgment and insight. Alert and oriented x 3. Normal mood and appropriate affect.   Data Reviewed: I have personally reviewed following labs and imaging studies  CBC: Recent Labs  Lab 03/09/20 1440  03/10/20 0412  WBC 8.7 9.9  NEUTROABS 6.8  --   HGB 9.4* 10.1*  HCT 29.6* 30.1*  MCV 87.6 85.3  PLT 225 223   Basic Metabolic Panel: Recent Labs  Lab 03/09/20 1440 03/10/20 0412  NA 137 135  K 3.3* 3.7  CL 96* 99  CO2 27 27  GLUCOSE 135* 125*  BUN 24* 23  CREATININE 1.05* 0.92  CALCIUM 8.6* 8.6*   GFR: Estimated Creatinine Clearance: 45.3 mL/min (by C-G formula based on SCr of 0.92 mg/dL). Liver Function Tests: Recent Labs  Lab 03/09/20 1440  AST 28  ALT 12  ALKPHOS 72  BILITOT 0.6  PROT 7.2  ALBUMIN 2.9*   No results for input(s): LIPASE, AMYLASE in the last 168 hours. No results for input(s): AMMONIA in the last 168 hours. Coagulation Profile: Recent Labs  Lab 03/09/20 1823  INR 1.1   Cardiac Enzymes: No results for input(s): CKTOTAL, CKMB, CKMBINDEX, TROPONINI in the last 168 hours. BNP (last 3 results) No results for input(s): PROBNP in the last 8760 hours. HbA1C: No results for input(s): HGBA1C in the last 72 hours. CBG: No results for input(s): GLUCAP in the last 168 hours. Lipid Profile: No results for input(s): CHOL, HDL, LDLCALC, TRIG, CHOLHDL, LDLDIRECT in the last 72 hours. Thyroid Function Tests: No results for input(s): TSH, T4TOTAL, FREET4, T3FREE, THYROIDAB in the last 72 hours. Anemia Panel: No results for input(s): VITAMINB12, FOLATE, FERRITIN, TIBC, IRON, RETICCTPCT in the last 72 hours. Sepsis Labs: No results for input(s): PROCALCITON, LATICACIDVEN in the last 168 hours.  Recent Results (from the past 240 hour(s))  SARS Coronavirus 2 by RT PCR (hospital order, performed in [facility]) Nasopharyngeal Nasopharyngeal Swab     Status: None   Collection Time: 03/09/20  2:31 PM   Specimen: Nasopharyngeal Swab  Result Value Ref Range Status   SARS Coronavirus 2 NEGATIVE NEGATIVE Final    Comment: (NOTE) SARS-CoV-2 target nucleic acids are NOT DETECTED.  The SARS-CoV-2 RNA is generally detectable in upper and  lower respiratory specimens during the acute phase of infection. The lowest concentration of SARS-CoV-2 viral copies this assay can detect is 250 copies / mL. A negative result does not preclude SARS-CoV-2 infection and should not be used as the sole basis for treatment or other patient management decisions.  A negative result may occur with improper specimen collection / handling, submission of specimen other than nasopharyngeal swab, presence of viral mutation(s) within the areas targeted by this assay, and inadequate number of viral copies (<250 copies / mL). A negative result must be combined with clinical observations, patient  history, and epidemiological information.  Fact Sheet for Patients:   BoilerBrush.com.cy  Fact Sheet for Healthcare Providers: https://pope.com/  This test is not yet approved or  cleared by the [place] FDA and has been authorized for detection and/or diagnosis of SARS-CoV-2 by FDA under an Emergency Use Authorization (EUA).  This EUA will remain in effect (meaning this test can be used) for the duration of the COVID-19 declaration under Section 564(b)(1) of the Act, 21 U.S.C. section 360bbb-3(b)(1), unless the authorization is terminated or revoked sooner.  Performed at [facility], [address], [place], [place] [address]   Surgical PCR screen     Status: Abnormal   Collection Time: 03/09/20  9:25 PM   Specimen: Nasal Mucosa; Nasal Swab  Result Value Ref Range Status   MRSA, PCR NEGATIVE NEGATIVE Final   Staphylococcus aureus POSITIVE (A) NEGATIVE Final    Comment: (NOTE) The Xpert SA Assay (FDA approved for NASAL specimens in patients 47 years of age and older), is one component of a comprehensive surveillance program. It is not intended to diagnose infection nor to guide or monitor treatment. Performed at [facility], [address] [place], [place] [address]      RN Pressure Injury Documentation:     Estimated body mass index is 20.54 kg/m as calculated from the following:   Height as of this encounter: 5\' 5"  (1.651 m).   Weight as of this encounter: 56 kg.  Malnutrition Type:      Malnutrition Characteristics:      Nutrition Interventions:    Radiology Studies: DG Chest 1 View  Result Date: 03/09/2020 CLINICAL DATA:  Pain EXAM: CHEST  1 VIEW COMPARISON:  None. FINDINGS: There is a significant S shaped curvature of the thoracolumbar spine. The heart size appears mildly enlarged. The patient is status post prior median sternotomy. There is no pneumothorax or large pleural effusion. There is no acute osseous abnormality. IMPRESSION: No active disease. Electronically Signed   By: 03/11/2020 M.D.   On: 03/09/2020 15:15   CT HEAD WO CONTRAST  Result Date: 03/09/2020 CLINICAL DATA:  03/11/2020 today, hip fracture EXAM: CT HEAD WITHOUT CONTRAST TECHNIQUE: Contiguous axial images were obtained from the base of the skull through the vertex without intravenous contrast. COMPARISON:  None. FINDINGS: Brain: Scattered hypodensities are seen throughout the periventricular white matter consistent with age-indeterminate small vessel ischemic changes, likely chronic. No other signs of acute infarct or hemorrhage. Lateral ventricles and midline structures are unremarkable. No acute extra-axial fluid collections. No mass effect. Vascular: No hyperdense vessel or unexpected calcification. Skull: Normal. Negative for fracture or focal lesion. Sinuses/Orbits: No acute finding. Other: None. IMPRESSION: 1. Age-indeterminate small-vessel ischemic changes in the white matter, favor chronic. 2. No acute intracranial trauma. Electronically Signed   By: Larey Seat M.D.   On: 03/09/2020 17:47   DG Hip Unilat W or Wo Pelvis 2-3 Views Right  Result Date: 03/09/2020 CLINICAL DATA:  Pain EXAM: DG HIP (WITH OR WITHOUT PELVIS) 2-3V RIGHT COMPARISON:  None. FINDINGS:  There is an acute displaced intratrochanteric fracture of the proximal right femur. There is no dislocation. There is osteopenia. 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 Vascular calcifications are noted. IMPRESSION: Acute displaced intratrochanteric fracture of the proximal right femur. Electronically Signed   By: 03/11/2020 M.D.   On: 03/09/2020 15:14   Scheduled Meds: . atorvastatin  40 mg Oral Q supper  . Chlorhexidine Gluconate Cloth  6 each Topical Q0600  . clonazePAM  0.5 mg Oral QID  . escitalopram  5 mg Oral BID  . multivitamin with minerals  1 tablet Oral Daily  . mupirocin ointment  1 application Nasal BID  . omega-3 acid ethyl esters  1 g Oral Q lunch  . potassium chloride  40 mEq Oral Once  . psyllium  1 packet Oral Daily   Continuous Infusions: . lactated ringers 75 mL/hr at 03/09/20 1855    LOS: 1 day   Merlene Laughter, DO [facility] PAGER is on AMION  If 7PM-7AM, please contact night-coverage [email or web address]

## 2020-03-11 ENCOUNTER — Encounter: Payer: Self-pay | Admitting: Orthopedic Surgery

## 2020-03-11 DIAGNOSIS — D72829 Elevated white blood cell count, unspecified: Secondary | ICD-10-CM

## 2020-03-11 DIAGNOSIS — R410 Disorientation, unspecified: Secondary | ICD-10-CM

## 2020-03-11 LAB — CBC WITH DIFFERENTIAL/PLATELET
Abs Immature Granulocytes: 0.09 10*3/uL — ABNORMAL HIGH (ref 0.00–0.07)
Basophils Absolute: 0 10*3/uL (ref 0.0–0.1)
Basophils Relative: 0 %
Eosinophils Absolute: 0 10*3/uL (ref 0.0–0.5)
Eosinophils Relative: 0 %
HCT: 26.3 % — ABNORMAL LOW (ref 36.0–46.0)
Hemoglobin: 8.4 g/dL — ABNORMAL LOW (ref 12.0–15.0)
Immature Granulocytes: 1 %
Lymphocytes Relative: 9 %
Lymphs Abs: 1.1 10*3/uL (ref 0.7–4.0)
MCH: 28.5 pg (ref 26.0–34.0)
MCHC: 31.9 g/dL (ref 30.0–36.0)
MCV: 89.2 fL (ref 80.0–100.0)
Monocytes Absolute: 0.8 10*3/uL (ref 0.1–1.0)
Monocytes Relative: 7 %
Neutro Abs: 9.5 10*3/uL — ABNORMAL HIGH (ref 1.7–7.7)
Neutrophils Relative %: 83 %
Platelets: 204 10*3/uL (ref 150–400)
RBC: 2.95 MIL/uL — ABNORMAL LOW (ref 3.87–5.11)
RDW: 13.6 % (ref 11.5–15.5)
WBC: 11.5 10*3/uL — ABNORMAL HIGH (ref 4.0–10.5)
nRBC: 0 % (ref 0.0–0.2)

## 2020-03-11 LAB — COMPREHENSIVE METABOLIC PANEL
ALT: 12 U/L (ref 0–44)
AST: 21 U/L (ref 15–41)
Albumin: 2.6 g/dL — ABNORMAL LOW (ref 3.5–5.0)
Alkaline Phosphatase: 53 U/L (ref 38–126)
Anion gap: 9 (ref 5–15)
BUN: 24 mg/dL — ABNORMAL HIGH (ref 8–23)
CO2: 28 mmol/L (ref 22–32)
Calcium: 8.3 mg/dL — ABNORMAL LOW (ref 8.9–10.3)
Chloride: 99 mmol/L (ref 98–111)
Creatinine, Ser: 0.93 mg/dL (ref 0.44–1.00)
GFR calc Af Amer: >60 mL/min (ref >60)
GFR calc non Af Amer: 59 mL/min — ABNORMAL LOW (ref >60)
Glucose, Bld: 111 mg/dL — ABNORMAL HIGH (ref 70–99)
Potassium: 3.6 mmol/L (ref 3.5–5.1)
Sodium: 136 mmol/L (ref 135–145)
Total Bilirubin: 0.7 mg/dL (ref 0.3–1.2)
Total Protein: 6.3 g/dL — ABNORMAL LOW (ref 6.5–8.1)

## 2020-03-11 LAB — RETICULOCYTES
Immature Retic Fract: 32.8 % — ABNORMAL HIGH (ref 2.3–15.9)
RBC.: 3.02 MIL/uL — ABNORMAL LOW (ref 3.87–5.11)
Retic Count, Absolute: 88.8 10*3/uL (ref 19.0–186.0)
Retic Ct Pct: 2.9 % (ref 0.4–3.1)

## 2020-03-11 LAB — IRON AND TIBC
Iron: 32 ug/dL (ref 28–170)
Saturation Ratios: 18 % (ref 10.4–31.8)
TIBC: 182 ug/dL — ABNORMAL LOW (ref 250–450)
UIBC: 150 ug/dL

## 2020-03-11 LAB — MAGNESIUM: Magnesium: 1.8 mg/dL (ref 1.7–2.4)

## 2020-03-11 LAB — FOLATE: Folate: 8.2 ng/mL (ref >5.9)

## 2020-03-11 LAB — FERRITIN: Ferritin: 194 ng/mL (ref 11–307)

## 2020-03-11 LAB — HEMOGLOBIN A1C
Hgb A1c MFr Bld: 5.3 % (ref 4.8–5.6)
Mean Plasma Glucose: 105.41 mg/dL

## 2020-03-11 LAB — VITAMIN B12: Vitamin B-12: 128 pg/mL — ABNORMAL LOW (ref 180–914)

## 2020-03-11 LAB — PHOSPHORUS: Phosphorus: 3 mg/dL (ref 2.5–4.6)

## 2020-03-11 MED ORDER — HALOPERIDOL LACTATE 5 MG/ML IJ SOLN
5.0000 mg | Freq: Once | INTRAMUSCULAR | Status: AC
  Administered 2020-03-11: 5 mg via INTRAMUSCULAR

## 2020-03-11 MED ORDER — HALOPERIDOL LACTATE 5 MG/ML IJ SOLN: 2.5000 mg | Freq: Four times a day (QID) | INTRAMUSCULAR | Status: DC | PRN

## 2020-03-11 MED ORDER — MAGNESIUM SULFATE 2 GM/50ML IV SOLN
2.0000 g | Freq: Once | INTRAVENOUS | Status: AC
  Administered 2020-03-11: 2 g via INTRAVENOUS
  Filled 2020-03-11: qty 50

## 2020-03-11 MED ORDER — HALOPERIDOL LACTATE 5 MG/ML IJ SOLN
INTRAMUSCULAR | Status: AC
  Filled 2020-03-11: qty 1

## 2020-03-11 MED ORDER — HALOPERIDOL LACTATE 5 MG/ML IJ SOLN: 5.0000 mg | Freq: Once | INTRAMUSCULAR | Status: DC

## 2020-03-11 NOTE — Progress Notes (Signed)
Subjective:  POD #1 s/p medullary fixation for right intertrochanteric hip fracture.   Patient confused but not agitated.  Her daughter is at the bedside.  She does not feel that her mother is in significant pain.  Objective:   VITALS:   Vitals:   03/10/20 2120 03/10/20 2221 03/11/20 0006 03/11/20 0805  BP: (!) 166/79 (!) 177/90 (!) 182/80 (!) 158/63  Pulse: 78 77 94 91  Resp: 17 15 16 18   Temp: 99.5 F (37.5 C) 99 F (37.2 C) 98.5 F (36.9 C) 98.4 F (36.9 C)  TempSrc:    Oral  SpO2: 100% 100% 98% 94%  Weight:      Height:        PHYSICAL EXAM: Right lower extremity: Cannot assess sensory or motor function given the patient's confusion. Intact pulses distally Incision: no drainage No cellulitis present Compartment soft  LABS  Results for orders placed or performed during the hospital encounter of 03/09/20 (from the past 24 hour(s))  CBC with Differential/Platelet     Status: Abnormal   Collection Time: 03/11/20  8:16 AM  Result Value Ref Range   WBC 11.5 (H) 4.0 - 10.5 K/uL   RBC 2.95 (L) 3.87 - 5.11 MIL/uL   Hemoglobin 8.4 (L) 12.0 - 15.0 g/dL   HCT 03/13/20 (L) 36 - 46 %   MCV 89.2 80.0 - 100.0 fL   MCH 28.5 26.0 - 34.0 pg   MCHC 31.9 30.0 - 36.0 g/dL   RDW 00.9 38.1 - 82.9 %   Platelets 204 150 - 400 K/uL   nRBC 0.0 0.0 - 0.2 %   Neutrophils Relative % 83 %   Neutro Abs 9.5 (H) 1.7 - 7.7 K/uL   Lymphocytes Relative 9 %   Lymphs Abs 1.1 0.7 - 4.0 K/uL   Monocytes Relative 7 %   Monocytes Absolute 0.8 0 - 1 K/uL   Eosinophils Relative 0 %   Eosinophils Absolute 0.0 0 - 0 K/uL   Basophils Relative 0 %   Basophils Absolute 0.0 0 - 0 K/uL   Immature Granulocytes 1 %   Abs Immature Granulocytes 0.09 (H) 0.00 - 0.07 K/uL  Comprehensive metabolic panel     Status: Abnormal   Collection Time: 03/11/20  8:16 AM  Result Value Ref Range   Sodium 136 135 - 145 mmol/L   Potassium 3.6 3.5 - 5.1 mmol/L   Chloride 99 98 - 111 mmol/L   CO2 28 22 - 32 mmol/L    Glucose, Bld 111 (H) 70 - 99 mg/dL   BUN 24 (H) 8 - 23 mg/dL   Creatinine, Ser 03/13/20 0.44 - 1.00 mg/dL   Calcium 8.3 (L) 8.9 - 10.3 mg/dL   Total Protein 6.3 (L) 6.5 - 8.1 g/dL   Albumin 2.6 (L) 3.5 - 5.0 g/dL   AST 21 15 - 41 U/L   ALT 12 0 - 44 U/L   Alkaline Phosphatase 53 38 - 126 U/L   Total Bilirubin 0.7 0.3 - 1.2 mg/dL   GFR calc non Af Amer 59 (L) >60 mL/min   GFR calc Af Amer >60 >60 mL/min   Anion gap 9 5 - 15  Magnesium     Status: None   Collection Time: 03/11/20  8:16 AM  Result Value Ref Range   Magnesium 1.8 1.7 - 2.4 mg/dL  Phosphorus     Status: None   Collection Time: 03/11/20  8:16 AM  Result Value Ref Range   Phosphorus 3.0 2.5 -  4.6 mg/dL  Folate     Status: None   Collection Time: 03/11/20  8:16 AM  Result Value Ref Range   Folate 8.2 >5.9 ng/mL  Iron and TIBC     Status: Abnormal   Collection Time: 03/11/20  8:16 AM  Result Value Ref Range   Iron 32 28 - 170 ug/dL   TIBC 456 (L) 256 - 389 ug/dL   Saturation Ratios 18 10.4 - 31.8 %   UIBC 150 ug/dL  Ferritin     Status: None   Collection Time: 03/11/20  8:16 AM  Result Value Ref Range   Ferritin 194 11 - 307 ng/mL  Reticulocytes     Status: Abnormal   Collection Time: 03/11/20  8:16 AM  Result Value Ref Range   Retic Ct Pct 2.9 0.4 - 3.1 %   RBC. 3.02 (L) 3.87 - 5.11 MIL/uL   Retic Count, Absolute 88.8 19.0 - 186.0 K/uL   Immature Retic Fract 32.8 (H) 2.3 - 15.9 %    DG Chest 1 View  Result Date: 03/09/2020 CLINICAL DATA:  Pain EXAM: CHEST  1 VIEW COMPARISON:  None. FINDINGS: There is a significant S shaped curvature of the thoracolumbar spine. The heart size appears mildly enlarged. The patient is status post prior median sternotomy. There is no pneumothorax or large pleural effusion. There is no acute osseous abnormality. IMPRESSION: No active disease. Electronically Signed   By: Katherine Mantle M.D.   On: 03/09/2020 15:15   CT HEAD WO CONTRAST  Result Date: 03/09/2020 CLINICAL DATA:  Larey Seat  today, hip fracture EXAM: CT HEAD WITHOUT CONTRAST TECHNIQUE: Contiguous axial images were obtained from the base of the skull through the vertex without intravenous contrast. COMPARISON:  None. FINDINGS: Brain: Scattered hypodensities are seen throughout the periventricular white matter consistent with age-indeterminate small vessel ischemic changes, likely chronic. No other signs of acute infarct or hemorrhage. Lateral ventricles and midline structures are unremarkable. No acute extra-axial fluid collections. No mass effect. Vascular: No hyperdense vessel or unexpected calcification. Skull: Normal. Negative for fracture or focal lesion. Sinuses/Orbits: No acute finding. Other: None. IMPRESSION: 1. Age-indeterminate small-vessel ischemic changes in the white matter, favor chronic. 2. No acute intracranial trauma. Electronically Signed   By: Sharlet Salina M.D.   On: 03/09/2020 17:47   DG HIP OPERATIVE UNILAT W OR W/O PELVIS RIGHT  Result Date: 03/10/2020 CLINICAL DATA:  Right hip fracture EXAM: OPERATIVE right HIP (WITH PELVIS IF PERFORMED) 16 VIEWS TECHNIQUE: Fluoroscopic spot image(s) were submitted for interpretation post-operatively. COMPARISON:  03/10/2020 FINDINGS: C-arm images demonstrate fixation of right intertrochanteric fracture with compression screw and locking intramedullary rod extending into the distal femur. Fracture alignment satisfactory IMPRESSION: Right intertrochanteric fracture ORIF. Electronically Signed   By: Marlan Palau M.D.   On: 03/10/2020 16:44   DG Hip Unilat W or Wo Pelvis 2-3 Views Right  Result Date: 03/09/2020 CLINICAL DATA:  Pain EXAM: DG HIP (WITH OR WITHOUT PELVIS) 2-3V RIGHT COMPARISON:  None. FINDINGS: There is an acute displaced intratrochanteric fracture of the proximal right femur. There is no dislocation. There is osteopenia. 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 Vascular calcifications are noted. IMPRESSION: Acute displaced intratrochanteric fracture of  the proximal right femur. Electronically Signed   By: Katherine Mantle M.D.   On: 03/09/2020 15:14   DG FEMUR, MIN 2 VIEWS RIGHT  Result Date: 03/10/2020 CLINICAL DATA:  Postop intramedullary nail for fracture EXAM: RIGHT FEMUR 2 VIEWS COMPARISON:  03/09/2020 FINDINGS: Intertrochanteric fracture has been fixed  with a screw and locking intramedullary rod. Hardware in good position. Fracture alignment satisfactory. IMPRESSION: ORIF intertrochanteric fracture on the right. Electronically Signed   By: Marlan Palau M.D.   On: 03/10/2020 16:19    Assessment/Plan: 1 Day Post-Op   Active Problems:   Generalized anxiety disorder   HLD (hyperlipidemia)   Fracture   Essential hypertension   Hypokalemia  Patient will begin physical therapy when she is less confused.  She is weightbearing as tolerated in the right lower extremity.  Patient will begin Lovenox for DVT prophylaxis today.  Hemoglobin and hematocrit remain at acceptable levels.  No transfusion is needed.  Blood glucose is 111.  Patient's daughter has requested the J&J single injection Covid vaccine for her mother.  Patient's daughter wants her mother to go to [facility] skilled nursing facility upon discharge.    Juanell Fairly , MD 03/11/2020, 11:05 AM

## 2020-03-11 NOTE — Progress Notes (Signed)
OT Cancellation Note  Patient Details Name: CELESTIAL BARNFIELD MRN: [id number] DOB: [birth year]   Cancelled Treatment:    Reason Eval/Treat Not Completed: Medical issues which prohibited therapy. Pt currently on delirium precautions and very lethargic. OT did speak with pt's daughter, present in the room, who requested therapies attempt evaluation tomorrow. OT will follow up as able/appropriate.   Jackquline Denmark, MS, OTR/L , CBIS ascom [phone]  03/11/20, 2:24 PM   03/11/2020, 2:22 PM

## 2020-03-11 NOTE — Progress Notes (Addendum)
PT Cancellation Note  Patient Details Name: Michelle Bauer MRN: [id number] DOB: Sep 05, 1940   Cancelled Treatment:    Reason Eval/Treat Not Completed: Medical issues which prohibited therapy.  Per OT, daughter requests therapy services to be re-attempted at later date due to delirium.  PT will attempt evaluation at a later date as medically appropriate.     Nolon Bussing, PT, DPT 03/11/20, 2:46 PM

## 2020-03-11 NOTE — Progress Notes (Signed)
PROGRESS NOTE    Michelle Bauer  HYQ:[id number] DOB: Jan 09, 1941 DOA: 03/09/2020 PCP: [name], MD   Brief Narrative:  HPI per Dr. [name] on 03/09/20 Michelle Bauer is a 79 y.o. female with medical history significant of 79 year old female with past medical history of hypertension, hyperlipidemia, anxiety/depression, severe scoliosis, diverticular bleed presented status post mechanical fall which occurred yesterday.  Patient was on her usual health and her daughter was coming to visit her.  She went to unlock to double lock since her daughter did not have the key to the door and she reports a chair was pulled out in the kitchen and somehow she stumbled over it and fell.  She did hit her head against a wall.  Daughter arrived 30 minutes later meanwhile patient was on the floor.  This occurred yesterday and patient refused to come to the ER.  Daughter realized patient is unable to walk with a call patient's PCP and PCP recommended them to call EMS. She reports pain of rt hip as she points to it. Denies any other pain.  She denies headache, vision change, chest pain or shortness of breath.  Daughter thinks she is starting to have some dementia signs but has not been evaluated.  Patient reports chronic diarrhea however now is more formed and soft with Metamucil as she takes it every day.  Patient is Covid negative.  However she has not had her vaccination yet.  Daughters have been vaccinated.  ED Course: Blood pressure 137/78 afebrile, on room air 97%  Review of Systems: All systems reviewed and otherwise negative.  Potassium 3.3, creatinine 1.05, glucose 135 BUN 24 WBC 8.7 hemoglobin 9.4 hematocrit 29.6 MCV 87.6  Hip x-ray revealed acute displaced intertrochanteric fracture of the proximal right femur  Chest x-ray personally reviewed no acute cardiopulmonary disease EKG personally reviewed sinus rhythm no ischemic ST changes  **Interim History Patient is to underwent  operative procedure 03/10/20 for the patient's hip fracture  Patient was complaining of nausea night before last and overnight she became extremely agitated and confused IV in the setting of her anesthesia.  She remains significantly confused at this time but is not as agitated and are soft mitten restraints have been removed.  Assessment & Plan:   Active Problems:   Generalized anxiety disorder   HLD (hyperlipidemia)   Fracture   Essential hypertension   Hypokalemia  Acute Right Proximal Femur Fracture in the setting of Mechanical Fall status post intramedullary fixation of the right intertrochanteric hip fracture postoperative day 1 -Admitted to inpatient MedSurg  -Hip X-Ray showed "Acute displaced intratrochanteric fracture of the proximal right femur." -ED spoke to Dr. [name] of Orthopedics and plan is for Surgical intervention today (scheduled for 1 pm) -Continue with pain management and was given half a milligram of Dilaudid once in the ED.  Continue with hydrocodone-acetaminophen 1-2 tabs p.o. every 6 hours as needed moderate pain, IV morphine 0.5 mg every 2 as needed severe pain -Continue with supportive care and antiemetics with IV Zofran 4 mg every 6 hours as needed nausea or vomiting -She was n.p.o. after midnight for the procedure and is now on a regular diet -Bowel regimen initiated and she is on psyllium 1 packet p.o. daily, senna docusate 1 tab p.o. nightly as needed for mild constipation, as well as bisacodyl 5 mg p.o. daily as needed for moderate constipation adjust as necessary -Continue IV fluid hydration with lactated Ringer's at 75 mils per hour for now and discontinue  once she is out of surgery and eating -Head CT obtained since she did fall and showed age indeterminate small vessel ischemic changes in the white matter which was favored to be chronic and no acute intra cranial trauma noted. -Deferring pharmacological VTE prophylaxis to orthopedic surgery and she will be  started on Lovenox today; currently on SCDs and will -PT OT to still evaluate and treat but Dr. [name] recommending it when she is less confused and recommends weightbearing as tolerated in the right lower extremity  Acute Delirium -Postoperatively and likely secondary to anesthesia -Became extremely agitated last night and pulled out both her IVs and had to be placed in soft mitten restraints -Reorient frequently and will place on delirium precautions -If not improving will consider head MRI given that she just recently had a head CT scan and will do further work-up with a TSH, RPR, vitamin B12 level  Essential Hypertension -On hydrochlorothiazide PTA only which will be held -Blood pressure currently stable and since she is going to surgery will hold off and place her on IV -hydralazine as needed for systolic blood pressure more than 160 -Hold off on HCTZ  Also because of hypokalemia -Last BP reading was 158/63  Hyperlipidemia -LFTs normal on admission with an AST of 28 and ALT of 12; now AST is 21, ALT is 12 -Continue home Atorvastatin 40 mg po qHS and Omega-3 Acid Ethyl Esters 1 gram po Daily with Lunch  Leukocytosis -Mild likely reactive -Patient WBC is now worsened and gone to 11.5 from 9.9; if continues to worsen we will panculture -Likely in the setting of her surgery -Continue to monitor for signs and symptoms of infection -Repeat CBC in a.m.  Hypokalemia  -K+ on Admission was 3.3 and after repletion today is 3.6 -Continue to Monitor and Replete as Necessary -Repeat BMP in the AM   Depression/Anxiety -C/w Escitalopram 5 mg po BID -Pharmacy has verified the patient takes clonazepam 0.5 mg 4 times daily and this will be resumed  Normocytic Anemia -Patient's Hgb/Hct went from 9.4/29.6 -> 10.1/30.1 and today dropped to 8.4/26.3 after surgery -Anemia panel was checked and showed an iron level of 32, U IBC 150, TIBC 182, saturation ratios of 18%, ferritin level 194,  folate level of 8.2 -Continue to Monitor for S/Sx of Bleeding; Currently no overt bleeding noted -As expected she did have a drop some post-operatively -Repeat CBC in the AM  Hyperglycemia  -Paitent's Blood Sugar on Admission was 135 and repeat was 125 -Check HbA1c is still pending -Continue to Monitor Blood Sugars carefully and if necessary will place on Sensitive Novolog SSI  DVT prophylaxis: SCDs Code Status: DO NOT RESUSCITATE  Family Communication: Spoke to daughter at bedside Disposition Plan: Pending Ortho clearance as well as evaluation by PT and OT  Status is: Inpatient  Remains inpatient appropriate because:Unsafe d/c plan, IV treatments appropriate due to intensity of illness or inability to take PO and Inpatient level of care appropriate due to severity of illness   Dispo: The patient is from: Home              Anticipated d/c is to: SNF              Anticipated d/c date is: 2 days              Patient currently is not medically stable to d/c.  Consultants:   Orthopedic Surgery Dr. Juanell Fairly   Procedures: INTRAMEDULLARY FIXATION OF RIGHT INTERTROCHANTERIC HIP FRACTURE POD 1  by Dr. Rico Ala  Antimicrobials:  Anti-infectives (From admission, onward)   Start     Dose/Rate Route Frequency Ordered Stop   03/10/20 2000  ceFAZolin (ANCEF) IVPB 2g/100 mL premix        2 g 200 mL/hr over 30 Minutes Intravenous Every 6 hours 03/10/20 1657 03/11/20 0641   03/10/20 1431  gentamicin 80 mg in 0.9% sodium chloride 250 mL irrigation  Status:  Discontinued          As needed 03/10/20 1431 03/10/20 1502   03/10/20 1300  ceFAZolin (ANCEF) IVPB 2g/100 mL premix        2 g 200 mL/hr over 30 Minutes Intravenous 30 min pre-op 03/10/20 1220 03/10/20 1310   03/10/20 1250  ceFAZolin (ANCEF) 2-4 GM/100ML-% IVPB       Note to Pharmacy: Agnes Lawrence  : cabinet override      03/10/20 1250 03/10/20 1332        Subjective: Seen and examined at bedside and she was extremely  confused and nursing states that she is combative with them and pulled out both her IVs.  Nursing states that the patient will try to kick, and spit at the staff last night.  Daughter has calmed the patient down the patient wanted to be left alone this morning want to go see her.  Remains confused and when asked about pain but daughter does not feel the patient is in pain and patient just states "get away from me".  No other concerns or complaints at this time.  Objective: Vitals:   03/10/20 2120 03/10/20 2221 03/11/20 0006 03/11/20 0805  BP: (!) 166/79 (!) 177/90 (!) 182/80 (!) 158/63  Pulse: 78 77 94 91  Resp: 17 15 16 18   Temp: 99.5 F (37.5 C) 99 F (37.2 C) 98.5 F (36.9 C) 98.4 F (36.9 C)  TempSrc:    Oral  SpO2: 100% 100% 98% 94%  Weight:      Height:        Intake/Output Summary (Last 24 hours) at 03/11/2020 1334 Last data filed at 03/11/2020 03/13/2020 Gross per 24 hour  Intake 1314.56 ml  Output 700 ml  Net 614.56 ml   Filed Weights   03/09/20 1428  Weight: 56 kg   Examination: Physical Exam:  Constitutional: The patient is a thin elderly Caucasian female currently in no acute distress.  Remains significantly confused and was agitated earlier Eyes: Lids and conjunctivae normal, sclerae anicteric  ENMT: External Ears, Nose appear normal. Grossly normal hearing.  Neck: Appears normal, supple, no cervical masses, normal ROM, no appreciable thyromegaly neck: No JVD Respiratory: Diminished to auscultation bilaterally, no wheezing, rales, rhonchi or crackles. Normal respiratory effort and patient is not tachypenic. No accessory muscle use.  Unlabored breathing Cardiovascular: RRR, no murmurs / rubs / gallops. S1 and S2 auscultated.  Abdomen: Soft, non-tender, non-distended. Bowel sounds positive.  GU: Deferred. Musculoskeletal: No clubbing / cyanosis of digits/nails. No joint deformity upper and lower extremities.  Skin: No rashes, lesions, ulcers on limited skin evaluation. No  induration; Warm and dry.  Neurologic: CN 2-12 grossly intact with no focal deficits. Romberg sign and cerebellar reflexes not assessed.  Psychiatric: Impaired judgment and insight.  She is not alert and oriented x 3.  Slightly agitated mood and appropriate affect.   Data Reviewed: I have personally reviewed following labs and imaging studies  CBC: Recent Labs  Lab 03/09/20 1440 03/10/20 0412 03/11/20 0816  WBC 8.7 9.9 11.5*  NEUTROABS 6.8  --  9.5*  HGB 9.4* 10.1* 8.4*  HCT 29.6* 30.1* 26.3*  MCV 87.6 85.3 89.2  PLT 225 223 204   Basic Metabolic Panel: Recent Labs  Lab 03/09/20 1440 03/10/20 0412 03/11/20 0816  NA 137 135 136  K 3.3* 3.7 3.6  CL 96* 99 99  CO2 27 27 28   GLUCOSE 135* 125* 111*  BUN 24* 23 24*  CREATININE 1.05* 0.92 0.93  CALCIUM 8.6* 8.6* 8.3*  MG  --   --  1.8  PHOS  --   --  3.0   GFR: Estimated Creatinine Clearance: 44.8 mL/min (by C-G formula based on SCr of 0.93 mg/dL). Liver Function Tests: Recent Labs  Lab 03/09/20 1440 03/11/20 0816  AST 28 21  ALT 12 12  ALKPHOS 72 53  BILITOT 0.6 0.7  PROT 7.2 6.3*  ALBUMIN 2.9* 2.6*   No results for input(s): LIPASE, AMYLASE in the last 168 hours. No results for input(s): AMMONIA in the last 168 hours. Coagulation Profile: Recent Labs  Lab 03/09/20 1823  INR 1.1   Cardiac Enzymes: No results for input(s): CKTOTAL, CKMB, CKMBINDEX, TROPONINI in the last 168 hours. BNP (last 3 results) No results for input(s): PROBNP in the last 8760 hours. HbA1C: No results for input(s): HGBA1C in the last 72 hours. CBG: No results for input(s): GLUCAP in the last 168 hours. Lipid Profile: No results for input(s): CHOL, HDL, LDLCALC, TRIG, CHOLHDL, LDLDIRECT in the last 72 hours. Thyroid Function Tests: No results for input(s): TSH, T4TOTAL, FREET4, T3FREE, THYROIDAB in the last 72 hours. Anemia Panel: Recent Labs    03/11/20 0816  FOLATE 8.2  FERRITIN 194  TIBC 182*  IRON 32  RETICCTPCT 2.9    Sepsis Labs: No results for input(s): PROCALCITON, LATICACIDVEN in the last 168 hours.  Recent Results (from the past 240 hour(s))  SARS Coronavirus 2 by RT PCR (hospital order, performed in [facility]) Nasopharyngeal Nasopharyngeal Swab     Status: None   Collection Time: 03/09/20  2:31 PM   Specimen: Nasopharyngeal Swab  Result Value Ref Range Status   SARS Coronavirus 2 NEGATIVE NEGATIVE Final    Comment: (NOTE) SARS-CoV-2 target nucleic acids are NOT DETECTED.  The SARS-CoV-2 RNA is generally detectable in upper and lower respiratory specimens during the acute phase of infection. The lowest concentration of SARS-CoV-2 viral copies this assay can detect is 250 copies / mL. A negative result does not preclude SARS-CoV-2 infection and should not be used as the sole basis for treatment or other patient management decisions.  A negative result may occur with improper specimen collection / handling, submission of specimen other than nasopharyngeal swab, presence of viral mutation(s) within the areas targeted by this assay, and inadequate number of viral copies (<250 copies / mL). A negative result must be combined with clinical observations, patient history, and epidemiological information.  Fact Sheet for Patients:   BoilerBrush.com.cyhttps://www.fda.gov/media/136312/download  Fact Sheet for Healthcare Providers: https://pope.com/https://www.fda.gov/media/136313/download  This test is not yet approved or  cleared by the [place] FDA and has been authorized for detection and/or diagnosis of SARS-CoV-2 by FDA under an Emergency Use Authorization (EUA).  This EUA will remain in effect (meaning this test can be used) for the duration of the COVID-19 declaration under Section 564(b)(1) of the Act, 21 U.S.C. section 360bbb-3(b)(1), unless the authorization is terminated or revoked sooner.  Performed at [facility], [address] [place], [place] [address]   Surgical PCR screen  Status: Abnormal   Collection Time: 03/09/20  9:25 PM   Specimen: Nasal Mucosa; Nasal Swab  Result Value Ref Range Status   MRSA, PCR NEGATIVE NEGATIVE Final   Staphylococcus aureus POSITIVE (A) NEGATIVE Final    Comment: (NOTE) The Xpert SA Assay (FDA approved for NASAL specimens in patients 67 years of age and older), is one component of a comprehensive surveillance program. It is not intended to diagnose infection nor to guide or monitor treatment. Performed at [facility], [address] [place], [place] [address]     RN Pressure Injury Documentation:     Estimated body mass index is 20.54 kg/m as calculated from the following:   Height as of this encounter: 5\' 5"  (1.651 m).   Weight as of this encounter: 56 kg.  Malnutrition Type:      Malnutrition Characteristics:      Nutrition Interventions:    Radiology Studies: DG Chest 1 View  Result Date: 03/09/2020 CLINICAL DATA:  Pain EXAM: CHEST  1 VIEW COMPARISON:  None. FINDINGS: There is a significant S shaped curvature of the thoracolumbar spine. The heart size appears mildly enlarged. The patient is status post prior median sternotomy. There is no pneumothorax or large pleural effusion. There is no acute osseous abnormality. IMPRESSION: No active disease. Electronically Signed   By: 03/11/2020 M.D.   On: 03/09/2020 15:15   CT HEAD WO CONTRAST  Result Date: 03/09/2020 CLINICAL DATA:  03/11/2020 today, hip fracture EXAM: CT HEAD WITHOUT CONTRAST TECHNIQUE: Contiguous axial images were obtained from the base of the skull through the vertex without intravenous contrast. COMPARISON:  None. FINDINGS: Brain: Scattered hypodensities are seen throughout the periventricular white matter consistent with age-indeterminate small vessel ischemic changes, likely chronic. No other signs of acute infarct or hemorrhage. Lateral ventricles and midline structures are unremarkable. No acute extra-axial fluid collections. No  mass effect. Vascular: No hyperdense vessel or unexpected calcification. Skull: Normal. Negative for fracture or focal lesion. Sinuses/Orbits: No acute finding. Other: None. IMPRESSION: 1. Age-indeterminate small-vessel ischemic changes in the white matter, favor chronic. 2. No acute intracranial trauma. Electronically Signed   By: Larey Seat M.D.   On: 03/09/2020 17:47   DG HIP OPERATIVE UNILAT W OR W/O PELVIS RIGHT  Result Date: 03/10/2020 CLINICAL DATA:  Right hip fracture EXAM: OPERATIVE right HIP (WITH PELVIS IF PERFORMED) 16 VIEWS TECHNIQUE: Fluoroscopic spot image(s) were submitted for interpretation post-operatively. COMPARISON:  03/10/2020 FINDINGS: C-arm images demonstrate fixation of right intertrochanteric fracture with compression screw and locking intramedullary rod extending into the distal femur. Fracture alignment satisfactory IMPRESSION: Right intertrochanteric fracture ORIF. Electronically Signed   By: 03/12/2020 M.D.   On: 03/10/2020 16:44   DG Hip Unilat W or Wo Pelvis 2-3 Views Right  Result Date: 03/09/2020 CLINICAL DATA:  Pain EXAM: DG HIP (WITH OR WITHOUT PELVIS) 2-3V RIGHT COMPARISON:  None. FINDINGS: There is an acute displaced intratrochanteric fracture of the proximal right femur. There is no dislocation. There is osteopenia. 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 Vascular calcifications are noted. IMPRESSION: Acute displaced intratrochanteric fracture of the proximal right femur. Electronically Signed   By: 03/11/2020 M.D.   On: 03/09/2020 15:14   DG FEMUR, MIN 2 VIEWS RIGHT  Result Date: 03/10/2020 CLINICAL DATA:  Postop intramedullary nail for fracture EXAM: RIGHT FEMUR 2 VIEWS COMPARISON:  03/09/2020 FINDINGS: Intertrochanteric fracture has been fixed with a screw and locking intramedullary rod. Hardware in good position. Fracture alignment satisfactory. IMPRESSION: ORIF intertrochanteric fracture  on the right. Electronically Signed   By: Marlan Palau M.D.   On: 03/10/2020 16:19   Scheduled Meds: . acetaminophen  500 mg Oral Q6H  . atorvastatin  40 mg Oral Q supper  . Chlorhexidine Gluconate Cloth  6 each Topical Q0600  . clonazePAM  0.5 mg Oral QID  . docusate sodium  100 mg Oral BID  . enoxaparin (LOVENOX) injection  40 mg Subcutaneous Q24H  . escitalopram  5 mg Oral Daily  . ferrous sulfate  325 mg Oral TID PC  . haloperidol lactate      . multivitamin with minerals  1 tablet Oral Daily  . mupirocin ointment  1 application Nasal BID  . omega-3 acid ethyl esters  1 g Oral Q lunch  . potassium chloride  40 mEq Oral Once  . psyllium  1 packet Oral Daily  . senna  1 tablet Oral BID  . traMADol  100 mg Oral TID   Continuous Infusions: . sodium chloride 75 mL/hr (03/11/20 1008)  . lactated ringers 75 mL/hr at 03/10/20 1255  . methocarbamol (ROBAXIN) IV      LOS: 2 days   Merlene Laughter, DO [facility] PAGER is on AMION  If 7PM-7AM, please contact night-coverage [email or web address]

## 2020-03-11 NOTE — Progress Notes (Signed)
Ch visited with Pt in response to page about request for prayer. Pt's daughter Michelle Bauer was tearful at bedside. Michelle Bauer says that her mom (Pt) was there for surgery but had become confused last night and started scratching herself. She says that Pt is a strong Christian, and would like to have some prayer if it would help her. Ch prayed with them. Pt closed her eyes for prayer. Ch let Pt to rest after prayer, and let Michelle Bauer know about chaplain availability anytime before leaving.

## 2020-03-12 ENCOUNTER — Inpatient Hospital Stay: Payer: Medicare Other

## 2020-03-12 DIAGNOSIS — I4891 Unspecified atrial fibrillation: Secondary | ICD-10-CM

## 2020-03-12 DIAGNOSIS — T148XXA Other injury of unspecified body region, initial encounter: Secondary | ICD-10-CM

## 2020-03-12 DIAGNOSIS — S72001A Fracture of unspecified part of neck of right femur, initial encounter for closed fracture: Secondary | ICD-10-CM

## 2020-03-12 DIAGNOSIS — D6869 Other thrombophilia: Secondary | ICD-10-CM

## 2020-03-12 DIAGNOSIS — W19XXXD Unspecified fall, subsequent encounter: Secondary | ICD-10-CM

## 2020-03-12 DIAGNOSIS — J810 Acute pulmonary edema: Secondary | ICD-10-CM

## 2020-03-12 DIAGNOSIS — E876 Hypokalemia: Secondary | ICD-10-CM

## 2020-03-12 LAB — COMPREHENSIVE METABOLIC PANEL
ALT: 8 U/L (ref 0–44)
AST: 19 U/L (ref 15–41)
Albumin: 2.2 g/dL — ABNORMAL LOW (ref 3.5–5.0)
Alkaline Phosphatase: 47 U/L (ref 38–126)
Anion gap: 8 (ref 5–15)
BUN: 20 mg/dL (ref 8–23)
CO2: 26 mmol/L (ref 22–32)
Calcium: 7.9 mg/dL — ABNORMAL LOW (ref 8.9–10.3)
Chloride: 102 mmol/L (ref 98–111)
Creatinine, Ser: 0.62 mg/dL (ref 0.44–1.00)
GFR calc Af Amer: >60 mL/min (ref >60)
GFR calc non Af Amer: >60 mL/min (ref >60)
Glucose, Bld: 82 mg/dL (ref 70–99)
Potassium: 3.2 mmol/L — ABNORMAL LOW (ref 3.5–5.1)
Sodium: 136 mmol/L (ref 135–145)
Total Bilirubin: 0.7 mg/dL (ref 0.3–1.2)
Total Protein: 5.5 g/dL — ABNORMAL LOW (ref 6.5–8.1)

## 2020-03-12 LAB — CBC WITH DIFFERENTIAL/PLATELET
Abs Immature Granulocytes: 0.05 10*3/uL (ref 0.00–0.07)
Basophils Absolute: 0 10*3/uL (ref 0.0–0.1)
Basophils Relative: 0 %
Eosinophils Absolute: 0 10*3/uL (ref 0.0–0.5)
Eosinophils Relative: 0 %
HCT: 22.7 % — ABNORMAL LOW (ref 36.0–46.0)
Hemoglobin: 7.2 g/dL — ABNORMAL LOW (ref 12.0–15.0)
Immature Granulocytes: 1 %
Lymphocytes Relative: 15 %
Lymphs Abs: 1.1 10*3/uL (ref 0.7–4.0)
MCH: 27.6 pg (ref 26.0–34.0)
MCHC: 31.7 g/dL (ref 30.0–36.0)
MCV: 87 fL (ref 80.0–100.0)
Monocytes Absolute: 0.5 10*3/uL (ref 0.1–1.0)
Monocytes Relative: 7 %
Neutro Abs: 5.7 10*3/uL (ref 1.7–7.7)
Neutrophils Relative %: 77 %
Platelets: 198 10*3/uL (ref 150–400)
RBC: 2.61 MIL/uL — ABNORMAL LOW (ref 3.87–5.11)
RDW: 13.8 % (ref 11.5–15.5)
WBC: 7.4 10*3/uL (ref 4.0–10.5)
nRBC: 0 % (ref 0.0–0.2)

## 2020-03-12 LAB — PROTIME-INR
INR: 1.2 (ref 0.8–1.2)
Prothrombin Time: 14.6 seconds (ref 11.4–15.2)

## 2020-03-12 LAB — APTT: aPTT: 37 seconds — ABNORMAL HIGH (ref 24–36)

## 2020-03-12 LAB — PHOSPHORUS: Phosphorus: 2.9 mg/dL (ref 2.5–4.6)

## 2020-03-12 LAB — MAGNESIUM: Magnesium: 2.1 mg/dL (ref 1.7–2.4)

## 2020-03-12 LAB — TROPONIN I (HIGH SENSITIVITY): Troponin I (High Sensitivity): 1523 ng/L (ref <18)

## 2020-03-12 LAB — PREPARE RBC (CROSSMATCH)

## 2020-03-12 MED ORDER — DILTIAZEM LOAD VIA INFUSION
10.0000 mg | Freq: Once | INTRAVENOUS | Status: AC
  Administered 2020-03-12: 10 mg via INTRAVENOUS
  Filled 2020-03-12: qty 10

## 2020-03-12 MED ORDER — POTASSIUM CHLORIDE CRYS ER 20 MEQ PO TBCR
40.0000 meq | EXTENDED_RELEASE_TABLET | Freq: Two times a day (BID) | ORAL | Status: AC
  Administered 2020-03-12 (×2): 40 meq via ORAL
  Filled 2020-03-12 (×2): qty 2

## 2020-03-12 MED ORDER — SODIUM CHLORIDE 0.9% FLUSH
3.0000 mL | Freq: Two times a day (BID) | INTRAVENOUS | Status: DC
  Administered 2020-03-12 – 2020-03-17 (×9): 3 mL via INTRAVENOUS

## 2020-03-12 MED ORDER — CYANOCOBALAMIN 1000 MCG/ML IJ SOLN
1000.0000 ug | Freq: Once | INTRAMUSCULAR | Status: AC
  Administered 2020-03-12: 1000 ug via INTRAMUSCULAR
  Filled 2020-03-12: qty 1

## 2020-03-12 MED ORDER — DIGOXIN 0.25 MG/ML IJ SOLN
0.2500 mg | Freq: Once | INTRAMUSCULAR | Status: AC
  Administered 2020-03-12: 0.25 mg via INTRAVENOUS
  Filled 2020-03-12: qty 2

## 2020-03-12 MED ORDER — SODIUM CHLORIDE 0.9% IV SOLUTION: Freq: Once | INTRAVENOUS | Status: AC

## 2020-03-12 MED ORDER — AMIODARONE HCL IN DEXTROSE 360-4.14 MG/200ML-% IV SOLN
60.0000 mg/h | INTRAVENOUS | Status: DC
  Administered 2020-03-12 – 2020-03-13 (×2): 60 mg/h via INTRAVENOUS
  Filled 2020-03-12: qty 200

## 2020-03-12 MED ORDER — POTASSIUM CHLORIDE 10 MEQ/100ML IV SOLN
10.0000 meq | INTRAVENOUS | Status: DC
  Filled 2020-03-12: qty 100

## 2020-03-12 MED ORDER — AMIODARONE HCL IN DEXTROSE 360-4.14 MG/200ML-% IV SOLN
30.0000 mg/h | INTRAVENOUS | Status: DC
  Administered 2020-03-13: 30 mg/h via INTRAVENOUS
  Filled 2020-03-12 (×2): qty 200

## 2020-03-12 MED ORDER — FUROSEMIDE 10 MG/ML IJ SOLN
20.0000 mg | Freq: Once | INTRAMUSCULAR | Status: AC
  Administered 2020-03-12: 20 mg via INTRAVENOUS
  Filled 2020-03-12: qty 2

## 2020-03-12 MED ORDER — HEPARIN (PORCINE) 25000 UT/250ML-% IV SOLN
1100.0000 [IU]/h | INTRAVENOUS | Status: AC
  Administered 2020-03-12: 750 [IU]/h via INTRAVENOUS
  Administered 2020-03-13: 1100 [IU]/h via INTRAVENOUS
  Filled 2020-03-12 (×2): qty 250

## 2020-03-12 MED ORDER — AMIODARONE LOAD VIA INFUSION
150.0000 mg | Freq: Once | INTRAVENOUS | Status: AC
  Administered 2020-03-12: 150 mg via INTRAVENOUS
  Filled 2020-03-12: qty 83.34

## 2020-03-12 MED ORDER — DILTIAZEM HCL-DEXTROSE 125-5 MG/125ML-% IV SOLN (PREMIX)
5.0000 mg/h | INTRAVENOUS | Status: DC
  Administered 2020-03-12: 5 mg/h via INTRAVENOUS
  Administered 2020-03-12: 10 mg/h via INTRAVENOUS
  Filled 2020-03-12 (×2): qty 125

## 2020-03-12 NOTE — Anesthesia Postprocedure Evaluation (Signed)
Anesthesia Post Note  Patient: Michelle Bauer  Procedure(s) Performed: INTRAMEDULLARY (IM) NAIL INTERTROCHANTRIC (Right Hip)  Patient location during evaluation: PACU Anesthesia Type: General Level of consciousness: awake and alert Pain management: pain level controlled Vital Signs Assessment: post-procedure vital signs reviewed and stable Respiratory status: spontaneous breathing, nonlabored ventilation, respiratory function stable and patient connected to nasal cannula oxygen Cardiovascular status: blood pressure returned to baseline and stable Postop Assessment: no apparent nausea or vomiting Anesthetic complications: no   No complications documented.   Last Vitals:  Vitals:   03/11/20 2331 03/12/20 0744  BP: (!) 150/76 (!) 149/69  Pulse: 80 78  Resp: 16 18  Temp: 36.8 C 36.9 C  SpO2: 95% 96%    Last Pain:  Vitals:   03/12/20 0744  TempSrc: Oral  PainSc:                  [name]

## 2020-03-12 NOTE — Progress Notes (Signed)
Physical Therapy Treatment Patient Details Name: Michelle Bauer MRN: [id number] DOB: 1941-03-22 Today's Date: 03/12/2020    History of Present Illness Pt is a 79 y.o. female with medical history significant of 79 year old female with past medical history of hypertension, hyperlipidemia, anxiety/depression, severe scoliosis, diverticular bleed presented status post fall.  Patient was on her usual health and her daughter was coming to visit her.  She went to unlock to double lock since her daughter did not have the key to the door and she reports a chair was pulled out in the kitchen and somehow she stumbled over it and fell.  She did hit her head against a wall.  Daughter arrived 30 minutes later meanwhile patient was on the floor.  Pt initially refused to come to the ER, but could not walk.  Found to have R hip fx and is now s/p ORIF 9/22.    PT Comments    Pt struggled with mobility and really any activity this afternoon.  Per daughter, she had anxiety attack this afternoon and has essentially "shut down" since then.  Pt did struggle to follow instructions, stay on tasks or show much real effort with mobility and though she was able to do some exercises after standing, "gait" mobility tasks she did not have the strength, quality of motion or consistency with exercise reps.  Pt continues to report minimal to no pain at rest and apart from little outbursts seemingly from expected pain this does not seem to be a significant limiter.     Follow Up Recommendations  SNF     Equipment Recommendations   (TBD at rehab)    Recommendations for Other Services       Precautions / Restrictions Precautions Precautions: Fall Restrictions Weight Bearing Restrictions: Yes RLE Weight Bearing: Weight bearing as tolerated    Mobility  Bed Mobility Overal bed mobility: Needs Assistance Bed Mobility: Sit to Supine       Sit to supine: Max assist   General bed mobility comments: Pt unable to follow  instructions well this afternoon, fatigued after trying to get back to bed.  Max assist transfer back to supine from EOB  Transfers Overall transfer level: Needs assistance Equipment used: Rolling walker (2 wheeled) Transfers: Sit to/from Stand Sit to Stand: Max assist Stand pivot transfers: Total assist;+2 physical assistance       General transfer comment: Pt able to give some minimal effort through UEs, but was not able to show as much effort as this AM, heavy assist to attain standing  Ambulation/Gait Ambulation/Gait assistance: Total assist Gait Distance (Feet): 0 Feet Assistive device: Rolling walker (2 wheeled)       General Gait Details: Pt unable to do anything that could be considered ambualtion this afternoon, spent a few minutes in standing trying to piece together very small, labored side/shuffle steps to get recliner to bed but she could not make it happen even with heavy phyiscal assist, cuing, encouragement from PT and daughter.  Ultimately after she froze having gone only 2-3 inches to the R (on multiple "steps") PT had to max assist pivot sit back to bed   Stairs             Wheelchair Mobility    Modified Rankin (Stroke Patients Only)       Balance Overall balance assessment: Needs assistance Sitting-balance support: Single extremity supported;Feet supported Sitting balance-Leahy Scale: Fair     Standing balance support: Bilateral upper extremity supported Standing balance-Leahy Scale: Poor Standing  balance comment: Pt was heavily reliant on the walker, needing constant cuing and encouragement.  Needed constant and mod to max assist to keep from leaning/falling backward in standing                            Cognition Arousal/Alertness: Lethargic Behavior During Therapy: Flat affect Overall Cognitive Status: Impaired/Different from baseline                                 General Comments: Pt less alert and less  interactive than this AM's session      Exercises General Exercises - Lower Extremity Ankle Circles/Pumps: AROM;10 reps Quad Sets: Strengthening;10 reps (pt struggled to engage R quads consistently ) Short Arc Quad: AROM;Strengthening;10 reps Heel Slides: AAROM;AROM;10 reps (with resisted leg extensions) Hip ABduction/ADduction: AROM;AAROM;10 reps Other Exercises Other Exercises: Pt educated re: OT role, DME recs, d/c recs, ECS, falls prevention, importance of mobility for funcitonal strenghtening Other Exercises: LBD, bathing, toileting, sit<>stand, SPT, sitting/standing balance/tolerance    General Comments General comments (skin integrity, edema, etc.): On arrival pt on room air, unable to get accurate readings but sats seemed to be ~80 with no labored breathing, donned O2 and pulse ox quickly reading in the high 90s.  HR on arrival again difficult to get reading on but was elevated and manually seemed to be in the 140s range w/o having done any activity.  Did slowly drop to acceptable levels (<100 bpm) before trying to do mobility      Pertinent Vitals/Pain Pain Assessment: No/denies pain Pain Location: continues to have brief impulsive c/o pain with PROM/expected gravity dependent movements but reports no pain at rest    Home Living Family/patient expects to be discharged to:: Skilled nursing facility Living Arrangements: Alone             Additional Comments: daughter's check on her daily, she has access to a walker but gets around in the home w/o AD. Has shower seat    Prior Function Level of Independence: Independent      Comments: daughter' s do errands, groceries, help some with bathing, meals - pt rarely out of the home has had increasing falls recently with ~ 1 fall/wk (?)   PT Goals (current goals can now be found in the care plan section) Acute Rehab PT Goals Patient Stated Goal: go home Progress towards PT goals: Not progressing toward goals - comment (pt weaker  and less alert this afternoon)    Frequency    BID      PT Plan Current plan remains appropriate    Co-evaluation              AM-PAC PT "6 Clicks" Mobility   Outcome Measure  Help needed turning from your back to your side while in a flat bed without using bedrails?: A Lot Help needed moving from lying on your back to sitting on the side of a flat bed without using bedrails?: A Lot Help needed moving to and from a bed to a chair (including a wheelchair)?: Total Help needed standing up from a chair using your arms (e.g., wheelchair or bedside chair)?: Total Help needed to walk in hospital room?: Total Help needed climbing 3-5 steps with a railing? : Total 6 Click Score: 8    End of Session Equipment Utilized During Treatment: Gait belt Activity Tolerance: Patient limited by fatigue;Patient  tolerated treatment well Patient left: with call bell/phone within reach;with bed alarm set;with family/visitor present Nurse Communication: Mobility status PT Visit Diagnosis: Muscle weakness (generalized) (M62.81);Difficulty in walking, not elsewhere classified (R26.2);Pain Pain - Right/Left: Right Pain - part of body: Hip     Time: 1427-1510 PT Time Calculation (min) (ACUTE ONLY): 43 min  Charges:  $Therapeutic Exercise: 8-22 mins $Therapeutic Activity: 23-37 mins                     Malachi Pro, DPT 03/12/2020, 4:20 PM

## 2020-03-12 NOTE — Care Management Important Message (Signed)
Important Message  Patient Details  Name: MCKINLEIGH SCHUCHART MRN: [id number] Date of Birth: 25-Nov-1940   Medicare Important Message Given:  Yes     Johnell Comings 03/12/2020, 11:23 AM

## 2020-03-12 NOTE — Progress Notes (Signed)
Subjective:  Patient seen at noon.   Daughter was at the bedside.   The patient was OOB to a chair.  POD #2 s/p IM fixation for right IT hip fracture.   Patient confused but comfortable.  She does not complain of significant right hip pain.  Objective:   VITALS:   Vitals:   03/11/20 2331 03/12/20 0744 03/12/20 1522 03/12/20 1620  BP: (!) 150/76 (!) 149/69 (!) 124/91 (!) 120/95  Pulse: 80 78 (!) 146 (!) 118  Resp: 16 18 20 20   Temp: 98.3 F (36.8 C) 98.5 F (36.9 C) 97.8 F (36.6 C) 98 F (36.7 C)  TempSrc: Oral Oral Oral Oral  SpO2: 95% 96% 99% 99%  Weight:      Height:        PHYSICAL EXAM: Right lower extremity Neurovascular intact Sensation intact distally Intact pulses distally Dorsiflexion/Plantar flexion intact Incision: dressing C/D/I No cellulitis present Compartment soft  LABS  Results for orders placed or performed during the hospital encounter of 03/09/20 (from the past 24 hour(s))  CBC with Differential/Platelet     Status: Abnormal   Collection Time: 03/12/20  6:07 AM  Result Value Ref Range   WBC 7.4 4.0 - 10.5 K/uL   RBC 2.61 (L) 3.87 - 5.11 MIL/uL   Hemoglobin 7.2 (L) 12.0 - 15.0 g/dL   HCT 03/14/20 (L) 36 - 46 %   MCV 87.0 80.0 - 100.0 fL   MCH 27.6 26.0 - 34.0 pg   MCHC 31.7 30.0 - 36.0 g/dL   RDW 55.7 32.2 - 02.5 %   Platelets 198 150 - 400 K/uL   nRBC 0.0 0.0 - 0.2 %   Neutrophils Relative % 77 %   Neutro Abs 5.7 1.7 - 7.7 K/uL   Lymphocytes Relative 15 %   Lymphs Abs 1.1 0.7 - 4.0 K/uL   Monocytes Relative 7 %   Monocytes Absolute 0.5 0 - 1 K/uL   Eosinophils Relative 0 %   Eosinophils Absolute 0.0 0 - 0 K/uL   Basophils Relative 0 %   Basophils Absolute 0.0 0 - 0 K/uL   Immature Granulocytes 1 %   Abs Immature Granulocytes 0.05 0.00 - 0.07 K/uL  Comprehensive metabolic panel     Status: Abnormal   Collection Time: 03/12/20  6:07 AM  Result Value Ref Range   Sodium 136 135 - 145 mmol/L   Potassium 3.2 (L) 3.5 - 5.1 mmol/L   Chloride  102 98 - 111 mmol/L   CO2 26 22 - 32 mmol/L   Glucose, Bld 82 70 - 99 mg/dL   BUN 20 8 - 23 mg/dL   Creatinine, Ser 03/14/20 0.44 - 1.00 mg/dL   Calcium 7.9 (L) 8.9 - 10.3 mg/dL   Total Protein 5.5 (L) 6.5 - 8.1 g/dL   Albumin 2.2 (L) 3.5 - 5.0 g/dL   AST 19 15 - 41 U/L   ALT 8 0 - 44 U/L   Alkaline Phosphatase 47 38 - 126 U/L   Total Bilirubin 0.7 0.3 - 1.2 mg/dL   GFR calc non Af Amer >60 >60 mL/min   GFR calc Af Amer >60 >60 mL/min   Anion gap 8 5 - 15  Phosphorus     Status: None   Collection Time: 03/12/20  6:07 AM  Result Value Ref Range   Phosphorus 2.9 2.5 - 4.6 mg/dL  Magnesium     Status: None   Collection Time: 03/12/20  6:07 AM  Result Value Ref Range  Magnesium 2.1 1.7 - 2.4 mg/dL    No results found.  Assessment/Plan: 2 Days Post-Op   Active Problems:   Generalized anxiety disorder   HLD (hyperlipidemia)   Fracture   Essential hypertension   Hypokalemia   Acute delirium   Leukocytosis  Patient's hemoglobin 7.2.  Likely dilutional.  Since I saw the patient she has developed a.fib with RVR.  I have discussed this case with Dr. Marland Mcalpine the hospitalist.   The patient is going to be started on heparin.  Will monitor for increased surgical site bleeding and CBCs.  Continue PT as medically appropriate.    Juanell Fairly , MD 03/12/2020, 4:32 PM

## 2020-03-12 NOTE — Evaluation (Signed)
Physical Therapy Evaluation Patient Details Name: Michelle Bauer MRN: [id number] DOB: 08/17/1940 Today's Date: 03/12/2020   History of Present Illness  Pt is a 79 y.o. female with medical history significant of 79 year old female with past medical history of hypertension, hyperlipidemia, anxiety/depression, severe scoliosis, diverticular bleed presented status post fall.  Patient was on her usual health and her daughter was coming to visit her.  She went to unlock to double lock since her daughter did not have the key to the door and she reports a chair was pulled out in the kitchen and somehow she stumbled over it and fell.  She did hit her head against a wall.  Daughter arrived 30 minutes later meanwhile patient was on the floor.  Pt initially refused to come to the ER, but could not walk.  Found to have R hip fx and is now s/p ORIF 9/22.  Clinical Impression  Pt hesitant do really do a lot t/o eval and additional exercises and gait training, however with consistent cuing and encouragement from PT and daughter she did ultimately participate and did reasonably well POD2.  She struggled with mobility and standing and had a lot of guarding and hesitancy with first attempts at [facility] and stepping, but ultimately was able to take a few steps and showed increased step length, mechanics and tolerance with increased gait.  She needed excessive cuing to perform exercises and showed inconsistent general awareness, but considering the severe delirium she had yesterday POD1 she appears to be doing well.  Pt will clearly need STR and did discuss with daughter that eventually returning home alone is worthy of reconsideration/further discussion.    Follow Up Recommendations SNF    Equipment Recommendations   (TBD after rehab, daughter thinks they have a walker)    Recommendations for Other Services       Precautions / Restrictions Precautions Precautions: Fall Restrictions Weight Bearing Restrictions:  Yes RLE Weight Bearing: Weight bearing as tolerated      Mobility  Bed Mobility Overal bed mobility: Needs Assistance Bed Mobility: Supine to Sit     Supine to sit: Mod assist;Max assist     General bed mobility comments: Pt showed some effort, but ultimately needed considerable assist to get to EOB. Unable to elevate her trunk at all on her own, heavy assist to actually attain sitting.   Transfers Overall transfer level: Needs assistance Equipment used: Rolling walker (2 wheeled) Transfers: Sit to/from Stand Sit to Stand: Mod assist;Max assist         General transfer comment: Pt needed repeat cuing and encouragement to initiate transition to standing, heavy assist to get to upright with reliance on walker once up  Ambulation/Gait Ambulation/Gait assistance: Mod assist Gait Distance (Feet): 8 Feet Assistive device: Rolling walker (2 wheeled)       General Gait Details: Pt with very slow, guarded gait with heavy UE reliance and needing plenty of cuing but did show improved cadence/foot clearance and steppage with increased cuing and time on feet.  She ultimately fatigued out after modest distance and requested to sit (vitals were relatively stable on 3L with sats in the mid 90s and HR to mid 90s post ambulation)  Stairs            Wheelchair Mobility    Modified Rankin (Stroke Patients Only)       Balance Overall balance assessment: Needs assistance Sitting-balance support: Single extremity supported Sitting balance-Leahy Scale: Fair Sitting balance - Comments: Once assist to EOB she  was able to maintain sitting with CGA but did have a few episodes of leaning needing min assist to insure safety     Standing balance-Leahy Scale: Poor Standing balance comment: Pt was heavily reliant on the walker, needing constant cuing and encouragement but did not have any overt LOBs.                             Pertinent Vitals/Pain Pain Assessment: No/denies  pain Pain Location: Pt had 2 (anticipatory?) calling out in pain episodes with against gravity movement    Home Living Family/patient expects to be discharged to:: Skilled nursing facility Living Arrangements: Alone               Additional Comments: daughter's check on her daily, she has access to a walker but gets around in the home w/o AD. Has shower seat    Prior Function Level of Independence: Independent         Comments: daughter' s do errands, groceries, help some with bathing, meals - pt rarely out of the home has had increasing falls recently with ~ 1 fall/wk (?)     Hand Dominance        Extremity/Trunk Assessment   Upper Extremity Assessment Upper Extremity Assessment: Generalized weakness    Lower Extremity Assessment Lower Extremity Assessment: Generalized weakness (R hip grossly 3-/5, knee&ankle 3+5/, L LE 4-/5 t/o)       Communication      Cognition Arousal/Alertness: Awake/alert Behavior During Therapy: Anxious Overall Cognitive Status: Impaired/Different from baseline                                 General Comments: Pt with severe delirium POD1 (9/23), much better at time of exam (9/24) though still not at her baseline      General Comments General comments (skin integrity, edema, etc.): Pt needing extra reinforcement, encouragement and cuing to do most acts    Exercises General Exercises - Lower Extremity Ankle Circles/Pumps: AROM;10 reps Quad Sets: Strengthening;10 reps Short Arc Quad: AROM;Strengthening;10 reps Heel Slides: AAROM;AROM;10 reps (with resisted leg extensions) Hip ABduction/ADduction: AROM;AAROM;10 reps   Assessment/Plan    PT Assessment Patient needs continued PT services  PT Problem List Decreased strength;Decreased range of motion;Decreased activity tolerance;Decreased balance;Decreased mobility;Decreased coordination;Decreased cognition;Decreased knowledge of use of DME;Decreased safety  awareness;Pain;Cardiopulmonary status limiting activity       PT Treatment Interventions DME instruction;Gait training;Stair training;Functional mobility training;Therapeutic activities;Therapeutic exercise;Balance training;Neuromuscular re-education;Cognitive remediation;Patient/family education    PT Goals (Current goals can be found in the Care Plan section)  Acute Rehab PT Goals Patient Stated Goal: go home PT Goal Formulation: With patient Time For Goal Achievement: 03/26/20 Potential to Achieve Goals: Fair    Frequency BID   Barriers to discharge        Co-evaluation               AM-PAC PT "6 Clicks" Mobility  Outcome Measure Help needed turning from your back to your side while in a flat bed without using bedrails?: A Little Help needed moving from lying on your back to sitting on the side of a flat bed without using bedrails?: A Lot Help needed moving to and from a bed to a chair (including a wheelchair)?: A Lot Help needed standing up from a chair using your arms (e.g., wheelchair or bedside chair)?: A Lot Help needed to  walk in hospital room?: A Lot Help needed climbing 3-5 steps with a railing? : Total 6 Click Score: 12    End of Session Equipment Utilized During Treatment: Gait belt Activity Tolerance: Patient limited by fatigue;Patient tolerated treatment well Patient left: with chair alarm set;with call bell/phone within reach Nurse Communication: Mobility status PT Visit Diagnosis: Muscle weakness (generalized) (M62.81);Difficulty in walking, not elsewhere classified (R26.2);Pain Pain - Right/Left: Right Pain - part of body: Hip    Time: 0921-1020 PT Time Calculation (min) (ACUTE ONLY): 59 min   Charges:   PT Evaluation $PT Eval Moderate Complexity: 1 Mod PT Treatments $Gait Training: 8-22 mins $Therapeutic Exercise: 8-22 mins        Malachi Pro, DPT 03/12/2020, 12:47 PM

## 2020-03-12 NOTE — TOC Initial Note (Signed)
Transition of Care [facility]) - Initial/Assessment Note    Patient Details  Name: Michelle Bauer MRN: [id number] Date of Birth: Aug 19, 1940  Transition of Care [facility]) CM/SW Contact:    Shelbie Ammons, RN Phone Number: 03/12/2020, 2:54 PM  Clinical Narrative:  RNCM met with patient and daughter in room, patient awake and alert sitting up in recliner but is letting daughter do most of talking. Daughter reports that they understand that patient going to SNF is going to be a necessity at discharge and that they have been in conversation with [facility] and understand that she will need to be vaccinated 2 weeks before going there. Daughter is agreeable to a bed search of other facilities in the area.  RNCM started PASSR, completed FL-2 and initiated bed search.          Expected Discharge Plan: Skilled Nursing Facility Barriers to Discharge: No Barriers Identified   Patient Goals and CMS Choice        Expected Discharge Plan and Services Expected Discharge Plan: [place]   Discharge Planning Services: CM Consult Post Acute Care Choice: [place] Living arrangements for the past 2 months: Single Family Home                                      Prior Living Arrangements/Services Living arrangements for the past 2 months: Single Family Home Lives with:: Self Patient language and need for interpreter reviewed:: Yes Do you feel safe going back to the place where you live?: Yes      Need for Family Participation in Patient Care: Yes (Comment) Care giver support system in place?: Yes (comment)   Criminal Activity/Legal Involvement Pertinent to Current Situation/Hospitalization: No - Comment as needed  Activities of Daily Living Home Assistive Devices/Equipment: None (has avail single pt cane, wc, front wheel wlkr) ADL Screening (condition at time of admission) Patient's cognitive ability adequate to safely complete daily activities?: Yes Is the  patient deaf or have difficulty hearing?: Yes Does the patient have difficulty seeing, even when wearing glasses/contacts?: Yes Does the patient have difficulty concentrating, remembering, or making decisions?: Yes Patient able to express need for assistance with ADLs?: Yes Does the patient have difficulty dressing or bathing?: Yes Independently performs ADLs?: Yes (appropriate for developmental age) Does the patient have difficulty walking or climbing stairs?: Yes Weakness of Legs: Both Weakness of Arms/Hands: Both  Permission Sought/Granted                  Emotional Assessment Appearance:: Appears stated age     Orientation: : Oriented to Place, Oriented to Self, Oriented to Situation Alcohol / Substance Use: Not Applicable Psych Involvement: No (comment)  Admission diagnosis:  Fracture [T14.8XXA] Fall [W19.XXXA] Pre-op chest exam [I68.032] Closed right hip fracture, initial encounter [facility]) [S72.001A] Patient Active Problem List   Diagnosis Date Noted  . Acute delirium 03/11/2020  . Leukocytosis 03/11/2020  . Fracture 03/09/2020  . Essential hypertension 03/09/2020  . Hypokalemia 03/09/2020  . Hematochezia   . Rectal bleeding   . GI bleed 03/05/2018  . Encounter for long-term (current) use of other medications 09/25/2017  . Chronic diarrhea 09/11/2017  . CAD in native artery 11/08/2015  . Other long term (current) drug therapy 11/08/2015  . Elevated fasting blood sugar 11/08/2015  . Generalized anxiety disorder 12/07/2014  . Back ache 02/14/2014  . Scoliosis 05/27/2012  . BP (  high blood pressure) 11/20/2011  . HLD (hyperlipidemia) 11/20/2011   PCP:  Kirk Ruths, MD Pharmacy:   [id number], [place] [address] Phone: [phone] Fax: [phone]  [id number] - [place], [place] - [place] [address] [place] [address] [place] [address] Phone: [phone] Fax: [phone]     Social Determinants of Health ([place]) Interventions    Readmission Risk Interventions No flowsheet data found.

## 2020-03-12 NOTE — Progress Notes (Addendum)
PROGRESS NOTE    Michelle Bauer  AVW:[id number] DOB: 14-Feb-1941 DOA: 03/09/2020 PCP: [name], MD   Brief Narrative:  HPI per Dr. [name] on 03/09/20 Michelle Bauer is a 79 y.o. female with medical history significant of 79 year old female with past medical history of hypertension, hyperlipidemia, anxiety/depression, severe scoliosis, diverticular bleed presented status post mechanical fall which occurred yesterday.  Patient was on her usual health and her daughter was coming to visit her.  She went to unlock to double lock since her daughter did not have the key to the door and she reports a chair was pulled out in the kitchen and somehow she stumbled over it and fell.  She did hit her head against a wall.  Daughter arrived 30 minutes later meanwhile patient was on the floor.  This occurred yesterday and patient refused to come to the ER.  Daughter realized patient is unable to walk with a call patient's PCP and PCP recommended them to call EMS. She reports pain of rt hip as she points to it. Denies any other pain.  She denies headache, vision change, chest pain or shortness of breath.  Daughter thinks she is starting to have some dementia signs but has not been evaluated.  Patient reports chronic diarrhea however now is more formed and soft with Metamucil as she takes it every day.  Patient is Covid negative.  However she has not had her vaccination yet.  Daughters have been vaccinated.  ED Course: Blood pressure 137/78 afebrile, on room air 97%  Review of Systems: All systems reviewed and otherwise negative.  Potassium 3.3, creatinine 1.05, glucose 135 BUN 24 WBC 8.7 hemoglobin 9.4 hematocrit 29.6 MCV 87.6  Hip x-ray revealed acute displaced intertrochanteric fracture of the proximal right femur  Chest x-ray personally reviewed no acute cardiopulmonary disease EKG personally reviewed sinus rhythm no ischemic ST changes  **Interim History Patient is to underwent  operative procedure 03/10/20 for the patient's hip fracture postoperatively she became confused and agitated and likely had delirium in the setting of her anesthesia.  She is much more awake alert and oriented today.  Also her hospital course has been complicated by her hemoglobin dropping as well as having a vitamin B12 deficiency.  Today she also had a hypokalemia which will be replete.  PT OT evaluated but still up to put their formal recommendations and and orthopedic surgery recommending weightbearing as tolerated.  Assessment & Plan:   Active Problems:   Generalized anxiety disorder   HLD (hyperlipidemia)   Fracture   Essential hypertension   Hypokalemia   Acute delirium   Leukocytosis  Acute Right Proximal Femur Fracture in the setting of Mechanical Fall status post intramedullary fixation of the right intertrochanteric hip fracture postoperative day 2 -Admitted to inpatient MedSurg  -Hip X-Ray showed "Acute displaced intratrochanteric fracture of the proximal right femur." -ED spoke to Dr. [name] of Orthopedics and plan is for Surgical intervention today (scheduled for 1 pm) -Continue with pain management and was given half a milligram of Dilaudid once in the ED.  Continue with hydrocodone-acetaminophen 1-2 tabs p.o. every 6 hours as needed moderate pain, IV morphine 0.5 mg every 2 as needed severe pain -Continue with supportive care and antiemetics with IV Zofran 4 mg every 6 hours as needed nausea or vomiting -She was n.p.o. after midnight for the procedure and is now on a regular diet -Bowel regimen initiated and she is on psyllium 1 packet p.o. daily, senna docusate 1 tab  p.o. nightly as needed for mild constipation, as well as bisacodyl 5 mg p.o. daily as needed for moderate constipation adjust as necessary -Continue IV fluid hydration with lactated Ringer's at 75 mils per hour for now and discontinue once she is out of surgery and eating -Head CT obtained since she did fall and  showed age indeterminate small vessel ischemic changes in the white matter which was favored to be chronic and no acute intra cranial trauma noted. -Deferring pharmacological VTE prophylaxis to orthopedic surgery and she will be started on Lovenox today; currently on SCDs and will -PT OT evaluating but formal note is not put in yet so do not know other recommendations -Dr. [name] recommending it when she is less confused and recommends weightbearing as tolerated in the right lower extremity -Discharge planning either to SNF versus home with home health PT OT  Acute Post-Operative Delirium, improved -Postoperatively and likely secondary to anesthesia -Became extremely agitated last night and pulled out both her IVs and had to be placed in soft mitten restraints -Haloperidol 2.5 mg IV every 6 as needed agitation -Reorient frequently and will place on delirium precautions -If not improving will consider head MRI given that she just recently had a head CT scan and will do further work-up with a TSH, RPR, vitamin B12 level -Delirium is now improved   Essential Hypertension -On hydrochlorothiazide PTA only which will be held -Blood pressure currently stable and since she is going to surgery will hold off and place her on IV -hydralazine as needed for systolic blood pressure more than 160 -Hold off on HCTZ  Also because of hypokalemia -Last BP reading was 149/69  Hyperlipidemia -LFTs normal on admission with an AST of 28 and ALT of 12; now AST is 19, ALT is 8 -Continue home Atorvastatin 40 mg po qHS and Omega-3 Acid Ethyl Esters 1 gram po Daily with Lunch  Leukocytosis, improved -Mild likely reactive and is improved  -Patient WBC trended up to 11.5 and is now 7.4; if continues to worsen we will panculture -Likely in the setting of her surgery -Continue to monitor for signs and symptoms of infection -Repeat CBC in a.m.  Hypokalemia  -K+ on Admission was 3.3 and today it is  3.2 -Replete with po KCl 40 mEQ BID x2 doses  -Continue to Monitor and Replete as Necessary -Repeat BMP in the AM   Depression/Anxiety -C/w Escitalopram 5 mg po BID -Pharmacy has verified the patient takes clonazepam 0.5 mg 4 times daily and this will be resumed  Acute Blood Loss Anemia on Chronic Normocytic Anemia -Patient's Hgb/Hct went from 9.4/29.6 -> 10.1/30.1 and dropped to 8.4/26.3 after surgery yesterday and today is 7.2/22.7 -? If Further Drop could have been dilutional given that she was receiving IVF with NS at 75 mL/hr; Will stop IVF today  -Anemia panel was checked and showed an iron level of 32, U IBC 150, TIBC 182, saturation ratios of 18%, ferritin level 194, folate level of 8.2 vitamin B12 level was 128 -Continue to Monitor for S/Sx of Bleeding; Currently no overt bleeding noted -She was also started on Enoxaparin 40 mg sq q24h -Orthopedic Surgery started the patient on ferrous sulfate 325 mg p.o. 3 times daily  -As expected she did have a drop some post-operatively  -Repeat CBC in the AM  Vitamin B12 Deficiency -Patient's vitamin B12 level was checked and was 128 and low -Given cyanocobalamin 1000 mcg intramuscular injection once and will start p.o. supplementation in the a.m.  Hyperglycemia  -  Paitent's Blood Sugar on Admission was 135 and repeat was 125 -Check HbA1c was 5.3 -Continue to Monitor Blood Sugars carefully and if necessary will place on Sensitive Novolog SSI  **ADDENDUM 1630  New Onset A Fib with RVR -Transfer to PCU -Start Cardizem Drip and Bolus -Start Heparin gtt with no bolus given Hgb of 7.2 -EKG confirmed with Rates in the 160's -Check CXR, Troponins, and ECHOCardiogram -Cardiology Consulted for further evaluation and Recommendations  Acquired Thrombophilia -In the Setting of Atrial Fibrillation -CHA2DS2-VASc at least a 4 for Age, Sex, and HTN -Anticoagulating with Heparin as above  DVT prophylaxis: SCDs; Heparin gtt  Code Status: DO  NOT RESUSCITATE  Family Communication: Spoke to daughter at bedside Disposition Plan: Pending Ortho clearance as well as evaluation by PT and OT; Now needs Cardiac Clearance as well  Status is: Inpatient  Remains inpatient appropriate because:Unsafe d/c plan, IV treatments appropriate due to intensity of illness or inability to take PO and Inpatient level of care appropriate due to severity of illness   Dispo: The patient is from: Home              Anticipated d/c is to: SNF vs Home with Home Health PT/OT              Anticipated d/c date is: 1 day              Patient currently is not medically stable to d/c.  Consultants:   Orthopedic Surgery Dr. Juanell Fairly   Cardiology Dr. Mariah Milling  Procedures: INTRAMEDULLARY FIXATION OF RIGHT INTERTROCHANTERIC HIP FRACTURE POD 2 by Dr. Rico Ala  Antimicrobials:  Anti-infectives (From admission, onward)   Start     Dose/Rate Route Frequency Ordered Stop   03/10/20 2000  ceFAZolin (ANCEF) IVPB 2g/100 mL premix        2 g 200 mL/hr over 30 Minutes Intravenous Every 6 hours 03/10/20 1657 03/11/20 0641   03/10/20 1431  gentamicin 80 mg in 0.9% sodium chloride 250 mL irrigation  Status:  Discontinued          As needed 03/10/20 1431 03/10/20 1502   03/10/20 1300  ceFAZolin (ANCEF) IVPB 2g/100 mL premix        2 g 200 mL/hr over 30 Minutes Intravenous 30 min pre-op 03/10/20 1220 03/10/20 1310   03/10/20 1250  ceFAZolin (ANCEF) 2-4 GM/100ML-% IVPB       Note to Pharmacy: Agnes Lawrence  : cabinet override      03/10/20 1250 03/10/20 1332        Subjective: Seen and examined at bedside and is sitting in the chair at bedside and had worked with therapy.  Daughter was impressed by how well she did with therapy today and actually had some weightbearing as tolerated.  No nausea or vomiting.  Not as confused today.  Has not been eating very much but did not like the food.  No other concerns or complaints at this time.  Objective: Vitals:    03/11/20 0805 03/11/20 1500 03/11/20 2331 03/12/20 0744  BP: (!) 158/63 135/67 (!) 150/76 (!) 149/69  Pulse: 91 80 80 78  Resp: 18  16 18   Temp: 98.4 F (36.9 C)  98.3 F (36.8 C) 98.5 F (36.9 C)  TempSrc: Oral  Oral Oral  SpO2: 94% 99% 95% 96%  Weight:      Height:        Intake/Output Summary (Last 24 hours) at 03/12/2020 1205 Last data filed at 03/12/2020 0900 Gross per 24  hour  Intake 556.69 ml  Output 350 ml  Net 206.69 ml   Filed Weights   03/09/20 1428  Weight: 56 kg   Examination: Physical Exam:  Constitutional: The patient is a thin elderly Caucasian female currently in no acute distress she is much more awake and alert today sitting in the chair at bedside Eyes: Lids and conjunctivae normal, sclerae anicteric  ENMT: External Ears, Nose appear normal. Grossly normal hearing. Neck: Appears normal, supple, no cervical masses, normal ROM, no appreciable thyromegaly; no JVD Respiratory: Diminished to auscultation bilaterally, no wheezing, rales, rhonchi or crackles. Normal respiratory effort and patient is not tachypenic. No accessory muscle use.  Unlabored breathing and she has been weaned off of supplemental oxygen via nasal cannula Cardiovascular: RRR, no murmurs / rubs / gallops. S1 and S2 auscultated. Abdomen: Soft, non-tender, non-distended. Bowel sounds positive.  GU: Deferred. Musculoskeletal: No clubbing / cyanosis of digits/nails. No joint deformity upper and lower extremities. Skin: No rashes, lesions, ulcers on limited skin evaluation. No induration; Warm and dry.  Neurologic: CN 2-12 grossly intact with no focal deficits. Romberg sign and cerebellar reflexes not assessed.  Psychiatric: Normal judgment and insight. Alert and oriented x 3. Normal mood and appropriate affect.   Data Reviewed: I have personally reviewed following labs and imaging studies  CBC: Recent Labs  Lab 03/09/20 1440 03/10/20 0412 03/11/20 0816 03/12/20 0607  WBC 8.7 9.9 11.5* 7.4   NEUTROABS 6.8  --  9.5* 5.7  HGB 9.4* 10.1* 8.4* 7.2*  HCT 29.6* 30.1* 26.3* 22.7*  MCV 87.6 85.3 89.2 87.0  PLT 225 223 204 198   Basic Metabolic Panel: Recent Labs  Lab 03/09/20 1440 03/10/20 0412 03/11/20 0816 03/12/20 0607  NA 137 135 136 136  K 3.3* 3.7 3.6 3.2*  CL 96* 99 99 102  CO2 27 27 28 26   GLUCOSE 135* 125* 111* 82  BUN 24* 23 24* 20  CREATININE 1.05* 0.92 0.93 0.62  CALCIUM 8.6* 8.6* 8.3* 7.9*  MG  --   --  1.8 2.1  PHOS  --   --  3.0 2.9   GFR: Estimated Creatinine Clearance: 52.1 mL/min (by C-G formula based on SCr of 0.62 mg/dL). Liver Function Tests: Recent Labs  Lab 03/09/20 1440 03/11/20 0816 03/12/20 0607  AST 28 21 19   ALT 12 12 8   ALKPHOS 72 53 47  BILITOT 0.6 0.7 0.7  PROT 7.2 6.3* 5.5*  ALBUMIN 2.9* 2.6* 2.2*   No results for input(s): LIPASE, AMYLASE in the last 168 hours. No results for input(s): AMMONIA in the last 168 hours. Coagulation Profile: Recent Labs  Lab 03/09/20 1823  INR 1.1   Cardiac Enzymes: No results for input(s): CKTOTAL, CKMB, CKMBINDEX, TROPONINI in the last 168 hours. BNP (last 3 results) No results for input(s): PROBNP in the last 8760 hours. HbA1C: Recent Labs    03/11/20 0816  HGBA1C 5.3   CBG: No results for input(s): GLUCAP in the last 168 hours. Lipid Profile: No results for input(s): CHOL, HDL, LDLCALC, TRIG, CHOLHDL, LDLDIRECT in the last 72 hours. Thyroid Function Tests: No results for input(s): TSH, T4TOTAL, FREET4, T3FREE, THYROIDAB in the last 72 hours. Anemia Panel: Recent Labs    03/11/20 0816  VITAMINB12 128*  FOLATE 8.2  FERRITIN 194  TIBC 182*  IRON 32  RETICCTPCT 2.9   Sepsis Labs: No results for input(s): PROCALCITON, LATICACIDVEN in the last 168 hours.  Recent Results (from the past 240 hour(s))  SARS Coronavirus 2 by  RT PCR (hospital order, performed in [facility] lab) Nasopharyngeal Nasopharyngeal Swab     Status: None   Collection Time: 03/09/20  2:31 PM    Specimen: Nasopharyngeal Swab  Result Value Ref Range Status   SARS Coronavirus 2 NEGATIVE NEGATIVE Final    Comment: (NOTE) SARS-CoV-2 target nucleic acids are NOT DETECTED.  The SARS-CoV-2 RNA is generally detectable in upper and lower respiratory specimens during the acute phase of infection. The lowest concentration of SARS-CoV-2 viral copies this assay can detect is 250 copies / mL. A negative result does not preclude SARS-CoV-2 infection and should not be used as the sole basis for treatment or other patient management decisions.  A negative result may occur with improper specimen collection / handling, submission of specimen other than nasopharyngeal swab, presence of viral mutation(s) within the areas targeted by this assay, and inadequate number of viral copies (<250 copies / mL). A negative result must be combined with clinical observations, patient history, and epidemiological information.  Fact Sheet for Patients:   BoilerBrush.com.cyhttps://www.fda.gov/media/136312/download  Fact Sheet for Healthcare Providers: https://pope.com/https://www.fda.gov/media/136313/download  This test is not yet approved or  cleared by the [place] FDA and has been authorized for detection and/or diagnosis of SARS-CoV-2 by FDA under an Emergency Use Authorization (EUA).  This EUA will remain in effect (meaning this test can be used) for the duration of the COVID-19 declaration under Section 564(b)(1) of the Act, 21 U.S.C. section 360bbb-3(b)(1), unless the authorization is terminated or revoked sooner.  Performed at [facility], [address] [place], [place] [address]   Surgical PCR screen     Status: Abnormal   Collection Time: 03/09/20  9:25 PM   Specimen: Nasal Mucosa; Nasal Swab  Result Value Ref Range Status   MRSA, PCR NEGATIVE NEGATIVE Final   Staphylococcus aureus POSITIVE (A) NEGATIVE Final    Comment: (NOTE) The Xpert SA Assay (FDA approved for NASAL specimens in patients 78 years of age  and older), is one component of a comprehensive surveillance program. It is not intended to diagnose infection nor to guide or monitor treatment. Performed at [facility], [address] [place], [place] [address]     RN Pressure Injury Documentation:     Estimated body mass index is 20.54 kg/m as calculated from the following:   Height as of this encounter: 5\' 5"  (1.651 m).   Weight as of this encounter: 56 kg.  Malnutrition Type:      Malnutrition Characteristics:      Nutrition Interventions:    Radiology Studies: DG HIP OPERATIVE UNILAT W OR W/O PELVIS RIGHT  Result Date: 03/10/2020 CLINICAL DATA:  Right hip fracture EXAM: OPERATIVE right HIP (WITH PELVIS IF PERFORMED) 16 VIEWS TECHNIQUE: Fluoroscopic spot image(s) were submitted for interpretation post-operatively. COMPARISON:  03/10/2020 FINDINGS: C-arm images demonstrate fixation of right intertrochanteric fracture with compression screw and locking intramedullary rod extending into the distal femur. Fracture alignment satisfactory IMPRESSION: Right intertrochanteric fracture ORIF. Electronically Signed   By: Marlan Palauharles  Clark M.D.   On: 03/10/2020 16:44   DG FEMUR, MIN 2 VIEWS RIGHT  Result Date: 03/10/2020 CLINICAL DATA:  Postop intramedullary nail for fracture EXAM: RIGHT FEMUR 2 VIEWS COMPARISON:  03/09/2020 FINDINGS: Intertrochanteric fracture has been fixed with a screw and locking intramedullary rod. Hardware in good position. Fracture alignment satisfactory. IMPRESSION: ORIF intertrochanteric fracture on the right. Electronically Signed   By: Marlan Palauharles  Clark M.D.   On: 03/10/2020 16:19   Scheduled Meds: . atorvastatin  40 mg Oral Q supper  . Chlorhexidine Gluconate Cloth  6 each Topical Q0600  . clonazePAM  0.5 mg Oral QID  . cyanocobalamin  1,000 mcg Intramuscular Once  . docusate sodium  100 mg Oral BID  . enoxaparin (LOVENOX) injection  40 mg Subcutaneous Q24H  . escitalopram  5 mg Oral Daily  .  ferrous sulfate  325 mg Oral TID PC  . multivitamin with minerals  1 tablet Oral Daily  . mupirocin ointment  1 application Nasal BID  . omega-3 acid ethyl esters  1 g Oral Q lunch  . potassium chloride  40 mEq Oral BID  . psyllium  1 packet Oral Daily  . senna  1 tablet Oral BID  . traMADol  100 mg Oral TID   Continuous Infusions: . lactated ringers 75 mL/hr at 03/10/20 1255  . methocarbamol (ROBAXIN) IV      LOS: 3 days   Merlene Laughter, DO [facility] PAGER is on AMION  If 7PM-7AM, please contact night-coverage [email or web address]

## 2020-03-12 NOTE — Consult Note (Signed)
Cardiology Consultation:   Patient ID: CHANESE Bauer MRN: [id number]; DOB: 04-05-1941  Admit date: 03/09/2020 Date of Consult: 03/12/2020  Primary Care Provider: [name], MD [facility] Cardiologist: new to [facility] Physician requesting consult: Dr. Marland Mcalpine Reason for consult : New onset atrial fibrillation with RVR   Patient Profile:   Michelle Bauer is a 79 y.o. female with a hx of hypertension, hyperlipidemia, severe scoliosis, possible dementia presenting to the hospital after mechanical fall, hip fracture, post surgery developing atrial fibrillation with RVR  History of Present Illness:   Michelle Bauer presented to the hospital September 21 after a mechanical fall the night before.  Reports that she fell over a kitchen chair, hit her head on a wall, daughter arrived 30 minutes later to the house found her on the ground, deformity to the right hip.   Hip x-ray revealed acute displaced intertrochanteric fracture of the proximal right femur  On IV fluids Currently postop day 2 s/p IM fixation for right IT hip fracture Hemoglobin 7.2 Developed atrial fibrillation with RVR rates up to 130 bpm Potassium 3.2  It appears that she has been started on diltiazem infusion, heparin infusion  Chest x-ray with pulmonary edema  Troponin 1500 Denies any chest pain or shortness of breath   Past Medical History:  Diagnosis Date  . Anxiety   . Depression   . Essential hypertension   . Hyperlipidemia     Past Surgical History:  Procedure Laterality Date  . ABDOMINAL HYSTERECTOMY    . BYPASS AXILLA/BRACHIAL ARTERY    . CHOLECYSTECTOMY    . COLONOSCOPY WITH PROPOFOL N/A 03/07/2018   Procedure: COLONOSCOPY WITH PROPOFOL;  Surgeon: Midge Minium, MD;  Location: [facility] ENDOSCOPY;  Service: Endoscopy;  Laterality: N/A;  . INTRAMEDULLARY (IM) NAIL INTERTROCHANTERIC Right 03/10/2020   Procedure: INTRAMEDULLARY (IM) NAIL INTERTROCHANTRIC;  Surgeon: Juanell Fairly, MD;   Location: [facility] ORS;  Service: Orthopedics;  Laterality: Right;     Home Medications:  Prior to Admission medications   Medication Sig Start Date End Date Taking? Authorizing Provider  clonazePAM (KLONOPIN) 0.5 MG tablet TAKE 1 TABLET(0.5 MG) BY MOUTH FOUR TIMES DAILY 12/29/19  Yes Clapacs, Jackquline Denmark, MD  escitalopram (LEXAPRO) 5 MG tablet TAKE 1 TABLET(5 MG) BY MOUTH DAILY Patient taking differently: Take 5 mg by mouth in the morning and at bedtime. TAKE 1 TABLET(5 MG) BY MOUTH DAILY 12/05/18  Yes Clapacs, Jackquline Denmark, MD  Multiple Vitamins-Minerals (ONE-A-DAY WOMENS 50 PLUS) TABS Take 1 tablet by mouth daily.   Yes [provider]  Potassium Gluconate 550 MG TABS Take 1,100 mg by mouth daily.   Yes [provider]  traMADol (ULTRAM) 50 MG tablet Take 2 tablets (100 mg total) by mouth in the morning, at noon, and at bedtime. 12/29/19  Yes Clapacs, Jackquline Denmark, MD  aspirin 81 MG EC tablet Take 81 mg by mouth daily.     [provider]    Inpatient Medications: Scheduled Meds: . sodium chloride   Intravenous Once  . atorvastatin  40 mg Oral Q supper  . Chlorhexidine Gluconate Cloth  6 each Topical Q0600  . clonazePAM  0.5 mg Oral QID  . docusate sodium  100 mg Oral BID  . escitalopram  5 mg Oral Daily  . ferrous sulfate  325 mg Oral TID PC  . multivitamin with minerals  1 tablet Oral Daily  . mupirocin ointment  1 application Nasal BID  . omega-3 acid ethyl esters  1 g Oral Q  lunch  . potassium chloride  40 mEq Oral BID  . psyllium  1 packet Oral Daily  . senna  1 tablet Oral BID  . traMADol  100 mg Oral TID   Continuous Infusions: . diltiazem (CARDIZEM) infusion 10 mg/hr (03/12/20 1837)  . heparin 750 Units/hr (03/12/20 1800)  . methocarbamol (ROBAXIN) IV     PRN Meds: acetaminophen, alum & mag hydroxide-simeth, bisacodyl, bisacodyl, haloperidol lactate, hydrALAZINE, HYDROcodone-acetaminophen, magnesium citrate, menthol-cetylpyridinium **OR** phenol, methocarbamol  **OR** methocarbamol (ROBAXIN) IV, morphine injection, ondansetron **OR** ondansetron (ZOFRAN) IV, polyethylene glycol, promethazine, senna-docusate  Allergies:    Allergies  Allergen Reactions  . Other Other (See Comments)  . Penicillins Other (See Comments)    Has patient had a PCN reaction causing immediate rash, facial/tongue/throat swelling, SOB or lightheadedness with hypotension: Unknown Has patient had a PCN reaction causing severe rash involving mucus membranes or skin necrosis: Unknown Has patient had a PCN reaction that required hospitalization: Unknown Has patient had a PCN reaction occurring within the last 10 years: No If all of the above answers are "NO", then may proceed with Cephalosporin use.  . Sulfa Antibiotics Other (See Comments)    Social History:   Social History   Socioeconomic History  . Marital status: Married    Spouse name: Not on file  . Number of children: Not on file  . Years of education: Not on file  . Highest education level: Not on file  Occupational History  . Not on file  Tobacco Use  . Smoking status: Never Smoker  . Smokeless tobacco: Never Used  Substance and Sexual Activity  . Alcohol use: No    Alcohol/week: 0.0 standard drinks  . Drug use: No  . Sexual activity: Not Currently  Other Topics Concern  . Not on file  Social History Narrative  . Not on file   Social Determinants of Health   Financial Resource Strain:   . Difficulty of Paying Living Expenses: Not on file  Food Insecurity:   . Worried About Programme researcher, broadcasting/film/video in the Last Year: Not on file  . Ran Out of Food in the Last Year: Not on file  Transportation Needs:   . Lack of Transportation (Medical): Not on file  . Lack of Transportation (Non-Medical): Not on file  Physical Activity:   . Days of Exercise per Week: Not on file  . Minutes of Exercise per Session: Not on file  Stress:   . Feeling of Stress : Not on file  Social Connections:   . Frequency of  Communication with Friends and Family: Not on file  . Frequency of Social Gatherings with Friends and Family: Not on file  . Attends Religious Services: Not on file  . Active Member of Clubs or Organizations: Not on file  . Attends Banker Meetings: Not on file  . Marital Status: Not on file  Intimate Partner Violence:   . Fear of Current or Ex-Partner: Not on file  . Emotionally Abused: Not on file  . Physically Abused: Not on file  . Sexually Abused: Not on file    Family History:    Family History  Problem Relation Age of Onset  . Stroke Mother   . Heart attack Father   . Alcohol abuse Father   . Stroke Sister   . Diabetes Brother   . Colon cancer Sister      ROS:  Please see the history of present illness.  Review of Systems  Constitutional: Negative.  HENT: Negative.   Respiratory: Negative.   Cardiovascular: Negative.   Gastrointestinal: Negative.   Musculoskeletal: Positive for joint pain.  Neurological: Negative.   Psychiatric/Behavioral: Negative.   All other systems reviewed and are negative.    Physical Exam/Data:   Vitals:   03/12/20 1800 03/12/20 1810 03/12/20 1823 03/12/20 1837  BP: 113/81 122/67 (!) 144/62 (!) 123/99  Pulse: (!) 135 (!) 132 (!) 128 (!) 131  Resp: 20  18 20   Temp:      TempSrc:      SpO2: 93% 93% 92% 94%  Weight:   54.4 kg   Height:   5\' 1"  (1.549 m)     Intake/Output Summary (Last 24 hours) at 03/12/2020 1924 Last data filed at 03/12/2020 0900 Gross per 24 hour  Intake 120 ml  Output 350 ml  Net -230 ml   Last 3 Weights 03/12/2020 03/09/2020 04/15/2018  Weight (lbs) 120 lb 123 lb 7.3 oz 141 lb 8 oz  Weight (kg) 54.432 kg 56 kg 64.184 kg  Some encounter information is confidential and restricted. Go to Review Flowsheets activity to see all data.     Body mass index is 22.67 kg/m.  General:  Well nourished, well developed, in no acute distress HEENT: normal Lymph: no adenopathy Neck: Unable to estimate  JVD Endocrine:  No thryomegaly Vascular: No carotid bruits; FA pulses 2+ bilaterally without bruits  Cardiac: Irregularly irregular, rapid  no murmur  Lungs: Poor inspiratory effort, Rales Abd: soft, nontender, no hepatomegaly  Ext: no edema Musculoskeletal:  No deformities, BUE and BLE strength normal and equal Skin: warm and dry  Neuro:  CNs 2-12 intact, no focal abnormalities noted Psych:  Normal affect   EKG:  The EKG was personally reviewed and demonstrates:   Showing atrial fibrillation with ventricular rate 164 bpm nonspecific ST-T wave abnormality  Telemetry:  Telemetry was personally reviewed and demonstrates: Atrial fibrillation with rate in the 130s  Relevant CV Studies: Echocardiogram pending  Chest x-ray . Findings consistent with congestive heart failure, with worsening vascular congestion, left basilar consolidation, and new left pleural effusion.  Laboratory Data:  High Sensitivity Troponin:   Recent Labs  Lab 03/12/20 1804  TROPONINIHS 1,523*     Chemistry Recent Labs  Lab 03/10/20 0412 03/11/20 0816 03/12/20 0607  NA 135 136 136  K 3.7 3.6 3.2*  CL 99 99 102  CO2 27 28 26   GLUCOSE 125* 111* 82  BUN 23 24* 20  CREATININE 0.92 0.93 0.62  CALCIUM 8.6* 8.3* 7.9*  GFRNONAA >60 59* >60  GFRAA >60 >60 >60  ANIONGAP 9 9 8     Recent Labs  Lab 03/09/20 1440 03/11/20 0816 03/12/20 0607  PROT 7.2 6.3* 5.5*  ALBUMIN 2.9* 2.6* 2.2*  AST 28 21 19   ALT 12 12 8   ALKPHOS 72 53 47  BILITOT 0.6 0.7 0.7   Hematology Recent Labs  Lab 03/10/20 0412 03/11/20 0816 03/12/20 0607  WBC 9.9 11.5* 7.4  RBC 3.53* 2.95*  3.02* 2.61*  HGB 10.1* 8.4* 7.2*  HCT 30.1* 26.3* 22.7*  MCV 85.3 89.2 87.0  MCH 28.6 28.5 27.6  MCHC 33.6 31.9 31.7  RDW 13.4 13.6 13.8  PLT 223 204 198   BNPNo results for input(s): BNP, PROBNP in the last 168 hours.  DDimer No results for input(s): DDIMER in the last 168 hours.   Radiology/Studies:  DG Chest 1 View  Result  Date: 03/12/2020 CLINICAL DATA:  Hypoxia EXAM: CHEST  1 VIEW COMPARISON:  03/09/2020 FINDINGS: Single frontal view of the chest demonstrates marked right convex scoliosis. The cardiac silhouette is enlarged. 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 Since the previous exam, left basilar consolidation and left pleural effusion have developed. There is increased central vascular congestion. No pneumothorax. IMPRESSION: 1. Findings consistent with congestive heart failure, with worsening vascular congestion, left basilar consolidation, and new left pleural effusion. Electronically Signed   By: Sharlet Salina M.D.   On: 03/12/2020 17:54   DG Chest 1 View  Result Date: 03/09/2020 CLINICAL DATA:  Pain EXAM: CHEST  1 VIEW COMPARISON:  None. FINDINGS: There is a significant S shaped curvature of the thoracolumbar spine. The heart size appears mildly enlarged. The patient is status post prior median sternotomy. There is no pneumothorax or large pleural effusion. There is no acute osseous abnormality. IMPRESSION: No active disease. Electronically Signed   By: Katherine Mantle M.D.   On: 03/09/2020 15:15   CT HEAD WO CONTRAST  Result Date: 03/09/2020 CLINICAL DATA:  Larey Seat today, hip fracture EXAM: CT HEAD WITHOUT CONTRAST TECHNIQUE: Contiguous axial images were obtained from the base of the skull through the vertex without intravenous contrast. COMPARISON:  None. FINDINGS: Brain: Scattered hypodensities are seen throughout the periventricular white matter consistent with age-indeterminate small vessel ischemic changes, likely chronic. No other signs of acute infarct or hemorrhage. Lateral ventricles and midline structures are unremarkable. No acute extra-axial fluid collections. No mass effect. Vascular: No hyperdense vessel or unexpected calcification. Skull: Normal. Negative for fracture or focal lesion. Sinuses/Orbits: No acute finding. Other: None. IMPRESSION: 1. Age-indeterminate small-vessel ischemic  changes in the white matter, favor chronic. 2. No acute intracranial trauma. Electronically Signed   By: Sharlet Salina M.D.   On: 03/09/2020 17:47   DG HIP OPERATIVE UNILAT W OR W/O PELVIS RIGHT  Result Date: 03/10/2020 CLINICAL DATA:  Right hip fracture EXAM: OPERATIVE right HIP (WITH PELVIS IF PERFORMED) 16 VIEWS TECHNIQUE: Fluoroscopic spot image(s) were submitted for interpretation post-operatively. COMPARISON:  03/10/2020 FINDINGS: C-arm images demonstrate fixation of right intertrochanteric fracture with compression screw and locking intramedullary rod extending into the distal femur. Fracture alignment satisfactory IMPRESSION: Right intertrochanteric fracture ORIF. Electronically Signed   By: Marlan Palau M.D.   On: 03/10/2020 16:44   DG Hip Unilat W or Wo Pelvis 2-3 Views Right  Result Date: 03/09/2020 CLINICAL DATA:  Pain EXAM: DG HIP (WITH OR WITHOUT PELVIS) 2-3V RIGHT COMPARISON:  None. FINDINGS: There is an acute displaced intratrochanteric fracture of the proximal right femur. There is no dislocation. There is osteopenia. 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 Vascular calcifications are noted. IMPRESSION: Acute displaced intratrochanteric fracture of the proximal right femur. Electronically Signed   By: Katherine Mantle M.D.   On: 03/09/2020 15:14   DG FEMUR, MIN 2 VIEWS RIGHT  Result Date: 03/10/2020 CLINICAL DATA:  Postop intramedullary nail for fracture EXAM: RIGHT FEMUR 2 VIEWS COMPARISON:  03/09/2020 FINDINGS: Intertrochanteric fracture has been fixed with a screw and locking intramedullary rod. Hardware in good position. Fracture alignment satisfactory. IMPRESSION: ORIF intertrochanteric fracture on the right. Electronically Signed   By: Marlan Palau M.D.   On: 03/10/2020 16:19   {   Assessment and Plan:   1. Atrial fibrillation with RVR Rapid rate today onset after 3 PM March 12, 2020 In the setting of IV fluids, low potassium, postop day 2 hip surgery Rate  up to 160s Started on heparin and diltiazem infusion by hospitalist service Rate still in the 130 range, up to 160  range Chest x-ray concerning for pulmonary edema --- Recommend we hold IV fluids, give dose Lasix 20 mg IV, Replete potassium, digoxin 0.25 mg IV x1 -We will start amiodarone bolus with infusion in an effort to restore normal sinus rhythm -Given hypotension will need to give bolus over 30 minutes, - may need to wean down on the dose or wean off the diltiazem infusion given hypotension -Echo pending.  May want to wait until rate improves  2.  Hip surgery postop day 2 Followed by orthopedics  3.  Elevated troponin Unable to exclude non-STEMI, would repeat On heparin per hospitalist service -Likely demand ischemia in the setting of atrial fibrillation with RVR rate up to 160 We will need to determine if ischemic work-up needs to be performed prior to discharge  4.  Delirium/dementia Daughter reports waxing waning mental status and confusion  The above was discussed with patient's daughter at the bedside in detail Discussed with nursing at the bedside  Total encounter time more than 110 minutes  Greater than 50% was spent in counseling and coordination of care with the patient    For questions or updates, please contact [facility] Please consult [email or web address] for contact info under    Signed, Julien Nordmannimothy Anjalina Bergevin, MD  03/12/2020 7:24 PM

## 2020-03-12 NOTE — Evaluation (Signed)
Occupational Therapy Evaluation Patient Details Name: Michelle Bauer MRN: [id number] DOB: 27-May-1941 Today's Date: 03/12/2020    History of Present Illness Pt is a 79 y.o. female with medical history significant of 79 year old female with past medical history of hypertension, hyperlipidemia, anxiety/depression, severe scoliosis, diverticular bleed presented status post fall.  Patient was on her usual health and her daughter was coming to visit her.  She went to unlock to double lock since her daughter did not have the key to the door and she reports a chair was pulled out in the kitchen and somehow she stumbled over it and fell.  She did hit her head against a wall.  Daughter arrived 30 minutes later meanwhile patient was on the floor.  Pt initially refused to come to the ER, but could not walk.  Found to have R hip fx and is now s/p ORIF 9/22.   Clinical Impression   Michelle Bauer was seen for OT evaluation this date POD#2 from above surgery. Prior to hospital admission, pt was Independent c ADLs and mobility, daughters assisting for meals/IADLs. Pt lives alone c daughters available PRN. Pt presents to acute OT demonstrating impaired ADL performance and functional mobility 2/2 decreased activity tolerance, functional strength/balance deficits, decreased LB access, and anxiousness. Pt received sitting on BSC c NT/RN completing toileting/bathing. Pt currently requires MAX A don B socks and bathing seated on BSC. Attempted to rise from [facility] with +1 however pt would not relax grip on armrests and unable to achieve upright posture c MAX A. Per DTR, pt eaily becomes anxious/fearful - DTR attempted to engage/encourage pt who appeared increasingly lethargic. Ultimately required TOTAL A x2 for SPT BSC>chair. Pt would benefit from skilled OT to address noted impairments and functional limitations (see below for any additional details) in order to maximize safety and independence while minimizing falls risk and caregiver  burden. Upon hospital discharge, recommend STR to maximize pt safety and return to PLOF.     Follow Up Recommendations  SNF    Equipment Recommendations  Other (comment) (TBD)    Recommendations for Other Services       Precautions / Restrictions Precautions Precautions: Fall Restrictions Weight Bearing Restrictions: Yes RLE Weight Bearing: Weight bearing as tolerated      Mobility Bed Mobility Overal bed mobility: Needs Assistance     General bed mobility comments: Pt received on BSC and left up in chair   Transfers Overall transfer level: Needs assistance Equipment used: 2 person hand held assist Transfers: Stand Pivot Transfers;Sit to/from Stand Sit to Stand: Max assist;+2 physical assistance Stand pivot transfers: Total assist;+2 physical assistance    General transfer comment: Attempted to rise from [facility] with +1 however pt would not relax grip on armrests and unable to achieve upright posture c MAX A. DTR attempted to engage and encourage pt who appeared lethargic, ultimately required TOTAL A x2 for SPT BSC>chair.     Balance Overall balance assessment: Needs assistance Sitting-balance support: Single extremity supported;Feet supported Sitting balance-Leahy Scale: Fair   Standing balance support: Bilateral upper extremity supported Standing balance-Leahy Scale: Poor      ADL either performed or assessed with clinical judgement   ADL Overall ADL's : Needs assistance/impaired    General ADL Comments: MAX A don B socks seated on BSC. MAX A bathing seated on BSC. MAX A x2 for ADL t/f                   Pertinent Vitals/Pain Pain Assessment: No/denies pain  Pain Location: Recently received pain medicine         Extremity/Trunk Assessment Upper Extremity Assessment Upper Extremity Assessment: Generalized weakness   Lower Extremity Assessment Lower Extremity Assessment: Generalized weakness       Communication Communication Communication: HOH    Cognition Arousal/Alertness: Awake/alert;Lethargic Behavior During Therapy: Anxious Overall Cognitive Status: Impaired/Different from baseline      General Comments: Pt with severe delirium POD1 (9/23), much better at time of exam (9/24) though still not at her baseline per DTR   General Comments  Pt needing extra reinforcement, encouragement and cuing to do most acts    Exercises Exercises: Other exercises Other Exercises Other Exercises: Pt educated re: OT role, DME recs, d/c recs, ECS, falls prevention, importance of mobility for funcitonal strenghtening Other Exercises: LBD, bathing, toileting, sit<>stand, SPT, sitting/standing balance/tolerance        Home Living Family/patient expects to be discharged to:: Skilled nursing facility Living Arrangements: Alone        Additional Comments: daughter's check on her daily, she has access to a walker but gets around in the home w/o AD. Has shower seat      Prior Functioning/Environment Level of Independence: Independent        Comments: daughter' s do errands, groceries, help some with bathing, meals - pt rarely out of the home has had increasing falls recently with ~ 1 fall/wk (?)        OT Problem List: Decreased strength;Decreased range of motion;Decreased activity tolerance;Impaired balance (sitting and/or standing);Decreased coordination;Decreased cognition;Decreased safety awareness;Decreased knowledge of use of DME or AE      OT Treatment/Interventions: Self-care/ADL training;Therapeutic exercise;Energy conservation;DME and/or AE instruction;Therapeutic activities;Patient/family education;Balance training;Cognitive remediation/compensation    OT Goals(Current goals can be found in the care plan section) Acute Rehab OT Goals Patient Stated Goal: go home OT Goal Formulation: With patient Time For Goal Achievement: 03/26/20 Potential to Achieve Goals: Fair ADL Goals Pt Will Perform Grooming: with modified  independence;sitting Pt Will Perform Lower Body Dressing: with mod assist;sitting/lateral leans (c LRAD PRN) Pt Will Transfer to Toilet: with mod assist;stand pivot transfer;bedside commode (c LRAD PRN)  OT Frequency: Min 2X/week   Barriers to D/C: Inaccessible home environment;Decreased caregiver support             AM-PAC OT "6 Clicks" Daily Activity     Outcome Measure Help from another person eating meals?: A Little Help from another person taking care of personal grooming?: A Little Help from another person toileting, which includes using toliet, bedpan, or urinal?: A Lot Help from another person bathing (including washing, rinsing, drying)?: A Lot Help from another person to put on and taking off regular upper body clothing?: A Little Help from another person to put on and taking off regular lower body clothing?: A Lot 6 Click Score: 15   End of Session Nurse Communication: Mobility status  Activity Tolerance: Patient limited by fatigue Patient left: in chair;with call bell/phone within reach;with chair alarm set;with family/visitor present  OT Visit Diagnosis: Other abnormalities of gait and mobility (R26.89);History of falling (Z91.81)                Time: 8119-1478 OT Time Calculation (min): 26 min Charges:  OT General Charges $OT Visit: 1 Visit OT Evaluation $OT Eval Low Complexity: 1 Low OT Treatments $Self Care/Home Management : 8-22 mins $Therapeutic Activity: 8-22 mins  Kathie Dike, M.S. OTR/L  03/12/20, 2:04 PM  ascom [phone]

## 2020-03-12 NOTE — Consult Note (Signed)
ANTICOAGULATION CONSULT NOTE - Initial Consult  Pharmacy Consult for heparin dosing  Indication: atrial fibrillation  Allergies  Allergen Reactions  . Other Other (See Comments)  . Penicillins Other (See Comments)    Has patient had a PCN reaction causing immediate rash, facial/tongue/throat swelling, SOB or lightheadedness with hypotension: Unknown Has patient had a PCN reaction causing severe rash involving mucus membranes or skin necrosis: Unknown Has patient had a PCN reaction that required hospitalization: Unknown Has patient had a PCN reaction occurring within the last 10 years: No If all of the above answers are "NO", then may proceed with Cephalosporin use.  . Sulfa Antibiotics Other (See Comments)    Patient Measurements: Height: 5\' 5"  (165.1 cm) Weight: 56 kg (123 lb 7.3 oz) IBW/kg (Calculated) : 57 Heparin Dosing Weight: 56kg  Vital Signs: Temp: 97.9 F (36.6 C) (09/24 1645) Temp Source: Oral (09/24 1645) BP: 118/95 (09/24 1645) Pulse Rate: 119 (09/24 1645)  Labs: Recent Labs    03/09/20 1823 03/10/20 0412 03/10/20 0412 03/11/20 0816 03/12/20 0607  HGB  --  10.1*   < > 8.4* 7.2*  HCT  --  30.1*  --  26.3* 22.7*  PLT  --  223  --  204 198  APTT 32  --   --   --   --   LABPROT 14.2  --   --   --   --   INR 1.1  --   --   --   --   CREATININE  --  0.92  --  0.93 0.62   < > = values in this interval not displayed.    Estimated Creatinine Clearance: 52.1 mL/min (by C-G formula based on SCr of 0.62 mg/dL).   Medical History: Past Medical History:  Diagnosis Date  . Anxiety   . Depression   . Essential hypertension   . Hyperlipidemia     Assessment: Patient being started on heparin infusion for afib.  Pt receiving enoxaprin 40mg  for DVT prophylaxis. She received last dose of enoxaprin @1044  today.  Given Hbg of 7.2, discussed with provider whether bolus was needed or indicated. Per discussion with Dr. 03/14/20, orders to hold initial bolus dose.   Goal  of Therapy:  Heparin level 0.3-0.7 units/ml   Plan:  Start heparin infusion at 750 units/hr  Will check anti Xa level 8hrs after start of infusion (0130). Will check CBC with AM labs and monitor plts and Hgb closely.  03/12/2020,5:01 PM

## 2020-03-12 NOTE — NC FL2 (Signed)
[place] MEDICAID FL2 LEVEL OF CARE SCREENING TOOL     IDENTIFICATION  Patient Name: Michelle Bauer Birthdate: May 18, 1941 Sex: female Admission Date (Current Location): 03/09/2020  [facility], [place] and [place] Number:  Chiropodist and Address:  [facility], [address] [place], [place] [address]      Provider Number: [id number]  Attending Physician Name and Address:  Merlene Laughter, DO  Relative Name and Phone Number:  Wendi Maya  [phone]    Current Level of Care: Hospital Recommended Level of Care: Skilled Nursing Facility Prior Approval Number:    Date Approved/Denied:   PASRR Number:    Discharge Plan: SNF    Current Diagnoses: Patient Active Problem List   Diagnosis Date Noted   Acute delirium 03/11/2020   Leukocytosis 03/11/2020   Fracture 03/09/2020   Essential hypertension 03/09/2020   Hypokalemia 03/09/2020   Hematochezia    Rectal bleeding    GI bleed 03/05/2018   Encounter for long-term (current) use of other medications 09/25/2017   Chronic diarrhea 09/11/2017   CAD in native artery 11/08/2015   Other long term (current) drug therapy 11/08/2015   Elevated fasting blood sugar 11/08/2015   Generalized anxiety disorder 12/07/2014   Back ache 02/14/2014   Scoliosis 05/27/2012   BP (high blood pressure) 11/20/2011   HLD (hyperlipidemia) 11/20/2011    Orientation RESPIRATION BLADDER Height & Weight     Self  Normal Continent Weight: 56 kg Height:  5\' 5"  (165.1 cm)  BEHAVIORAL SYMPTOMS/MOOD NEUROLOGICAL BOWEL NUTRITION STATUS      Continent Diet (Regular)  AMBULATORY STATUS COMMUNICATION OF NEEDS Skin   Extensive Assist Verbally Normal                       Personal Care Assistance Level of Assistance  Dressing, Feeding, Bathing Bathing Assistance: Maximum assistance Feeding assistance: Limited assistance Dressing Assistance: Maximum assistance     Functional Limitations  Info  Speech, Hearing, Sight Sight Info: Adequate Hearing Info: Adequate Speech Info: Adequate    SPECIAL CARE FACTORS FREQUENCY  PT (By licensed PT), OT (By licensed OT)                    Contractures Contractures Info: Not present    Additional Factors Info  Code Status, Allergies Code Status Info: DNR Allergies Info: Penicillin, Sulfa           Current Medications (03/12/2020):  This is the current hospital active medication list Current Facility-Administered Medications  Medication Dose Route Frequency Provider Last Rate Last Admin   acetaminophen (TYLENOL) tablet 325-650 mg  325-650 mg Oral Q6H PRN 03/14/2020, MD       alum & mag hydroxide-simeth (MAALOX/MYLANTA) 200-200-20 MG/5ML suspension 30 mL  30 mL Oral Q4H PRN 12-26-2000, MD       atorvastatin (LIPITOR) tablet 40 mg  40 mg Oral Q supper Juanell Fairly, MD   40 mg at 03/11/20 1544   bisacodyl (DULCOLAX) EC tablet 5 mg  5 mg Oral Daily PRN 03/13/20, MD       bisacodyl (DULCOLAX) suppository 10 mg  10 mg Rectal Daily PRN Juanell Fairly, MD       Chlorhexidine Gluconate Cloth 2 % PADS 6 each  6 each Topical Q0600 Juanell Fairly, MD       clonazePAM Juanell Fairly) tablet 0.5 mg  0.5 mg Oral QID Scarlette Calico, MD   0.5 mg at 03/12/20 1400   docusate  sodium (COLACE) capsule 100 mg  100 mg Oral BID Juanell Fairly, MD   100 mg at 03/12/20 1043   enoxaparin (LOVENOX) injection 40 mg  40 mg Subcutaneous Q24H Juanell Fairly, MD   40 mg at 03/12/20 1044   escitalopram (LEXAPRO) tablet 5 mg  5 mg Oral Daily Marguerita Merles [place], DO   5 mg at 03/12/20 1041   ferrous sulfate tablet 325 mg  325 mg Oral TID PC Juanell Fairly, MD   325 mg at 03/12/20 1400   haloperidol lactate (HALDOL) injection 2.5 mg  2.5 mg Intravenous Q6H PRN Manuela Schwartz, NP       hydrALAZINE (APRESOLINE) injection 10 mg  10 mg Intravenous Q6H PRN Juanell Fairly, MD       HYDROcodone-acetaminophen  (NORCO/VICODIN) 5-325 MG per tablet 1 tablet  1 tablet Oral Q4H PRN Juanell Fairly, MD       magnesium citrate solution 1 Bottle  1 Bottle Oral Once PRN Juanell Fairly, MD       menthol-cetylpyridinium (CEPACOL) lozenge 3 mg  1 lozenge Oral PRN Juanell Fairly, MD       Or   phenol (CHLORASEPTIC) mouth spray 1 spray  1 spray Mouth/Throat PRN Juanell Fairly, MD       methocarbamol (ROBAXIN) tablet 500 mg  500 mg Oral Q6H PRN Juanell Fairly, MD       Or   methocarbamol (ROBAXIN) 500 mg in dextrose 5 % 50 mL IVPB  500 mg Intravenous Q6H PRN Juanell Fairly, MD       morphine 2 MG/ML injection 0.5-1 mg  0.5-1 mg Intravenous Q2H PRN Juanell Fairly, MD       multivitamin with minerals tablet 1 tablet  1 tablet Oral Daily Juanell Fairly, MD   1 tablet at 03/12/20 1042   mupirocin ointment (BACTROBAN) 2 % 1 application  1 application Nasal BID Juanell Fairly, MD   1 application at 03/12/20 1200   omega-3 acid ethyl esters (LOVAZA) capsule 1 g  1 g Oral Q lunch Juanell Fairly, MD   1 g at 03/12/20 1400   ondansetron (ZOFRAN) tablet 4 mg  4 mg Oral Q6H PRN Juanell Fairly, MD       Or   ondansetron [facility]) injection 4 mg  4 mg Intravenous Q6H PRN Juanell Fairly, MD   4 mg at 03/12/20 1038   polyethylene glycol (MIRALAX / GLYCOLAX) packet 17 g  17 g Oral Daily PRN Juanell Fairly, MD       potassium chloride SA (KLOR-CON) CR tablet 40 mEq  40 mEq Oral BID Sheikh, Omair Latif, DO   40 mEq at 03/12/20 1400   promethazine (PHENERGAN) injection 12.5 mg  12.5 mg Intravenous Q6H PRN Juanell Fairly, MD       psyllium (HYDROCIL/METAMUCIL) 1 packet  1 packet Oral Daily Juanell Fairly, MD   1 packet at 03/12/20 1402   senna (SENOKOT) tablet 8.6 mg  1 tablet Oral BID Juanell Fairly, MD   8.6 mg at 03/12/20 1042   senna-docusate (Senokot-S) tablet 1 tablet  1 tablet Oral QHS PRN Juanell Fairly, MD       traMADol Janean Sark) tablet 100 mg  100 mg Oral TID Juanell Fairly,  MD   100 mg at 03/12/20 1400     Discharge Medications: Please see discharge summary for a list of discharge medications.  Relevant Imaging Results:  Relevant Lab Results:   Additional Information [id number]  Trenton Founds, RN

## 2020-03-13 ENCOUNTER — Inpatient Hospital Stay (HOSPITAL_COMMUNITY)
Admit: 2020-03-13 | Discharge: 2020-03-13 | Disposition: A | Discharge status: Home / Self Care | Payer: Medicare Other | Attending: Cardiovascular Disease | Admitting: Cardiovascular Disease

## 2020-03-13 ENCOUNTER — Inpatient Hospital Stay: Payer: Medicare Other

## 2020-03-13 DIAGNOSIS — R778 Other specified abnormalities of plasma proteins: Secondary | ICD-10-CM

## 2020-03-13 DIAGNOSIS — J9601 Acute respiratory failure with hypoxia: Secondary | ICD-10-CM

## 2020-03-13 DIAGNOSIS — I509 Heart failure, unspecified: Secondary | ICD-10-CM

## 2020-03-13 DIAGNOSIS — I248 Other forms of acute ischemic heart disease: Secondary | ICD-10-CM

## 2020-03-13 DIAGNOSIS — I340 Nonrheumatic mitral (valve) insufficiency: Secondary | ICD-10-CM

## 2020-03-13 DIAGNOSIS — I361 Nonrheumatic tricuspid (valve) insufficiency: Secondary | ICD-10-CM

## 2020-03-13 LAB — MAGNESIUM: Magnesium: 1.8 mg/dL (ref 1.7–2.4)

## 2020-03-13 LAB — TYPE AND SCREEN
ABO/RH(D): O POS
Antibody Screen: NEGATIVE
Unit division: 0

## 2020-03-13 LAB — CBC WITH DIFFERENTIAL/PLATELET
Abs Immature Granulocytes: 0.06 10*3/uL (ref 0.00–0.07)
Basophils Absolute: 0 10*3/uL (ref 0.0–0.1)
Basophils Relative: 0 %
Eosinophils Absolute: 0 10*3/uL (ref 0.0–0.5)
Eosinophils Relative: 0 %
HCT: 27.5 % — ABNORMAL LOW (ref 36.0–46.0)
Hemoglobin: 9.1 g/dL — ABNORMAL LOW (ref 12.0–15.0)
Immature Granulocytes: 1 %
Lymphocytes Relative: 21 %
Lymphs Abs: 1.7 10*3/uL (ref 0.7–4.0)
MCH: 28.6 pg (ref 26.0–34.0)
MCHC: 33.1 g/dL (ref 30.0–36.0)
MCV: 86.5 fL (ref 80.0–100.0)
Monocytes Absolute: 0.7 10*3/uL (ref 0.1–1.0)
Monocytes Relative: 8 %
Neutro Abs: 5.8 10*3/uL (ref 1.7–7.7)
Neutrophils Relative %: 70 %
Platelets: 232 10*3/uL (ref 150–400)
RBC: 3.18 MIL/uL — ABNORMAL LOW (ref 3.87–5.11)
RDW: 13.7 % (ref 11.5–15.5)
WBC: 8.3 10*3/uL (ref 4.0–10.5)
nRBC: 0 % (ref 0.0–0.2)

## 2020-03-13 LAB — BPAM RBC
Blood Product Expiration Date: 202110262359
ISSUE DATE / TIME: 202109242310
Unit Type and Rh: 5100

## 2020-03-13 LAB — COMPREHENSIVE METABOLIC PANEL
ALT: 15 U/L (ref 0–44)
AST: 34 U/L (ref 15–41)
Albumin: 2.2 g/dL — ABNORMAL LOW (ref 3.5–5.0)
Alkaline Phosphatase: 57 U/L (ref 38–126)
Anion gap: 6 (ref 5–15)
BUN: 23 mg/dL (ref 8–23)
CO2: 27 mmol/L (ref 22–32)
Calcium: 7.9 mg/dL — ABNORMAL LOW (ref 8.9–10.3)
Chloride: 102 mmol/L (ref 98–111)
Creatinine, Ser: 0.7 mg/dL (ref 0.44–1.00)
GFR calc Af Amer: >60 mL/min (ref >60)
GFR calc non Af Amer: >60 mL/min (ref >60)
Glucose, Bld: 110 mg/dL — ABNORMAL HIGH (ref 70–99)
Potassium: 4.3 mmol/L (ref 3.5–5.1)
Sodium: 135 mmol/L (ref 135–145)
Total Bilirubin: 0.8 mg/dL (ref 0.3–1.2)
Total Protein: 5.6 g/dL — ABNORMAL LOW (ref 6.5–8.1)

## 2020-03-13 LAB — LIPID PANEL
Cholesterol: 98 mg/dL (ref 0–200)
HDL: 33 mg/dL — ABNORMAL LOW (ref >40)
LDL Cholesterol: 48 mg/dL (ref 0–99)
Total CHOL/HDL Ratio: 3 RATIO
Triglycerides: 84 mg/dL (ref <150)
VLDL: 17 mg/dL (ref 0–40)

## 2020-03-13 LAB — HEPARIN LEVEL (UNFRACTIONATED)
Heparin Unfractionated: 0.19 IU/mL — ABNORMAL LOW (ref 0.30–0.70)
Heparin Unfractionated: 0.16 IU/mL — ABNORMAL LOW (ref 0.30–0.70)
Heparin Unfractionated: 0.42 IU/mL (ref 0.30–0.70)

## 2020-03-13 LAB — TROPONIN I (HIGH SENSITIVITY)
Troponin I (High Sensitivity): 1093 ng/L (ref <18)
Troponin I (High Sensitivity): 827 ng/L (ref <18)

## 2020-03-13 LAB — PHOSPHORUS: Phosphorus: 3 mg/dL (ref 2.5–4.6)

## 2020-03-13 LAB — BRAIN NATRIURETIC PEPTIDE: B Natriuretic Peptide: 477.4 pg/mL — ABNORMAL HIGH (ref 0.0–100.0)

## 2020-03-13 MED ORDER — HEPARIN BOLUS VIA INFUSION
1600.0000 [IU] | Freq: Once | INTRAVENOUS | Status: AC
  Administered 2020-03-13: 1600 [IU] via INTRAVENOUS
  Filled 2020-03-13: qty 1600

## 2020-03-13 MED ORDER — VITAMIN B-12 1000 MCG PO TABS
1000.0000 ug | ORAL_TABLET | Freq: Every day | ORAL | Status: DC
  Administered 2020-03-13 – 2020-03-17 (×5): 1000 ug via ORAL
  Filled 2020-03-13 (×5): qty 1

## 2020-03-13 MED ORDER — METOPROLOL TARTRATE 25 MG PO TABS
12.5000 mg | ORAL_TABLET | Freq: Two times a day (BID) | ORAL | Status: DC
  Administered 2020-03-13 – 2020-03-17 (×9): 12.5 mg via ORAL
  Filled 2020-03-13 (×9): qty 1

## 2020-03-13 MED ORDER — MAGNESIUM SULFATE IN D5W 1-5 GM/100ML-% IV SOLN
1.0000 g | Freq: Once | INTRAVENOUS | Status: AC
  Administered 2020-03-13: 1 g via INTRAVENOUS
  Filled 2020-03-13: qty 100

## 2020-03-13 MED ORDER — ASPIRIN 81 MG PO CHEW
81.0000 mg | CHEWABLE_TABLET | Freq: Every day | ORAL | Status: DC
  Administered 2020-03-13 – 2020-03-17 (×5): 81 mg via ORAL
  Filled 2020-03-13 (×5): qty 1

## 2020-03-13 MED ORDER — FUROSEMIDE 10 MG/ML IJ SOLN
20.0000 mg | Freq: Once | INTRAMUSCULAR | Status: AC
  Administered 2020-03-13: 20 mg via INTRAVENOUS
  Filled 2020-03-13: qty 2

## 2020-03-13 MED ORDER — AMIODARONE HCL 200 MG PO TABS
200.0000 mg | ORAL_TABLET | Freq: Two times a day (BID) | ORAL | Status: DC
  Administered 2020-03-13 – 2020-03-17 (×9): 200 mg via ORAL
  Filled 2020-03-13 (×9): qty 1

## 2020-03-13 NOTE — Plan of Care (Signed)
  Problem: Pain Management: Goal: Pain level will decrease Outcome: Progressing   Problem: Cardiac: Goal: Ability to achieve and maintain adequate cardiopulmonary perfusion will improve Outcome: Progressing   Problem: Health Behavior/Discharge Planning: Goal: Ability to safely manage health-related needs after discharge will improve Outcome: Progressing

## 2020-03-13 NOTE — Consult Note (Signed)
ANTICOAGULATION CONSULT NOTE  Pharmacy Consult for heparin dosing  Indication: atrial fibrillation  Patient Measurements: Height: 5\' 1"  (154.9 cm) Weight:  (Low bed) IBW/kg (Calculated) : 47.8 Heparin Dosing Weight: 56kg  Labs: Recent Labs    03/11/20 0816 03/11/20 0816 03/12/20 0607 03/12/20 1804 03/13/20 0257 03/13/20 1022 03/13/20 1214 03/13/20 1301 03/13/20 2159  HGB 8.4*   < > 7.2*  --  9.1*  --   --   --   --   HCT 26.3*  --  22.7*  --  27.5*  --   --   --   --   PLT 204  --  198  --  232  --   --   --   --   APTT  --   --   --  37*  --   --   --   --   --   LABPROT  --   --   --  14.6  --   --   --   --   --   INR  --   --   --  1.2  --   --   --   --   --   HEPARINUNFRC  --   --   --   --  0.19*  --   --  0.16* 0.42  CREATININE 0.93  --  0.62  --  0.70  --   --   --   --   TROPONINIHS  --   --   --  1,523*  --  1,093* 827*  --   --    < > = values in this interval not displayed.    Estimated Creatinine Clearance: 44.4 mL/min (by C-G formula based on SCr of 0.7 mg/dL).  Assessment: Patient being started on heparin infusion for afib.  Pt receiving enoxaprin 40mg  for DVT prophylaxis. She received last dose of enoxaprin @1044  today.  Given Hbg of 7.2, discussed with provider whether bolus was needed or indicated. Per discussion with Dr. 2160, orders to hold initial bolus dose.   9/25 0257 HL 0.19  9/25 1301 HL 0.16  9/25 2159 HL 0.42  Goal of Therapy:  Heparin level 0.3-0.7 units/ml   Plan:  Heparin level is therapeutic x 1. Will continue heparin infusion rate at 1100 unit/hr. Recheck heparin level in 8 hours. CBC daily while on heparin.   10/25 03/13/2020,10:41 PM

## 2020-03-13 NOTE — Progress Notes (Signed)
PROGRESS NOTE    Michelle Bauer  BZJ:[id number] DOB: Mar 20, 1941 DOA: 03/09/2020 PCP: [name], MD   Brief Narrative:  HPI per Dr. [name] on 03/09/20 Michelle Bauer is a 79 y.o. female with medical history significant of 79 year old female with past medical history of hypertension, hyperlipidemia, anxiety/depression, severe scoliosis, diverticular bleed presented status post mechanical fall which occurred yesterday.  Patient was on her usual health and her daughter was coming to visit her.  She went to unlock to double lock since her daughter did not have the key to the door and she reports a chair was pulled out in the kitchen and somehow she stumbled over it and fell.  She did hit her head against a wall.  Daughter arrived 30 minutes later meanwhile patient was on the floor.  This occurred yesterday and patient refused to come to the ER.  Daughter realized patient is unable to walk with a call patient's PCP and PCP recommended them to call EMS. She reports pain of rt hip as she points to it. Denies any other pain.  She denies headache, vision change, chest pain or shortness of breath.  Daughter thinks she is starting to have some dementia signs but has not been evaluated.  Patient reports chronic diarrhea however now is more formed and soft with Metamucil as she takes it every day.  Patient is Covid negative.  However she has not had her vaccination yet.  Daughters have been vaccinated.  ED Course: Blood pressure 137/78 afebrile, on room air 97%  Review of Systems: All systems reviewed and otherwise negative.  Potassium 3.3, creatinine 1.05, glucose 135 BUN 24 WBC 8.7 hemoglobin 9.4 hematocrit 29.6 MCV 87.6  Hip x-ray revealed acute displaced intertrochanteric fracture of the proximal right femur  Chest x-ray personally reviewed no acute cardiopulmonary disease EKG personally reviewed sinus rhythm no ischemic ST changes  **Interim History Patient is to underwent  operative procedure 03/10/20 for the patient's hip fracture postoperatively she became confused and agitated and likely had delirium in the setting of her anesthesia.  She is much more awake alert and oriented today.  Also her hospital course has been complicated by her hemoglobin dropping as well as having a vitamin B12 deficiency.  Maury Dus she also had a hypokalemia which will be replete.  PT OT evaluated and recommending SNF and and orthopedic surgery recommending weightbearing as tolerated.  03/13/20 Yesterday afternoon she went into Atrial fibrillation with RVR and was transferred to the cardiac progressive care unit.  She was given a dose of IV metoprolol 5 mg x 1 and started on a diltiazem bolus and drip which was ineffective so cardiology changed her to amiodarone.  She was also given 1 unit of PRBCs given her low hemoglobin as she is will be anticoagulated and she is now on a heparin drip with no bolus.  Chest x-ray yesterday showed "Findings consistent with congestive heart failure, with worsening vascular congestion, left basilar consolidation, and new left pleural effusion."  IV Lasix yesterday and will be given another dose of IV 20 mg today.  Echocardiogram is still pending and BNP was elevated at 477.4.  Her troponins were also elevated yesterday and cardiology is evaluating and awaiting echocardiogram.  She converted to normal sinus rhythm overnight and is stable today.  Troponins elevated at 1523 and now trending down and could be demand ischemia in the setting of rapid A. fib however echocardiogram is pending and she denies any chest pain.  Cardiology has  changed the patient from a heparin drip to Eliquis and have also added a low-dose beta-blocker.   Assessment & Plan:   Active Problems:   Generalized anxiety disorder   HLD (hyperlipidemia)   Fracture   Essential hypertension   Hypokalemia   Acute delirium   Leukocytosis  Acute Right Proximal Femur Fracture in the setting of  Mechanical Fall status post intramedullary fixation of the right intertrochanteric hip fracture postoperative day 3 -Admitted to inpatient MedSurg  -Hip X-Ray showed "Acute displaced intratrochanteric fracture of the proximal right femur." -ED spoke to Dr. [name] of Orthopedics and plan is for Surgical intervention today (scheduled for 1 pm) -Continue with pain management and was given half a milligram of Dilaudid once in the ED.  Continue with hydrocodone-acetaminophen 1-2 tabs p.o. every 6 hours as needed moderate pain, IV morphine 0.5 mg every 2 as needed severe pain -Continue with supportive care and antiemetics with IV Zofran 4 mg every 6 hours as needed nausea or vomiting -She was n.p.o. after midnight for the procedure and is now on a regular diet -Bowel regimen initiated and she is on psyllium 1 packet p.o. daily, senna docusate 1 tab p.o. nightly as needed for mild constipation, as well as bisacodyl 5 mg p.o. daily as needed for moderate constipation adjust as necessary -IV fluid hydration has now been stopped given that she has been volume overloaded and will need acute heart failure, unknown type currently but will be obtaining echocardiogram.  She has received 2 doses of IV Lasix now -Head CT obtained since she did fall and showed age indeterminate small vessel ischemic changes in the white matter which was favored to be chronic and no acute intra cranial trauma noted. -Deferring pharmacological VTE prophylaxis to orthopedic surgery and she will be started on Lovenox today; currently on SCDs and will -PT OT evaluating but formal note is not put in yet so do not know other recommendations -Dr. [name] recommending it when she is less confused and recommends weightbearing as tolerated in the right lower extremity -Discharge planning either to SNF versus home with home health PT OT; likely will be a skilled nursing facility -Dr. [name] evaluated and there is no evidence of increased  surgical site bleeding at this time.  Acute Post-Operative Delirium, improved -Postoperatively and likely secondary to anesthesia -Became extremely agitated last night and pulled out both her IVs and had to be placed in soft mitten restraints -Haloperidol 2.5 mg IV every 6 as needed agitation -Reorient frequently and will place on delirium precautions -If not improving will consider head MRI given that she just recently had a head CT scan and will do further work-up with a TSH, RPR, vitamin B12 level -Delirium is now improved   Essential Hypertension -On hydrochlorothiazide PTA only which will be held -Blood pressure currently stable and since she is going to surgery will hold off and place her on IV -hydralazine as needed for systolic blood pressure more than 160 -Hold off on HCTZ  Also because of hypokalemia -She was placed on a Cardizem drip yesterday and given a 10 mg bolus however this was ineffective and she became slightly hypotensive so this was stopped -Last BP reading was 158/69  Hyperlipidemia -LFTs normal on admission with an AST of 28 and ALT of 12; now AST is 34, ALT is 15 -Continue home Atorvastatin 40 mg po qHS and Omega-3 Acid Ethyl Esters 1 gram po Daily with Lunch  Leukocytosis, improved -Mild likely reactive and is improved  -  Patient WBC trended up to 11.5 and is now 8.3; if continues to worsen we will panculture -Likely in the setting of her surgery -Continue to monitor for signs and symptoms of infection -Repeat CBC in a.m.  Hypokalemia  -K+ on Admission was 3.3 and yesterday it was 3.2 and repeat this AM was 4.3 -Continue to Monitor and Replete as Necessary -Repeat BMP in the AM   Depression/Anxiety -C/w Escitalopram 5 mg po BID -Pharmacy has verified the patient takes clonazepam 0.5 mg 4 times daily and this will be resumed  Acute Blood Loss Anemia on Chronic Normocytic Anemia -Patient's Hgb/Hct went from 9.4/29.6 -> 10.1/30.1 and dropped to 8.4/26.3  after surgery and yesterday was 7.2/22.7; she was typed and screened and given 1 unit of pRBC's and now Hgb/Hct is stable at 9.1/27.5 -? If Further Drop could have been dilutional given that she was receiving IVF with NS at 75 mL/hr but IVF now stopped  -Anemia panel was checked and showed an iron level of 32, U IBC 150, TIBC 182, saturation ratios of 18%, ferritin level 194, folate level of 8.2 vitamin B12 level was 128 -Continue to Monitor for S/Sx of Bleeding closely now that she is on Heparin gtt; Currently no overt bleeding noted -She was also started on Enoxaparin 40 mg sq q24h -Orthopedic Surgery started the patient on ferrous sulfate 325 mg p.o. 3 times daily  -As expected she did have a drop some post-operatively  -Repeat CBC in the AM  Vitamin B12 Deficiency -Patient's vitamin B12 level was checked and was 128 and low -Given cyanocobalamin 1000 mcg intramuscular injection once yesterday and will start p.o. supplementation this AM  Hyperglycemia  -Paitent's Blood Sugar on Admission was 135 and repeat was 125 -Check HbA1c was 5.3 -Continue to Monitor Blood Sugars carefully and if necessary will place on Sensitive Novolog SSI  New Onset A Fib with RVR -Transferred to PCU -Started Cardizem Drip and Bolus but was ineffective so Cardiology changed her to an Amiodarone gtt and bolus. Converted to NSR so Cardiology has changed the patient to p.o. Amiodarone twice daily -Start Heparin gtt with no bolus given Hgb of 7.2 and now is improved to 9.1; heparin drip is being continued but this is likely going to be changed to Eliquis per cardiology  -EKG confirmed with Rates in the 160's -Check CXR, Troponins, and ECHOCardiogram -Cardiology Consulted for further evaluation and Recommendations  Acquired Thrombophilia -In the Setting of Atrial Fibrillation -CHA2DS2-VASc at least a 4-6 for Age, Sex, and HTN but now likely even more elevated given her history of heart failure -Anticoagulating with  Heparin as above this is being changed to p.o. Eliquis by cardiology  Acute Respiratory Failure with Hypoxia -In the setting of acute CHF, unknown type -SpO2: 98 % O2 Flow Rate (L/min): 2 L/min -Continue supplemental oxygen via nasal cannula and wean O2 as tolerated Continuous pulse oximetry and maintain O2 saturations greater than 92% We will need an ambulatory home O2 screen prior to discharge -If necessary will start the patient on breathing treatments  Acute CHF, unknown type -Unknown type at this time his echo still pending -In the setting of hypotension, anemia, and volume repletion -BNP was elevated at 477.4 -Chest x-ray yesterday showed "Findings consistent with congestive heart failure, with worsening vascular congestion, left basilar consolidation, and new left pleural effusion." -Chest x-ray today showed "Cardiac shadow is enlarged but stable. Postsurgical changes are again seen. Stable left-sided pleural effusion and likely underlying atelectasis is present. Significant scoliosis  is seen. Mild residual vascular congestion remains." -Strict I's and O's, daily weights X-patient was given a dose of IV Lasix 20 mg yesterday -Will receive another dose of IV Lasix 20 mg today -Continue to monitor for signs and symptoms of volume overload  Elevated Troponin -Possibly demand ischemia but activity troponin trended up to 1523 and is now trending down with the last check being 827 -Denies any chest pain -Echocardiogram is ordered and pending to be done -Initially cardiology added aspirin 81 mg p.o. daily and also added low-dose beta-blocker with Toprol tartrate 12.5 mg p.o. twice daily -Currently on anticoagulation with a heparin drip for likely will be changed to Eliquis per cardiology   DVT prophylaxis: SCDs; Heparin gtt for now Code Status: DO NOT RESUSCITATE  Family Communication: Spoke to daughter at bedside Disposition Plan: Pending Ortho clearance as well as evaluation by PT  and OT; Now needs Cardiac Clearance as well  Status is: Inpatient  Remains inpatient appropriate because:Unsafe d/c plan, IV treatments appropriate due to intensity of illness or inability to take PO and Inpatient level of care appropriate due to severity of illness   Dispo: The patient is from: Home              Anticipated d/c is to: SNF vs Home with Home Health PT/OT              Anticipated d/c date is: 1-2 days              Patient currently is not medically stable to d/c.  Consultants:   Orthopedic Surgery Dr. Kevin Krasinski   Cardiology Dr. NeJuanell Fairlywman NickelsGollan/Dr. Ross  Procedures: INTRAMEDULLARY FIXATION OF RIGHT INTERTROCHANTERIC HIP FRACTURE POD 3 by Dr. Rico AlaKransinski  Antimicrobials:  Anti-infectives (From admission, onward)   Start     Dose/Rate Route Frequency Ordered Stop   03/10/20 2000  ceFAZolin (ANCEF) IVPB 2g/100 mL premix        2 g 200 mL/hr over 30 Minutes Intravenous Every 6 hours 03/10/20 1657 03/11/20 0641   03/10/20 1431  gentamicin 80 mg in 0.9% sodium chloride 250 mL irrigation  Status:  Discontinued          As needed 03/10/20 1431 03/10/20 1502   03/10/20 1300  ceFAZolin (ANCEF) IVPB 2g/100 mL premix        2 g 200 mL/hr over 30 Minutes Intravenous 30 min pre-op 03/10/20 1220 03/10/20 1310   03/10/20 1250  ceFAZolin (ANCEF) 2-4 GM/100ML-% IVPB       Note to Pharmacy: Agnes Lawrenceobinson, Patricia  : cabinet override      03/10/20 1250 03/10/20 1332        Subjective: Seen and examined at bedside and is sitting up in the bed with no complaints.  Thinks her dyspnea is improved a little bit.  No nausea or vomiting.  Denies any chest pain,.  States that her leg hurts whenever she tries to move it.  No other concerns or complaints at this time.  Objective: Vitals:   03/13/20 0301 03/13/20 0343 03/13/20 0805 03/13/20 1229  BP: (!) 142/60 (!) 146/60 (!) 144/60 (!) 158/69  Pulse: 66 65 70 75  Resp:   16 16  Temp:  97.8 F (36.6 C) 98.1 F (36.7 C) 98.1 F (36.7 C)    TempSrc:  Oral Oral Oral  SpO2: (!) 83% 97% 96% 98%  Weight:      Height:        Intake/Output Summary (Last 24 hours) at 03/13/2020 1333 Last  data filed at 03/13/2020 1244 Gross per 24 hour  Intake 816.49 ml  Output 700 ml  Net 116.49 ml   Filed Weights   03/09/20 1428 03/12/20 1823  Weight: 56 kg 54.4 kg   Examination: Physical Exam:  Constitutional: Patient is a thin elderly Caucasian female currently in no acute distress and she is awake alert today and states that her dyspnea is improved., NAD and appears calm and comfortable Eyes: Lids and conjunctivae normal, sclerae anicteric  ENMT: External Ears, Nose appear normal. Grossly normal hearing. Neck: Appears normal, supple, no cervical masses, normal ROM, no appreciable thyromegaly: No JVD Respiratory: Diminished to auscultation bilaterally with coarse breath sounds and some crackles, no wheezing, rales, rhonchi or crackles. Normal respiratory effort and patient is not tachypenic. No accessory muscle use.  Wearing supplemental oxygen via nasal cannula Cardiovascular: RRR, no murmurs / rubs / gallops. S1 and S2 auscultated.  Trace extremity edema Abdomen: Soft, non-tender, non-distended.  Bowel sounds positive.  GU: Deferred. Musculoskeletal: No clubbing / cyanosis of digits/nails. No joint deformity upper and lower extremities.  Skin: No rashes, lesions, ulcers on limited skin evaluation. No induration; Warm and dry.  Neurologic: CN 2-12 grossly intact with no focal deficits. Romberg sign and cerebellar reflexes not assessed.  Psychiatric: Normal judgment and insight. Alert and oriented x 3. Normal mood and appropriate affect.   Data Reviewed: I have personally reviewed following labs and imaging studies  CBC: Recent Labs  Lab 03/09/20 1440 03/10/20 0412 03/11/20 0816 03/12/20 0607 03/13/20 0257  WBC 8.7 9.9 11.5* 7.4 8.3  NEUTROABS 6.8  --  9.5* 5.7 5.8  HGB 9.4* 10.1* 8.4* 7.2* 9.1*  HCT 29.6* 30.1* 26.3* 22.7*  27.5*  MCV 87.6 85.3 89.2 87.0 86.5  PLT 225 223 204 198 232   Basic Metabolic Panel: Recent Labs  Lab 03/09/20 1440 03/10/20 0412 03/11/20 0816 03/12/20 0607 03/13/20 0257  NA 137 135 136 136 135  K 3.3* 3.7 3.6 3.2* 4.3  CL 96* 99 99 102 102  CO2 GLUCOSE 135* 125* 111* 82 110*  BUN 24* 23 24* 20 23  CREATININE 1.05* 0.92 0.93 0.62 0.70  CALCIUM 8.6* 8.6* 8.3* 7.9* 7.9*  MG  --   --  1.8 2.1 1.8  PHOS  --   --  3.0 2.9 3.0   GFR: Estimated Creatinine Clearance: 44.4 mL/min (by C-G formula based on SCr of 0.7 mg/dL). Liver Function Tests: Recent Labs  Lab 03/09/20 1440 03/11/20 0816 03/12/20 0607 03/13/20 0257  AST 34  ALT ALKPHOS 72 53 47 57  BILITOT 0.6 0.7 0.7 0.8  PROT 7.2 6.3* 5.5* 5.6*  ALBUMIN 2.9* 2.6* 2.2* 2.2*   No results for input(s): LIPASE, AMYLASE in the last 168 hours. No results for input(s): AMMONIA in the last 168 hours. Coagulation Profile: Recent Labs  Lab 03/09/20 1823 03/12/20 1804  INR 1.1 1.2   Cardiac Enzymes: No results for input(s): CKTOTAL, CKMB, CKMBINDEX, TROPONINI in the last 168 hours. BNP (last 3 results) No results for input(s): PROBNP in the last 8760 hours. HbA1C: Recent Labs    03/11/20 0816  HGBA1C 5.3   CBG: No results for input(s): GLUCAP in the last 168 hours. Lipid Profile: No results for input(s): CHOL, HDL, LDLCALC, TRIG, CHOLHDL, LDLDIRECT in the last 72 hours. Thyroid Function Tests: No results for input(s): TSH, T4TOTAL, FREET4, T3FREE, THYROIDAB in the last 72 hours. Anemia Panel: Recent  Labs    03/11/20 0816  VITAMINB12 128*  FOLATE 8.2  FERRITIN 194  TIBC 182*  IRON 32  RETICCTPCT 2.9   Sepsis Labs: No results for input(s): PROCALCITON, LATICACIDVEN in the last 168 hours.  Recent Results (from the past 240 hour(s))  SARS Coronavirus 2 by RT PCR (hospital order, performed in [facility] hospital lab) Nasopharyngeal Nasopharyngeal Swab     Status: None     Collection Time: 03/09/20  2:31 PM   Specimen: Nasopharyngeal Swab  Result Value Ref Range Status   SARS Coronavirus 2 NEGATIVE NEGATIVE Final    Comment: (NOTE) SARS-CoV-2 target nucleic acids are NOT DETECTED.  The SARS-CoV-2 RNA is generally detectable in upper and lower respiratory specimens during the acute phase of infection. The lowest concentration of SARS-CoV-2 viral copies this assay can detect is 250 copies / mL. A negative result does not preclude SARS-CoV-2 infection and should not be used as the sole basis for treatment or other patient management decisions.  A negative result may occur with improper specimen collection / handling, submission of specimen other than nasopharyngeal swab, presence of viral mutation(s) within the areas targeted by this assay, and inadequate number of viral copies (<250 copies / mL). A negative result must be combined with clinical observations, patient history, and epidemiological information.  Fact Sheet for Patients:   BoilerBrush.com.cy  Fact Sheet for Healthcare Providers: https://pope.com/  This test is not yet approved or  cleared by the [place] FDA and has been authorized for detection and/or diagnosis of SARS-CoV-2 by FDA under an Emergency Use Authorization (EUA).  This EUA will remain in effect (meaning this test can be used) for the duration of the COVID-19 declaration under Section 564(b)(1) of the Act, 21 U.S.C. section 360bbb-3(b)(1), unless the authorization is terminated or revoked sooner.  Performed at [facility], [address], [place], [place] [address]   Surgical PCR screen     Status: Abnormal   Collection Time: 03/09/20  9:25 PM   Specimen: Nasal Mucosa; Nasal Swab  Result Value Ref Range Status   MRSA, PCR NEGATIVE NEGATIVE Final   Staphylococcus aureus POSITIVE (A) NEGATIVE Final    Comment: (NOTE) The Xpert SA Assay (FDA approved for  NASAL specimens in patients 67 years of age and older), is one component of a comprehensive surveillance program. It is not intended to diagnose infection nor to guide or monitor treatment. Performed at [facility], [address] [place], [place] [address]     RN Pressure Injury Documentation:     Estimated body mass index is 22.67 kg/m as calculated from the following:   Height as of this encounter:  (1.549 m).   Weight as of this encounter: 54.4 kg.  Malnutrition Type:      Malnutrition Characteristics:      Nutrition Interventions:    Radiology Studies: DG Chest 1 View  Result Date: 03/13/2020 CLINICAL DATA:  Hypoxia, recent hip fracture and repair EXAM: CHEST  1 VIEW COMPARISON:  03/12/2020 FINDINGS: Cardiac shadow is enlarged but stable. Postsurgical changes are again seen. Stable left-sided pleural effusion and likely underlying atelectasis is present. Significant scoliosis is seen. Mild residual vascular congestion remains. IMPRESSION: Overall stable appearance of the chest when compare with the previous exam. Electronically Signed   By: Alcide Clever M.D.   On: 03/13/2020 12:51   DG Chest 1 View  Result Date: 03/12/2020 CLINICAL DATA:  Hypoxia EXAM: CHEST  1 VIEW COMPARISON:  03/09/2020 FINDINGS: Single  frontal view of the chest demonstrates marked right convex scoliosis. The cardiac silhouette is enlarged. 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 Since the previous exam, left basilar consolidation and left pleural effusion have developed. There is increased central vascular congestion. No pneumothorax. IMPRESSION: 1. Findings consistent with congestive heart failure, with worsening vascular congestion, left basilar consolidation, and new left pleural effusion. Electronically Signed   By: Sharlet Salina M.D.   On: 03/12/2020 17:54   Scheduled Meds: . amiodarone  200 mg Oral BID  . aspirin  81 mg Oral Daily  . atorvastatin  40 mg Oral Q supper   . Chlorhexidine Gluconate Cloth  6 each Topical Q0600  . clonazePAM  0.5 mg Oral QID  . docusate sodium  100 mg Oral BID  . escitalopram  5 mg Oral Daily  . ferrous sulfate  325 mg Oral TID PC  . metoprolol tartrate  12.5 mg Oral BID  . multivitamin with minerals  1 tablet Oral Daily  . mupirocin ointment  1 application Nasal BID  . omega-3 acid ethyl esters  1 g Oral Q lunch  . psyllium  1 packet Oral Daily  . senna  1 tablet Oral BID  . sodium chloride flush  3 mL Intravenous Q12H  . traMADol  100 mg Oral TID  . vitamin B-12  1,000 mcg Oral Daily   Continuous Infusions: . heparin 950 Units/hr (03/13/20 0400)  . methocarbamol (ROBAXIN) IV      LOS: 4 days   Merlene Laughter, DO [facility] PAGER is on AMION  If 7PM-7AM, please contact night-coverage [email or web address]

## 2020-03-13 NOTE — Consult Note (Signed)
ANTICOAGULATION CONSULT NOTE  Pharmacy Consult for heparin dosing  Indication: atrial fibrillation  Allergies  Allergen Reactions  . Other Other (See Comments)  . Penicillins Other (See Comments)    Has patient had a PCN reaction causing immediate rash, facial/tongue/throat swelling, SOB or lightheadedness with hypotension: Unknown Has patient had a PCN reaction causing severe rash involving mucus membranes or skin necrosis: Unknown Has patient had a PCN reaction that required hospitalization: Unknown Has patient had a PCN reaction occurring within the last 10 years: No If all of the above answers are "NO", then may proceed with Cephalosporin use.  . Sulfa Antibiotics Other (See Comments)    Patient Measurements: Height: 5\' 1"  (154.9 cm) Weight:  (Low bed) IBW/kg (Calculated) : 47.8 Heparin Dosing Weight: 56kg  Vital Signs: Temp: 98.1 F (36.7 C) (09/25 1229) Temp Source: Oral (09/25 1229) BP: 158/69 (09/25 1229) Pulse Rate: 75 (09/25 1229)  Labs: Recent Labs    03/11/20 0816 03/11/20 0816 03/12/20 0607 03/12/20 1804 03/13/20 0257 03/13/20 1022 03/13/20 1214 03/13/20 1301  HGB 8.4*   < > 7.2*  --  9.1*  --   --   --   HCT 26.3*  --  22.7*  --  27.5*  --   --   --   PLT 204  --  198  --  232  --   --   --   APTT  --   --   --  37*  --   --   --   --   LABPROT  --   --   --  14.6  --   --   --   --   INR  --   --   --  1.2  --   --   --   --   HEPARINUNFRC  --   --   --   --  0.19*  --   --  0.16*  CREATININE 0.93  --  0.62  --  0.70  --   --   --   TROPONINIHS  --   --   --  1,523*  --  1,093* 827*  --    < > = values in this interval not displayed.    Estimated Creatinine Clearance: 44.4 mL/min (by C-G formula based on SCr of 0.7 mg/dL).   Medical History: Past Medical History:  Diagnosis Date  . Anxiety   . Depression   . Essential hypertension   . Hyperlipidemia     Assessment: Patient being started on heparin infusion for afib.  Pt receiving  enoxaprin 40mg  for DVT prophylaxis. She received last dose of enoxaprin @1044  today.  Given Hbg of 7.2, discussed with provider whether bolus was needed or indicated. Per discussion with Dr. 03/15/20, orders to hold initial bolus dose.   9/25 0257 HL 0.19  9/25 1301 HL 0.16   Goal of Therapy:  Heparin level 0.3-0.7 units/ml   Plan:  Heparin level is subtherapeutic. Will give 1600 unit bolus and increase heparin infusion rate to 1100 unit/hr. Recheck heparin level in 8 hours. CBC daily while on heparin.   Marland Mcalpine, PharmD, BCPS 03/13/2020,1:43 PM

## 2020-03-13 NOTE — Progress Notes (Signed)
PT Cancellation Note  Patient Details Name: Michelle Bauer MRN: [id number] DOB: 17-Nov-1940   Cancelled Treatment:     PT attempt. Pt currently having diagnostic procedure. Will return at later time/date when pt is available.    Rushie Chestnut 03/13/2020, 2:29 PM

## 2020-03-13 NOTE — Progress Notes (Signed)
Physical Therapy Treatment Patient Details Name: Michelle Bauer MRN: [id number] DOB: December 01, 1940 Today's Date: 03/13/2020    History of Present Illness Pt is a 79 y.o. female with medical history significant of 79 year old female with past medical history of hypertension, hyperlipidemia, anxiety/depression, severe scoliosis, diverticular bleed presented status post fall.  Patient was on her usual health and her daughter was coming to visit her.  She went to unlock to double lock since her daughter did not have the key to the door and she reports a chair was pulled out in the kitchen and somehow she stumbled over it and fell.  She did hit her head against a wall.  Daughter arrived 30 minutes later meanwhile patient was on the floor.  Pt initially refused to come to the ER, but could not walk.  Found to have R hip fx and is now s/p ORIF 9/22.    PT Comments    Pt was long sitting in bed with supportive daughter at bedside. Pt is agreeable to OOB activity and request to urinate on commode.History of cognition deficits at baseline. She was able to exit L side of bed with increased time + mod/max assist. Pt very slow moving 2/2 to pain.required increased time to process desired task requested. Mod assist to stand from slightly elevated bed height.Mod/max assist to stand from [facility] and ambulate ~ 5 ft to recliner. Very slow step to antalgic gait pattern. Has difficulty with advancement of LLE. Pt was on 2 L o2 upon arriving but required increase to 4L to maintain > 88%.  Overall improved abilities today but continues to have deficits. Will require extensive PT at DC to address deficits. Recommend DC to SNF. She was in recliner with call bell in reach, chair alarm in place, and daughter at bedside. Lab tech entered room as therapist exited.     Follow Up Recommendations  SNF     Equipment Recommendations  Other (comment) (defer to next level of care)    Recommendations for Other Services        Precautions / Restrictions Precautions Precautions: Fall Restrictions Weight Bearing Restrictions: Yes RLE Weight Bearing: Weight bearing as tolerated    Mobility  Bed Mobility Overal bed mobility: Needs Assistance Bed Mobility: Supine to Sit     Supine to sit: Mod assist;Max assist;HOB elevated     General bed mobility comments: pt was able to progress from long sitting in bed to short sit with increased time and vcs throughout for progression. mod-max to achieve EOB sitting  Transfers Overall transfer level: Needs assistance Equipment used: Rolling walker (2 wheeled) Transfers: Sit to/from Stand Sit to Stand: Mod assist;From elevated surface         General transfer comment: pt was able to stand 2 x EOB and 1 x from [facility]. vcs for hand placement and improved technique.  Ambulation/Gait Ambulation/Gait assistance: Mod assist;Max assist Gait Distance (Feet): 5 Feet Assistive device: Rolling walker (2 wheeled) Gait Pattern/deviations: Step-to pattern;Antalgic Gait velocity: decreased   General Gait Details: pt was able to ambulate from [facility] to recliner       Balance Overall balance assessment: Needs assistance Sitting-balance support: Bilateral upper extremity supported;Feet supported Sitting balance-Leahy Scale: Fair     Standing balance support: Bilateral upper extremity supported;During functional activity Standing balance-Leahy Scale: Poor Standing balance comment: pt has improved balance from previous date according to daughter but continues to be a high fall risk.        Cognition Arousal/Alertness:  (awake and  conversational) Behavior During Therapy: Flat affect Overall Cognitive Status: Impaired/Different from baseline Area of Impairment: Orientation;Attention;Following commands;Safety/judgement;Problem solving;Awareness;Memory                General Comments: pt is awake and alert throughout however does have cognition deficits. supportive daughter  present throughout and very helpful. needs increased time to process and VCs throughout for desired task              Pertinent Vitals/Pain Pain Assessment: 0-10 Pain Score: 6  Pain Location: RLE Pain Descriptors / Indicators: Discomfort;Nagging Pain Intervention(s): Limited activity within patient's tolerance;Monitored during session;Premedicated before session;Repositioned           PT Goals (current goals can now be found in the care plan section) Acute Rehab PT Goals Patient Stated Goal: go home Progress towards PT goals: Progressing toward goals    Frequency    BID      PT Plan Current plan remains appropriate       AM-PAC PT "6 Clicks" Mobility   Outcome Measure  Help needed turning from your back to your side while in a flat bed without using bedrails?: A Lot Help needed moving from lying on your back to sitting on the side of a flat bed without using bedrails?: A Lot Help needed moving to and from a bed to a chair (including a wheelchair)?: A Lot Help needed standing up from a chair using your arms (e.g., wheelchair or bedside chair)?: A Lot Help needed to walk in hospital room?: A Lot Help needed climbing 3-5 steps with a railing? : Total 6 Click Score: 11    End of Session Equipment Utilized During Treatment: Gait belt Activity Tolerance: Patient tolerated treatment well Patient left: in chair;with call bell/phone within reach;with chair alarm set;with family/visitor present Nurse Communication: Mobility status PT Visit Diagnosis: Muscle weakness (generalized) (M62.81);Difficulty in walking, not elsewhere classified (R26.2);Pain Pain - Right/Left: Right Pain - part of body: Hip     Time: 8185-9093 PT Time Calculation (min) (ACUTE ONLY): 37 min  Charges:  $Gait Training: 8-22 mins $Therapeutic Activity: 8-22 mins                     Jetta Lout PTA 03/13/20, 2:37 PM

## 2020-03-13 NOTE — Progress Notes (Signed)
Notified by CCMD that patient has converted to NSR with HR 60-70s.  VSS.

## 2020-03-13 NOTE — Progress Notes (Signed)
Subjective:  POD #3 s/p IM fixation for right IT hip fracture.   Patient reports right hip pain as mild.  Patient OOB to a chair.   Patient transferred to 2A yesterday for new onset a. Fib.  Patient has converted to sinus rhythm overnight.  Patient denies CP or SOB.   She is more alert and talkative today.  Objective:   VITALS:   Vitals:   03/13/20 0301 03/13/20 0343 03/13/20 0805 03/13/20 1229  BP: (!) 142/60 (!) 146/60 (!) 144/60 (!) 158/69  Pulse: 66 65 70 75  Resp:   16 16  Temp:  97.8 F (36.6 C) 98.1 F (36.7 C) 98.1 F (36.7 C)  TempSrc:  Oral Oral Oral  SpO2: (!) 83% 97% 96% 98%  Weight:      Height:        PHYSICAL EXAM: Right lower extremity Neurovascular intact Sensation intact distally Intact pulses distally Dorsiflexion/Plantar flexion intact Incision: no drainage No cellulitis present Compartment soft  LABS  Results for orders placed or performed during the hospital encounter of 03/09/20 (from the past 24 hour(s))  Prepare RBC (crossmatch)     Status: None   Collection Time: 03/12/20  4:39 PM  Result Value Ref Range   Order Confirmation      ORDER PROCESSED BY BLOOD BANK Performed at [facility], [address] [place], [place] [address]   APTT     Status: Abnormal   Collection Time: 03/12/20  6:04 PM  Result Value Ref Range   aPTT 37 (H) 24 - 36 seconds  Protime-INR     Status: None   Collection Time: 03/12/20  6:04 PM  Result Value Ref Range   Prothrombin Time 14.6 11.4 - 15.2 seconds   INR 1.2 0.8 - 1.2  Type and screen [facility]     Status: None   Collection Time: 03/12/20  6:04 PM  Result Value Ref Range   ABO/RH(D) O POS    Antibody Screen NEG    Sample Expiration 03/15/2020,2359    Unit Number [id number]    Blood Component Type RED CELLS,LR    Unit division 00    Status of Unit ISSUED,FINAL    Transfusion Status OK TO TRANSFUSE    Crossmatch Result      Compatible Performed at [facility], [address] [place], [place] [address]   Troponin I (High Sensitivity)     Status: Abnormal   Collection Time: 03/12/20  6:04 PM  Result Value Ref Range   Troponin I (High Sensitivity) 1,523 (HH) <18 ng/L  CBC with Differential/Platelet     Status: Abnormal   Collection Time: 03/13/20  2:57 AM  Result Value Ref Range   WBC 8.3 4.0 - 10.5 K/uL   RBC 3.18 (L) 3.87 - 5.11 MIL/uL   Hemoglobin 9.1 (L) 12.0 - 15.0 g/dL   HCT 41.9 (L) 36 - 46 %   MCV 86.5 80.0 - 100.0 fL   MCH 28.6 26.0 - 34.0 pg   MCHC 33.1 30.0 - 36.0 g/dL   RDW 37.9 02.4 - 09.7 %   Platelets 232 150 - 400 K/uL   nRBC 0.0 0.0 - 0.2 %   Neutrophils Relative % 70 %   Neutro Abs 5.8 1.7 - 7.7 K/uL   Lymphocytes Relative 21 %   Lymphs Abs 1.7 0.7 - 4.0 K/uL   Monocytes Relative 8 %   Monocytes Absolute 0.7 0 - 1 K/uL   Eosinophils Relative  0 %   Eosinophils Absolute 0.0 0 - 0 K/uL   Basophils Relative 0 %   Basophils Absolute 0.0 0 - 0 K/uL   Immature Granulocytes 1 %   Abs Immature Granulocytes 0.06 0.00 - 0.07 K/uL  Comprehensive metabolic panel     Status: Abnormal   Collection Time: 03/13/20  2:57 AM  Result Value Ref Range   Sodium 135 135 - 145 mmol/L   Potassium 4.3 3.5 - 5.1 mmol/L   Chloride 102 98 - 111 mmol/L   CO2 27 22 - 32 mmol/L   Glucose, Bld 110 (H) 70 - 99 mg/dL   BUN 23 8 - 23 mg/dL   Creatinine, Ser 7.86 0.44 - 1.00 mg/dL   Calcium 7.9 (L) 8.9 - 10.3 mg/dL   Total Protein 5.6 (L) 6.5 - 8.1 g/dL   Albumin 2.2 (L) 3.5 - 5.0 g/dL   AST 34 15 - 41 U/L   ALT 15 0 - 44 U/L   Alkaline Phosphatase 57 38 - 126 U/L   Total Bilirubin 0.8 0.3 - 1.2 mg/dL   GFR calc non Af Amer >60 >60 mL/min   GFR calc Af Amer >60 >60 mL/min   Anion gap 6 5 - 15  Phosphorus     Status: None   Collection Time: 03/13/20  2:57 AM  Result Value Ref Range   Phosphorus 3.0 2.5 - 4.6 mg/dL  Magnesium     Status: None   Collection Time: 03/13/20  2:57 AM  Result Value Ref Range   Magnesium 1.8 1.7 -  2.4 mg/dL  Heparin level (unfractionated)     Status: Abnormal   Collection Time: 03/13/20  2:57 AM  Result Value Ref Range   Heparin Unfractionated 0.19 (L) 0.30 - 0.70 IU/mL  Brain natriuretic peptide     Status: Abnormal   Collection Time: 03/13/20  2:57 AM  Result Value Ref Range   B Natriuretic Peptide 477.4 (H) 0.0 - 100.0 pg/mL  Troponin I (High Sensitivity)     Status: Abnormal   Collection Time: 03/13/20 10:22 AM  Result Value Ref Range   Troponin I (High Sensitivity) 1,093 (HH) <18 ng/L    DG Chest 1 View  Result Date: 03/12/2020 CLINICAL DATA:  Hypoxia EXAM: CHEST  1 VIEW COMPARISON:  03/09/2020 FINDINGS: Single frontal view of the chest demonstrates marked right convex scoliosis. The cardiac silhouette is enlarged. 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 Since the previous exam, left basilar consolidation and left pleural effusion have developed. There is increased central vascular congestion. No pneumothorax. IMPRESSION: 1. Findings consistent with congestive heart failure, with worsening vascular congestion, left basilar consolidation, and new left pleural effusion. Electronically Signed   By: Sharlet Salina M.D.   On: 03/12/2020 17:54    Assessment/Plan: 3 Days Post-Op   Active Problems:   Generalized anxiety disorder   HLD (hyperlipidemia)   Fracture   Essential hypertension   Hypokalemia   Acute delirium   Leukocytosis  Patient doing well from an orthopaedic standpoint.  Continue PT as medically appropriate.  Hgb improved.  Anticoagulation per medical team.  Patient currently on heparin.  No evidence of increase surgical site bleeding.    Juanell Fairly , MD 03/13/2020, 12:45 PM

## 2020-03-13 NOTE — Progress Notes (Addendum)
Progress Note  Patient Name: Michelle Bauer Date of Encounter: 03/13/2020  Primary Cardiologist: Julien Nordmann, MD  Subjective   No c/p or sob.  Converted to sinus rhythm last night.  Pt remembers experiencing palpitations but isn't sure if she experienced c/p or dyspnea.  Inpatient Medications    Scheduled Meds: . atorvastatin  40 mg Oral Q supper  . Chlorhexidine Gluconate Cloth  6 each Topical Q0600  . clonazePAM  0.5 mg Oral QID  . docusate sodium  100 mg Oral BID  . escitalopram  5 mg Oral Daily  . ferrous sulfate  325 mg Oral TID PC  . furosemide  20 mg Intravenous Once  . multivitamin with minerals  1 tablet Oral Daily  . mupirocin ointment  1 application Nasal BID  . omega-3 acid ethyl esters  1 g Oral Q lunch  . psyllium  1 packet Oral Daily  . senna  1 tablet Oral BID  . sodium chloride flush  3 mL Intravenous Q12H  . traMADol  100 mg Oral TID  . vitamin B-12  1,000 mcg Oral Daily   Continuous Infusions: . amiodarone 30 mg/hr (03/13/20 0400)  . heparin 950 Units/hr (03/13/20 0400)  . magnesium sulfate bolus IVPB 1 g (03/13/20 1031)  . methocarbamol (ROBAXIN) IV     PRN Meds: acetaminophen, alum & mag hydroxide-simeth, bisacodyl, bisacodyl, haloperidol lactate, hydrALAZINE, HYDROcodone-acetaminophen, magnesium citrate, menthol-cetylpyridinium **OR** phenol, methocarbamol **OR** methocarbamol (ROBAXIN) IV, morphine injection, ondansetron **OR** ondansetron (ZOFRAN) IV, polyethylene glycol, promethazine, senna-docusate   Vital Signs    Vitals:   03/13/20 0229 03/13/20 0301 03/13/20 0343 03/13/20 0805  BP: 126/71 (!) 142/60 (!) 146/60 (!) 144/60  Pulse: 66 66 65 70  Resp:    16  Temp: 98.4 F (36.9 C)  97.8 F (36.6 C) 98.1 F (36.7 C)  TempSrc: Oral  Oral Oral  SpO2: 100% (!) 83% 97% 96%  Weight:      Height:        Intake/Output Summary (Last 24 hours) at 03/13/2020 1032 Last data filed at 03/13/2020 0900 Gross per 24 hour  Intake 696.49 ml    Output 500 ml  Net 196.49 ml   Filed Weights   03/09/20 1428 03/12/20 1823  Weight: 56 kg 54.4 kg    Physical Exam   GEN: Well nourished, well developed, in no acute distress.  HEENT: Grossly normal.  Neck: Supple, no JVD, carotid bruits, or masses. Cardiac: RRR, no murmurs, rubs, or gallops. No clubbing, cyanosis, edema.  Radials/DP/PT 2+ and equal bilaterally.  Respiratory:  Respirations regular and unlabored, bibasilar crackles. GI: semi-firm and protuberant, BS + x 4. MS: no deformity or atrophy. Skin: warm and dry, no rash. Neuro:  Strength and sensation are intact. Psych:  Disoriented to time. Normal affect.  Labs    Chemistry Recent Labs  Lab 03/11/20 0816 03/12/20 0607 03/13/20 0257  NA 136 136 135  K 3.6 3.2* 4.3  CL 99 102 102  CO2 28 26 27   GLUCOSE 111* 82 110*  BUN 24* 20 23  CREATININE 0.93 0.62 0.70  CALCIUM 8.3* 7.9* 7.9*  PROT 6.3* 5.5* 5.6*  ALBUMIN 2.6* 2.2* 2.2*  AST 21 19 34  ALT 12 8 15   ALKPHOS 53 47 57  BILITOT 0.7 0.7 0.8  GFRNONAA 59* >60 >60  GFRAA >60 >60 >60  ANIONGAP 9 8 6      Hematology Recent Labs  Lab 03/11/20 0816 03/12/20 0607 03/13/20 0257  WBC 11.5* 7.4 8.3  RBC 2.95*  3.02* 2.61* 3.18*  HGB 8.4* 7.2* 9.1*  HCT 26.3* 22.7* 27.5*  MCV 89.2 87.0 86.5  MCH 28.5 27.6 28.6  MCHC 31.9 31.7 33.1  RDW 13.6 13.8 13.7  PLT 204 198 232    Cardiac Enzymes  Recent Labs  Lab 03/12/20 1804  TROPONINIHS 1,523*       Lipids  Lab Results  Component Value Date   CHOL 110 03/05/2018   HDL 35 (L) 03/05/2018   LDLCALC 42 03/05/2018   TRIG 165 (H) 03/05/2018   CHOLHDL 3.1 03/05/2018    HbA1c  Lab Results  Component Value Date   HGBA1C 5.3 03/11/2020    Radiology    DG Chest 1 View  Result Date: 03/12/2020 CLINICAL DATA:  Hypoxia EXAM: CHEST  1 VIEW COMPARISON:  03/09/2020 FINDINGS: Single frontal view of the chest demonstrates marked right convex scoliosis. The cardiac silhouette is enlarged. 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 Since the previous exam, left basilar consolidation and left pleural effusion have developed. There is increased central vascular congestion. No pneumothorax. IMPRESSION: 1. Findings consistent with congestive heart failure, with worsening vascular congestion, left basilar consolidation, and new left pleural effusion. Electronically Signed   By: Sharlet Salina M.D.   On: 03/12/2020 17:54   DG Chest 1 View  Result Date: 03/09/2020 CLINICAL DATA:  Pain EXAM: CHEST  1 VIEW COMPARISON:  None. FINDINGS: There is a significant S shaped curvature of the thoracolumbar spine. The heart size appears mildly enlarged. The patient is status post prior median sternotomy. There is no pneumothorax or large pleural effusion. There is no acute osseous abnormality. IMPRESSION: No active disease. Electronically Signed   By: Katherine Mantle M.D.   On: 03/09/2020 15:15   CT HEAD WO CONTRAST  Result Date: 03/09/2020 CLINICAL DATA:  Larey Seat today, hip fracture EXAM: CT HEAD WITHOUT CONTRAST TECHNIQUE: Contiguous axial images were obtained from the base of the skull through the vertex without intravenous contrast. COMPARISON:  None. FINDINGS: Brain: Scattered hypodensities are seen throughout the periventricular white matter consistent with age-indeterminate small vessel ischemic changes, likely chronic. No other signs of acute infarct or hemorrhage. Lateral ventricles and midline structures are unremarkable. No acute extra-axial fluid collections. No mass effect. Vascular: No hyperdense vessel or unexpected calcification. Skull: Normal. Negative for fracture or focal lesion. Sinuses/Orbits: No acute finding. Other: None. IMPRESSION: 1. Age-indeterminate small-vessel ischemic changes in the white matter, favor chronic. 2. No acute intracranial trauma. Electronically Signed   By: Sharlet Salina M.D.   On: 03/09/2020 17:47   DG HIP OPERATIVE UNILAT W OR W/O PELVIS RIGHT  Result Date:  03/10/2020 CLINICAL DATA:  Right hip fracture EXAM: OPERATIVE right HIP (WITH PELVIS IF PERFORMED) 16 VIEWS TECHNIQUE: Fluoroscopic spot image(s) were submitted for interpretation post-operatively. COMPARISON:  03/10/2020 FINDINGS: C-arm images demonstrate fixation of right intertrochanteric fracture with compression screw and locking intramedullary rod extending into the distal femur. Fracture alignment satisfactory IMPRESSION: Right intertrochanteric fracture ORIF. Electronically Signed   By: Marlan Palau M.D.   On: 03/10/2020 16:44   DG Hip Unilat W or Wo Pelvis 2-3 Views Right  Result Date: 03/09/2020 CLINICAL DATA:  Pain EXAM: DG HIP (WITH OR WITHOUT PELVIS) 2-3V RIGHT COMPARISON:  None. FINDINGS: There is an acute displaced intratrochanteric fracture of the proximal right femur. There is no dislocation. There is osteopenia. 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 Vascular calcifications are noted. IMPRESSION: Acute displaced intratrochanteric fracture of the proximal right femur. Electronically Signed  By: Katherine Mantlehristopher  Green M.D.   On: 03/09/2020 15:14   DG FEMUR, MIN 2 VIEWS RIGHT  Result Date: 03/10/2020 CLINICAL DATA:  Postop intramedullary nail for fracture EXAM: RIGHT FEMUR 2 VIEWS COMPARISON:  03/09/2020 FINDINGS: Intertrochanteric fracture has been fixed with a screw and locking intramedullary rod. Hardware in good position. Fracture alignment satisfactory. IMPRESSION: ORIF intertrochanteric fracture on the right. Electronically Signed   By: Marlan Palauharles  Clark M.D.   On: 03/10/2020 16:19    Telemetry    Converted to sinus rhythm w/ rates in 60's @ 21:47 on 9/24. - Personally Reviewed  Cardiac Studies   Echo pending  Patient Profile     79 y.o. female with a hx of hypertension, hyperlipidemia, severe scoliosis, possible dementia presenting to the hospital 9/21 after mechanical fall, hip fracture, post surgery developing atrial fibrillation with RVR.  Assessment & Plan    1.  Afib w/ RVR:  Developed 9/24 in setting of R hip fx s/p IM nail on 9/23, hypokalemia, and anemia.  Rates up to 160's. She did not respond to IV dilt and so amio was started w/ conversion to sinus rhythm last night @ 21:47.  Remains in sinus this AM. CHA2DS2VASc = 4, currently on heparin.  Echo pending.  If nl EF and no plan for invasive eval this hospitalization, so long as h/h stable, can transition to eliquis 5 bid (though wt < 60kg and somewhat frail appearing, she is < age 79 and nl creat).  She will initially be going to rehab w/ PT.  Dtr notes that she was likely falling at home quite a bit prior to admission.  May have to reconsider OAC at some point in the future pending recovery.  Re: amio, she is taking POs and can transition to 200 bid today after current bag finishes. Hopefully will not  require long term.  2.  R intertrochanteric hip fracture: s/p intramedullary nail 9/23. Per ortho.  3.  Acute CHF:  In setting of hypotension and volume repletion.  IVF d/c 9/24.  Provided one dose of IV lasix 9/24 PM.  Net + 1.9l for admission.  Still w/ bibasilar crackles.  Will give another dose of lasix 20 IV this AM. Echo pending.  4.  Demand ischemia:  HsT 1523 on 9/24 in setting of rapid Afib. Second just drawn.  No c/p. Echo pending. Pt is not an ideal invasive candidate given relative frailty and recent surgery.  If echo w/ nl EF, w/o rwma, would likely benefit from conservative rx, allowing for recovery from hip surgery and then can readdress ischemic eval as outpt.  Will add asa for now (would likely d/c after DOAC started). Cont statin and f/u lipids.  Will add low dose  blocker.  HRs 60's in setting of IV amio currently.  5.  Delirium/dementia: Chronic. Pleasant.  6.  ABL anemia:  H/H down to 7.2/22.7 9/24 now s/p 1u prbcs.  9.1/27.5 this AM. Follow on anticoagulation.  Signed, Nicolasa Duckinghristopher Berge, NP  03/13/2020, 10:32 AM    Pt seen and examined   I agree with findings as noted by C Berge      Pt comfortable in chair  REmains confused  She deneis CP    On exam:   BP 130s to 150s   HR 60-80s (SR) Lungs are clear to auscul Cardiac RRR  No S3   Ext are without edema  Trop peak 1093     Echo   PENDING  Afib:  Remains in SR  on amiodarone   Would continue for now.   Would also start Eliquis now that taking PO   Follow closely  Use for short term and if no recurrence back off amio and then Eliquis   Watch for falls   May need to reassess  Will be supervised in short term    Elevated trop:  May reflect demand ischemia   Pt denies CP (Hx is difficult however)  Echo pending   Anemia:  Follw H/H closely on meds   Will continue to follow  For questions or updates, please contact   Please consult [email or web address] for contact info under Cardiology/STEMI.

## 2020-03-13 NOTE — Consult Note (Signed)
ANTICOAGULATION CONSULT NOTE - Initial Consult  Pharmacy Consult for heparin dosing  Indication: atrial fibrillation  Allergies  Allergen Reactions  . Other Other (See Comments)  . Penicillins Other (See Comments)    Has patient had a PCN reaction causing immediate rash, facial/tongue/throat swelling, SOB or lightheadedness with hypotension: Unknown Has patient had a PCN reaction causing severe rash involving mucus membranes or skin necrosis: Unknown Has patient had a PCN reaction that required hospitalization: Unknown Has patient had a PCN reaction occurring within the last 10 years: No If all of the above answers are "NO", then may proceed with Cephalosporin use.  . Sulfa Antibiotics Other (See Comments)    Patient Measurements: Height: 5\' 1"  (154.9 cm) Weight: 54.4 kg (120 lb) IBW/kg (Calculated) : 47.8 Heparin Dosing Weight: 56kg  Vital Signs: Temp: 97.6 F (36.4 C) (09/25 0211) Temp Source: Oral (09/25 0211) BP: 131/61 (09/25 0211) Pulse Rate: 67 (09/25 0211)  Labs: Recent Labs    03/10/20 0412 03/10/20 0412 03/11/20 0816 03/11/20 0816 03/12/20 0607 03/12/20 1804 03/13/20 0257  HGB 10.1*   < > 8.4*   < > 7.2*  --  9.1*  HCT 30.1*   < > 26.3*  --  22.7*  --  27.5*  PLT 223   < > 204  --  198  --  232  APTT  --   --   --   --   --  37*  --   LABPROT  --   --   --   --   --  14.6  --   INR  --   --   --   --   --  1.2  --   HEPARINUNFRC  --   --   --   --   --   --  0.19*  CREATININE 0.92  --  0.93  --  0.62  --   --   TROPONINIHS  --   --   --   --   --  1,523*  --    < > = values in this interval not displayed.    Estimated Creatinine Clearance: 44.4 mL/min (by C-G formula based on SCr of 0.62 mg/dL).   Medical History: Past Medical History:  Diagnosis Date  . Anxiety   . Depression   . Essential hypertension   . Hyperlipidemia     Assessment: Patient being started on heparin infusion for afib.  Pt receiving enoxaprin 40mg  for DVT prophylaxis. She  received last dose of enoxaprin @1044  today.  Given Hbg of 7.2, discussed with provider whether bolus was needed or indicated. Per discussion with Dr. 02-19-1978, orders to hold initial bolus dose.   Goal of Therapy:  Heparin level 0.3-0.7 units/ml   Plan:  Start heparin infusion at 750 units/hr  Will check anti Xa level 8hrs after start of infusion (0130). Will check CBC with AM labs and monitor plts and Hgb closely.  09/25:  HL @ 0257 = 0.19 Will order Heparin 1600 units IV X 1 bolus and increase drip rate to 950 units/hr. Will recheck HL 8 hrs after rate change.   Brailyn Delman D 03/13/2020,3:33 AM

## 2020-03-14 ENCOUNTER — Inpatient Hospital Stay: Payer: Medicare Other

## 2020-03-14 DIAGNOSIS — I5031 Acute diastolic (congestive) heart failure: Secondary | ICD-10-CM

## 2020-03-14 LAB — COMPREHENSIVE METABOLIC PANEL
ALT: 12 U/L (ref 0–44)
AST: 21 U/L (ref 15–41)
Albumin: 2.2 g/dL — ABNORMAL LOW (ref 3.5–5.0)
Alkaline Phosphatase: 62 U/L (ref 38–126)
Anion gap: 8 (ref 5–15)
BUN: 19 mg/dL (ref 8–23)
CO2: 28 mmol/L (ref 22–32)
Calcium: 8.1 mg/dL — ABNORMAL LOW (ref 8.9–10.3)
Chloride: 99 mmol/L (ref 98–111)
Creatinine, Ser: 0.63 mg/dL (ref 0.44–1.00)
GFR calc Af Amer: >60 mL/min (ref >60)
GFR calc non Af Amer: >60 mL/min (ref >60)
Glucose, Bld: 88 mg/dL (ref 70–99)
Potassium: 3.6 mmol/L (ref 3.5–5.1)
Sodium: 135 mmol/L (ref 135–145)
Total Bilirubin: 0.9 mg/dL (ref 0.3–1.2)
Total Protein: 5.8 g/dL — ABNORMAL LOW (ref 6.5–8.1)

## 2020-03-14 LAB — CBC WITH DIFFERENTIAL/PLATELET
Abs Immature Granulocytes: 0.04 10*3/uL (ref 0.00–0.07)
Basophils Absolute: 0 10*3/uL (ref 0.0–0.1)
Basophils Relative: 0 %
Eosinophils Absolute: 0.1 10*3/uL (ref 0.0–0.5)
Eosinophils Relative: 1 %
HCT: 29.6 % — ABNORMAL LOW (ref 36.0–46.0)
Hemoglobin: 9.9 g/dL — ABNORMAL LOW (ref 12.0–15.0)
Immature Granulocytes: 1 %
Lymphocytes Relative: 19 %
Lymphs Abs: 1.3 10*3/uL (ref 0.7–4.0)
MCH: 28.9 pg (ref 26.0–34.0)
MCHC: 33.4 g/dL (ref 30.0–36.0)
MCV: 86.3 fL (ref 80.0–100.0)
Monocytes Absolute: 0.5 10*3/uL (ref 0.1–1.0)
Monocytes Relative: 7 %
Neutro Abs: 4.9 10*3/uL (ref 1.7–7.7)
Neutrophils Relative %: 72 %
Platelets: 216 10*3/uL (ref 150–400)
RBC: 3.43 MIL/uL — ABNORMAL LOW (ref 3.87–5.11)
RDW: 14 % (ref 11.5–15.5)
WBC: 6.8 10*3/uL (ref 4.0–10.5)
nRBC: 0 % (ref 0.0–0.2)

## 2020-03-14 LAB — TSH: TSH: 1.369 u[IU]/mL (ref 0.350–4.500)

## 2020-03-14 LAB — ECHOCARDIOGRAM COMPLETE
Area-P 1/2: 2.76 cm2
Height: 61 in
S' Lateral: 2.28 cm
Weight: 1920 oz

## 2020-03-14 LAB — HEPARIN LEVEL (UNFRACTIONATED): Heparin Unfractionated: 0.46 IU/mL (ref 0.30–0.70)

## 2020-03-14 LAB — PHOSPHORUS: Phosphorus: 3 mg/dL (ref 2.5–4.6)

## 2020-03-14 LAB — MAGNESIUM: Magnesium: 1.8 mg/dL (ref 1.7–2.4)

## 2020-03-14 MED ORDER — FUROSEMIDE 10 MG/ML IJ SOLN
40.0000 mg | Freq: Once | INTRAMUSCULAR | Status: AC
  Administered 2020-03-14: 40 mg via INTRAVENOUS
  Filled 2020-03-14: qty 4

## 2020-03-14 MED ORDER — APIXABAN 5 MG PO TABS
5.0000 mg | ORAL_TABLET | Freq: Two times a day (BID) | ORAL | Status: DC
  Administered 2020-03-14 – 2020-03-17 (×6): 5 mg via ORAL
  Filled 2020-03-14 (×6): qty 1

## 2020-03-14 MED ORDER — IPRATROPIUM BROMIDE 0.02 % IN SOLN
0.5000 mg | Freq: Three times a day (TID) | RESPIRATORY_TRACT | Status: DC
  Administered 2020-03-14 – 2020-03-15 (×3): 0.5 mg via RESPIRATORY_TRACT
  Filled 2020-03-14 (×3): qty 2.5

## 2020-03-14 MED ORDER — INFLUENZA VAC A&B SA ADJ QUAD 0.5 ML IM PRSY
0.5000 mL | PREFILLED_SYRINGE | INTRAMUSCULAR | Status: DC
  Filled 2020-03-14: qty 0.5

## 2020-03-14 MED ORDER — LEVALBUTEROL HCL 0.63 MG/3ML IN NEBU
0.6300 mg | INHALATION_SOLUTION | Freq: Three times a day (TID) | RESPIRATORY_TRACT | Status: DC
  Administered 2020-03-14 – 2020-03-15 (×3): 0.63 mg via RESPIRATORY_TRACT
  Filled 2020-03-14 (×3): qty 3

## 2020-03-14 NOTE — Progress Notes (Addendum)
PROGRESS NOTE    Michelle Bauer  BZJ:[id number] DOB: Mar 20, 1941 DOA: 03/09/2020 PCP: [name], MD   Brief Narrative:  HPI per Dr. [name] on 03/09/20 Michelle Bauer is a 79 y.o. female with medical history significant of 79 year old female with past medical history of hypertension, hyperlipidemia, anxiety/depression, severe scoliosis, diverticular bleed presented status post mechanical fall which occurred yesterday.  Patient was on her usual health and her daughter was coming to visit her.  She went to unlock to double lock since her daughter did not have the key to the door and she reports a chair was pulled out in the kitchen and somehow she stumbled over it and fell.  She did hit her head against a wall.  Daughter arrived 30 minutes later meanwhile patient was on the floor.  This occurred yesterday and patient refused to come to the ER.  Daughter realized patient is unable to walk with a call patient's PCP and PCP recommended them to call EMS. She reports pain of rt hip as she points to it. Denies any other pain.  She denies headache, vision change, chest pain or shortness of breath.  Daughter thinks she is starting to have some dementia signs but has not been evaluated.  Patient reports chronic diarrhea however now is more formed and soft with Metamucil as she takes it every day.  Patient is Covid negative.  However she has not had her vaccination yet.  Daughters have been vaccinated.  ED Course: Blood pressure 137/78 afebrile, on room air 97%  Review of Systems: All systems reviewed and otherwise negative.  Potassium 3.3, creatinine 1.05, glucose 135 BUN 24 WBC 8.7 hemoglobin 9.4 hematocrit 29.6 MCV 87.6  Hip x-ray revealed acute displaced intertrochanteric fracture of the proximal right femur  Chest x-ray personally reviewed no acute cardiopulmonary disease EKG personally reviewed sinus rhythm no ischemic ST changes  **Interim History Patient is to underwent  operative procedure 03/10/20 for the patient's hip fracture postoperatively she became confused and agitated and likely had delirium in the setting of her anesthesia.  She is much more awake alert and oriented today.  Also her hospital course has been complicated by her hemoglobin dropping as well as having a vitamin B12 deficiency.  Maury Dus she also had a hypokalemia which will be replete.  PT OT evaluated and recommending SNF and and orthopedic surgery recommending weightbearing as tolerated.  03/13/20 Yesterday afternoon she went into Atrial fibrillation with RVR and was transferred to the cardiac progressive care unit.  She was given a dose of IV metoprolol 5 mg x 1 and started on a diltiazem bolus and drip which was ineffective so cardiology changed her to amiodarone.  She was also given 1 unit of PRBCs given her low hemoglobin as she is will be anticoagulated and she is now on a heparin drip with no bolus.  Chest x-ray yesterday showed "Findings consistent with congestive heart failure, with worsening vascular congestion, left basilar consolidation, and new left pleural effusion."  IV Lasix yesterday and will be given another dose of IV 20 mg today.  Echocardiogram is still pending and BNP was elevated at 477.4.  Her troponins were also elevated yesterday and cardiology is evaluating and awaiting echocardiogram.  She converted to normal sinus rhythm overnight and is stable today.  Troponins elevated at 1523 and now trending down and could be demand ischemia in the setting of rapid A. fib however echocardiogram is pending and she denies any chest pain.  Cardiology has  changed the patient from a heparin drip to Eliquis and have also added a low-dose beta-blocker.  03/14/20 We will give her another dose of IV Lasix today but increase the dose to 40 mg.  We will also transition her to p.o. Eliquis.  She still remains somewhat hypoxic and remains on supplemental oxygen via nasal cannula and her oxygen  requirement went up to 4 L today.  Will fluid restrict to 1500 mL as well.  Assessment & Plan:   Active Problems:   Generalized anxiety disorder   HLD (hyperlipidemia)   Fracture   Essential hypertension   Hypokalemia   Acute delirium   Leukocytosis  Acute Right Proximal Femur Fracture in the setting of Mechanical Fall status post intramedullary fixation of the right intertrochanteric hip fracture postoperative day 4 -Admitted to inpatient MedSurg  -Hip X-Ray showed "Acute displaced intratrochanteric fracture of the proximal right femur." -ED spoke to Dr. [name] of Orthopedics and plan is for Surgical intervention today (scheduled for 1 pm) -Continue with pain management and was given half a milligram of Dilaudid once in the ED.  Continue with hydrocodone-acetaminophen 1-2 tabs p.o. every 6 hours as needed moderate pain, IV morphine 0.5 mg every 2 as needed severe pain -Continue with supportive care and antiemetics with IV Zofran 4 mg every 6 hours as needed nausea or vomiting -She was n.p.o. after midnight for the procedure and is now on a regular diet -Bowel regimen initiated and she is on psyllium 1 packet p.o. daily, senna docusate 1 tab p.o. nightly as needed for mild constipation, as well as bisacodyl 5 mg p.o. daily as needed for moderate constipation adjust as necessary -IV fluid hydration has now been stopped given that she has been volume overloaded and will need acute heart failure, unknown type currently but will be obtaining echocardiogram.  She has received 2 doses of IV Lasix now and will receive a dose of IV 40 mg today  -Head CT obtained since she did fall and showed age indeterminate small vessel ischemic changes in the white matter which was favored to be chronic and no acute intra cranial trauma noted. -Deferring pharmacological VTE prophylaxis to orthopedic surgery and she will be started on Lovenox today; currently on SCDs and will -PT OT evaluating but formal note  is not put in yet so do not know other recommendations -Dr. [name] recommending it when she is less confused and recommends weightbearing as tolerated in the right lower extremity -Discharge planning either to SNF as PT/OT recommending this  -Dr. [name] evaluated and there is no evidence of increased surgical site bleeding at this time.  Acute Post-Operative Delirium, improved -Postoperatively and likely secondary to anesthesia -Haloperidol 2.5 mg IV every 6 as needed agitation -Reorient frequently and will place on delirium precautions -If not improving will consider head MRI given that she just recently had a head CT scan and will do further work-up with a TSH, RPR, vitamin B12 level -Delirium is now improved   Essential Hypertension -On hydrochlorothiazide PTA only which will be held -Blood pressure currently stable and since she is going to surgery will hold off and place her on IV -hydralazine as needed for systolic blood pressure more than 160 -Hold off on HCTZ  Also because of hypokalemia -She was placed on a Cardizem drip the day before yesterday and given a 10 mg bolus however this was ineffective and she became slightly hypotensive so this was stopped -Last BP reading was 156/61  Hyperlipidemia -LFTs normal on  admission with an AST of 28 and ALT of 12; now AST is 21, ALT is 12 -Continue home Atorvastatin 40 mg po qHS and Omega-3 Acid Ethyl Esters 1 gram po Daily with Lunch  Leukocytosis, improved -Mild likely reactive and is improved  -Patient WBC trended up to 11.5 and is now 6.9; if continues to worsen we will panculture -Likely in the setting of her surgery -Continue to monitor for signs and symptoms of infection -Repeat CBC in a.m.  Hypokalemia  -K+ on Admission was 3.3 and yesterday it was 3.2 and repeat this AM was 4.3 -Continue to Monitor and Replete as Necessary -Repeat BMP in the AM   Depression/Anxiety -C/w Escitalopram 5 mg po BID -Pharmacy has  verified the patient takes clonazepam 0.5 mg 4 times daily and this will be resumed  Acute Blood Loss Anemia on Chronic Normocytic Anemia -Patient's Hgb/Hct went from 9.4/29.6 -> 10.1/30.1 and dropped to 8.4/26.3 after surgery and yesterday was 7.2/22.7; she was typed and screened and given 1 unit of pRBC's and now Hgb/Hct is stable at 9.9/29.6 -? If Further Drop could have been dilutional given that she was receiving IVF with NS at 75 mL/hr but IVF now stopped  -Anemia panel was checked and showed an iron level of 32, U IBC 150, TIBC 182, saturation ratios of 18%, ferritin level 194, folate level of 8.2 vitamin B12 level was 128 -Continue to Monitor for S/Sx of Bleeding closely now that she is on Heparin gtt; Currently no overt bleeding noted -She was also started on Enoxaparin 40 mg sq q24h -Orthopedic Surgery started the patient on ferrous sulfate 325 mg p.o. 3 times daily  -As expected she did have a drop some post-operatively  -Repeat CBC in the AM  Vitamin B12 Deficiency -Patient's vitamin B12 level was checked and was 128 and low -Given cyanocobalamin 1000 mcg intramuscular injection once yesterday and will start p.o. supplementation this AM  Hyperglycemia  -Paitent's Blood Sugar on Admission was 135 and repeat was 125 -Check HbA1c was 5.3 -Continue to Monitor Blood Sugars carefully and if necessary will place on Sensitive Novolog SSI  New Onset A Fib with RVR s/p Conversion to NSR -Transferred to PCU -Started Cardizem Drip and Bolus but was ineffective so Cardiology changed her to an Amiodarone gtt and bolus. Converted to NSR so Cardiology has changed the patient to p.o. Amiodarone 200 mg twice daily -Start Heparin gtt with no bolus given Hgb of 7.2 and now is improved to 9.1; heparin drip is being continued but this is likely going to be changed to Eliquis per cardiology and this will be done today  -EKG confirmed with Rates in the 160's but now in NSR -Checked CXR, Troponins, and  ECHOCardiogram -ECHO showed "Left Ventricle: Left ventricular ejection fraction, by estimation, is 65 to 70%. The left ventricle has normal function. The left ventricle has no regional wall motion abnormalities. The left ventricular internal cavity size was normal in size. There is  no left ventricular hypertrophy. Left ventricular diastolic parameters  are consistent with Grade I diastolic dysfunction (impaired relaxation)." -TSH was 1.369 -Cardiology Consulted for further evaluation and Recommendations and recommending Diuresis and changing to po Apixaban  AKI -Mild and Improved -Presented with a BUN/Cr of 24/1.05 and and after IVF Hydration and now Diuresis has improved and BUN/Cr is now 20/0.62 -Getting diuresis and will avoid further nephrotoxic medications, contrast dyes, hypotension and renally dose medications -Continue to monitor and trend and repeat renal function in the AM  Acquired Thrombophilia -In the Setting of Atrial Fibrillation -CHA2DS2-VASc at least a 4-6 for Age, Sex, and HTN but now likely even more elevated given her history of heart failure -Anticoagulating with Heparin as above this is being changed to p.o. Eliquis by cardiology  Acute Respiratory Failure with Hypoxia -In the setting of acute Diastolic CHF -SpO2: 99 % O2 Flow Rate (L/min): 4 L/min  -Given a dose of IV Lasix 40 mg x1 this AM -Flutter Valve and Incentive Spirometry -Continue supplemental oxygen via nasal cannula and wean O2 as tolerated Continuous pulse oximetry and maintain O2 saturations greater than 92% We will need an ambulatory home O2 screen prior to discharge -If necessary will start the patient on breathing treatments  Acute Diastolic CHF -Unknown type at this time his echo still pending -In the setting of hypotension, anemia, and volume repletion -BNP was elevated at 477.4 -Chest x-ray yesterday showed "Findings consistent with congestive heart failure, with worsening vascular  congestion, left basilar consolidation, and new left pleural effusion." -Chest x-ray today showed "Cardiac shadow is enlarged but stable. Postsurgical changes are again seen. Stable left-sided pleural effusion and likely underlying atelectasis is present. Significant scoliosis is seen. Mild residual vascular congestion remains."  -Strict I's and O's, daily weights X-patient was given a dose of IV Lasix 20 mg the last 2 days and will receive another dose of IV Lasix 40 mg today -Will Fluid Restrict to 1500 mL -Patient is +979.7 mL since admission and Weight is down 3 lbs -Continue to monitor for signs and symptoms of volume overload  Elevated Troponin -Possibly demand ischemia but activity troponin trended up to 1523 and is now trending down with the last check being 827 -Denies any chest pain -Echocardiogram is ordered and pending to be done -Initially cardiology added aspirin 81 mg p.o. daily and also added low-dose beta-blocker with Toprol tartrate 12.5 mg p.o. twice daily -Currently on anticoagulation with a heparin drip for likely will be changed to Eliquis per cardiology  DVT prophylaxis: SCDs; Heparin gtt being transitioned to Eliquis  Code Status: DO NOT RESUSCITATE  Family Communication: Spoke to daughter at bedside Disposition Plan: Pending Ortho clearance as well as evaluation by PT and OT; Now needs Cardiac Clearance as well  Status is: Inpatient  Remains inpatient appropriate because:Unsafe d/c plan, IV treatments appropriate due to intensity of illness or inability to take PO and Inpatient level of care appropriate due to severity of illness   Dispo: The patient is from: Home              Anticipated d/c is to: SNF vs Home with Home Health PT/OT              Anticipated d/c date is: 1-2 days              Patient currently is not medically stable to d/c.  Consultants:   Orthopedic Surgery Dr. Juanell Fairly   Cardiology Dr. Newman Nickels  Procedures: INTRAMEDULLARY  FIXATION OF RIGHT INTERTROCHANTERIC HIP FRACTURE POD 4 by Dr. Rico Ala  Antimicrobials:  Anti-infectives (From admission, onward)   Start     Dose/Rate Route Frequency Ordered Stop   03/10/20 2000  ceFAZolin (ANCEF) IVPB 2g/100 mL premix        2 g 200 mL/hr over 30 Minutes Intravenous Every 6 hours 03/10/20 1657 03/11/20 0641   03/10/20 1431  gentamicin 80 mg in 0.9% sodium chloride 250 mL irrigation  Status:  Discontinued  As needed 03/10/20 1431 03/10/20 1502   03/10/20 1300  ceFAZolin (ANCEF) IVPB 2g/100 mL premix        2 g 200 mL/hr over 30 Minutes Intravenous 30 min pre-op 03/10/20 1220 03/10/20 1310   03/10/20 1250  ceFAZolin (ANCEF) 2-4 GM/100ML-% IVPB       Note to Pharmacy: Agnes Lawrenceobinson, Patricia  : cabinet override      03/10/20 1250 03/10/20 1332        Subjective: Seen and examined at bedside andshe is still dyspneic.  Denies any chest pain, lightheadedness or dizziness.  No nausea or vomiting.  Eating her breakfast and daughter states that she was "a little grumpy" because she has had to take all his pills.  No other concerns otherwise at this time and PT still recommending SNF.  Objective: Vitals:   03/14/20 0546 03/14/20 0828 03/14/20 0912 03/14/20 1143  BP: (!) 161/60 (!) 170/65  (!) 156/61  Pulse: 66 73 74 62  Resp: 18 18  16   Temp: 98.4 F (36.9 C) 98.9 F (37.2 C)  98 F (36.7 C)  TempSrc: Oral Oral  Oral  SpO2: 98% (!) 77% 95% 99%  Weight:      Height:        Intake/Output Summary (Last 24 hours) at 03/14/2020 1447 Last data filed at 03/14/2020 1403 Gross per 24 hour  Intake 240 ml  Output 900 ml  Net -660 ml   Filed Weights   03/09/20 1428 03/12/20 1823  Weight: 56 kg 54.4 kg   Examination: Physical Exam:  Constitutional: The patient is a thin elderly Caucasian female currently no acute distress but she is little dyspneic today.  Appears calm and comfortable eating breakfast and taking her pills Eyes: Lids and conjunctivae normal,  sclerae anicteric  ENMT: External Ears, Nose appear normal. Grossly normal hearing.  Neck: Appears normal, supple, no cervical masses, normal ROM, no appreciable thyromegaly; no JVD Respiratory: Diminished to auscultation bilaterally with coarse breath sounds and some crackles.  No appreciable wheezing, rales, rhonchi., no wheezing, rales, rhonchi or crackles.  Patient has a normal respiratory effort and she is not tachypneic but she is wearing supplemental oxygen via nasal cannula 4 L. Cardiovascular: RRR, no murmurs / rubs / gallops. S1 and S2 auscultated.  Trace extremity edema Abdomen: Soft, non-tender, non-distended.  Bowel sounds positive.  GU: Deferred. Musculoskeletal: No clubbing / cyanosis of digits/nails. No joint deformity upper and lower extremities.  Skin: No rashes, lesions, ulcers on limited skin evaluation. No induration; Warm and dry.  Neurologic: CN 2-12 grossly intact with no focal deficits. Romberg sign and cerebellar reflexes not assessed.  Psychiatric: Normal judgment and insight. Alert and oriented x 3. Normal mood and appropriate affect.   Data Reviewed: I have personally reviewed following labs and imaging studies  CBC: Recent Labs  Lab 03/09/20 1440 03/09/20 1440 03/10/20 0412 03/11/20 0816 03/12/20 0607 03/13/20 0257 03/14/20 0623  WBC 8.7   < > 9.9 11.5* 7.4 8.3 6.8  NEUTROABS 6.8  --   --  9.5* 5.7 5.8 4.9  HGB 9.4*   < > 10.1* 8.4* 7.2* 9.1* 9.9*  HCT 29.6*   < > 30.1* 26.3* 22.7* 27.5* 29.6*  MCV 87.6   < > 85.3 89.2 87.0 86.5 86.3  PLT 225   < > 223 204 198 232 216   < > = values in this interval not displayed.   Basic Metabolic Panel: Recent Labs  Lab 03/10/20 0412 03/11/20 0816 03/12/20 [id number] 03/13/20 0257  03/14/20 0623  NA 135 136 136 135 135  K 3.7 3.6 3.2* 4.3 3.6  CL 99 99 102 102 99  CO2 GLUCOSE 125* 111* 82 110* 88  BUN 23 24* CREATININE 0.92 0.93 0.62 0.70 0.63  CALCIUM 8.6* 8.3* 7.9* 7.9* 8.1*  MG  --   1.8 2.1 1.8 1.8  PHOS  --  3.0 2.9 3.0 3.0   GFR: Estimated Creatinine Clearance: 44.4 mL/min (by C-G formula based on SCr of 0.63 mg/dL). Liver Function Tests: Recent Labs  Lab 03/09/20 1440 03/11/20 0816 03/12/20 0607 03/13/20 0257 03/14/20 0623  AST 34 21  ALT ALKPHOS 72 53 47 57 62  BILITOT 0.6 0.7 0.7 0.8 0.9  PROT 7.2 6.3* 5.5* 5.6* 5.8*  ALBUMIN 2.9* 2.6* 2.2* 2.2* 2.2*   No results for input(s): LIPASE, AMYLASE in the last 168 hours. No results for input(s): AMMONIA in the last 168 hours. Coagulation Profile: Recent Labs  Lab 03/09/20 1823 03/12/20 1804  INR 1.1 1.2   Cardiac Enzymes: No results for input(s): CKTOTAL, CKMB, CKMBINDEX, TROPONINI in the last 168 hours. BNP (last 3 results) No results for input(s): PROBNP in the last 8760 hours. HbA1C: No results for input(s): HGBA1C in the last 72 hours. CBG: No results for input(s): GLUCAP in the last 168 hours. Lipid Profile: Recent Labs    03/13/20 2159  CHOL 98  HDL 33*  LDLCALC 48  TRIG 84  CHOLHDL 3.0   Thyroid Function Tests: Recent Labs    03/14/20 0623  TSH 1.369   Anemia Panel: No results for input(s): VITAMINB12, FOLATE, FERRITIN, TIBC, IRON, RETICCTPCT in the last 72 hours. Sepsis Labs: No results for input(s): PROCALCITON, LATICACIDVEN in the last 168 hours.  Recent Results (from the past 240 hour(s))  SARS Coronavirus 2 by RT PCR (hospital order, performed in [facility] hospital lab) Nasopharyngeal Nasopharyngeal Swab     Status: None   Collection Time: 03/09/20  2:31 PM   Specimen: Nasopharyngeal Swab  Result Value Ref Range Status   SARS Coronavirus 2 NEGATIVE NEGATIVE Final    Comment: (NOTE) SARS-CoV-2 target nucleic acids are NOT DETECTED.  The SARS-CoV-2 RNA is generally detectable in upper and lower respiratory specimens during the acute phase of infection. The lowest concentration of SARS-CoV-2 viral copies this assay can detect is 250 copies /  mL. A negative result does not preclude SARS-CoV-2 infection and should not be used as the sole basis for treatment or other patient management decisions.  A negative result may occur with improper specimen collection / handling, submission of specimen other than nasopharyngeal swab, presence of viral mutation(s) within the areas targeted by this assay, and inadequate number of viral copies (<250 copies / mL). A negative result must be combined with clinical observations, patient history, and epidemiological information.  Fact Sheet for Patients:   BoilerBrush.com.cy  Fact Sheet for Healthcare Providers: https://pope.com/  This test is not yet approved or  cleared by the [place] FDA and has been authorized for detection and/or diagnosis of SARS-CoV-2 by FDA under an Emergency Use Authorization (EUA).  This EUA will remain in effect (meaning this test can be used) for the duration of the COVID-19 declaration under Section 564(b)(1) of the Act, 21 U.S.C. section 360bbb-3(b)(1), unless the authorization is terminated or revoked sooner.  Performed at [facility], [address] [place], [place] [address]  Surgical PCR screen     Status: Abnormal   Collection Time: 03/09/20  9:25 PM   Specimen: Nasal Mucosa; Nasal Swab  Result Value Ref Range Status   MRSA, PCR NEGATIVE NEGATIVE Final   Staphylococcus aureus POSITIVE (A) NEGATIVE Final    Comment: (NOTE) The Xpert SA Assay (FDA approved for NASAL specimens in patients 44 years of age and older), is one component of a comprehensive surveillance program. It is not intended to diagnose infection nor to guide or monitor treatment. Performed at [facility], [address] [place], [place] [address]     RN Pressure Injury Documentation:     Estimated body mass index is 22.67 kg/m as calculated from the following:   Height as of this encounter:   (1.549 m).   Weight as of this encounter: 54.4 kg.  Malnutrition Type:      Malnutrition Characteristics:      Nutrition Interventions:    Radiology Studies: DG Chest 1 View  Result Date: 03/14/2020 CLINICAL DATA:  Shortness of breath EXAM: CHEST  1 VIEW COMPARISON:  03/13/2020 and prior radiographs FINDINGS: Cardiomegaly and CABG changes are again identified. LEFT LOWER lung consolidation/atelectasis and LEFT pleural effusion are again noted. There is no evidence of pneumothorax. There has been little interval change since the prior study. A severe thoracic scoliosis is again noted. IMPRESSION: Unchanged appearance of the chest with LEFT LOWER lung consolidation/atelectasis and LEFT pleural effusion. Electronically Signed   By: Harmon Pier M.D.   On: 03/14/2020 07:39   DG Chest 1 View  Result Date: 03/13/2020 CLINICAL DATA:  Hypoxia, recent hip fracture and repair EXAM: CHEST  1 VIEW COMPARISON:  03/12/2020 FINDINGS: Cardiac shadow is enlarged but stable. Postsurgical changes are again seen. Stable left-sided pleural effusion and likely underlying atelectasis is present. Significant scoliosis is seen. Mild residual vascular congestion remains. IMPRESSION: Overall stable appearance of the chest when compare with the previous exam. Electronically Signed   By: Alcide Clever M.D.   On: 03/13/2020 12:51   DG Chest 1 View  Result Date: 03/12/2020 CLINICAL DATA:  Hypoxia EXAM: CHEST  1 VIEW COMPARISON:  03/09/2020 FINDINGS: Single frontal view of the chest demonstrates marked right convex scoliosis. The cardiac silhouette is enlarged. 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 Since the previous exam, left basilar consolidation and left pleural effusion have developed. There is increased central vascular congestion. No pneumothorax. IMPRESSION: 1. Findings consistent with congestive heart failure, with worsening vascular congestion, left basilar consolidation, and new left pleural effusion.  Electronically Signed   By: Sharlet Salina M.D.   On: 03/12/2020 17:54   Scheduled Meds: . amiodarone  200 mg Oral BID  . aspirin  81 mg Oral Daily  . atorvastatin  40 mg Oral Q supper  . Chlorhexidine Gluconate Cloth  6 each Topical Q0600  . clonazePAM  0.5 mg Oral QID  . docusate sodium  100 mg Oral BID  . escitalopram  5 mg Oral Daily  . ferrous sulfate  325 mg Oral TID PC  . ipratropium  0.5 mg Nebulization Q8H  . levalbuterol  0.63 mg Nebulization Q8H  . metoprolol tartrate  12.5 mg Oral BID  . multivitamin with minerals  1 tablet Oral Daily  . mupirocin ointment  1 application Nasal BID  . omega-3 acid ethyl esters  1 g Oral Q lunch  . psyllium  1 packet Oral Daily  . senna  1 tablet Oral BID  . sodium chloride flush  3 mL Intravenous Q12H  . traMADol  100 mg Oral TID  . vitamin B-12  1,000 mcg Oral Daily   Continuous Infusions: . heparin 1,100 Units/hr (03/13/20 1914)  . methocarbamol (ROBAXIN) IV      LOS: 5 days   Merlene Laughter, DO [facility] PAGER is on AMION  If 7PM-7AM, please contact night-coverage [email or web address]

## 2020-03-14 NOTE — Consult Note (Signed)
ANTICOAGULATION CONSULT NOTE  Pharmacy Consult for apixaban dosing  Indication: atrial fibrillation  Allergies  Allergen Reactions  . Other Other (See Comments)  . Penicillins Other (See Comments)    Has patient had a PCN reaction causing immediate rash, facial/tongue/throat swelling, SOB or lightheadedness with hypotension: Unknown Has patient had a PCN reaction causing severe rash involving mucus membranes or skin necrosis: Unknown Has patient had a PCN reaction that required hospitalization: Unknown Has patient had a PCN reaction occurring within the last 10 years: No If all of the above answers are "NO", then may proceed with Cephalosporin use.  . Sulfa Antibiotics Other (See Comments)    Patient Measurements: Height: 5\' 1"  (154.9 cm) Weight:  (Low bed) IBW/kg (Calculated) : 47.8  Vital Signs: Temp: 99.6 F (37.6 C) (09/26 2017) Temp Source: Oral (09/26 2017) BP: 156/69 (09/26 2017) Pulse Rate: 73 (09/26 2017)  Labs: Recent Labs    03/12/20 0607 03/12/20 03/14/20 03/12/20 1804 03/13/20 0257 03/13/20 0257 03/13/20 1022 03/13/20 1214 03/13/20 1301 03/13/20 2159 03/14/20 0623  HGB 7.2*   < >  --  9.1*  --   --   --   --   --  9.9*  HCT 22.7*  --   --  27.5*  --   --   --   --   --  29.6*  PLT 198  --   --  232  --   --   --   --   --  216  APTT  --   --  37*  --   --   --   --   --   --   --   LABPROT  --   --  14.6  --   --   --   --   --   --   --   INR  --   --  1.2  --   --   --   --   --   --   --   HEPARINUNFRC  --   --   --  0.19*   < >  --   --  0.16* 0.42 0.46  CREATININE 0.62  --   --  0.70  --   --   --   --   --  0.63  TROPONINIHS  --   --  1,523*  --   --  1,093* 827*  --   --   --    < > = values in this interval not displayed.    Estimated Creatinine Clearance: 44.4 mL/min (by C-G formula based on SCr of 0.63 mg/dL).   Medical History: Past Medical History:  Diagnosis Date  . Anxiety   . Depression   . Essential hypertension   .  Hyperlipidemia     Assessment: 79 year old female presenting with fall/inability to walk. PMH includes HTN, HLD, anxiety/depression, and diverticular bleed. On 9/24 pt went into atrial fibrillation with RVR. Pt has been on heparin - pharmacy consulted to switch to apixaban. CBC low stable.    Plan:  Discontinue heparin gtt prior to evening dose of apixaban Apixaban 5 mg BID starting @ 2200 Monitor CBC with AM labs  9/26:  RN confirms heparin gtt stopped at 22:40   Selig Wampole D, PharmD  03/14/2020 10:39 PM

## 2020-03-14 NOTE — Progress Notes (Signed)
Subjective:  POD #4 s/p IM fixation for right IT hip fracture.   Patient reports right hip pain as mild.  Patient seen lying in bed this evening.  She is in no acute distress.  Patient continues to have a regular rhythm today after converting from atrial fibrillation yesterday.  Patient had elevated troponins likely from demand ischemia which is improving and hypokalemia which has been corrected.  Objective:   VITALS:   Vitals:   03/14/20 0912 03/14/20 1143 03/14/20 1502 03/14/20 1622  BP:  (!) 156/61  (!) 147/68  Pulse: 74 62 64 66  Resp:  16 18 14   Temp:  98 F (36.7 C)  98.2 F (36.8 C)  TempSrc:  Oral  Oral  SpO2: 95% 99% 91% 95%  Weight:      Height:        PHYSICAL EXAM: Right lower extremity Neurovascular intact Sensation intact distally Intact pulses distally Dorsiflexion/Plantar flexion intact Incision: scant drainage No cellulitis present Compartment soft  LABS  Results for orders placed or performed during the hospital encounter of 03/09/20 (from the past 24 hour(s))  Lipid panel     Status: Abnormal   Collection Time: 03/13/20  9:59 PM  Result Value Ref Range   Cholesterol 98 0 - 200 mg/dL   Triglycerides 84 03/15/20 mg/dL   HDL 33 (L) <626 mg/dL   Total CHOL/HDL Ratio 3.0 RATIO   VLDL 17 0 - 40 mg/dL   LDL Cholesterol 48 0 - 99 mg/dL  Heparin level (unfractionated)     Status: None   Collection Time: 03/13/20  9:59 PM  Result Value Ref Range   Heparin Unfractionated 0.42 0.30 - 0.70 IU/mL  CBC with Differential/Platelet     Status: Abnormal   Collection Time: 03/14/20  6:23 AM  Result Value Ref Range   WBC 6.8 4.0 - 10.5 K/uL   RBC 3.43 (L) 3.87 - 5.11 MIL/uL   Hemoglobin 9.9 (L) 12.0 - 15.0 g/dL   HCT 03/16/20 (L) 36 - 46 %   MCV 86.3 80.0 - 100.0 fL   MCH 28.9 26.0 - 34.0 pg   MCHC 33.4 30.0 - 36.0 g/dL   RDW 85.4 62.7 - 03.5 %   Platelets 216 150 - 400 K/uL   nRBC 0.0 0.0 - 0.2 %   Neutrophils Relative % 72 %   Neutro Abs 4.9 1.7 - 7.7 K/uL    Lymphocytes Relative 19 %   Lymphs Abs 1.3 0.7 - 4.0 K/uL   Monocytes Relative 7 %   Monocytes Absolute 0.5 0 - 1 K/uL   Eosinophils Relative 1 %   Eosinophils Absolute 0.1 0 - 0 K/uL   Basophils Relative 0 %   Basophils Absolute 0.0 0 - 0 K/uL   Immature Granulocytes 1 %   Abs Immature Granulocytes 0.04 0.00 - 0.07 K/uL  Comprehensive metabolic panel     Status: Abnormal   Collection Time: 03/14/20  6:23 AM  Result Value Ref Range   Sodium 135 135 - 145 mmol/L   Potassium 3.6 3.5 - 5.1 mmol/L   Chloride 99 98 - 111 mmol/L   CO2 28 22 - 32 mmol/L   Glucose, Bld 88 70 - 99 mg/dL   BUN 19 8 - 23 mg/dL   Creatinine, Ser 03/16/20 0.44 - 1.00 mg/dL   Calcium 8.1 (L) 8.9 - 10.3 mg/dL   Total Protein 5.8 (L) 6.5 - 8.1 g/dL   Albumin 2.2 (L) 3.5 - 5.0 g/dL   AST  21 15 - 41 U/L   ALT 12 0 - 44 U/L   Alkaline Phosphatase 62 38 - 126 U/L   Total Bilirubin 0.9 0.3 - 1.2 mg/dL   GFR calc non Af Amer >60 >60 mL/min   GFR calc Af Amer >60 >60 mL/min   Anion gap 8 5 - 15  Magnesium     Status: None   Collection Time: 03/14/20  6:23 AM  Result Value Ref Range   Magnesium 1.8 1.7 - 2.4 mg/dL  Phosphorus     Status: None   Collection Time: 03/14/20  6:23 AM  Result Value Ref Range   Phosphorus 3.0 2.5 - 4.6 mg/dL  TSH     Status: None   Collection Time: 03/14/20  6:23 AM  Result Value Ref Range   TSH 1.369 0.350 - 4.500 uIU/mL  Heparin level (unfractionated)     Status: None   Collection Time: 03/14/20  6:23 AM  Result Value Ref Range   Heparin Unfractionated 0.46 0.30 - 0.70 IU/mL    DG Chest 1 View  Result Date: 03/14/2020 CLINICAL DATA:  Shortness of breath EXAM: CHEST  1 VIEW COMPARISON:  03/13/2020 and prior radiographs FINDINGS: Cardiomegaly and CABG changes are again identified. LEFT LOWER lung consolidation/atelectasis and LEFT pleural effusion are again noted. There is no evidence of pneumothorax. There has been little interval change since the prior study. A severe thoracic  scoliosis is again noted. IMPRESSION: Unchanged appearance of the chest with LEFT LOWER lung consolidation/atelectasis and LEFT pleural effusion. Electronically Signed   By: Harmon PierJeffrey  Hu M.D.   On: 03/14/2020 07:39   DG Chest 1 View  Result Date: 03/13/2020 CLINICAL DATA:  Hypoxia, recent hip fracture and repair EXAM: CHEST  1 VIEW COMPARISON:  03/12/2020 FINDINGS: Cardiac shadow is enlarged but stable. Postsurgical changes are again seen. Stable left-sided pleural effusion and likely underlying atelectasis is present. Significant scoliosis is seen. Mild residual vascular congestion remains. IMPRESSION: Overall stable appearance of the chest when compare with the previous exam. Electronically Signed   By: Alcide CleverMark  Lukens M.D.   On: 03/13/2020 12:51   ECHOCARDIOGRAM COMPLETE  Result Date: 03/14/2020    ECHOCARDIOGRAM REPORT   Patient Name:   Michelle Bauer Date of Exam: 03/13/2020 Medical Rec #:  [id number]      Height:       61.0 in Accession #:    [id number]     Weight:       120.0 lb Date of Birth:  1940-12-01     BSA:          1.520 m Patient Age:    78 years       BP:           158/69 mmHg Patient Gender: F              HR:           79 bpm. Exam Location:  [facility] Procedure: 2D Echo Indications:     Atrial Fibrillation 427.31 / I48.91  History:         Patient has no prior history of Echocardiogram examinations.                  CAD; Risk Factors:Hypertension and Dyslipidemia.  Sonographer:     Johnathan HausenSharika Mucker Referring Phys:  [id number] Antonieta IbaIMOTHY J GOLLAN Diagnosing Phys: Dietrich PatesPaula Ross MD IMPRESSIONS  1. Left ventricular ejection fraction, by estimation, is 65 to 70%. The left ventricle has normal function. The left  ventricle has no regional wall motion abnormalities. Left ventricular diastolic parameters are consistent with Grade I diastolic dysfunction (impaired relaxation).  2. Right ventricular systolic function is normal. The right ventricular size is normal.  3. Left atrial size was mildly dilated.  4. Right atrial  size was mild to moderately dilated.  5. The mitral valve is abnormal. Mild mitral valve regurgitation.  6. The aortic valve is abnormal. Aortic valve regurgitation is mild. Mild aortic valve sclerosis is present, with no evidence of aortic valve stenosis. FINDINGS  Left Ventricle: Left ventricular ejection fraction, by estimation, is 65 to 70%. The left ventricle has normal function. The left ventricle has no regional wall motion abnormalities. The left ventricular internal cavity size was normal in size. There is  no left ventricular hypertrophy. Left ventricular diastolic parameters are consistent with Grade I diastolic dysfunction (impaired relaxation). Right Ventricle: The right ventricular size is normal. Right vetricular wall thickness was not assessed. Right ventricular systolic function is normal. Left Atrium: Left atrial size was mildly dilated. Right Atrium: Right atrial size was mild to moderately dilated. Pericardium: Trivial pericardial effusion is present. Mitral Valve: The mitral valve is abnormal. There is mild thickening of the mitral valve leaflet(s). There is mild calcification of the mitral valve leaflet(s). Mild mitral valve regurgitation. Tricuspid Valve: The tricuspid valve is normal in structure. Tricuspid valve regurgitation is mild. Aortic Valve: The aortic valve is abnormal. Aortic valve regurgitation is mild. Mild aortic valve sclerosis is present, with no evidence of aortic valve stenosis. Pulmonic Valve: The pulmonic valve was normal in structure. Pulmonic valve regurgitation is trivial. Aorta: The aortic root is normal in size and structure. Venous: The inferior vena cava was not well visualized. IAS/Shunts: The interatrial septum was not assessed.  LEFT VENTRICLE PLAX 2D LVIDd:         4.06 cm  Diastology LVIDs:         2.28 cm  LV e' medial:   5.77 cm/s LV PW:         0.81 cm  LV E/e' medial: 16.4 LV IVS:        0.91 cm LVOT diam:     1.90 cm LVOT Area:     2.84 cm  RIGHT VENTRICLE  RV S prime:     14.60 cm/s TAPSE (M-mode): 2.1 cm LEFT ATRIUM             Index       RIGHT ATRIUM           Index LA diam:        4.80 cm 3.16 cm/m  RA Area:     23.20 cm LA Vol (A2C):   67.4 ml 44.34 ml/m RA Volume:   70.90 ml  46.64 ml/m LA Vol (A4C):   58.2 ml 38.29 ml/m LA Biplane Vol: 62.2 ml 40.92 ml/m   AORTA Ao Root diam: 3.60 cm MITRAL VALVE                TRICUSPID VALVE MV Area (PHT): 2.76 cm     TR Peak grad:   49.6 mmHg MV Decel Time: 275 msec     TR Vmax:        352.00 cm/s MV E velocity: 94.90 cm/s MV A velocity: 103.00 cm/s  SHUNTS MV E/A ratio:  0.92         Systemic Diam: 1.90 cm Dietrich Pates MD Electronically signed by Dietrich Pates MD Signature Date/Time: 03/14/2020/3:03:13 PM    Final  Assessment/Plan: 4 Days Post-Op   Active Problems:   Generalized anxiety disorder   HLD (hyperlipidemia)   Fracture   Essential hypertension   Hypokalemia   Acute delirium   Leukocytosis  Patient continues to improve clinically.  Appreciate cardiology following for atrial fibrillation and elevated troponins.  Troponins are improving.  Continue physical therapy as medically appropriate.  Patient is weightbearing as tolerated on the right lower extremity.  Patient will need a walker for assistance with ambulation.  Patient now on Eliquis.   Juanell Fairly , MD 03/14/2020, 6:31 PM

## 2020-03-14 NOTE — Consult Note (Signed)
ANTICOAGULATION CONSULT NOTE  Pharmacy Consult for heparin dosing  Indication: atrial fibrillation  Patient Measurements: Height: 5\' 1"  (154.9 cm) Weight:  (Low bed) IBW/kg (Calculated) : 47.8 Heparin Dosing Weight: 56kg  Labs: Recent Labs    03/12/20 0607 03/12/20 0607 03/12/20 1804 03/13/20 0257 03/13/20 0257 03/13/20 1022 03/13/20 1214 03/13/20 1301 03/13/20 2159 03/14/20 0623  HGB 7.2*   < >  --  9.1*  --   --   --   --   --  9.9*  HCT 22.7*  --   --  27.5*  --   --   --   --   --  29.6*  PLT 198  --   --  232  --   --   --   --   --  216  APTT  --   --  37*  --   --   --   --   --   --   --   LABPROT  --   --  14.6  --   --   --   --   --   --   --   INR  --   --  1.2  --   --   --   --   --   --   --   HEPARINUNFRC  --   --   --  0.19*   < >  --   --  0.16* 0.42 0.46  CREATININE 0.62  --   --  0.70  --   --   --   --   --  0.63  TROPONINIHS  --   --  1,523*  --   --  1,093* 827*  --   --   --    < > = values in this interval not displayed.    Estimated Creatinine Clearance: 44.4 mL/min (by C-G formula based on SCr of 0.63 mg/dL).  Assessment: Patient being started on heparin infusion for afib.  Pt receiving enoxaprin 40mg  for DVT prophylaxis. She received last dose of enoxaprin @1044  today.  Given Hbg of 7.2, discussed with provider whether bolus was needed or indicated. Per discussion with Dr. 02-19-1978, orders to hold initial bolus dose.   9/25 0257 HL 0.19  9/25 1301 HL 0.16  9/25 2159 HL 0.42  Goal of Therapy:  Heparin level 0.3-0.7 units/ml   Plan:  Heparin level is therapeutic x 1. Will continue heparin infusion rate at 1100 unit/hr. Recheck heparin level in 8 hours. CBC daily while on heparin.   9/26:  HL @ 10/25 = 0.46 Will continue this pt on current rate and recheck HL on 9/27 with AM labs.   Kerline Trahan D 03/14/2020,7:27 AM

## 2020-03-14 NOTE — Progress Notes (Signed)
Progress Note  Patient Name: AALAIYAH YASSIN Date of Encounter: 03/14/2020  Primary Cardiologist: Julien Nordmann, MD  Subjective   Breathing is fair   On 4 L O2 (not on at home)  Denies CP    Inpatient Medications    Scheduled Meds: . amiodarone  200 mg Oral BID  . aspirin  81 mg Oral Daily  . atorvastatin  40 mg Oral Q supper  . Chlorhexidine Gluconate Cloth  6 each Topical Q0600  . clonazePAM  0.5 mg Oral QID  . docusate sodium  100 mg Oral BID  . escitalopram  5 mg Oral Daily  . ferrous sulfate  325 mg Oral TID PC  . ipratropium  0.5 mg Nebulization Q8H  . levalbuterol  0.63 mg Nebulization Q8H  . metoprolol tartrate  12.5 mg Oral BID  . multivitamin with minerals  1 tablet Oral Daily  . mupirocin ointment  1 application Nasal BID  . omega-3 acid ethyl esters  1 g Oral Q lunch  . psyllium  1 packet Oral Daily  . senna  1 tablet Oral BID  . sodium chloride flush  3 mL Intravenous Q12H  . traMADol  100 mg Oral TID  . vitamin B-12  1,000 mcg Oral Daily   Continuous Infusions: . heparin 1,100 Units/hr (03/13/20 1914)  . methocarbamol (ROBAXIN) IV     PRN Meds: acetaminophen, alum & mag hydroxide-simeth, bisacodyl, bisacodyl, haloperidol lactate, hydrALAZINE, HYDROcodone-acetaminophen, magnesium citrate, menthol-cetylpyridinium **OR** phenol, methocarbamol **OR** methocarbamol (ROBAXIN) IV, morphine injection, ondansetron **OR** ondansetron (ZOFRAN) IV, polyethylene glycol, promethazine, senna-docusate   Vital Signs    Vitals:   03/13/20 2024 03/14/20 0546 03/14/20 0828 03/14/20 0912  BP: (!) 144/65 (!) 161/60 (!) 170/65   Pulse: 78 66 73 74  Resp: 18 18 18    Temp:  98.4 F (36.9 C) 98.9 F (37.2 C)   TempSrc:  Oral Oral   SpO2: 100% 98% (!) 77% 95%  Weight:      Height:        Intake/Output Summary (Last 24 hours) at 03/14/2020 0930 Last data filed at 03/13/2020 1244 Gross per 24 hour  Intake 120 ml  Output 200 ml  Net -80 ml   Filed Weights   03/09/20  1428 03/12/20 1823  Weight: 56 kg 54.4 kg    Physical Exam   GEN: Well nourished, well developed, in no acute distress.  HEENT: Grossly normal.  Neck: JVP minimally increased  Cardiac: RRR, no murmurs, rubs, or gallops. Respiratory:  Bibasilar crackles. GI: SOft Nontender  MS: no deformity or atrophy. Skin: warm and dry, no rash.   Labs    Chemistry Recent Labs  Lab 03/12/20 0607 03/13/20 0257 03/14/20 0623  NA 136 135 135  K 3.2* 4.3 3.6  CL 102 102 99  CO2 26 27 28   GLUCOSE 82 110* 88  BUN 20 23 19   CREATININE 0.62 0.70 0.63  CALCIUM 7.9* 7.9* 8.1*  PROT 5.5* 5.6* 5.8*  ALBUMIN 2.2* 2.2* 2.2*  AST 19 34 21  ALT 8 15 12   ALKPHOS 47 57 62  BILITOT 0.7 0.8 0.9  GFRNONAA >60 >60 >60  GFRAA >60 >60 >60  ANIONGAP 8 6 8      Hematology Recent Labs  Lab 03/12/20 0607 03/13/20 0257 03/14/20 0623  WBC 7.4 8.3 6.8  RBC 2.61* 3.18* 3.43*  HGB 7.2* 9.1* 9.9*  HCT 22.7* 27.5* 29.6*  MCV 87.0 86.5 86.3  MCH 27.6 28.6 28.9  MCHC 31.7 33.1 33.4  RDW 13.8 13.7 14.0  PLT 198 232 216    Cardiac Enzymes  Recent Labs  Lab 03/12/20 1804 03/13/20 1022 03/13/20 1214  TROPONINIHS 1,523* 1,093* 827*       Lipids  Lab Results  Component Value Date   CHOL 98 03/13/2020   HDL 33 (L) 03/13/2020   LDLCALC 48 03/13/2020   TRIG 84 03/13/2020   CHOLHDL 3.0 03/13/2020    HbA1c  Lab Results  Component Value Date   HGBA1C 5.3 03/11/2020    Radiology    DG Chest 1 View  Result Date: 03/14/2020 CLINICAL DATA:  Shortness of breath EXAM: CHEST  1 VIEW COMPARISON:  03/13/2020 and prior radiographs FINDINGS: Cardiomegaly and CABG changes are again identified. LEFT LOWER lung consolidation/atelectasis and LEFT pleural effusion are again noted. There is no evidence of pneumothorax. There has been little interval change since the prior study. A severe thoracic scoliosis is again noted. IMPRESSION: Unchanged appearance of the chest with LEFT LOWER lung  consolidation/atelectasis and LEFT pleural effusion. Electronically Signed   By: Harmon Pier M.D.   On: 03/14/2020 07:39   DG Chest 1 View  Result Date: 03/13/2020 CLINICAL DATA:  Hypoxia, recent hip fracture and repair EXAM: CHEST  1 VIEW COMPARISON:  03/12/2020 FINDINGS: Cardiac shadow is enlarged but stable. Postsurgical changes are again seen. Stable left-sided pleural effusion and likely underlying atelectasis is present. Significant scoliosis is seen. Mild residual vascular congestion remains. IMPRESSION: Overall stable appearance of the chest when compare with the previous exam. Electronically Signed   By: Alcide Clever M.D.   On: 03/13/2020 12:51   DG Chest 1 View  Result Date: 03/12/2020 CLINICAL DATA:  Hypoxia EXAM: CHEST  1 VIEW COMPARISON:  03/09/2020 FINDINGS: Single frontal view of the chest demonstrates marked right convex scoliosis. The cardiac silhouette is enlarged. 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 Since the previous exam, left basilar consolidation and left pleural effusion have developed. There is increased central vascular congestion. No pneumothorax. IMPRESSION: 1. Findings consistent with congestive heart failure, with worsening vascular congestion, left basilar consolidation, and new left pleural effusion. Electronically Signed   By: Sharlet Salina M.D.   On: 03/12/2020 17:54   DG HIP OPERATIVE UNILAT W OR W/O PELVIS RIGHT  Result Date: 03/10/2020 CLINICAL DATA:  Right hip fracture EXAM: OPERATIVE right HIP (WITH PELVIS IF PERFORMED) 16 VIEWS TECHNIQUE: Fluoroscopic spot image(s) were submitted for interpretation post-operatively. COMPARISON:  03/10/2020 FINDINGS: C-arm images demonstrate fixation of right intertrochanteric fracture with compression screw and locking intramedullary rod extending into the distal femur. Fracture alignment satisfactory IMPRESSION: Right intertrochanteric fracture ORIF. Electronically Signed   By: Marlan Palau M.D.   On: 03/10/2020  16:44   DG FEMUR, MIN 2 VIEWS RIGHT  Result Date: 03/10/2020 CLINICAL DATA:  Postop intramedullary nail for fracture EXAM: RIGHT FEMUR 2 VIEWS COMPARISON:  03/09/2020 FINDINGS: Intertrochanteric fracture has been fixed with a screw and locking intramedullary rod. Hardware in good position. Fracture alignment satisfactory. IMPRESSION: ORIF intertrochanteric fracture on the right. Electronically Signed   By: Marlan Palau M.D.   On: 03/10/2020 16:19    Telemetry    Converted to sinus rhythm w/ rates in 60's @ 21:47 on 9/24. - Personally Reviewed  Cardiac Studies   Echo  1. Left ventricular ejection fraction, by estimation, is 65 to 70%. The left ventricle has normal function. The left ventricle has no regional wall motion abnormalities. Left ventricular diastolic parameters are consistent with Grade I diastolic dysfunction (impaired relaxation). 2.  Right ventricular systolic function is normal. The right ventricular size is normal. 3. Left atrial size was mildly dilated. 4. Right atrial size was mild to moderately dilated. 5. The mitral valve is abnormal. Mild mitral valve regurgitation. 6. The aortic valve is abnormal. Aortic valve regurgitation is mild. Mild aortic valve sclerosis is present, with no evidence of aortic valve stenosis.  Patient Profile     79 y.o. female with a hx of hypertension, hyperlipidemia, severe scoliosis, possible dementia presenting to the hospital 9/21 after mechanical fall, hip fracture, post surgery developing atrial fibrillation with RVR.  Assessment & Plan    1. Afib w/ RVR:  Developed 9/24 in setting of R hip fx s/p IM nail on 9/23, hypokalemia, and anemia.  Rates up to 160's. She did not respond to IV dilt and so amio was started w/ conversion to sinus rhythm CHA2DS2VASc = 4, currently on heparin.  Echo above  Normal LVEF / RVEF  Atrial mildly dilated  Plan for amiodarone 200 bid po and Eliquis  Will be followed as outpt to see if maintains SR    If  does stop Amio first   Follow then Eliquis possibly    Reviewed with family  Watch for falls  2.  R intertrochanteric hip fracture: s/p intramedullary nail 9/23. Per ortho.  3.  Acute diastolic CHF:  In setting of hypotension and volume repletion.   Agree with lasix iv x 1 (40 mg)  Follow   Pt on O2 now  Did not use prior to admit    4.  Demand ischemia:  HsT 1523 on 9/24 in setting of rapid Afib. No angina   Echo LVEF and RVEF normal    5.   ABL anemia:   Hgb 9.9  Follow on Eliquis    6  Dementia  Signed, Dietrich Pates, MD  03/14/2020, 9:30 AM      For questions or updates, please contact   Please consult [email or web address] for contact info under Cardiology/STEMI.

## 2020-03-14 NOTE — Progress Notes (Signed)
Physical Therapy Treatment Patient Details Name: Michelle Bauer MRN: [id number] DOB: 31-Mar-1941 Today's Date: 03/14/2020    History of Present Illness Pt is a 79 y.o. female with medical history significant of 79 year old female with past medical history of hypertension, hyperlipidemia, anxiety/depression, severe scoliosis, diverticular bleed presented status post fall.  Patient was on her usual health and her daughter was coming to visit her.  She went to unlock to double lock since her daughter did not have the key to the door and she reports a chair was pulled out in the kitchen and somehow she stumbled over it and fell.  She did hit her head against a wall.  Daughter arrived 30 minutes later meanwhile patient was on the floor.  Pt initially refused to come to the ER, but could not walk.  Found to have R hip fx and is now s/p ORIF 9/22.    PT Comments    Participated in exercises as described below in supine and sitting.  Heparin running for a-fib during session.  Given assistance level, session was limited to sitting EOB today for safety with Heparin.  She is able to sit x 10 minutes with occasional min a x 1 but mostly min guard.  Worked on scooting right in sitting to move towards HOB.  She is able to make several small movements on her own but need min/mod a x 1 to complete task.  Breakfast arrived during session and she is anxious to return to supine to eat.  Assisted with max a x 1 to return to supine.    Of note.  Pt had removed O2 and tech in on arrival addressing.  .  Sats low 80's.  O2 replaced and sats increased and remained in mid to high 90's with session.  4 lpm.   Follow Up Recommendations  SNF     Equipment Recommendations       Recommendations for Other Services       Precautions / Restrictions Precautions Precautions: Fall Restrictions Weight Bearing Restrictions: No RLE Weight Bearing: Weight bearing as tolerated    Mobility  Bed Mobility Overal bed mobility:  Needs Assistance Bed Mobility: Supine to Sit;Sit to Supine     Supine to sit: Mod assist Sit to supine: Mod assist;Max assist   General bed mobility comments: cues and increased time but continues to need significant assist  Transfers                 General transfer comment: deferred due to Haparin running and fall risk  Ambulation/Gait                 Stairs             Wheelchair Mobility    Modified Rankin (Stroke Patients Only)       Balance Overall balance assessment: Needs assistance Sitting-balance support: Bilateral upper extremity supported;Feet supported Sitting balance-Leahy Scale: Fair Sitting balance - Comments: occasional min assist but generally min gaurd x 10 minutes                                    Cognition Arousal/Alertness: Awake/alert Behavior During Therapy: WFL for tasks assessed/performed Overall Cognitive Status: Impaired/Different from baseline  Exercises Other Exercises Other Exercises: supine and seated hip protocols x 10    General Comments        Pertinent Vitals/Pain Pain Assessment: Faces Faces Pain Scale: Hurts a little bit Pain Location: RLE Pain Descriptors / Indicators: Sore;Operative site guarding Pain Intervention(s): Limited activity within patient's tolerance;Monitored during session;Repositioned    Home Living                      Prior Function            PT Goals (current goals can now be found in the care plan section) Progress towards PT goals: Progressing toward goals    Frequency    BID      PT Plan Current plan remains appropriate    Co-evaluation              AM-PAC PT "6 Clicks" Mobility   Outcome Measure  Help needed turning from your back to your side while in a flat bed without using bedrails?: A Lot Help needed moving from lying on your back to sitting on the side of a flat bed  without using bedrails?: A Lot Help needed moving to and from a bed to a chair (including a wheelchair)?: A Lot Help needed standing up from a chair using your arms (e.g., wheelchair or bedside chair)?: A Lot Help needed to walk in hospital room?: A Lot Help needed climbing 3-5 steps with a railing? : Total 6 Click Score: 11    End of Session Equipment Utilized During Treatment: Oxygen Activity Tolerance: Patient tolerated treatment well Patient left: in bed;with call bell/phone within reach;with bed alarm set;with nursing/sitter in room;with family/visitor present Nurse Communication: Mobility status Pain - Right/Left: Right Pain - part of body: Hip     Time: 6063-0160 PT Time Calculation (min) (ACUTE ONLY): 24 min  Charges:  $Therapeutic Exercise: 8-22 mins $Therapeutic Activity: 8-22 mins                    Danielle Dess, PTA 03/14/20, 9:51 AM

## 2020-03-14 NOTE — Consult Note (Signed)
ANTICOAGULATION CONSULT NOTE  Pharmacy Consult for apixaban dosing  Indication: atrial fibrillation  Allergies  Allergen Reactions  . Other Other (See Comments)  . Penicillins Other (See Comments)    Has patient had a PCN reaction causing immediate rash, facial/tongue/throat swelling, SOB or lightheadedness with hypotension: Unknown Has patient had a PCN reaction causing severe rash involving mucus membranes or skin necrosis: Unknown Has patient had a PCN reaction that required hospitalization: Unknown Has patient had a PCN reaction occurring within the last 10 years: No If all of the above answers are "NO", then may proceed with Cephalosporin use.  . Sulfa Antibiotics Other (See Comments)    Patient Measurements: Height: 5\' 1"  (154.9 cm) Weight:  (Low bed) IBW/kg (Calculated) : 47.8  Vital Signs: Temp: 98 F (36.7 C) (09/26 1143) Temp Source: Oral (09/26 1143) BP: 156/61 (09/26 1143) Pulse Rate: 62 (09/26 1143)  Labs: Recent Labs    03/12/20 0607 03/12/20 03/14/20 03/12/20 1804 03/13/20 0257 03/13/20 0257 03/13/20 1022 03/13/20 1214 03/13/20 1301 03/13/20 2159 03/14/20 0623  HGB 7.2*   < >  --  9.1*  --   --   --   --   --  9.9*  HCT 22.7*  --   --  27.5*  --   --   --   --   --  29.6*  PLT 198  --   --  232  --   --   --   --   --  216  APTT  --   --  37*  --   --   --   --   --   --   --   LABPROT  --   --  14.6  --   --   --   --   --   --   --   INR  --   --  1.2  --   --   --   --   --   --   --   HEPARINUNFRC  --   --   --  0.19*   < >  --   --  0.16* 0.42 0.46  CREATININE 0.62  --   --  0.70  --   --   --   --   --  0.63  TROPONINIHS  --   --  1,523*  --   --  1,093* 827*  --   --   --    < > = values in this interval not displayed.    Estimated Creatinine Clearance: 44.4 mL/min (by C-G formula based on SCr of 0.63 mg/dL).   Medical History: Past Medical History:  Diagnosis Date  . Anxiety   . Depression   . Essential hypertension   . Hyperlipidemia      Assessment: 79 year old female presenting with fall/inability to walk. PMH includes HTN, HLD, anxiety/depression, and diverticular bleed. On 9/24 pt went into atrial fibrillation with RVR. Pt has been on heparin - pharmacy consulted to switch to apixaban. CBC low stable.    Plan:  Discontinue heparin gtt prior to evening dose of apixaban Apixaban 5 mg BID starting @ 2200 Monitor CBC with AM labs  10/24, PharmD Pharmacy Resident  03/14/2020 2:57 PM

## 2020-03-15 ENCOUNTER — Inpatient Hospital Stay: Payer: Medicare Other

## 2020-03-15 DIAGNOSIS — J189 Pneumonia, unspecified organism: Secondary | ICD-10-CM

## 2020-03-15 DIAGNOSIS — I4891 Unspecified atrial fibrillation: Secondary | ICD-10-CM

## 2020-03-15 LAB — CBC WITH DIFFERENTIAL/PLATELET
Abs Immature Granulocytes: 0.07 10*3/uL (ref 0.00–0.07)
Basophils Absolute: 0 10*3/uL (ref 0.0–0.1)
Basophils Relative: 0 %
Eosinophils Absolute: 0.1 10*3/uL (ref 0.0–0.5)
Eosinophils Relative: 1 %
HCT: 28.3 % — ABNORMAL LOW (ref 36.0–46.0)
Hemoglobin: 9.6 g/dL — ABNORMAL LOW (ref 12.0–15.0)
Immature Granulocytes: 1 %
Lymphocytes Relative: 22 %
Lymphs Abs: 1.6 10*3/uL (ref 0.7–4.0)
MCH: 29.1 pg (ref 26.0–34.0)
MCHC: 33.9 g/dL (ref 30.0–36.0)
MCV: 85.8 fL (ref 80.0–100.0)
Monocytes Absolute: 0.6 10*3/uL (ref 0.1–1.0)
Monocytes Relative: 8 %
Neutro Abs: 4.8 10*3/uL (ref 1.7–7.7)
Neutrophils Relative %: 68 %
Platelets: 244 10*3/uL (ref 150–400)
RBC: 3.3 MIL/uL — ABNORMAL LOW (ref 3.87–5.11)
RDW: 14.4 % (ref 11.5–15.5)
WBC: 7.2 10*3/uL (ref 4.0–10.5)
nRBC: 0 % (ref 0.0–0.2)

## 2020-03-15 LAB — COMPREHENSIVE METABOLIC PANEL
ALT: 13 U/L (ref 0–44)
AST: 22 U/L (ref 15–41)
Albumin: 2.1 g/dL — ABNORMAL LOW (ref 3.5–5.0)
Alkaline Phosphatase: 57 U/L (ref 38–126)
Anion gap: 8 (ref 5–15)
BUN: 18 mg/dL (ref 8–23)
CO2: 31 mmol/L (ref 22–32)
Calcium: 8.1 mg/dL — ABNORMAL LOW (ref 8.9–10.3)
Chloride: 96 mmol/L — ABNORMAL LOW (ref 98–111)
Creatinine, Ser: 0.77 mg/dL (ref 0.44–1.00)
GFR calc Af Amer: >60 mL/min (ref >60)
GFR calc non Af Amer: >60 mL/min (ref >60)
Glucose, Bld: 87 mg/dL (ref 70–99)
Potassium: 3.2 mmol/L — ABNORMAL LOW (ref 3.5–5.1)
Sodium: 135 mmol/L (ref 135–145)
Total Bilirubin: 0.8 mg/dL (ref 0.3–1.2)
Total Protein: 5.5 g/dL — ABNORMAL LOW (ref 6.5–8.1)

## 2020-03-15 LAB — PHOSPHORUS: Phosphorus: 4.1 mg/dL (ref 2.5–4.6)

## 2020-03-15 LAB — MAGNESIUM: Magnesium: 1.7 mg/dL (ref 1.7–2.4)

## 2020-03-15 MED ORDER — SODIUM CHLORIDE 0.9 % IV SOLN: 500.0000 mg | INTRAVENOUS | Status: DC

## 2020-03-15 MED ORDER — LEVALBUTEROL HCL 0.63 MG/3ML IN NEBU: 0.6300 mg | INHALATION_SOLUTION | Freq: Four times a day (QID) | RESPIRATORY_TRACT | Status: DC | PRN

## 2020-03-15 MED ORDER — POTASSIUM CHLORIDE CRYS ER 20 MEQ PO TBCR
40.0000 meq | EXTENDED_RELEASE_TABLET | Freq: Two times a day (BID) | ORAL | Status: AC
  Administered 2020-03-15 (×2): 40 meq via ORAL
  Filled 2020-03-15 (×2): qty 2

## 2020-03-15 MED ORDER — SODIUM CHLORIDE 0.9 % IV SOLN
100.0000 mg | Freq: Two times a day (BID) | INTRAVENOUS | Status: DC
  Administered 2020-03-15 – 2020-03-17 (×5): 100 mg via INTRAVENOUS
  Filled 2020-03-15 (×7): qty 100

## 2020-03-15 MED ORDER — IPRATROPIUM BROMIDE 0.02 % IN SOLN: 0.5000 mg | Freq: Four times a day (QID) | RESPIRATORY_TRACT | Status: DC | PRN

## 2020-03-15 MED ORDER — MAGNESIUM SULFATE 2 GM/50ML IV SOLN
2.0000 g | Freq: Once | INTRAVENOUS | Status: AC
  Administered 2020-03-15: 2 g via INTRAVENOUS
  Filled 2020-03-15: qty 50

## 2020-03-15 MED ORDER — FUROSEMIDE 10 MG/ML IJ SOLN
40.0000 mg | Freq: Once | INTRAMUSCULAR | Status: AC
  Administered 2020-03-15: 40 mg via INTRAVENOUS
  Filled 2020-03-15: qty 4

## 2020-03-15 MED ORDER — SODIUM CHLORIDE 0.9 % IV SOLN
1.0000 g | INTRAVENOUS | Status: DC
  Administered 2020-03-15 – 2020-03-17 (×3): 1 g via INTRAVENOUS
  Filled 2020-03-15: qty 1
  Filled 2020-03-15 (×2): qty 10
  Filled 2020-03-15: qty 1

## 2020-03-15 MED ORDER — GUAIFENESIN ER 600 MG PO TB12
1200.0000 mg | ORAL_TABLET | Freq: Two times a day (BID) | ORAL | Status: DC
  Administered 2020-03-15 – 2020-03-17 (×5): 1200 mg via ORAL
  Filled 2020-03-15 (×5): qty 2

## 2020-03-15 NOTE — Progress Notes (Signed)
Physical Therapy Treatment Patient Details Name: Michelle Bauer MRN: [id number] DOB: Oct 13, 1940 Today's Date: 03/15/2020    History of Present Illness Pt is a 79 y.o. female with medical history significant of 79 year old female with past medical history of hypertension, hyperlipidemia, anxiety/depression, severe scoliosis, diverticular bleed presented status post fall.  Patient was on her usual health and her daughter was coming to visit her.  She went to unlock to double lock since her daughter did not have the key to the door and she reports a chair was pulled out in the kitchen and somehow she stumbled over it and fell.  She did hit her head against a wall.  Daughter arrived 30 minutes later meanwhile patient was on the floor.  Pt initially refused to come to the ER, but could not walk.  Found to have R hip fx and is now s/p ORIF 9/22.    PT Comments    Pt was pleasant and motivated to participate during the session. Pt seemed less cognitively present than in the AM session. Pt required significant cueing to perform simple exercises and functional mobility, apologized for "being bad", and remarked multiple times that she "the pain is because I'm bad". Behavior was WNL and pt demonstrated normal memory ability. Pt demonstrates a significant L lateral lean in both sitting and standing. Pt could correct with max VCs and tactile cues but would soon shift towards the L again. The pt was able to STS with MinA. Pt was able to ambulate 7' but required max cuing for sequencing and ModA for balance. Pt will benefit from PT services in a SNF setting upon discharge to safely address deficits listed in patient problem list for decreased caregiver assistance and eventual return to PLOF.   Follow Up Recommendations  SNF     Equipment Recommendations  Other (comment) TBD at next level of care   Recommendations for Other Services       Precautions / Restrictions Precautions Precautions:  Fall Restrictions Weight Bearing Restrictions: Yes RLE Weight Bearing: Weight bearing as tolerated    Mobility  Bed Mobility Overal bed mobility: Needs Assistance Bed Mobility: Supine to Sit     Supine to sit: Mod assist     General bed mobility comments: not assessed in this session  Transfers Overall transfer level: Needs assistance Equipment used: Rolling walker (2 wheeled) Transfers: Sit to/from Stand Sit to Stand: Min assist Stand pivot transfers: Mod assist       General transfer comment: pt needs more help for transfers than in AM session. pt places less weight through R LE and needs max cueing to correct  Ambulation/Gait Ambulation/Gait assistance: Mod assist Gait Distance (Feet): 7 Feet, 3 Feet Assistive device: Rolling walker (2 wheeled) Gait Pattern/deviations: Decreased weight shift to right;Trunk flexed;Step-to pattern;Decreased stance time - right Gait velocity: decreased   General Gait Details: pt needs MinA therapist support and Max cueing to shift weight towards R side. pt needed ModA cues for walker sequencing.   Stairs             Wheelchair Mobility    Modified Rankin (Stroke Patients Only)       Balance Overall balance assessment: Needs assistance Sitting-balance support: Feet supported;Single extremity supported Sitting balance-Leahy Scale: Fair Sitting balance - Comments: pt able to keep balance for quiet sitting Postural control: Left lateral lean Standing balance support: Bilateral upper extremity supported Standing balance-Leahy Scale: Poor Standing balance comment: pt requires bilateral UE and min therapist support to remain standing.  pt demonstrates kyphotic posture and left lateral lean with decreased weight shift towards the R. Pt needs Max VCs to shift towards R                            Cognition Arousal/Alertness: Awake/alert Behavior During Therapy: WFL for tasks assessed/performed Overall Cognitive  Status: Impaired/Different from baseline                                 General Comments: pt awake and alert but slow to answer to commands and questions. pt believes that pain is because she "has been bad". Daughter has concerns that pt has signs of dementia      Exercises Total Joint Exercises Ankle Circles/Pumps: AROM;Both;10 reps Towel Squeeze: AROM;Both;10 reps Long Arc Quad: AROM;Both;10 reps Other Exercises Other Exercises: pt performs forward trunk lean towards PT's hands while seated x 10. pt performs forwards trunk lean with R bias to facilitate weight shift x 6    General Comments        Pertinent Vitals/Pain Pain Assessment: No/denies pain Pain Score: 0-No pain Faces Pain Scale: Hurts even more Pain Location: no pain at rest in R LE at rest but pain with WB. pt has burning pain in arm where IV was placed. nurse notified Pain Descriptors / Indicators: Burning Pain Intervention(s): Limited activity within patient's tolerance;Monitored during session;Repositioned    Home Living                      Prior Function            PT Goals (current goals can now be found in the care plan section) Acute Rehab PT Goals Patient Stated Goal: go home PT Goal Formulation: With patient Time For Goal Achievement: 03/26/20 Potential to Achieve Goals: Fair Progress towards PT goals: Progressing toward goals    Frequency    Min 2X/week      PT Plan Current plan remains appropriate    Co-evaluation              AM-PAC PT "6 Clicks" Mobility   Outcome Measure  Help needed turning from your back to your side while in a flat bed without using bedrails?: A Lot Help needed moving from lying on your back to sitting on the side of a flat bed without using bedrails?: A Lot Help needed moving to and from a bed to a chair (including a wheelchair)?: A Lot Help needed standing up from a chair using your arms (e.g., wheelchair or bedside chair)?: A  Little Help needed to walk in hospital room?: A Lot Help needed climbing 3-5 steps with a railing? : Total 6 Click Score: 12    End of Session Equipment Utilized During Treatment: Gait belt;Oxygen Activity Tolerance: Patient tolerated treatment well Patient left: with family/visitor present (pt left on [facility] with daughter. Nurses notified and standing by to help) Nurse Communication: Mobility status PT Visit Diagnosis: Muscle weakness (generalized) (M62.81);Difficulty in walking, not elsewhere classified (R26.2);Pain Pain - Right/Left: Right Pain - part of body: Hip     Time: 0092-3300 PT Time Calculation (min) (ACUTE ONLY): 40 min  Charges:  $Therapeutic Exercise: 8-22 mins $Therapeutic Activity: 8-22 mins                     Nicolette Bang, SPT 03/15/20. 3:30 PM

## 2020-03-15 NOTE — Progress Notes (Signed)
Progress Note  Patient Name: Michelle Bauer Date of Encounter: 03/15/2020  Primary Cardiologist: Julien Nordmann, MD   Subjective   Pain, racing heart rate, or palpitations.  Overall, she reports feeling improved when compared with yesterday.  She reports an intermittent cough and associated shortness of breath (at time of cough), which started during her admission.  No DOE.  She expresses some anxiety regarding going to a SNF.  She states that she has been followed in the past and always had someone to live with, and she worries about her living situation after this admission.  Her husband reportedly passed away 7 years ago.  She has 2 daughters; however, 1 daughter has 2 teenagers with active extracurriculars.  The other daughter is working with her husband to maintain a small business.  Inpatient Medications    Scheduled Meds: . amiodarone  200 mg Oral BID  . apixaban  5 mg Oral BID  . aspirin  81 mg Oral Daily  . atorvastatin  40 mg Oral Q supper  . clonazePAM  0.5 mg Oral QID  . docusate sodium  100 mg Oral BID  . escitalopram  5 mg Oral Daily  . ferrous sulfate  325 mg Oral TID PC  . guaiFENesin  1,200 mg Oral BID  . influenza vaccine adjuvanted  0.5 mL Intramuscular Tomorrow-1000  . metoprolol tartrate  12.5 mg Oral BID  . multivitamin with minerals  1 tablet Oral Daily  . omega-3 acid ethyl esters  1 g Oral Q lunch  . potassium chloride  40 mEq Oral BID  . psyllium  1 packet Oral Daily  . senna  1 tablet Oral BID  . sodium chloride flush  3 mL Intravenous Q12H  . traMADol  100 mg Oral TID  . vitamin B-12  1,000 mcg Oral Daily   Continuous Infusions: . cefTRIAXone (ROCEPHIN)  IV 1 g (03/15/20 1125)  . doxycycline (VIBRAMYCIN) IV 100 mg (03/15/20 1239)  . methocarbamol (ROBAXIN) IV     PRN Meds: acetaminophen, alum & mag hydroxide-simeth, bisacodyl, bisacodyl, haloperidol lactate, hydrALAZINE, HYDROcodone-acetaminophen, ipratropium, levalbuterol, magnesium  citrate, menthol-cetylpyridinium **OR** phenol, methocarbamol **OR** methocarbamol (ROBAXIN) IV, morphine injection, ondansetron **OR** ondansetron (ZOFRAN) IV, polyethylene glycol, promethazine, senna-docusate   Vital Signs    Vitals:   03/14/20 2152 03/15/20 0416 03/15/20 0717 03/15/20 1143  BP:  (!) 142/66 (!) 147/55 (!) 103/56  Pulse:  63 64 60  Resp:  16 16 17   Temp:  98 F (36.7 C) 97.6 F (36.4 C) 98.3 F (36.8 C)  TempSrc:  Oral Oral   SpO2: 90% 98% 96% 99%  Weight:      Height:        Intake/Output Summary (Last 24 hours) at 03/15/2020 1404 Last data filed at 03/14/2020 2145 Gross per 24 hour  Intake --  Output 500 ml  Net -500 ml   Last 3 Weights 03/13/2020 03/12/2020 03/09/2020  Weight (lbs) (No Data) 120 lb 123 lb 7.3 oz  Weight (kg) (No Data) 54.432 kg 56 kg  Some encounter information is confidential and restricted. Go to Review Flowsheets activity to see all data.      Telemetry    NSR, 6 beat run of NSVT at 18: 57 yesterday.  While in NSR, rate 60s to 70s with intermittent PVCs and rare PACs.- Personally Reviewed  ECG    No new tracings - Personally Reviewed  Physical Exam   GEN: No acute distress.  Sitting in her recliner.  Neck: No  JVD Cardiac: RRR, 1/6 systolic murmur. No rubs, or gallops.  Respiratory: Clear to auscultation bilaterally, on [place] oxygen, reduced bilateral breath sounds. GI: Soft, nontender, non-distended  MS: No edema; No deformity. Neuro:  Nonfocal  Psych: Normal affect   Labs    High Sensitivity Troponin:   Recent Labs  Lab 03/12/20 1804 03/13/20 1022 03/13/20 1214  TROPONINIHS 1,523* 1,093* 827*      Chemistry Recent Labs  Lab 03/13/20 0257 03/14/20 0623 03/15/20 0513  NA 135 135 135  K 4.3 3.6 3.2*  CL 102 99 96*  CO2 27 28 31   GLUCOSE 110* 88 87  BUN 23 19 18   CREATININE 0.70 0.63 0.77  CALCIUM 7.9* 8.1* 8.1*  PROT 5.6* 5.8* 5.5*  ALBUMIN 2.2* 2.2* 2.1*  AST 34 21 22  ALT 15 12 13   ALKPHOS 57 62 57   BILITOT 0.8 0.9 0.8  GFRNONAA >60 >60 >60  GFRAA >60 >60 >60  ANIONGAP 6 8 8      Hematology Recent Labs  Lab 03/13/20 0257 03/14/20 0623 03/15/20 0513  WBC 8.3 6.8 7.2  RBC 3.18* 3.43* 3.30*  HGB 9.1* 9.9* 9.6*  HCT 27.5* 29.6* 28.3*  MCV 86.5 86.3 85.8  MCH 28.6 28.9 29.1  MCHC 33.1 33.4 33.9  RDW 13.7 14.0 14.4  PLT 232 216 244    BNP Recent Labs  Lab 03/13/20 0257  BNP 477.4*     DDimer No results for input(s): DDIMER in the last 168 hours.   Radiology    DG Chest 1 View  Result Date: 03/15/2020 CLINICAL DATA:  Shortness of breath EXAM: CHEST  1 VIEW COMPARISON:  03/14/2020 FINDINGS: Postoperative changes in the mediastinum. Diffuse cardiac enlargement. No vascular congestion. Bilateral perihilar infiltration or atelectasis. Consolidation in the left base with small left pleural effusion. Prominent thoracic scoliosis convex towards the right. IMPRESSION: Cardiac enlargement. Bilateral perihilar infiltration or atelectasis with consolidation in the left base and small left pleural effusion. Similar appearance to previous study. Electronically Signed   By: 03/17/20 M.D.   On: 03/15/2020 05:50   DG Chest 1 View  Result Date: 03/14/2020 CLINICAL DATA:  Shortness of breath EXAM: CHEST  1 VIEW COMPARISON:  03/13/2020 and prior radiographs FINDINGS: Cardiomegaly and CABG changes are again identified. LEFT LOWER lung consolidation/atelectasis and LEFT pleural effusion are again noted. There is no evidence of pneumothorax. There has been little interval change since the prior study. A severe thoracic scoliosis is again noted. IMPRESSION: Unchanged appearance of the chest with LEFT LOWER lung consolidation/atelectasis and LEFT pleural effusion. Electronically Signed   By: Burman Nieves M.D.   On: 03/14/2020 07:39   ECHOCARDIOGRAM COMPLETE  Result Date: 03/14/2020    ECHOCARDIOGRAM REPORT   Patient Name:   Michelle Bauer Date of Exam: 03/13/2020 Medical Rec #:  03/16/2020       Height:       61.0 in Accession #:    03/16/2020     Weight:       120.0 lb Date of Birth:  [birth year]     BSA:          1.520 m Patient Age:    78 years       BP:           158/69 mmHg Patient Gender: F              HR:           79 bpm. Exam Location:  [facility] Procedure:  2D Echo Indications:     Atrial Fibrillation 427.31 / I48.91  History:         Patient has no prior history of Echocardiogram examinations.                  CAD; Risk Factors:Hypertension and Dyslipidemia.  Sonographer:     Johnathan Hausen Referring Phys:  [id number] Antonieta Iba Diagnosing Phys: Dietrich Pates MD IMPRESSIONS  1. Left ventricular ejection fraction, by estimation, is 65 to 70%. The left ventricle has normal function. The left ventricle has no regional wall motion abnormalities. Left ventricular diastolic parameters are consistent with Grade I diastolic dysfunction (impaired relaxation).  2. Right ventricular systolic function is normal. The right ventricular size is normal.  3. Left atrial size was mildly dilated.  4. Right atrial size was mild to moderately dilated.  5. The mitral valve is abnormal. Mild mitral valve regurgitation.  6. The aortic valve is abnormal. Aortic valve regurgitation is mild. Mild aortic valve sclerosis is present, with no evidence of aortic valve stenosis. FINDINGS  Left Ventricle: Left ventricular ejection fraction, by estimation, is 65 to 70%. The left ventricle has normal function. The left ventricle has no regional wall motion abnormalities. The left ventricular internal cavity size was normal in size. There is  no left ventricular hypertrophy. Left ventricular diastolic parameters are consistent with Grade I diastolic dysfunction (impaired relaxation). Right Ventricle: The right ventricular size is normal. Right vetricular wall thickness was not assessed. Right ventricular systolic function is normal. Left Atrium: Left atrial size was mildly dilated. Right Atrium: Right atrial size was mild to moderately  dilated. Pericardium: Trivial pericardial effusion is present. Mitral Valve: The mitral valve is abnormal. There is mild thickening of the mitral valve leaflet(s). There is mild calcification of the mitral valve leaflet(s). Mild mitral valve regurgitation. Tricuspid Valve: The tricuspid valve is normal in structure. Tricuspid valve regurgitation is mild. Aortic Valve: The aortic valve is abnormal. Aortic valve regurgitation is mild. Mild aortic valve sclerosis is present, with no evidence of aortic valve stenosis. Pulmonic Valve: The pulmonic valve was normal in structure. Pulmonic valve regurgitation is trivial. Aorta: The aortic root is normal in size and structure. Venous: The inferior vena cava was not well visualized. IAS/Shunts: The interatrial septum was not assessed.  LEFT VENTRICLE PLAX 2D LVIDd:         4.06 cm  Diastology LVIDs:         2.28 cm  LV e' medial:   5.77 cm/s LV PW:         0.81 cm  LV E/e' medial: 16.4 LV IVS:        0.91 cm LVOT diam:     1.90 cm LVOT Area:     2.84 cm  RIGHT VENTRICLE RV S prime:     14.60 cm/s TAPSE (M-mode): 2.1 cm LEFT ATRIUM             Index       RIGHT ATRIUM           Index LA diam:        4.80 cm 3.16 cm/m  RA Area:     23.20 cm LA Vol (A2C):   67.4 ml 44.34 ml/m RA Volume:   70.90 ml  46.64 ml/m LA Vol (A4C):   58.2 ml 38.29 ml/m LA Biplane Vol: 62.2 ml 40.92 ml/m   AORTA Ao Root diam: 3.60 cm MITRAL VALVE  TRICUSPID VALVE MV Area (PHT): 2.76 cm     TR Peak grad:   49.6 mmHg MV Decel Time: 275 msec     TR Vmax:        352.00 cm/s MV E velocity: 94.90 cm/s MV A velocity: 103.00 cm/s  SHUNTS MV E/A ratio:  0.92         Systemic Diam: 1.90 cm Dietrich Pates MD Electronically signed by Dietrich Pates MD Signature Date/Time: 03/14/2020/3:03:13 PM    Final     Cardiac Studies   Echo 03/13/20 1. Left ventricular ejection fraction, by estimation, is 65 to 70%. The left ventricle has normal function. The left ventricle has no regional wall motion  abnormalities. Left ventricular diastolic parameters are consistent with Grade I diastolic dysfunction (impaired relaxation). 2. Right ventricular systolic function is normal. The right ventricular size is normal. 3. Left atrial size was mildly dilated. 4. Right atrial size was mild to moderately dilated. 5. The mitral valve is abnormal. Mild mitral valve regurgitation. 6. The aortic valve is abnormal. Aortic valve regurgitation is mild. Mild aortic valve sclerosis is present, with no evidence of aortic valve stenosis.  Patient Profile     79 y.o. female with history of HTN, HLD, severe scoliosis, possible dementia, and s/p R sided hip fx and surgical repair with subsequent afib with RVR, now maintaining NSR.   Assessment & Plan    Atrial fibrillation with RVR --No current racing heart rate or palpitations.  Does report previous palpitations and racing heart rate at the time of her atrial fibrillation.  She suffered right hip fracture and underwent surgical repair with subsequent atrial fibrillation with RVR and ventricular rates up into the 160s.  She did not respond to IV diltiazem with transition to amiodarone.  She converted to NSR 9/24 and is maintaining NSR today.   --IV heparin discontinued with patient started on Eliquis. Per EMR, daughter has suspected pt may have a history of falls; therefore, recommend ongoing consideration of risks versus benefits of OAC recommended going forward.  For now, continue [facility] with close follow-up.  --She has a CHA2DS2-VASc score of 5 (CHF, hypertension, age x2, female) with recommendation for [facility] with consideration of risks versus benefits.  Does not yet meet 2/3 criteria for reduced dosing Eliquis (only meets weight of below 60kg criteria) - continue to monitor renal function.  --Continue Eliquis 5 mg twice daily, amiodarone 200 mg twice daily.  Continue Lopressor 12.5 mg twice daily with consolidation to Toprol at discharge if possible.  Will discontinue  ASA 81 mg daily to reduce the risk of bleeding.  Recommend monitor patient closely on amiodarone with TSH, LFTs, chest x-ray, PFTs as indicated per monitoring guidelines.  Most recent LFTs stable.  Repeat CBC and BMET at RTC.  Follow-up in clinic.  Hypokalemia, hypomagnesia, hypoalbuminemia --Potassium 3.2.  Replete with goal 4.0.  Monitor magnesium with goal 2.0.  Current magnesium 1.7.  Albumin low at 2.1 and should continue to be monitored.  Replete electrolytes to avoid further arrhythmia.  Acute on chronic HFpEF --Denies current shortness of breath.  Reports only shortness of breath that occurs with her cough, which she reportedly developed during her admission.  Consider current pulmonary infection as contributing to her shortness of breath and cough.  She has been started on antibiotics.  Continue.  Recent 9/25 echo as above with EF 65 to 70% and grade 1 diastolic dysfunction.   --Appears euvolemic and well compensated on exam.  Received IV Lasix x1 yesterday.  Renal function stable when compared with yesterday.   --She remains on nasal cannula oxygen and abx.  She states she was not on oxygen at home.  Wean oxygen as tolerated and as she continues to recover from her pulmonary infection. --Continue to monitor I's/O's.  Daily weights.  Net 1.2 L yesterday with weight 54.4 kg as of 9/24, reduced from 56 kg on 9/21.  Daily BMET.  Replete electrolytes as needed with potassium goal 4.0 and magnesium goal 2.0. CHF education. Low salt diet and monitor fluids.   HTN, HTN --BP well controlled to soft.  Monitor closely and avoid hypotension/prerenal AKI.  Continue current antihypertensives and titrate as needed for BP control.  Monitor vitals closely.  Supply demand ischemia --High-sensitivity troponin peaked at 1523 on 9/24 in the setting of rapid ventricular rate and comorbid conditions.  Denies chest pain.  Echo as above with normal LVEF.  With ongoing symptoms, consider outpatient ischemic work-up.   Will defer for now.  No plans for invasive ischemic work-up this admission.  Anemia --Recent hemoglobin 9.6 with hematocrit 28.3 and relatively stable when compared with yesterday.  Daily CBC.  Per IM.  Monitor CBC closely on Eliquis as an outpatient.  R hip fracture s/p repair --Per IM, ortho.   For questions or updates, please contact [facility] Please consult [email or web address] for contact info under        Signed, Lennon AlstromJacquelyn D Ciani Rutten, PA-C  03/15/2020, 2:04 PM

## 2020-03-15 NOTE — Care Management Important Message (Signed)
Important Message  Patient Details  Name: MARIEANN ZIPP MRN: [id number] Date of Birth: 12-05-1940   Medicare Important Message Given:  Yes     Johnell Comings 03/15/2020, 12:56 PM

## 2020-03-15 NOTE — Progress Notes (Signed)
Occupational Therapy Treatment Patient Details Name: Michelle Bauer MRN: [id number] DOB: August 28, 1940 Today's Date: 03/15/2020    History of present illness Pt is a 79 y.o. female with medical history significant of 79 year old female with past medical history of hypertension, hyperlipidemia, anxiety/depression, severe scoliosis, diverticular bleed presented status post fall.  Patient was on her usual health and her daughter was coming to visit her.  She went to unlock to double lock since her daughter did not have the key to the door and she reports a chair was pulled out in the kitchen and somehow she stumbled over it and fell.  She did hit her head against a wall.  Daughter arrived 30 minutes later meanwhile patient was on the floor.  Pt initially refused to come to the ER, but could not walk.  Found to have R hip fx and is now s/p ORIF 9/22.   OT comments  Pt transferred to PCU c new orders received. Pt seen for OT treatment on this date. Upon arrival to room pt reclined in bed c daughter at bedside. Pt denies pain and is agreeable to seated ADL session. Pt requires MOD A to exit L side of bed. Pt required SETUP + SUPERVISION for face washing and tooth brushing seated EOB. MAX A for LBD at bed level. MOD A to return to bed. Pt verbalized understanding of instruction provided. Pt making good progress toward goals. Pt continues to benefit from skilled OT services to maximize return to PLOF and minimize risk of future falls, injury, caregiver burden, and readmission. Will continue to follow POC. Discharge recommendation remains appropriate.    Follow Up Recommendations  SNF    Equipment Recommendations  Other (comment) (TBD)    Recommendations for Other Services      Precautions / Restrictions Precautions Precautions: Fall Restrictions Weight Bearing Restrictions: Yes RLE Weight Bearing: Weight bearing as tolerated       Mobility Bed Mobility Overal bed mobility: Needs Assistance Bed  Mobility: Supine to Sit;Sit to Supine     Supine to sit: Mod assist Sit to supine: Mod assist   General bed mobility comments: not assessed in this session  Transfers     General transfer comment: Pt deferred stating fatigue    Balance Overall balance assessment: Needs assistance Sitting-balance support: Feet supported;Single extremity supported Sitting balance-Leahy Scale: Fair Sitting balance - Comments: pt able to keep balance for quiet sitting Postural control: Left lateral lean            ADL either performed or assessed with clinical judgement   ADL Overall ADL's : Needs assistance/impaired         General ADL Comments: SETUP + SUPERVISION face washing and tooth brushing seated EOB. MAX A for LBD at bed level                Cognition Arousal/Alertness: Awake/alert Behavior During Therapy: WFL for tasks assessed/performed Overall Cognitive Status: Impaired/Different from baseline Area of Impairment: Orientation;Attention;Following commands;Safety/judgement;Problem solving;Awareness;Memory        Orientation Level: Disoriented to;Time     Following Commands: Follows multi-step commands consistently            Exercises Exercises: Other exercises Other Exercises Other Exercises: Pt and family educated re: DME recs, d/c recs, falls prevention, ECS Other Exercises: Grooming, LBD, sup<>sit, sitting balance/tolerance      General Comments SpO2 stable on 2L [place] - removed for face washing seated EOB stable at 93%, desat to 89% after ~54min - resolved c  2L [place]    Pertinent Vitals/ Pain       Pain Assessment: No/denies pain Faces Pain Scale: Hurts even more Pain Location: no pain at rest in R LE at rest but pain with WB. pt has burning pain in arm where IV was placed. nurse notified Pain Descriptors / Indicators: Burning Pain Intervention(s): Limited activity within patient's tolerance;Monitored during session;Repositioned         Frequency  Min  2X/week        Progress Toward Goals  OT Goals(current goals can now be found in the care plan section)  Progress towards OT goals: Progressing toward goals  Acute Rehab OT Goals Patient Stated Goal: go home OT Goal Formulation: With patient Time For Goal Achievement: 03/26/20 Potential to Achieve Goals: Fair ADL Goals Pt Will Perform Grooming: with modified independence;sitting Pt Will Perform Lower Body Dressing: with mod assist;sitting/lateral leans ( c LRAD PRN) Pt Will Transfer to Toilet: with mod assist;stand pivot transfer;bedside commode ( c LRAD PRN)  Plan Discharge plan remains appropriate;Frequency remains appropriate       AM-PAC OT "6 Clicks" Daily Activity     Outcome Measure   Help from another person eating meals?: A Little Help from another person taking care of personal grooming?: A Little Help from another person toileting, which includes using toliet, bedpan, or urinal?: A Lot Help from another person bathing (including washing, rinsing, drying)?: A Lot Help from another person to put on and taking off regular upper body clothing?: A Little Help from another person to put on and taking off regular lower body clothing?: A Lot 6 Click Score: 15    End of Session Equipment Utilized During Treatment: Oxygen  OT Visit Diagnosis: Other abnormalities of gait and mobility (R26.89);History of falling (Z91.81)   Activity Tolerance Patient tolerated treatment well   Patient Left in bed;with call bell/phone within reach;with nursing/sitter in room;with family/visitor present           Time: 6962-9528 OT Time Calculation (min): 28 min  Charges: OT General Charges $OT Visit: 1 Visit OT Treatments $Self Care/Home Management : 8-22 mins $Therapeutic Activity: 8-22 mins  Kathie Dike, M.S. OTR/L  03/15/20, 4:55 PM  ascom [phone]

## 2020-03-15 NOTE — Progress Notes (Signed)
Physical Therapy Treatment Patient Details Name: Michelle Bauer MRN: [id number] DOB: 1940/07/02 Today's Date: 03/15/2020    History of Present Illness Pt is a 79 y.o. female with medical history significant of 79 year old female with past medical history of hypertension, hyperlipidemia, anxiety/depression, severe scoliosis, diverticular bleed presented status post fall.  Patient was on her usual health and her daughter was coming to visit her.  She went to unlock to double lock since her daughter did not have the key to the door and she reports a chair was pulled out in the kitchen and somehow she stumbled over it and fell.  She did hit her head against a wall.  Daughter arrived 30 minutes later meanwhile patient was on the floor.  Pt initially refused to come to the ER, but could not walk.  Found to have R hip fx and is now s/p ORIF 9/22.    PT Comments    Of note, pt transferred to the PCU with new PT orders received. During PT session no significant decline in functional status observed compared to prior PT sessions and all current goals remain appropriate. Pt notes no pain at rest but has some pain weight movement and weightbearing on RLE. Pt has significant scoliosis and in both sitting and standing, pt demonstrates kyphotic posture with a left lateral lean. Pt able to maintain sitting balance but in standing pt also has decreased weight shift to R side and at times requires therapist assist to maintain upright. Pt able to weight shift towards R side but needs cuing to do so. Pt requites ModA to come from supine to sitting EOB, MinA to perform STS and MinA for ambulation. Pt's oxygen remain in the mid to low 90's and HR remained in the 60s. Pt will benefit from PT services in a SNF setting upon discharge to safely address deficits listed in patient problem list for decreased caregiver assistance and eventual return to PLOF.    Follow Up Recommendations  SNF     Equipment Recommendations   Other (comment)    Recommendations for Other Services       Precautions / Restrictions Precautions Precautions: Fall Restrictions Weight Bearing Restrictions: Yes RLE Weight Bearing: Weight bearing as tolerated    Mobility  Bed Mobility Overal bed mobility: Needs Assistance Bed Mobility: Supine to Sit     Supine to sit: Mod assist     General bed mobility comments: pt needs Mod A for trunk and RLE management  Transfers Overall transfer level: Needs assistance Equipment used: Rolling walker (2 wheeled) Transfers: Sit to/from Stand Sit to Stand: Min assist         General transfer comment: pt places less weight on RLE to come to standing  Ambulation/Gait Ambulation/Gait assistance: Min assist Gait Distance (Feet): 3 Feet x1, 36feet x1  Assistive device: Rolling walker (2 wheeled) Gait Pattern/deviations: Decreased weight shift to right;Trunk flexed Gait velocity: decreased   General Gait Details: pt able to ambulate a few steps forwards and backwards and to recliner. pt demonstrates decreased weight shift towards R side and requires MinA for balance   Stairs             Wheelchair Mobility    Modified Rankin (Stroke Patients Only)       Balance Overall balance assessment: Needs assistance Sitting-balance support: Feet supported;Single extremity supported Sitting balance-Leahy Scale: Fair Sitting balance - Comments: pt able to maintain quiet sitting but requires at least one UE on the bed to support trunk. Kyphotic  posture with leftwards lean noted Postural control: Left lateral lean Standing balance support: Bilateral upper extremity supported Standing balance-Leahy Scale: Poor Standing balance comment: pt requires bilateral UE and min therapist support to remain standing. pt demonstrates kyphotic posture and left lateral lean with decreased weight shift towards the R                            Cognition Arousal/Alertness:  Awake/alert Behavior During Therapy: [facility] for tasks assessed/performed                                   General Comments: pt awake and alert but does not alwats respond to commands. Unsure if this is condary to [employer] or continued cognitive impairments      Exercises Total Joint Exercises Ankle Circles/Pumps: AROM;Both;10 reps Quad Sets: AROM;Both;10 reps Gluteal Sets: AROM;Both;10 reps Hip ABduction/ADduction: AROM;Both;10 reps Straight Leg Raises: AROM;Both;10 reps Marching in Standing: AROM;Right;10 reps;Other (comment) (pt demonstrates low foot clearance)    General Comments        Pertinent Vitals/Pain Pain Score: 0-No pain Faces Pain Scale: Hurts a little bit Pain Location: no pain at rest. moderate pain in R LE with movement Pain Descriptors / Indicators: Sore Pain Intervention(s): Limited activity within patient's tolerance;Monitored during session;Repositioned    Home Living                      Prior Function            PT Goals (current goals can now be found in the care plan section) Acute Rehab PT Goals Patient Stated Goal: go home PT Goal Formulation: With patient Time For Goal Achievement: 03/26/20 Potential to Achieve Goals: Fair Progress towards PT goals: Progressing toward goals    Frequency    BID      PT Plan Current plan remains appropriate    Co-evaluation              AM-PAC PT "6 Clicks" Mobility   Outcome Measure  Help needed turning from your back to your side while in a flat bed without using bedrails?: A Lot Help needed moving from lying on your back to sitting on the side of a flat bed without using bedrails?: A Lot Help needed moving to and from a bed to a chair (including a wheelchair)?: A Lot Help needed standing up from a chair using your arms (e.g., wheelchair or bedside chair)?: A Lot Help needed to walk in hospital room?: A Lot Help needed climbing 3-5 steps with a railing? : Total 6 Click  Score: 11    End of Session Equipment Utilized During Treatment: Oxygen;Gait belt Activity Tolerance: Patient tolerated treatment well Patient left: with call bell/phone within reach;with nursing/sitter in room;with family/visitor present;in chair; chair alarm set Nurse Communication: Mobility status PT Visit Diagnosis: Muscle weakness (generalized) (M62.81);Difficulty in walking, not elsewhere classified (R26.2);Pain Pain - Right/Left: Right Pain - part of body: Hip     Time: 3016-0109 PT Time Calculation (min) (ACUTE ONLY): 36 min  Charges:                        Nicolette Bang, SPT 03/15/20. 11:42 AM

## 2020-03-15 NOTE — Progress Notes (Signed)
Subjective:  POD #5 s/p intramedullary fixation for right intertrochanteric hip fracture.   Patient reports right hip pain as mild.  Patient up out of bed to a chair.  Objective:   VITALS:   Vitals:   03/14/20 2152 03/15/20 0416 03/15/20 0717 03/15/20 1143  BP:  (!) 142/66 (!) 147/55 (!) 103/56  Pulse:  63 64 60  Resp:  16 16 17   Temp:  98 F (36.7 C) 97.6 F (36.4 C) 98.3 F (36.8 C)  TempSrc:  Oral Oral   SpO2: 90% 98% 96% 99%  Weight:      Height:        PHYSICAL EXAM: Right lower extremity Neurovascular intact Sensation intact distally Intact pulses distally Dorsiflexion/Plantar flexion intact Incision: scant drainage No cellulitis present Compartment soft  LABS  Results for orders placed or performed during the hospital encounter of 03/09/20 (from the past 24 hour(s))  Comprehensive metabolic panel     Status: Abnormal   Collection Time: 03/15/20  5:13 AM  Result Value Ref Range   Sodium 135 135 - 145 mmol/L   Potassium 3.2 (L) 3.5 - 5.1 mmol/L   Chloride 96 (L) 98 - 111 mmol/L   CO2 31 22 - 32 mmol/L   Glucose, Bld 87 70 - 99 mg/dL   BUN 18 8 - 23 mg/dL   Creatinine, Ser 03/17/20 0.44 - 1.00 mg/dL   Calcium 8.1 (L) 8.9 - 10.3 mg/dL   Total Protein 5.5 (L) 6.5 - 8.1 g/dL   Albumin 2.1 (L) 3.5 - 5.0 g/dL   AST 22 15 - 41 U/L   ALT 13 0 - 44 U/L   Alkaline Phosphatase 57 38 - 126 U/L   Total Bilirubin 0.8 0.3 - 1.2 mg/dL   GFR calc non Af Amer >60 >60 mL/min   GFR calc Af Amer >60 >60 mL/min   Anion gap 8 5 - 15  CBC with Differential/Platelet     Status: Abnormal   Collection Time: 03/15/20  5:13 AM  Result Value Ref Range   WBC 7.2 4.0 - 10.5 K/uL   RBC 3.30 (L) 3.87 - 5.11 MIL/uL   Hemoglobin 9.6 (L) 12.0 - 15.0 g/dL   HCT 03/17/20 (L) 36 - 46 %   MCV 85.8 80.0 - 100.0 fL   MCH 29.1 26.0 - 34.0 pg   MCHC 33.9 30.0 - 36.0 g/dL   RDW 03.5 00.9 - 38.1 %   Platelets 244 150 - 400 K/uL   nRBC 0.0 0.0 - 0.2 %   Neutrophils Relative % 68 %   Neutro Abs  4.8 1.7 - 7.7 K/uL   Lymphocytes Relative 22 %   Lymphs Abs 1.6 0.7 - 4.0 K/uL   Monocytes Relative 8 %   Monocytes Absolute 0.6 0 - 1 K/uL   Eosinophils Relative 1 %   Eosinophils Absolute 0.1 0 - 0 K/uL   Basophils Relative 0 %   Basophils Absolute 0.0 0 - 0 K/uL   Immature Granulocytes 1 %   Abs Immature Granulocytes 0.07 0.00 - 0.07 K/uL  Phosphorus     Status: None   Collection Time: 03/15/20  5:13 AM  Result Value Ref Range   Phosphorus 4.1 2.5 - 4.6 mg/dL  Magnesium     Status: None   Collection Time: 03/15/20  5:13 AM  Result Value Ref Range   Magnesium 1.7 1.7 - 2.4 mg/dL    DG Chest 1 View  Result Date: 03/15/2020 CLINICAL DATA:  Shortness of  breath EXAM: CHEST  1 VIEW COMPARISON:  03/14/2020 FINDINGS: Postoperative changes in the mediastinum. Diffuse cardiac enlargement. No vascular congestion. Bilateral perihilar infiltration or atelectasis. Consolidation in the left base with small left pleural effusion. Prominent thoracic scoliosis convex towards the right. IMPRESSION: Cardiac enlargement. Bilateral perihilar infiltration or atelectasis with consolidation in the left base and small left pleural effusion. Similar appearance to previous study. Electronically Signed   By: Burman Nieves M.D.   On: 03/15/2020 05:50   DG Chest 1 View  Result Date: 03/14/2020 CLINICAL DATA:  Shortness of breath EXAM: CHEST  1 VIEW COMPARISON:  03/13/2020 and prior radiographs FINDINGS: Cardiomegaly and CABG changes are again identified. LEFT LOWER lung consolidation/atelectasis and LEFT pleural effusion are again noted. There is no evidence of pneumothorax. There has been little interval change since the prior study. A severe thoracic scoliosis is again noted. IMPRESSION: Unchanged appearance of the chest with LEFT LOWER lung consolidation/atelectasis and LEFT pleural effusion. Electronically Signed   By: Harmon Pier M.D.   On: 03/14/2020 07:39   ECHOCARDIOGRAM COMPLETE  Result Date:  03/14/2020    ECHOCARDIOGRAM REPORT   Patient Name:   Michelle Bauer Date of Exam: 03/13/2020 Medical Rec #:  [id number]      Height:       61.0 in Accession #:    [id number]     Weight:       120.0 lb Date of Birth:  Nov 14, 1940     BSA:          1.520 m Patient Age:    78 years       BP:           158/69 mmHg Patient Gender: F              HR:           79 bpm. Exam Location:  [facility] Procedure: 2D Echo Indications:     Atrial Fibrillation 427.31 / I48.91  History:         Patient has no prior history of Echocardiogram examinations.                  CAD; Risk Factors:Hypertension and Dyslipidemia.  Sonographer:     Johnathan Hausen Referring Phys:  [id number] Antonieta Iba Diagnosing Phys: Dietrich Pates MD IMPRESSIONS  1. Left ventricular ejection fraction, by estimation, is 65 to 70%. The left ventricle has normal function. The left ventricle has no regional wall motion abnormalities. Left ventricular diastolic parameters are consistent with Grade I diastolic dysfunction (impaired relaxation).  2. Right ventricular systolic function is normal. The right ventricular size is normal.  3. Left atrial size was mildly dilated.  4. Right atrial size was mild to moderately dilated.  5. The mitral valve is abnormal. Mild mitral valve regurgitation.  6. The aortic valve is abnormal. Aortic valve regurgitation is mild. Mild aortic valve sclerosis is present, with no evidence of aortic valve stenosis. FINDINGS  Left Ventricle: Left ventricular ejection fraction, by estimation, is 65 to 70%. The left ventricle has normal function. The left ventricle has no regional wall motion abnormalities. The left ventricular internal cavity size was normal in size. There is  no left ventricular hypertrophy. Left ventricular diastolic parameters are consistent with Grade I diastolic dysfunction (impaired relaxation). Right Ventricle: The right ventricular size is normal. Right vetricular wall thickness was not assessed. Right ventricular systolic  function is normal. Left Atrium: Left atrial size was mildly dilated. Right Atrium: Right atrial size  was mild to moderately dilated. Pericardium: Trivial pericardial effusion is present. Mitral Valve: The mitral valve is abnormal. There is mild thickening of the mitral valve leaflet(s). There is mild calcification of the mitral valve leaflet(s). Mild mitral valve regurgitation. Tricuspid Valve: The tricuspid valve is normal in structure. Tricuspid valve regurgitation is mild. Aortic Valve: The aortic valve is abnormal. Aortic valve regurgitation is mild. Mild aortic valve sclerosis is present, with no evidence of aortic valve stenosis. Pulmonic Valve: The pulmonic valve was normal in structure. Pulmonic valve regurgitation is trivial. Aorta: The aortic root is normal in size and structure. Venous: The inferior vena cava was not well visualized. IAS/Shunts: The interatrial septum was not assessed.  LEFT VENTRICLE PLAX 2D LVIDd:         4.06 cm  Diastology LVIDs:         2.28 cm  LV e' medial:   5.77 cm/s LV PW:         0.81 cm  LV E/e' medial: 16.4 LV IVS:        0.91 cm LVOT diam:     1.90 cm LVOT Area:     2.84 cm  RIGHT VENTRICLE RV S prime:     14.60 cm/s TAPSE (M-mode): 2.1 cm LEFT ATRIUM             Index       RIGHT ATRIUM           Index LA diam:        4.80 cm 3.16 cm/m  RA Area:     23.20 cm LA Vol (A2C):   67.4 ml 44.34 ml/m RA Volume:   70.90 ml  46.64 ml/m LA Vol (A4C):   58.2 ml 38.29 ml/m LA Biplane Vol: 62.2 ml 40.92 ml/m   AORTA Ao Root diam: 3.60 cm MITRAL VALVE                TRICUSPID VALVE MV Area (PHT): 2.76 cm     TR Peak grad:   49.6 mmHg MV Decel Time: 275 msec     TR Vmax:        352.00 cm/s MV E velocity: 94.90 cm/s MV A velocity: 103.00 cm/s  SHUNTS MV E/A ratio:  0.92         Systemic Diam: 1.90 cm Dietrich Pates MD Electronically signed by Dietrich Pates MD Signature Date/Time: 03/14/2020/3:03:13 PM    Final     Assessment/Plan: 5 Days Post-Op   Active Problems:   Generalized  anxiety disorder   HLD (hyperlipidemia)   Fracture   Essential hypertension   Hypokalemia   Acute delirium   Leukocytosis   Left lower lobe pneumonia  Patient will continue with physical therapy.  Patient currently in sinus rhythm after developing atrial fibrillation over the weekend.    Hemoglobin hematocrit are stable.  Patient will require skilled nursing facility upon discharge.  Patient is weightbearing as tolerated in the right lower extremity.  Patient is okay for discharge from an orthopedic standpoint once cleared medically.  Patient will follow up with me in 10 to 14 days in the office.    Juanell Fairly , MD 03/15/2020, 2:08 PM

## 2020-03-15 NOTE — Progress Notes (Signed)
Pt's score a 7 on the RT assessment. She does not take nebulizers or MDIs at home. The nebulizer treatments are changed to PRN.

## 2020-03-15 NOTE — Progress Notes (Signed)
PROGRESS NOTE    [name]  BZJ:[id number] DOB: Mar 20, 1941 DOA: 03/09/2020 PCP: [name], MD   Brief Narrative:  HPI per Dr. [name] on 03/09/20 Michelle Bauer is a 79 y.o. female with medical history significant of 79 year old female with past medical history of hypertension, hyperlipidemia, anxiety/depression, severe scoliosis, diverticular bleed presented status post mechanical fall which occurred yesterday.  Patient was on her usual health and her daughter was coming to visit her.  She went to unlock to double lock since her daughter did not have the key to the door and she reports a chair was pulled out in the kitchen and somehow she stumbled over it and fell.  She did hit her head against a wall.  Daughter arrived 30 minutes later meanwhile patient was on the floor.  This occurred yesterday and patient refused to come to the ER.  Daughter realized patient is unable to walk with a call patient's PCP and PCP recommended them to call EMS. She reports pain of rt hip as she points to it. Denies any other pain.  She denies headache, vision change, chest pain or shortness of breath.  Daughter thinks she is starting to have some dementia signs but has not been evaluated.  Patient reports chronic diarrhea however now is more formed and soft with Metamucil as she takes it every day.  Patient is Covid negative.  However she has not had her vaccination yet.  Daughters have been vaccinated.  ED Course: Blood pressure 137/78 afebrile, on room air 97%  Review of Systems: All systems reviewed and otherwise negative.  Potassium 3.3, creatinine 1.05, glucose 135 BUN 24 WBC 8.7 hemoglobin 9.4 hematocrit 29.6 MCV 87.6  Hip x-ray revealed acute displaced intertrochanteric fracture of the proximal right femur  Chest x-ray personally reviewed no acute cardiopulmonary disease EKG personally reviewed sinus rhythm no ischemic ST changes  **Interim History Patient is to underwent  operative procedure 03/10/20 for the patient's hip fracture postoperatively she became confused and agitated and likely had delirium in the setting of her anesthesia.  She is much more awake alert and oriented today.  Also her hospital course has been complicated by her hemoglobin dropping as well as having a vitamin B12 deficiency.  Maury Dus she also had a hypokalemia which will be replete.  PT OT evaluated and recommending SNF and and orthopedic surgery recommending weightbearing as tolerated.  03/13/20 Yesterday afternoon she went into Atrial fibrillation with RVR and was transferred to the cardiac progressive care unit.  She was given a dose of IV metoprolol 5 mg x 1 and started on a diltiazem bolus and drip which was ineffective so cardiology changed her to amiodarone.  She was also given 1 unit of PRBCs given her low hemoglobin as she is will be anticoagulated and she is now on a heparin drip with no bolus.  Chest x-ray yesterday showed "Findings consistent with congestive heart failure, with worsening vascular congestion, left basilar consolidation, and new left pleural effusion."  IV Lasix yesterday and will be given another dose of IV 20 mg today.  Echocardiogram is still pending and BNP was elevated at 477.4.  Her troponins were also elevated yesterday and cardiology is evaluating and awaiting echocardiogram.  She converted to normal sinus rhythm overnight and is stable today.  Troponins elevated at 1523 and now trending down and could be demand ischemia in the setting of rapid A. fib however echocardiogram is pending and she denies any chest pain.  Cardiology has  changed the patient from a heparin drip to Eliquis and have also added a low-dose beta-blocker.  03/14/20 We will give her another dose of IV Lasix today but increase the dose to 40 mg.  We will also transition her to p.o. Eliquis.  She still remains somewhat hypoxic and remains on supplemental oxygen via nasal cannula and her oxygen  requirement went up to 4 L today.  Will fluid restrict to 1500 mL as well.  02/24/2020 She is still being diuresed and chest x-ray still shows some volume overload but cannot exclude pneumonia so we will start her on doxycycline and IV ceftriaxone.  We have given her another dose of IV Lasix today and will continue to replete her potassium given that is low in the setting of diuresis.  Will reconsult PT OT  Assessment & Plan:   Active Problems:   Generalized anxiety disorder   HLD (hyperlipidemia)   Fracture   Essential hypertension   Hypokalemia   Acute delirium   Leukocytosis  Acute Right Proximal Femur Fracture in the setting of Mechanical Fall status post intramedullary fixation of the right intertrochanteric hip fracture postoperative day 5 -Admitted to inpatient MedSurg  -Hip X-Ray showed "Acute displaced intratrochanteric fracture of the proximal right femur." -ED spoke to Dr. [name] of Orthopedics and plan is for Surgical intervention today (scheduled for 1 pm) -Continue with pain management and was given half a milligram of Dilaudid once in the ED.  Continue with hydrocodone-acetaminophen 1-2 tabs p.o. every 6 hours as needed moderate pain, IV morphine 0.5 mg every 2 as needed severe pain -Continue with supportive care and antiemetics with IV Zofran 4 mg every 6 hours as needed nausea or vomiting -She was n.p.o. after midnight for the procedure and is now on a regular diet -Bowel regimen initiated and she is on psyllium 1 packet p.o. daily, senna docusate 1 tab p.o. nightly as needed for mild constipation, as well as bisacodyl 5 mg p.o. daily as needed for moderate constipation adjust as necessary -IV fluid hydration has now been stopped given that she has been volume overloaded and will need acute heart failure, unknown type currently but will be obtaining echocardiogram.  She has received 2 doses of IV Lasix now and will receive a dose of IV 40 mg today  -Head CT obtained since  she did fall and showed age indeterminate small vessel ischemic changes in the white matter which was favored to be chronic and no acute intra cranial trauma noted. -Deferring pharmacological VTE prophylaxis to orthopedic surgery and she will be started on Lovenox today; currently on SCDs and will -PT OT evaluating but formal note is not put in yet so do not know other recommendations -Dr. [name] recommending it when she is less confused and recommends weightbearing as tolerated in the right lower extremity -Discharge planning either to SNF as PT/OT recommending this; PT OT reconsulted for reevaluation -Dr. [name] evaluated and there is no evidence of increased surgical site bleeding at this time.  Acute Post-Operative Delirium, improved -Postoperatively and likely secondary to anesthesia -Haloperidol 2.5 mg IV every 6 as needed agitation -Reorient frequently and will place on delirium precautions -If not improving will consider head MRI given that she just recently had a head CT scan and will do further work-up with a TSH, RPR, vitamin B12 level -Delirium is now improved   Essential Hypertension -On hydrochlorothiazide PTA only which will be held -Blood pressure currently stable and since she is going to surgery will hold  off and place her on IV -hydralazine as needed for systolic blood pressure more than 160 -Hold off on HCTZ  Also because of hypokalemia -Initially she was placed on a Cardizem drip when she went into A. fib with RVR and given a 10 mg bolus however this was ineffective and she became slightly hypotensive so this was stopped -Last BP reading was 103/56  Hyperlipidemia -LFTs normal on admission with an AST of 28 and ALT of 12; now AST is 21, ALT is 12 -Continue home Atorvastatin 40 mg po qHS and Omega-3 Acid Ethyl Esters 1 gram po Daily with Lunch  Leukocytosis, improved -Mild likely reactive and is improved  -Patient WBC trended up to 11.5 and is now 6.9 yesterday  and 7.2 today; if continues to worsen we will panculture -Likely in the setting of her surgery -Continue to monitor for signs and symptoms of infection -Repeat CBC in a.m.  Hypokalemia  -K+ on Admission was 3.3 and yesterday it was 3.2 this a.m. in the setting of her diuresis -GivenAnother dose of IV Lasix -Replete with potassium chloride 40 mg twice daily x2 doses -Continue to Monitor and Replete as Necessary -Repeat BMP in the AM   Depression/Anxiety -C/w Escitalopram 5 mg po BID -Pharmacy has verified the patient takes clonazepam 0.5 mg 4 times daily and this will be resumed  Acute Blood Loss Anemia on Chronic Normocytic Anemia -Patient's Hgb/Hct went from 9.4/29.6 -> 10.1/30.1 and dropped to 8.4/26.3 after surgery and yesterday was 7.2/22.7; she was typed and screened and given 1 unit of pRBC's and now Hgb/Hct is stable at 9.9/29.6 yesterday and today is 9.6/20.3 -? If Further Drop could have been dilutional given that she was receiving IVF with NS at 75 mL/hr but IVF now stopped  -Anemia panel was checked and showed an iron level of 32, U IBC 150, TIBC 182, saturation ratios of 18%, ferritin level 194, folate level of 8.2 vitamin B12 level was 128 -Continue to Monitor for S/Sx of Bleeding closely now that she is on Heparin gtt; Currently no overt bleeding noted -She was also started on Enoxaparin 40 mg sq q24h -Orthopedic Surgery started the patient on ferrous sulfate 325 mg p.o. 3 times daily  -As expected she did have a drop some post-operatively  -Repeat CBC in the AM  Vitamin B12 Deficiency -Patient's vitamin B12 level was checked and was 128 and low -Given cyanocobalamin 1000 mcg intramuscular injection once and started p.o. supplementation this AM  Hyperglycemia  -Paitent's Blood Sugar on Admission was 135 and repeat was 87 this a.m. -Check HbA1c was 5.3 -Continue to Monitor Blood Sugars carefully and if necessary will place on Sensitive Novolog SSI  New Onset A Fib  with RVR s/p Conversion to NSR -Transferred to PCU -Started Cardizem Drip and Bolus but was ineffective so Cardiology changed her to an Amiodarone gtt and bolus. Converted to NSR so Cardiology has changed the patient to p.o. Amiodarone 200 mg twice daily -Start Heparin gtt with no bolus given Hgb of 7.2 and now is improved to 9.1; heparin drip is being continued but this is likely going to be changed to Eliquis per cardiology and this will be done today  -EKG confirmed with Rates in the 160's but now in NSR -Checked CXR, Troponins, and ECHOCardiogram -ECHO showed "Left Ventricle: Left ventricular ejection fraction, by estimation, is 65 to 70%. The left ventricle has normal function. The left ventricle has no regional wall motion abnormalities. The left ventricular internal cavity size was  normal in size. There is  no left ventricular hypertrophy. Left ventricular diastolic parameters  are consistent with Grade I diastolic dysfunction (impaired relaxation)." -TSH was 1.369 -Cardiology Consulted for further evaluation and Recommendations and recommending Diuresis and changing to po Apixaban -Continue with Eliquis and amiodarone per cardiology recommendations  AKI -Mild and Improved -Presented with a BUN/Cr of 24/1.05 and and after IVF Hydration and now Diuresis has improved and BUN/Cr is now 18/0.77 -Getting diuresis and will avoid further nephrotoxic medications, contrast dyes, hypotension and renally dose medications -Continue to monitor and trend and repeat renal function in the AM  Acquired Thrombophilia -In the Setting of Atrial Fibrillation -CHA2DS2-VASc at least a 4-6 for Age, Sex, and HTN but now likely even more elevated given her history of heart failure -Anticoagulating with Heparin as above this is being changed to p.o. Eliquis by cardiology  Acute Respiratory Failure with Hypoxia -In the setting of acute Diastolic CHF -SpO2: 99 % O2 Flow Rate (L/min): 2 L/min  -Given another  dose of IV Lasix 40 mg x1 this AM; received a dose of IV Lasix yesterday at 40 mg and the prior 2 days from that she was given 20 IV daily -Chest x-ray from today as below -You cannot exclude a pneumonia despite that she is afebrile has no leukocytosis we will start her on empiric antibiotics with doxycycline IV ceftriaxone -Flutter Valve and Incentive Spirometry -Continue supplemental oxygen via nasal cannula and wean O2 as tolerated -Continuous pulse oximetry and maintain O2 saturations greater than 92% -We will need an ambulatory home O2 screen prior to discharge -I started the patient on breathing treatments with Atrovent and Xopenex every 8 and this was changed to as needed -Continue monitor respiratory status and wean O2 as tolerated  Questionable LLL Pneumonia with Small Pleural Effusion -Chest x-ray today showed consolidation in the Left Base and a small left pleural effusion -Empirically started antibiotics with IV doxycycline and IV ceftriaxone given that she continues to have an oxygen requirement despite being diuresed -Breathing treatments provided as above -Start guaifenesin 1200 g p.o. twice daily next-flutter valve and incentive spirometry -Continue with diuresis  Acute Diastolic CHF -Unknown type at this time his echo still pending -In the setting of hypotension, anemia, and volume repletion -BNP was elevated at 477.4 -Chest x-ray today showed "Cardiac enlargement. Bilateral perihilar infiltration or atelectasis with consolidation in the left base and small left pleural effusion. Similar appearance to previous study."  -Strict I's and O's, daily weights  -patient was given a dose of IV Lasix 20 mg 9/24, 9/25, and Received another dose of IV Lasix 40 mg 9/26 and will give another Dose of IV Lasix 40 mg today  -Will Fluid Restrict to 1500 mL  -Patient is +479.7 mL since admission and Weight is down 3 lbs but not repeated -Continue to monitor for signs and symptoms of volume  overload  Elevated Troponin -Possibly demand ischemia but activity troponin trended up to 1523 and is now trending down with the last check being 827 -Denies any chest pain -Echocardiogram is ordered and pending to be done -Initially cardiology added aspirin 81 mg p.o. daily and also added low-dose beta-blocker with Toprol tartrate 12.5 mg p.o. twice daily -Currently on anticoagulation with a heparin drip for likely will be changed to Eliquis per cardiology  DVT prophylaxis: SCDs; Heparin gtt being transitioned to Eliquis  Code Status: DO NOT RESUSCITATE  Family Communication: Spoke to daughter at bedside Disposition Plan: Pending Ortho clearance as well as  evaluation by PT and OT; Now needs Cardiac Clearance as well prior to safe discharge disposition and diuresis back to dry weight and weaned off of oxygen  Status is: Inpatient  Remains inpatient appropriate because:Unsafe d/c plan, IV treatments appropriate due to intensity of illness or inability to take PO and Inpatient level of care appropriate due to severity of illness   Dispo: The patient is from: Home              Anticipated d/c is to: SNF vs Home with Home Health PT/OT              Anticipated d/c date is: 1-2 days              Patient currently is not medically stable to d/c.  Consultants:   Orthopedic Surgery Dr. Juanell Fairly   Cardiology Dr. Newman Nickels  Procedures: INTRAMEDULLARY FIXATION OF RIGHT INTERTROCHANTERIC HIP FRACTURE POD 4 by Dr. Rico Ala  Antimicrobials:  Anti-infectives (From admission, onward)   Start     Dose/Rate Route Frequency Ordered Stop   03/15/20 1100  doxycycline (VIBRAMYCIN) 100 mg in sodium chloride 0.9 % 250 mL IVPB        100 mg 125 mL/hr over 120 Minutes Intravenous Every 12 hours 03/15/20 1030     03/15/20 1000  cefTRIAXone (ROCEPHIN) 1 g in sodium chloride 0.9 % 100 mL IVPB        1 g 200 mL/hr over 30 Minutes Intravenous Every 24 hours 03/15/20 0852     03/15/20 0900   azithromycin (ZITHROMAX) 500 mg in sodium chloride 0.9 % 250 mL IVPB  Status:  Discontinued        500 mg 250 mL/hr over 60 Minutes Intravenous Every 24 hours 03/15/20 0852 03/15/20 1030   03/10/20 2000  ceFAZolin (ANCEF) IVPB 2g/100 mL premix        2 g 200 mL/hr over 30 Minutes Intravenous Every 6 hours 03/10/20 1657 03/11/20 0641   03/10/20 1431  gentamicin 80 mg in 0.9% sodium chloride 250 mL irrigation  Status:  Discontinued          As needed 03/10/20 1431 03/10/20 1502   03/10/20 1300  ceFAZolin (ANCEF) IVPB 2g/100 mL premix        2 g 200 mL/hr over 30 Minutes Intravenous 30 min pre-op 03/10/20 1220 03/10/20 1310   03/10/20 1250  ceFAZolin (ANCEF) 2-4 GM/100ML-% IVPB       Note to Pharmacy: Agnes Lawrence  : cabinet override      03/10/20 1250 03/10/20 1332        Subjective: Seen and examined at bedside and she was eating breakfast.  Oxygen requirement is slowly trending down.  X-ray shows possible pneumonia so will add empiric IV doxycycline and ceftriaxone.  She denies any chest pain, lightheadedness or dizziness.  Mildly dyspneic.  No other concerns or plans at this time and states that she has some intermittent abdominal tenderness.  No other concerns or plans at this time.  Objective: Vitals:   03/14/20 2152 03/15/20 0416 03/15/20 0717 03/15/20 1143  BP:  (!) 142/66 (!) 147/55 (!) 103/56  Pulse:  63 64 60  Resp:  16 16 17   Temp:  98 F (36.7 C) 97.6 F (36.4 C) 98.3 F (36.8 C)  TempSrc:  Oral Oral   SpO2: 90% 98% 96% 99%  Weight:      Height:        Intake/Output Summary (Last 24 hours) at 03/15/2020 1246 Last  data filed at 03/14/2020 2145 Gross per 24 hour  Intake --  Output 1400 ml  Net -1400 ml   Filed Weights   03/09/20 1428 03/12/20 1823  Weight: 56 kg 54.4 kg   Examination: Physical Exam:  Constitutional: Patient is a thin elderly Caucasian female currently no acute distress and still mildly dyspneic and remains on oxygen.  Appears calm and  comfortable she has been eating her breakfast. Eyes: Lids and conjunctivae normal, sclerae anicteric  ENMT: External Ears, Nose appear normal. Grossly normal hearing. Mucous membranes are moist.  Neck: Appears normal, supple, no cervical masses, normal ROM, no appreciable thyromegaly: No appreciable JVD noted Respiratory: Diminished to auscultation bilaterally with coarse breath sounds and some crackles slightly worse on the left compared to the right and does have some mild rhonchi noted.  No appreciable rales.  Wearing supplemental oxygen via nasal cannula and has unlabored breathing currently and has been weaned from 4 L to 2 L Cardiovascular: RRR, no murmurs / rubs / gallops. S1 and S2 auscultated.  Minimal extremity edema Abdomen: Soft, mildly tender, non-distended. Bowel sounds positive.  GU: Deferred. Musculoskeletal: No clubbing / cyanosis of digits/nails. No joint deformity upper and lower extremities.  Skin: No rashes, lesions, ulcers on limited skin evaluation. No induration; Warm and dry.  Neurologic: CN 2-12 grossly intact with no focal deficits. Romberg sign and cerebellar reflexes not assessed.  Psychiatric: Normal judgment and insight. Alert and oriented x 3. Normal mood and appropriate affect.   Data Reviewed: I have personally reviewed following labs and imaging studies  CBC: Recent Labs  Lab 03/11/20 0816 03/12/20 0607 03/13/20 0257 03/14/20 0623 03/15/20 0513  WBC 11.5* 7.4 8.3 6.8 7.2  NEUTROABS 9.5* 5.7 5.8 4.9 4.8  HGB 8.4* 7.2* 9.1* 9.9* 9.6*  HCT 26.3* 22.7* 27.5* 29.6* 28.3*  MCV 89.2 87.0 86.5 86.3 85.8  PLT 204 198 232 216 244   Basic Metabolic Panel: Recent Labs  Lab 03/11/20 0816 03/12/20 0607 03/13/20 0257 03/14/20 0623 03/15/20 0513  NA 136 136 135 135 135  K 3.6 3.2* 4.3 3.6 3.2*  CL 99 102 102 99 96*  CO2 28 26 27 28 31   GLUCOSE 111* 82 110* 88 87  BUN 24* 20 23 19 18   CREATININE 0.93 0.62 0.70 0.63 0.77  CALCIUM 8.3* 7.9* 7.9* 8.1* 8.1*    MG 1.8 2.1 1.8 1.8 1.7  PHOS 3.0 2.9 3.0 3.0 4.1   GFR: Estimated Creatinine Clearance: 44.4 mL/min (by C-G formula based on SCr of 0.77 mg/dL). Liver Function Tests: Recent Labs  Lab 03/11/20 0816 03/12/20 0607 03/13/20 0257 03/14/20 0623 03/15/20 0513  AST 21 19 34 21 22  ALT 12 8 15 12 13   ALKPHOS 53 47 57 62 57  BILITOT 0.7 0.7 0.8 0.9 0.8  PROT 6.3* 5.5* 5.6* 5.8* 5.5*  ALBUMIN 2.6* 2.2* 2.2* 2.2* 2.1*   No results for input(s): LIPASE, AMYLASE in the last 168 hours. No results for input(s): AMMONIA in the last 168 hours. Coagulation Profile: Recent Labs  Lab 03/09/20 1823 03/12/20 1804  INR 1.1 1.2   Cardiac Enzymes: No results for input(s): CKTOTAL, CKMB, CKMBINDEX, TROPONINI in the last 168 hours. BNP (last 3 results) No results for input(s): PROBNP in the last 8760 hours. HbA1C: No results for input(s): HGBA1C in the last 72 hours. CBG: No results for input(s): GLUCAP in the last 168 hours. Lipid Profile: Recent Labs    03/13/20 2159  CHOL 98  HDL 33*  LDLCALC  48  TRIG 84  CHOLHDL 3.0   Thyroid Function Tests: Recent Labs    03/14/20 0623  TSH 1.369   Anemia Panel: No results for input(s): VITAMINB12, FOLATE, FERRITIN, TIBC, IRON, RETICCTPCT in the last 72 hours. Sepsis Labs: No results for input(s): PROCALCITON, LATICACIDVEN in the last 168 hours.  Recent Results (from the past 240 hour(s))  SARS Coronavirus 2 by RT PCR (hospital order, performed in [facility] hospital lab) Nasopharyngeal Nasopharyngeal Swab     Status: None   Collection Time: 03/09/20  2:31 PM   Specimen: Nasopharyngeal Swab  Result Value Ref Range Status   SARS Coronavirus 2 NEGATIVE NEGATIVE Final    Comment: (NOTE) SARS-CoV-2 target nucleic acids are NOT DETECTED.  The SARS-CoV-2 RNA is generally detectable in upper and lower respiratory specimens during the acute phase of infection. The lowest concentration of SARS-CoV-2 viral copies this assay can detect is  250 copies / mL. A negative result does not preclude SARS-CoV-2 infection and should not be used as the sole basis for treatment or other patient management decisions.  A negative result may occur with improper specimen collection / handling, submission of specimen other than nasopharyngeal swab, presence of viral mutation(s) within the areas targeted by this assay, and inadequate number of viral copies (<250 copies / mL). A negative result must be combined with clinical observations, patient history, and epidemiological information.  Fact Sheet for Patients:   BoilerBrush.com.cy  Fact Sheet for Healthcare Providers: https://pope.com/  This test is not yet approved or  cleared by the [place] FDA and has been authorized for detection and/or diagnosis of SARS-CoV-2 by FDA under an Emergency Use Authorization (EUA).  This EUA will remain in effect (meaning this test can be used) for the duration of the COVID-19 declaration under Section 564(b)(1) of the Act, 21 U.S.C. section 360bbb-3(b)(1), unless the authorization is terminated or revoked sooner.  Performed at [facility], [address], [place], [place] [address]   Surgical PCR screen     Status: Abnormal   Collection Time: 03/09/20  9:25 PM   Specimen: Nasal Mucosa; Nasal Swab  Result Value Ref Range Status   MRSA, PCR NEGATIVE NEGATIVE Final   Staphylococcus aureus POSITIVE (A) NEGATIVE Final    Comment: (NOTE) The Xpert SA Assay (FDA approved for NASAL specimens in patients 40 years of age and older), is one component of a comprehensive surveillance program. It is not intended to diagnose infection nor to guide or monitor treatment. Performed at [facility], [address] [place], [place] [address]     RN Pressure Injury Documentation:     Estimated body mass index is 22.67 kg/m as calculated from the following:   Height as of this  encounter:  (1.549 m).   Weight as of this encounter: 54.4 kg.  Malnutrition Type:      Malnutrition Characteristics:      Nutrition Interventions:    Radiology Studies: DG Chest 1 View  Result Date: 03/15/2020 CLINICAL DATA:  Shortness of breath EXAM: CHEST  1 VIEW COMPARISON:  03/14/2020 FINDINGS: Postoperative changes in the mediastinum. Diffuse cardiac enlargement. No vascular congestion. Bilateral perihilar infiltration or atelectasis. Consolidation in the left base with small left pleural effusion. Prominent thoracic scoliosis convex towards the right. IMPRESSION: Cardiac enlargement. Bilateral perihilar infiltration or atelectasis with consolidation in the left base and small left pleural effusion. Similar appearance to previous study. Electronically Signed   By: Burman Nieves M.D.   On: 03/15/2020  05:50   DG Chest 1 View  Result Date: 03/14/2020 CLINICAL DATA:  Shortness of breath EXAM: CHEST  1 VIEW COMPARISON:  03/13/2020 and prior radiographs FINDINGS: Cardiomegaly and CABG changes are again identified. LEFT LOWER lung consolidation/atelectasis and LEFT pleural effusion are again noted. There is no evidence of pneumothorax. There has been little interval change since the prior study. A severe thoracic scoliosis is again noted. IMPRESSION: Unchanged appearance of the chest with LEFT LOWER lung consolidation/atelectasis and LEFT pleural effusion. Electronically Signed   By: Harmon Pier M.D.   On: 03/14/2020 07:39   ECHOCARDIOGRAM COMPLETE  Result Date: 03/14/2020    ECHOCARDIOGRAM REPORT   Patient Name:   RICK WARNICK Date of Exam: 03/13/2020 Medical Rec #:  [id number]      Height:       61.0 in Accession #:    [id number]     Weight:       120.0 lb Date of Birth:  12-17-1940     BSA:          1.520 m Patient Age:    78 years       BP:           158/69 mmHg Patient Gender: F              HR:           79 bpm. Exam Location:  [facility] Procedure: 2D Echo Indications:     Atrial  Fibrillation 427.31 / I48.91  History:         Patient has no prior history of Echocardiogram examinations.                  CAD; Risk Factors:Hypertension and Dyslipidemia.  Sonographer:     Johnathan Hausen Referring Phys:  [id number] Antonieta Iba Diagnosing Phys: Dietrich Pates MD IMPRESSIONS  1. Left ventricular ejection fraction, by estimation, is 65 to 70%. The left ventricle has normal function. The left ventricle has no regional wall motion abnormalities. Left ventricular diastolic parameters are consistent with Grade I diastolic dysfunction (impaired relaxation).  2. Right ventricular systolic function is normal. The right ventricular size is normal.  3. Left atrial size was mildly dilated.  4. Right atrial size was mild to moderately dilated.  5. The mitral valve is abnormal. Mild mitral valve regurgitation.  6. The aortic valve is abnormal. Aortic valve regurgitation is mild. Mild aortic valve sclerosis is present, with no evidence of aortic valve stenosis. FINDINGS  Left Ventricle: Left ventricular ejection fraction, by estimation, is 65 to 70%. The left ventricle has normal function. The left ventricle has no regional wall motion abnormalities. The left ventricular internal cavity size was normal in size. There is  no left ventricular hypertrophy. Left ventricular diastolic parameters are consistent with Grade I diastolic dysfunction (impaired relaxation). Right Ventricle: The right ventricular size is normal. Right vetricular wall thickness was not assessed. Right ventricular systolic function is normal. Left Atrium: Left atrial size was mildly dilated. Right Atrium: Right atrial size was mild to moderately dilated. Pericardium: Trivial pericardial effusion is present. Mitral Valve: The mitral valve is abnormal. There is mild thickening of the mitral valve leaflet(s). There is mild calcification of the mitral valve leaflet(s). Mild mitral valve regurgitation. Tricuspid Valve: The tricuspid valve is normal in  structure. Tricuspid valve regurgitation is mild. Aortic Valve: The aortic valve is abnormal. Aortic valve regurgitation is mild. Mild aortic valve sclerosis is present, with no evidence of aortic valve stenosis. Pulmonic  Valve: The pulmonic valve was normal in structure. Pulmonic valve regurgitation is trivial. Aorta: The aortic root is normal in size and structure. Venous: The inferior vena cava was not well visualized. IAS/Shunts: The interatrial septum was not assessed.  LEFT VENTRICLE PLAX 2D LVIDd:         4.06 cm  Diastology LVIDs:         2.28 cm  LV e' medial:   5.77 cm/s LV PW:         0.81 cm  LV E/e' medial: 16.4 LV IVS:        0.91 cm LVOT diam:     1.90 cm LVOT Area:     2.84 cm  RIGHT VENTRICLE RV S prime:     14.60 cm/s TAPSE (M-mode): 2.1 cm LEFT ATRIUM             Index       RIGHT ATRIUM           Index LA diam:        4.80 cm 3.16 cm/m  RA Area:     23.20 cm LA Vol (A2C):   67.4 ml 44.34 ml/m RA Volume:   70.90 ml  46.64 ml/m LA Vol (A4C):   58.2 ml 38.29 ml/m LA Biplane Vol: 62.2 ml 40.92 ml/m   AORTA Ao Root diam: 3.60 cm MITRAL VALVE                TRICUSPID VALVE MV Area (PHT): 2.76 cm     TR Peak grad:   49.6 mmHg MV Decel Time: 275 msec     TR Vmax:        352.00 cm/s MV E velocity: 94.90 cm/s MV A velocity: 103.00 cm/s  SHUNTS MV E/A ratio:  0.92         Systemic Diam: 1.90 cm Dietrich PatesPaula Ross MD Electronically signed by Dietrich PatesPaula Ross MD Signature Date/Time: 03/14/2020/3:03:13 PM    Final    Scheduled Meds: . amiodarone  200 mg Oral BID  . apixaban  5 mg Oral BID  . aspirin  81 mg Oral Daily  . atorvastatin  40 mg Oral Q supper  . clonazePAM  0.5 mg Oral QID  . docusate sodium  100 mg Oral BID  . escitalopram  5 mg Oral Daily  . ferrous sulfate  325 mg Oral TID PC  . guaiFENesin  1,200 mg Oral BID  . influenza vaccine adjuvanted  0.5 mL Intramuscular Tomorrow-1000  . metoprolol tartrate  12.5 mg Oral BID  . multivitamin with minerals  1 tablet Oral Daily  . omega-3 acid ethyl  esters  1 g Oral Q lunch  . potassium chloride  40 mEq Oral BID  . psyllium  1 packet Oral Daily  . senna  1 tablet Oral BID  . sodium chloride flush  3 mL Intravenous Q12H  . traMADol  100 mg Oral TID  . vitamin B-12  1,000 mcg Oral Daily   Continuous Infusions: . cefTRIAXone (ROCEPHIN)  IV 1 g (03/15/20 1125)  . doxycycline (VIBRAMYCIN) IV 100 mg (03/15/20 1239)  . methocarbamol (ROBAXIN) IV      LOS: 6 days   Merlene Laughtermair Latif Cage Gupton, DO [facility] PAGER is on AMION  If 7PM-7AM, please contact night-coverage [email or web address]

## 2020-03-15 NOTE — TOC Progression Note (Signed)
Transition of Care [facility]) - Progression Note    Patient Details  Name: Michelle Bauer MRN: [id number] Date of Birth: [birth year]  Transition of Care [facility]) CM/SW Contact  [name], RN Phone Number: 03/15/2020, 1:58 PM  Clinical Narrative:     Called to speak with patient , she said she would need to discuss dc SNF with daughters, but that she didn't feel up to talking to me right now.    Called daughter Michelle Bauer and left message for her to return my call.   Expected Discharge Plan: Skilled Nursing Facility Barriers to Discharge: No Barriers Identified  Expected Discharge Plan and Services Expected Discharge Plan: Skilled Nursing Facility   Discharge Planning Services: CM Consult Post Acute Care Choice: Skilled Nursing Facility Living arrangements for the past 2 months: Single Family Home                                       Social Determinants of Health (SDOH) Interventions    Readmission Risk Interventions No flowsheet data found.

## 2020-03-16 ENCOUNTER — Encounter: Payer: Self-pay | Admitting: Internal Medicine

## 2020-03-16 ENCOUNTER — Ambulatory Visit: Payer: Medicare Other | Attending: Internal Medicine

## 2020-03-16 ENCOUNTER — Inpatient Hospital Stay: Payer: Medicare Other

## 2020-03-16 DIAGNOSIS — Z23 Encounter for immunization: Secondary | ICD-10-CM

## 2020-03-16 LAB — MAGNESIUM: Magnesium: 2 mg/dL (ref 1.7–2.4)

## 2020-03-16 LAB — CBC WITH DIFFERENTIAL/PLATELET
Abs Immature Granulocytes: 0.06 10*3/uL (ref 0.00–0.07)
Basophils Absolute: 0 10*3/uL (ref 0.0–0.1)
Basophils Relative: 1 %
Eosinophils Absolute: 0.2 10*3/uL (ref 0.0–0.5)
Eosinophils Relative: 4 %
HCT: 28.6 % — ABNORMAL LOW (ref 36.0–46.0)
Hemoglobin: 9.1 g/dL — ABNORMAL LOW (ref 12.0–15.0)
Immature Granulocytes: 1 %
Lymphocytes Relative: 32 %
Lymphs Abs: 1.7 10*3/uL (ref 0.7–4.0)
MCH: 28.3 pg (ref 26.0–34.0)
MCHC: 31.8 g/dL (ref 30.0–36.0)
MCV: 89.1 fL (ref 80.0–100.0)
Monocytes Absolute: 0.6 10*3/uL (ref 0.1–1.0)
Monocytes Relative: 12 %
Neutro Abs: 2.8 10*3/uL (ref 1.7–7.7)
Neutrophils Relative %: 50 %
Platelets: 260 10*3/uL (ref 150–400)
RBC: 3.21 MIL/uL — ABNORMAL LOW (ref 3.87–5.11)
RDW: 14.7 % (ref 11.5–15.5)
WBC: 5.5 10*3/uL (ref 4.0–10.5)
nRBC: 0 % (ref 0.0–0.2)

## 2020-03-16 LAB — COMPREHENSIVE METABOLIC PANEL
ALT: 13 U/L (ref 0–44)
AST: 20 U/L (ref 15–41)
Albumin: 2.1 g/dL — ABNORMAL LOW (ref 3.5–5.0)
Alkaline Phosphatase: 66 U/L (ref 38–126)
Anion gap: 7 (ref 5–15)
BUN: 24 mg/dL — ABNORMAL HIGH (ref 8–23)
CO2: 31 mmol/L (ref 22–32)
Calcium: 8.1 mg/dL — ABNORMAL LOW (ref 8.9–10.3)
Chloride: 96 mmol/L — ABNORMAL LOW (ref 98–111)
Creatinine, Ser: 0.84 mg/dL (ref 0.44–1.00)
GFR calc Af Amer: >60 mL/min (ref >60)
GFR calc non Af Amer: >60 mL/min (ref >60)
Glucose, Bld: 99 mg/dL (ref 70–99)
Potassium: 4.3 mmol/L (ref 3.5–5.1)
Sodium: 134 mmol/L — ABNORMAL LOW (ref 135–145)
Total Bilirubin: 0.8 mg/dL (ref 0.3–1.2)
Total Protein: 5.6 g/dL — ABNORMAL LOW (ref 6.5–8.1)

## 2020-03-16 LAB — PHOSPHORUS: Phosphorus: 3.5 mg/dL (ref 2.5–4.6)

## 2020-03-16 NOTE — Progress Notes (Signed)
Physical Therapy Treatment Patient Details Name: Michelle Bauer MRN: [id number] DOB: 1940/09/18 Today's Date: 03/16/2020    History of Present Illness Pt is a 79 y.o. female with medical history significant of 79 year old female with past medical history of hypertension, hyperlipidemia, anxiety/depression, severe scoliosis, diverticular bleed presented status post fall.  Patient was on her usual health and her daughter was coming to visit her.  She went to unlock to double lock since her daughter did not have the key to the door and she reports a chair was pulled out in the kitchen and somehow she stumbled over it and fell.  She did hit her head against a wall.  Daughter arrived 30 minutes later meanwhile patient was on the floor.  Pt initially refused to come to the ER, but could not walk.  Found to have R hip fx and is now s/p ORIF 9/22.    PT Comments    Pt seemed more alert during this session. Pt was better able to respond to questions and follow commands. Pt was able to complete all therex with mild fatigue and moderate pain in R LE. Pt still required increased time and ModA for bed mobility but showed improved sequencing. In sitting pt demonstrated kyphotic posture with L lateral lean. This c also be seen in standing. Pt was able to maintain balance sitting EOB for 10 minutes to receive medications from nursing and talk to MD.  Pt was able to perform STS with MinA but was hesitant to place weight on R LE. Weight shifts to the R side were performed within the walker but required tactile cues to complete and the pt fatigued quickly. After rest, pt was able to perform STS for MinA and then ambulate 2' to chair with MinA for balance. Pt will benefit from PT services in a SNF setting upon discharge to safely address deficits listed in patient problem list for decreased caregiver assistance and eventual return to PLOF.   Follow Up Recommendations  SNF     Equipment Recommendations  Other  (comment) (to be determined at next level of care)    Recommendations for Other Services       Precautions / Restrictions Precautions Precautions: Fall Restrictions Weight Bearing Restrictions: Yes RLE Weight Bearing: Weight bearing as tolerated    Mobility  Bed Mobility Overal bed mobility: Needs Assistance Bed Mobility: Supine to Sit     Supine to sit: Mod assist     General bed mobility comments: pt requires ModA for trunk managment.  Transfers Overall transfer level: Needs assistance Equipment used: Rolling walker (2 wheeled) Transfers: Sit to/from Stand Sit to Stand: Min assist         General transfer comment: pt requires MinA and increased time to come to standing  Ambulation/Gait Ambulation/Gait assistance: Min assist Gait Distance (Feet): 2 Feet Assistive device: Rolling walker (2 wheeled) Gait Pattern/deviations: Decreased weight shift to right;Trunk flexed;Step-to pattern;Decreased stance time - right Gait velocity: decreased   General Gait Details: pt requires MinA for balance. pt requires Max cuing to shift weight towards the R side and sequencing for walker   Stairs             Wheelchair Mobility    Modified Rankin (Stroke Patients Only)       Balance Overall balance assessment: Needs assistance Sitting-balance support: Feet supported;Single extremity supported Sitting balance-Leahy Scale: Fair Sitting balance - Comments: pt shows increased ability to maintain quiet sitting but displays L lateral lean Postural control: Left lateral  lean Standing balance support: Bilateral upper extremity supported Standing balance-Leahy Scale: Poor Standing balance comment: pt requires bilateral UE and min therapist support to remain standing. pt demonstrates kyphotic posture and left lateral lean with decreased weight shift towards the R. Pt needs Max VCs to shift towards R                            Cognition Arousal/Alertness:  Awake/alert Behavior During Therapy: WFL for tasks assessed/performed Overall Cognitive Status: Within Functional Limits for tasks assessed                                 General Comments: pt more mentally present and talkative than previous session. pt occasionally takes a while to respond but shows improved ability to answer questions and respond to commands      Exercises Total Joint Exercises Ankle Circles/Pumps: AROM;Both;10 reps Quad Sets: AROM;Both;10 reps Gluteal Sets: AROM;Both;10 reps Hip ABduction/ADduction: AROM;Both;10 reps Straight Leg Raises: AROM;Both;10 reps Long Arc Quad: AROM;Both;10 reps Other Exercises Other Exercises: weigh shifting towards R side in standing within the walker with MinA and tactile cues x 10 Other Exercises: pt sat at EOB for 10 minutes and stood 3 minutes for tolerence to upright posistion    General Comments General comments (skin integrity, edema, etc.): Nursing decreased oxygen from 2L to 1L during session. pt's oxygen remained above 92%      Pertinent Vitals/Pain Pain Assessment: No/denies pain Faces Pain Scale: Hurts a little bit Pain Location: no pain at rest but has pain in R LE with movement and weightbearing Pain Descriptors / Indicators: Sore;Operative site guarding Pain Intervention(s): Limited activity within patient's tolerance;Monitored during session;Repositioned    Home Living                      Prior Function            PT Goals (current goals can now be found in the care plan section) Acute Rehab PT Goals Patient Stated Goal: go home PT Goal Formulation: With patient Time For Goal Achievement: 03/26/20 Potential to Achieve Goals: Fair Progress towards PT goals: Progressing toward goals    Frequency    BID      PT Plan Frequency needs to be updated (update to BID)    Co-evaluation              AM-PAC PT "6 Clicks" Mobility   Outcome Measure  Help needed turning from  your back to your side while in a flat bed without using bedrails?: A Lot Help needed moving from lying on your back to sitting on the side of a flat bed without using bedrails?: A Lot Help needed moving to and from a bed to a chair (including a wheelchair)?: A Lot Help needed standing up from a chair using your arms (e.g., wheelchair or bedside chair)?: A Little Help needed to walk in hospital room?: A Lot Help needed climbing 3-5 steps with a railing? : Total 6 Click Score: 12    End of Session Equipment Utilized During Treatment: Gait belt;Oxygen Activity Tolerance: Patient tolerated treatment well Patient left: with family/visitor present;with chair alarm set;with call bell/phone within reach;in chair Nurse Communication: Mobility status;Other (comment) (nursing notified that SCDs and purewick needed to be replaced) PT Visit Diagnosis: Muscle weakness (generalized) (M62.81);Difficulty in walking, not elsewhere classified (R26.2);Pain Pain - Right/Left: Right  Pain - part of body: Hip     Time: 5597-4163 PT Time Calculation (min) (ACUTE ONLY): 51 min  Charges:                        Nicolette Bang, SPT 03/16/20. 10:44 AM

## 2020-03-16 NOTE — Progress Notes (Signed)
Physical Therapy Treatment Patient Details Name: Michelle Bauer MRN: [id number] DOB: Jun 30, 1940 Today's Date: 03/16/2020    History of Present Illness Pt is a 79 y.o. female with medical history significant of 78 year old female with past medical history of hypertension, hyperlipidemia, anxiety/depression, severe scoliosis, diverticular bleed presented status post fall.  Patient was on her usual health and her daughter was coming to visit her.  She went to unlock to double lock since her daughter did not have the key to the door and she reports a chair was pulled out in the kitchen and somehow she stumbled over it and fell.  She did hit her head against a wall.  Daughter arrived 30 minutes later meanwhile patient was on the floor.  Pt initially refused to come to the ER, but could not walk.  Found to have R hip fx and is now s/p ORIF 9/22.    PT Comments    Pt was found in chair and had been sitting up since previous session. Pt was able to complete all chair exercise with minimal fatigue and SpO2 remaining in the 90s. Pt noted some increased pain in R hip with maximal trunk flexion but had no increased pain otherwise. Pt was able to perform STS with MinA but continues to displays decreased WB on the R leg and requires Max cueing to complete. Pt able to ambulate 2 feet but needed to sit due to fatigue and needing to have a BM. Pt performed a stand pivot transfer with Min A and required Mod VCs for sequencing to [facility]. Pt was left with nursing as pt stated "this will take a moment and I have gas". Pt will benefit from PT services in a SNF setting upon discharge to safely address deficits listed in patient problem list for decreased caregiver assistance and eventual return to PLOF.   Follow Up Recommendations  SNF     Equipment Recommendations  Other (comment) (to be determined at next level of care)    Recommendations for Other Services       Precautions / Restrictions Precautions Precautions:  Fall Restrictions Weight Bearing Restrictions: Yes RLE Weight Bearing: Weight bearing as tolerated    Mobility  Bed Mobility               General bed mobility comments: Not assessed at pt was found in chair and was left on [facility]  Transfers Overall transfer level: Needs assistance Equipment used: Standard walker Transfers: Sit to/from Stand Sit to Stand: Min guard  Stand Pivot Transfer: Min A with RW         General transfer comment: pt requires MinA and increased time to come to standing. pt is hesitant to place weight through R LE when coming to stand  Ambulation/Gait Ambulation/Gait assistance: Min assist Gait Distance (Feet): 2 Feet Assistive device: Rolling walker (2 wheeled) Gait Pattern/deviations: Decreased weight shift to right;Trunk flexed;Step-to pattern;Decreased stance time - right Gait velocity: decreased   General Gait Details: pt requires MinA for balance. pt requires Max cuing to shift weight towards the R side and sequencing for walker   Stairs             Wheelchair Mobility    Modified Rankin (Stroke Patients Only)       Balance Overall balance assessment: Needs assistance Sitting-balance support: Feet supported;Single extremity supported Sitting balance-Leahy Scale: Fair Sitting balance - Comments: pt demonstrates L lateral lean but is able to main sitting balance outside BOS Postural control: Left lateral lean Standing  balance support: Bilateral upper extremity supported Standing balance-Leahy Scale: Poor Standing balance comment: pt requires bilateral UE and min therapist support to remain standing. pt demonstrates kyphotic posture and left lateral lean with decreased weight shift towards the R. Pt needs Max VCs to shift towards R                            Cognition Arousal/Alertness: Awake/alert Behavior During Therapy: WFL for tasks assessed/performed Overall Cognitive Status: Within Functional Limits for tasks  assessed                         Following Commands: Follows multi-step commands consistently       General Comments: overall mentation seems improved      Exercises Total Joint Exercises Towel Squeeze: AROM;Both;10 reps Hip ABduction/ADduction: AROM;Both;10 reps Long Arc Quad: AROM;Both;10 reps Other Exercises Other Exercises: forward leaning outside BOS in sitting x 10. Forward lean with right sided bias x 10 Other Exercises: pt sat at EOB for 10 minutes and stood 3 minutes for tolerence to upright posistion    General Comments General comments (skin integrity, edema, etc.): pt's O2 remined in the 90s throughout the session. HR remained in the low 60s thoughout session      Pertinent Vitals/Pain Pain Assessment: 0-10 Pain Score: 0-No pain Faces Pain Scale: Hurts a little bit Pain Location: no pain at rest but has pain in R LE with movement and weightbearing Pain Descriptors / Indicators: Sore;Operative site guarding    Home Living                      Prior Function            PT Goals (current goals can now be found in the care plan section) Acute Rehab PT Goals Patient Stated Goal: go home PT Goal Formulation: With patient Time For Goal Achievement: 03/26/20 Potential to Achieve Goals: Fair Progress towards PT goals: Progressing toward goals    Frequency    BID      PT Plan Current plan remains appropriate    Co-evaluation              AM-PAC PT "6 Clicks" Mobility   Outcome Measure  Help needed turning from your back to your side while in a flat bed without using bedrails?: A Lot Help needed moving from lying on your back to sitting on the side of a flat bed without using bedrails?: A Lot Help needed moving to and from a bed to a chair (including a wheelchair)?: A Lot Help needed standing up from a chair using your arms (e.g., wheelchair or bedside chair)?: A Little Help needed to walk in hospital room?: A Lot Help needed  climbing 3-5 steps with a railing? : Total 6 Click Score: 12    End of Session Equipment Utilized During Treatment: Gait belt;Oxygen Activity Tolerance: Patient tolerated treatment well Patient left: with nursing/sitter in room (pt left on BSC while having a BM with nursing tech) Nurse Communication: Mobility status PT Visit Diagnosis: Muscle weakness (generalized) (M62.81);Difficulty in walking, not elsewhere classified (R26.2);Pain Pain - Right/Left: Right Pain - part of body: Hip     Time: 9381-8299 PT Time Calculation (min) (ACUTE ONLY): 30 min  Charges:  $Gait Training: 8-22 mins $Therapeutic Exercise: 8-22 mins $Therapeutic Activity: 8-22 mins  Nicolette Bang, SPT 03/16/20. 2:38 PM

## 2020-03-16 NOTE — Progress Notes (Signed)
Progress Note  Patient Name: Michelle Bauer Date of Encounter: 03/16/2020  Primary Cardiologist: Julien Nordmann, MD   Subjective   Ongoing cough.   No chest pain or shortness of breath.  Inpatient Medications    Scheduled Meds: . amiodarone  200 mg Oral BID  . apixaban  5 mg Oral BID  . aspirin  81 mg Oral Daily  . atorvastatin  40 mg Oral Q supper  . clonazePAM  0.5 mg Oral QID  . docusate sodium  100 mg Oral BID  . escitalopram  5 mg Oral Daily  . ferrous sulfate  325 mg Oral TID PC  . guaiFENesin  1,200 mg Oral BID  . influenza vaccine adjuvanted  0.5 mL Intramuscular Tomorrow-1000  . metoprolol tartrate  12.5 mg Oral BID  . multivitamin with minerals  1 tablet Oral Daily  . omega-3 acid ethyl esters  1 g Oral Q lunch  . psyllium  1 packet Oral Daily  . senna  1 tablet Oral BID  . sodium chloride flush  3 mL Intravenous Q12H  . traMADol  100 mg Oral TID  . vitamin B-12  1,000 mcg Oral Daily   Continuous Infusions: . cefTRIAXone (ROCEPHIN)  IV 1 g (03/16/20 0936)  . doxycycline (VIBRAMYCIN) IV 100 mg (03/15/20 2347)  . methocarbamol (ROBAXIN) IV     PRN Meds: acetaminophen, alum & mag hydroxide-simeth, bisacodyl, bisacodyl, haloperidol lactate, hydrALAZINE, HYDROcodone-acetaminophen, ipratropium, levalbuterol, magnesium citrate, menthol-cetylpyridinium **OR** phenol, methocarbamol **OR** methocarbamol (ROBAXIN) IV, morphine injection, ondansetron **OR** ondansetron (ZOFRAN) IV, polyethylene glycol, promethazine   Vital Signs    Vitals:   03/15/20 2110 03/16/20 0346 03/16/20 0732 03/16/20 0934  BP: (!) 118/57 (!) 144/58 (!) 131/58   Pulse: 63 (!) 54 (!) 56 65  Resp: 18 18 17    Temp: 97.7 F (36.5 C)  (!) 97.5 F (36.4 C)   TempSrc:   Oral   SpO2: 99% 100% 99%   Weight:      Height:        Intake/Output Summary (Last 24 hours) at 03/16/2020 0952 Last data filed at 03/16/2020 0400 Gross per 24 hour  Intake 654.27 ml  Output --  Net 654.27 ml   Last 3  Weights 03/13/2020 03/12/2020 03/09/2020  Weight (lbs) (No Data) 120 lb 123 lb 7.3 oz  Weight (kg) (No Data) 54.432 kg 56 kg  Some encounter information is confidential and restricted. Go to Review Flowsheets activity to see all data.      Telemetry    SB to NSR, rates 50s-low 80s- Personally Reviewed  ECG    No new tracings - Personally Reviewed  Physical Exam   GEN: No acute distress.    Neck: No JVD Cardiac: RRR, 1/6 systolic murmur. No rubs, or gallops.  Respiratory: Clear to auscultation bilaterally, on [place] oxygen, reduced bilateral breath sounds. GI: Soft, nontender, non-distended  MS: No edema; No deformity. Neuro:  Nonfocal  Psych: Normal affect   Labs    High Sensitivity Troponin:   Recent Labs  Lab 03/12/20 1804 03/13/20 1022 03/13/20 1214  TROPONINIHS 1,523* 1,093* 827*      Chemistry Recent Labs  Lab 03/14/20 0623 03/15/20 0513 03/16/20 0332  NA 135 135 134*  K 3.6 3.2* 4.3  CL 99 96* 96*  CO2 28 31 31   GLUCOSE 88 87 99  BUN 19 18 24*  CREATININE 0.63 0.77 0.84  CALCIUM 8.1* 8.1* 8.1*  PROT 5.8* 5.5* 5.6*  ALBUMIN 2.2* 2.1* 2.1*  AST 21 22 20   ALT 12 13 13   ALKPHOS 62 57 66  BILITOT 0.9 0.8 0.8  GFRNONAA >60 >60 >60  GFRAA >60 >60 >60  ANIONGAP 8 8 7      Hematology Recent Labs  Lab 03/14/20 0623 03/15/20 0513 03/16/20 0332  WBC 6.8 7.2 5.5  RBC 3.43* 3.30* 3.21*  HGB 9.9* 9.6* 9.1*  HCT 29.6* 28.3* 28.6*  MCV 86.3 85.8 89.1  MCH 28.9 29.1 28.3  MCHC 33.4 33.9 31.8  RDW 14.0 14.4 14.7  PLT 216 244 260    BNP Recent Labs  Lab 03/13/20 0257  BNP 477.4*     DDimer No results for input(s): DDIMER in the last 168 hours.   Radiology    DG Chest 1 View  Result Date: 03/16/2020 CLINICAL DATA:  Shortness of breath.  Fell yesterday EXAM: CHEST  1 VIEW COMPARISON:  03/15/2020 FINDINGS: Postoperative changes in the mediastinum. Cardiac enlargement. Infiltration or atelectasis in the lung bases, greater on the left, with small left  pleural effusion. Prominent thoracic scoliosis convex towards the right. No change since previous study. IMPRESSION: Cardiac enlargement. Infiltration or atelectasis in the lung bases with small left pleural effusion. Electronically Signed   By: 03/15/20 M.D.   On: 03/16/2020 03:57   DG Chest 1 View  Result Date: 03/15/2020 CLINICAL DATA:  Shortness of breath EXAM: CHEST  1 VIEW COMPARISON:  03/14/2020 FINDINGS: Postoperative changes in the mediastinum. Diffuse cardiac enlargement. No vascular congestion. Bilateral perihilar infiltration or atelectasis. Consolidation in the left base with small left pleural effusion. Prominent thoracic scoliosis convex towards the right. IMPRESSION: Cardiac enlargement. Bilateral perihilar infiltration or atelectasis with consolidation in the left base and small left pleural effusion. Similar appearance to previous study. Electronically Signed   By: 03/18/2020 M.D.   On: 03/15/2020 05:50    Cardiac Studies   Echo 03/13/20 1. Left ventricular ejection fraction, by estimation, is 65 to 70%. The left ventricle has normal function. The left ventricle has no regional wall motion abnormalities. Left ventricular diastolic parameters are consistent with Grade I diastolic dysfunction (impaired relaxation). 2. Right ventricular systolic function is normal. The right ventricular size is normal. 3. Left atrial size was mildly dilated. 4. Right atrial size was mild to moderately dilated. 5. The mitral valve is abnormal. Mild mitral valve regurgitation. 6. The aortic valve is abnormal. Aortic valve regurgitation is mild. Mild aortic valve sclerosis is present, with no evidence of aortic valve stenosis.  Patient Profile     79 y.o. female with history of HTN, HLD, severe scoliosis, possible dementia, and s/p R sided hip fx and surgical repair with subsequent afib with RVR, now maintaining NSR.   Assessment & Plan    Atrial fibrillation with RVR --No  current racing heart rate or palpitations.  --Maintaining NSR after post op Afib with RVR s/p R hip fx.   --Converted to NSR 9/24. Now SB-NSR as above.  --Started on Eliquis 5mg  BID as does not qualify for reduced dose OAC. --CHA2DS2-VASc score of 5 (CHF, hypertension, age x2, female) with recommendation for [facility] with consideration of risks versus benefits.   --Continue amiodarone 200 mg twice daily. OP monitoring on amiodarone with TSH, LFTs, chest x-ray, PFTs as indicated per monitoring guidelines.  Most recent LFTs stable.   --Continue Lopressor 12.5 mg twice daily. Recommend consolidation of Lopressor to Toprol at discharge if possible. Follow-up in the office.  Acute on chronic HFpEF --Denies current shortness of breath.   --  Received IV lasix with lasix 40mg  x1 yesterday.  --Output -1.4L with daily net -1.6L on 9/26 and wt 54.4kg on 9/24. --Reports cough ongoing, started during admission.  --Spoke with IM and unclear etiology of current pulmonary infection - plan is to continue on antibiotics.   --Recent 9/25 echo as above with EF 65 to 70% and grade 1 diastolic dysfunction. --Appears euvolemic and well compensated on exam.   --Continue to monitor I/O, daily Wts. --Follow-up in the office.  Hypoalbuminemia  --Monitor. Daily BMET.  HTN --BP improved today to somewhat hypertensive; however, she has not yet received her medications.  Continue to monitor vitals to see if BP improves s/p Lopressor this AM.   Supply demand ischemia --High-sensitivity troponin peaked at 1,523 on 9/24 in the setting of rapid ventricular rate and comorbid conditions.  Denies chest pain today.  Echo as above with normal LVEF.  With ongoing symptoms, consider outpatient ischemic work-up.  Will defer for now.  No plans for invasive ischemic work-up this admission.  Anemia --H&H stable.  Daily CBC. Monitor CBC closely on Eliquis as an outpatient.  R hip fracture s/p repair --Per IM, ortho.   For questions or  updates, please contact [facility] Please consult [email or web address] for contact info under        Signed, 10/24, PA-C  03/16/2020, 9:52 AM

## 2020-03-16 NOTE — Progress Notes (Signed)
PROGRESS NOTE    [name]  BZJ:[id number] DOB: Mar 20, 1941 DOA: 03/09/2020 PCP: [name], MD   Brief Narrative:  HPI per Dr. [name] on 03/09/20 Michelle Bauer is a 79 y.o. female with medical history significant of 79 year old female with past medical history of hypertension, hyperlipidemia, anxiety/depression, severe scoliosis, diverticular bleed presented status post mechanical fall which occurred yesterday.  Patient was on her usual health and her daughter was coming to visit her.  She went to unlock to double lock since her daughter did not have the key to the door and she reports a chair was pulled out in the kitchen and somehow she stumbled over it and fell.  She did hit her head against a wall.  Daughter arrived 30 minutes later meanwhile patient was on the floor.  This occurred yesterday and patient refused to come to the ER.  Daughter realized patient is unable to walk with a call patient's PCP and PCP recommended them to call EMS. She reports pain of rt hip as she points to it. Denies any other pain.  She denies headache, vision change, chest pain or shortness of breath.  Daughter thinks she is starting to have some dementia signs but has not been evaluated.  Patient reports chronic diarrhea however now is more formed and soft with Metamucil as she takes it every day.  Patient is Covid negative.  However she has not had her vaccination yet.  Daughters have been vaccinated.  ED Course: Blood pressure 137/78 afebrile, on room air 97%  Review of Systems: All systems reviewed and otherwise negative.  Potassium 3.3, creatinine 1.05, glucose 135 BUN 24 WBC 8.7 hemoglobin 9.4 hematocrit 29.6 MCV 87.6  Hip x-ray revealed acute displaced intertrochanteric fracture of the proximal right femur  Chest x-ray personally reviewed no acute cardiopulmonary disease EKG personally reviewed sinus rhythm no ischemic ST changes  **Interim History Patient is to underwent  operative procedure 03/10/20 for the patient's hip fracture postoperatively she became confused and agitated and likely had delirium in the setting of her anesthesia.  She is much more awake alert and oriented today.  Also her hospital course has been complicated by her hemoglobin dropping as well as having a vitamin B12 deficiency.  Maury Dus she also had a hypokalemia which will be replete.  PT OT evaluated and recommending SNF and and orthopedic surgery recommending weightbearing as tolerated.  03/13/20 Yesterday afternoon she went into Atrial fibrillation with RVR and was transferred to the cardiac progressive care unit.  She was given a dose of IV metoprolol 5 mg x 1 and started on a diltiazem bolus and drip which was ineffective so cardiology changed her to amiodarone.  She was also given 1 unit of PRBCs given her low hemoglobin as she is will be anticoagulated and she is now on a heparin drip with no bolus.  Chest x-ray yesterday showed "Findings consistent with congestive heart failure, with worsening vascular congestion, left basilar consolidation, and new left pleural effusion."  IV Lasix yesterday and will be given another dose of IV 20 mg today.  Echocardiogram is still pending and BNP was elevated at 477.4.  Her troponins were also elevated yesterday and cardiology is evaluating and awaiting echocardiogram.  She converted to normal sinus rhythm overnight and is stable today.  Troponins elevated at 1523 and now trending down and could be demand ischemia in the setting of rapid A. fib however echocardiogram is pending and she denies any chest pain.  Cardiology has  changed the patient from a heparin drip to Eliquis and have also added a low-dose beta-blocker.  03/14/20 We will give her another dose of IV Lasix today but increase the dose to 40 mg.  We will also transition her to p.o. Eliquis.  She still remains somewhat hypoxic and remains on supplemental oxygen via nasal cannula and her oxygen  requirement went up to 4 L today.  Will fluid restrict to 1500 mL as well.  03/15/2020 She is still being diuresed and chest x-ray still shows some volume overload but cannot exclude pneumonia so we will start her on doxycycline and IV ceftriaxone.  We have given her another dose of IV Lasix today and will continue to replete her potassium given that is low in the setting of diuresis.  Will reconsult PT OT  03/16/2020 She is given IV Lasix yesterday but none today.  Respiratory status appears stable and slightly improving.  Will attempt weaning off of supplemental oxygen via nasal cannula.  Patient to receive her [employer] vaccine.  She is getting closer to being discharged anticipate 1 or 2 more days.  She still needs to have a bed offer and select facility prior to discharging.  Yesterday nursing was concerned given that she is slightly confused but likely she does have some undiagnosed dementia and document followed on outpatient setting.  I discussed with the daughter and recommended outpatient neurology follow-up.  Assessment & Plan:   Active Problems:   Generalized anxiety disorder   HLD (hyperlipidemia)   Fracture   Essential hypertension   Hypokalemia   Acute delirium   Leukocytosis   Left lower lobe pneumonia   Atrial fibrillation with rapid ventricular response (HCC)  Acute Right Proximal Femur Fracture in the setting of Mechanical Fall status post intramedullary fixation of the right intertrochanteric hip fracture postoperative day 6 -Admitted to inpatient MedSurg  -Hip X-Ray showed "Acute displaced intratrochanteric fracture of the proximal right femur." -ED spoke to Dr. [name] of Orthopedics and plan is for Surgical intervention today (scheduled for 1 pm) -Continue with pain management and was given half a milligram of Dilaudid once in the ED.  Continue with hydrocodone-acetaminophen 1-2 tabs p.o. every 6 hours as needed moderate pain, IV morphine 0.5 mg every 2 as  needed severe pain -Continue with supportive care and antiemetics with IV Zofran 4 mg every 6 hours as needed nausea or vomiting -She was n.p.o. after midnight for the procedure and is now on a regular diet -Bowel regimen initiated and she is on psyllium 1 packet p.o. daily, senna docusate 1 tab p.o. nightly as needed for mild constipation, as well as bisacodyl 5 mg p.o. daily as needed for moderate constipation adjust as necessary -IV fluid hydration has now been stopped given that she has been volume overloaded and will need acute heart failure, unknown type currently but will be obtaining echocardiogram.  She has received 2 doses of IV Lasix now and will receive a dose of IV 40 mg today  -Head CT obtained since she did fall and showed age indeterminate small vessel ischemic changes in the white matter which was favored to be chronic and no acute intra cranial trauma noted. -Deferring pharmacological VTE prophylaxis to orthopedic surgery and she will be started on Lovenox today; currently on SCDs and will -PT OT evaluating but formal note is not put in yet so do not know other recommendations -Dr. [name] recommending it when she is less confused and recommends weightbearing as tolerated in the  right lower extremity -Discharge planning either to SNF as PT/OT recommending this; PT OT reconsulted for reevaluation -Dr. Martha ClanKrasinski evaluated and there is no evidence of increased surgical site bleeding at this time. -[facility] going [facility] -PT OT recommending SNF and will need a bed offer and family just like to SNF prior to discharging.  Acute Post-Operative Delirium, improved and is superimposed in the setting of undiagnosed dementia -Postoperatively and likely secondary to anesthesia -Haloperidol 2.5 mg IV every 6 as needed agitation -Reorient frequently and will place on delirium precautions -If not improving will consider head MRI given  that she just recently had a head CT scan and will do further work-up with a TSH, RPR, vitamin B12 level -Delirium is now improved but she continues to sundown intermittently -We will need outpatient follow-up with neurology  Essential Hypertension -On hydrochlorothiazide PTA only which will be held -Blood pressure currently stable and since she is going to surgery will hold off and place her on IV -hydralazine as needed for systolic blood pressure more than 160 -Now on Metoprolol Tartrate 12.5 mg p.o. twice daily as well -Hold off on HCTZ  Also because of hypokalemia -Initially she was placed on a Cardizem drip when she went into A. fib with RVR and given a 10 mg bolus however this was ineffective and she became slightly hypotensive so this was stopped -Last BP reading was 135/56  Hyperlipidemia -LFTs normal on admission with an AST of 28 and ALT of 12; now AST is 20, ALT is 13 -Continue home Atorvastatin 40 mg po qHS and Omega-3 Acid Ethyl Esters 1 gram po Daily with Lunch  Leukocytosis, improved -Mild likely reactive and is improved  -Patient WBC trended up to 11.5 and is now 5.5 today; if continues to worsen we will panculture -Likely in the setting of her surgery -Continue to monitor for signs and symptoms of infection -Repeat CBC in a.m.  Hypokalemia  -K+ on Admission was 3.3 and today is improved after she is getting repletion yesterday and is now 4.3 -Replete with potassium chloride 40 mg twice daily x2 doses yesterday -Continue to Monitor and Replete as Necessary -Repeat BMP in the AM   Depression/Anxiety -C/w Escitalopram 5 mg po BID -Pharmacy has verified the patient takes clonazepam 0.5 mg 4 times daily and this will be resumed  Acute Blood Loss Anemia on Chronic Normocytic Anemia -Patient's Hgb/Hct went from 9.4/29.6 -> 10.1/30.1 and dropped to 8.4/26.3 after surgery and yesterday was 7.2/22.7; she was typed and screened and given 1 unit of pRBC's and now Hgb/Hct  trended up to 9.9/29.6 but is now slowly trending down is now 9.1/28.6 -? If Further Drop could have been dilutional given that she was receiving IVF with NS at 75 mL/hr but IVF now stopped  -Anemia panel was checked and showed an iron level of 32, U IBC 150, TIBC 182, saturation ratios of 18%, ferritin level 194, folate level of 8.2 vitamin B12 level was 128 -Continue to Monitor for S/Sx of Bleeding closely now that she is on Heparin gtt; Currently no overt bleeding noted -She was also started on Enoxaparin 40 mg sq q24h -Orthopedic Surgery started the patient on ferrous sulfate 325 mg p.o. 3 times daily  -As expected she did have a drop some post-operatively  -Repeat CBC in the AM  Vitamin B12 Deficiency -Patient's vitamin B12 level was checked and was 128 and low -Given cyanocobalamin 1000 mcg intramuscular injection  once and started p.o. supplementation this AM  Hyperglycemia  -Paitent's Blood Sugar on Admission was 135 and repeat was 87 this a.m. -Check HbA1c was 5.3 -Continue to Monitor Blood Sugars carefully and if necessary will place on Sensitive Novolog SSI  New Onset A Fib with RVR s/p Conversion to NSR -Transferred to PCU -Started Cardizem Drip and Bolus but was ineffective so Cardiology changed her to an Amiodarone gtt and bolus. Converted to NSR so Cardiology has changed the patient to p.o. Amiodarone 200 mg twice daily -Start Heparin gtt with no bolus given Hgb of 7.2 and now is improved to 9.1; heparin drip is being continued but this is likely going to be changed to Eliquis per cardiology and this will be done today  -EKG confirmed with Rates in the 160's but now in NSR -Checked CXR, Troponins, and ECHOCardiogram -ECHO showed "Left Ventricle: Left ventricular ejection fraction, by estimation, is 65 to 70%. The left ventricle has normal function. The left ventricle has no regional wall motion abnormalities. The left ventricular internal cavity size was normal in size. There  is  no left ventricular hypertrophy. Left ventricular diastolic parameters  are consistent with Grade I diastolic dysfunction (impaired relaxation)." -TSH was 1.369 -Cardiology Consulted for further evaluation and Recommendations and recommending Diuresis and changing to po Apixaban -Continue with Eliquis and amiodarone per cardiology recommendations -Cardiology is also started metoprolol 12.5 mg p.o. twice daily and are recommending also consolidating Lopressor to Toprol-XL at discharge  AKI -Mild and Improved -Presented with a BUN/Cr of 24/1.05 and and after IVF Hydration and now Diuresis has improved and BUN/Cr is now 24/3.84 -Getting diuresis and will avoid further nephrotoxic medications, contrast dyes, hypotension and renally dose medications -Continue to monitor and trend and repeat renal function in the AM  Acquired Thrombophilia -In the Setting of Atrial Fibrillation -CHA2DS2-VASc at least a 4-6 for Age, Sex, and HTN but now likely even more elevated given her history of heart failure -Anticoagulating with Heparin as above this is being changed to p.o. Eliquis by cardiology  Acute Respiratory Failure with Hypoxia -In the setting of acute Diastolic CHF -SpO2: 96 % O2 Flow Rate (L/min): 1 L/min  -We will stop IV Lasix now given that she appears euvolemic -Chest x-ray from today as below -You cannot exclude a pneumonia despite that she is afebrile has no leukocytosis we will start her on empiric antibiotics with doxycycline IV ceftriaxone -Flutter Valve and Incentive Spirometry -Continue supplemental oxygen via nasal cannula and wean O2 as tolerated -Continuous pulse oximetry and maintain O2 saturations greater than 92% -We will need an ambulatory home O2 screen prior to discharge -I started the patient on breathing treatments with Atrovent and Xopenex every 8 and this was changed to as needed -Continue monitor respiratory status and wean O2 as tolerated  LLL Pneumonia with  Small Pleural Effusion -Chest x-ray yesterday showed consolidation in the Left Base and a small left pleural effusion; chest x-ray today showed "Cardiac enlargement. Infiltration or atelectasis in the lung bases with small left pleural effusion." -Empirically started antibiotics with IV doxycycline and IV ceftriaxone given that she continues to have an oxygen requirement despite being diuresed; continue with empiric antibiotics and will treat for at least 5 days -Breathing treatments provided as above -Start guaifenesin 1200 g p.o. twice daily next-flutter valve and incentive spirometry -Continue with diuresis  Hyponatremia -Mild in the setting of diuresis -Patient sodium is now 134 -Continue monitor and trend and repeat CMP in the a.m.  Acute  Diastolic CHF -Unknown type at this time his echo still pending -In the setting of hypotension, anemia, and volume repletion -BNP was elevated at 477.4 -Chest x-ray today showed "Cardiac enlargement. Bilateral perihilar infiltration or atelectasis with consolidation in the left base and small left pleural effusion. Similar appearance to previous study."  -Strict I's and O's, daily weights  -patient was given a dose of IV Lasix 20 mg 9/24, 9/25, and Received another dose of IV Lasix 40 mg 9/26 and will give another Dose of IV Lasix 40 mg yesterday 927 but will not give any further Lasix dosing -Will Fluid Restrict to 1500 mL  -Patient is + 1494 mL since admission and Weight is down 3 lbs but not repeated -Continue to monitor for signs and symptoms of volume overload  Elevated Troponin -Possibly demand ischemia but activity troponin trended up to 1523 and is now trending down with the last check being 827 -Denies any chest pain -Echocardiogram is ordered and pending to be done -Initially cardiology added aspirin 81 mg p.o. daily and also added low-dose beta-blocker with Toprol tartrate 12.5 mg p.o. twice daily -Currently on anticoagulation with a  heparin drip for likely will be changed to Eliquis per cardiology  DVT prophylaxis: SCDs; Heparin gtt being transitioned to Eliquis  Code Status: DO NOT RESUSCITATE  Family Communication: Spoke to daughter at bedside Disposition Plan: Pending Ortho clearance as well as evaluation by PT and OT; Now needs Cardiac Clearance as well prior to safe discharge disposition and diuresis back to dry weight and weaned off of oxygen: Anticipating discharge in the next 1 to 2 days  Status is: Inpatient  Remains inpatient appropriate because:Unsafe d/c plan, IV treatments appropriate due to intensity of illness or inability to take PO and Inpatient level of care appropriate due to severity of illness   Dispo: The patient is from: Home              Anticipated d/c is to: SNF vs Home with Home Health PT/OT              Anticipated d/c date is: 1-2 days              Patient currently is not medically stable to d/c.  Getting close to being medically stable as she needs to be weaned off her oxygen and have a amatory home O2 screen prior  Consultants:   Orthopedic Surgery Dr. Juanell Fairly   Cardiology Dr. Newman Nickels  Procedures: INTRAMEDULLARY FIXATION OF RIGHT INTERTROCHANTERIC HIP FRACTURE POD 6 by Dr. Rico Ala  Antimicrobials:  Anti-infectives (From admission, onward)   Start     Dose/Rate Route Frequency Ordered Stop   03/15/20 1100  doxycycline (VIBRAMYCIN) 100 mg in sodium chloride 0.9 % 250 mL IVPB        100 mg 125 mL/hr over 120 Minutes Intravenous Every 12 hours 03/15/20 1030     03/15/20 1000  cefTRIAXone (ROCEPHIN) 1 g in sodium chloride 0.9 % 100 mL IVPB        1 g 200 mL/hr over 30 Minutes Intravenous Every 24 hours 03/15/20 0852     03/15/20 0900  azithromycin (ZITHROMAX) 500 mg in sodium chloride 0.9 % 250 mL IVPB  Status:  Discontinued        500 mg 250 mL/hr over 60 Minutes Intravenous Every 24 hours 03/15/20 0852 03/15/20 1030   03/10/20 2000  ceFAZolin (ANCEF) IVPB  2g/100 mL premix        2 g 200 mL/hr  over 30 Minutes Intravenous Every 6 hours 03/10/20 1657 03/11/20 0641   03/10/20 1431  gentamicin 80 mg in 0.9% sodium chloride 250 mL irrigation  Status:  Discontinued          As needed 03/10/20 1431 03/10/20 1502   03/10/20 1300  ceFAZolin (ANCEF) IVPB 2g/100 mL premix        2 g 200 mL/hr over 30 Minutes Intravenous 30 min pre-op 03/10/20 1220 03/10/20 1310   03/10/20 1250  ceFAZolin (ANCEF) 2-4 GM/100ML-% IVPB       Note to Pharmacy: Agnes Lawrence  : cabinet override      03/10/20 1250 03/10/20 1332        Subjective: Seen and examined at bedside and she is working with therapy sitting at the edge of the bed.  Thinks that her shortness of breath is doing a bit better but continues to stop her cough.  No nausea or vomiting.  Still remains on supplemental oxygen via nasal cannula.  Denies any chest pain, lightheadedness or dizziness.  No other concerns or complaints at this time.  Objective: Vitals:   03/16/20 0346 03/16/20 0732 03/16/20 0934 03/16/20 1158  BP: (!) 144/58 (!) 131/58  (!) 135/56  Pulse: (!) 54 (!) 56 65 (!) 59  Resp: 18 17  19   Temp:  (!) 97.5 F (36.4 C)  97.8 F (36.6 C)  TempSrc:  Oral  Oral  SpO2: 100% 99%  96%  Weight:      Height:        Intake/Output Summary (Last 24 hours) at 03/16/2020 1449 Last data filed at 03/16/2020 1345 Gross per 24 hour  Intake 1014.27 ml  Output --  Net 1014.27 ml   Filed Weights   03/09/20 1428 03/12/20 1823  Weight: 56 kg 54.4 kg   Examination: Physical Exam:  Constitutional: The patient is a thin elderly Caucasian female currently in no acute distress and still appears a little dyspneic and remaining on oxygen sedated as of the bed.  She appears calm comfortable about to work with physical therapy. Eyes: Lids and conjunctivae normal, sclerae anicteric  ENMT: External Ears, Nose appear normal. Grossly normal hearing. Neck: Appears normal, supple, no cervical masses, normal  ROM, no appreciable thyromegaly; no appreciable JVD Respiratory: Diminished to auscultation bilaterally with coarse breath sounds and some crackles.  Continues with supplemental oxygen via nasal cannula. Cardiovascular: RRR, no murmurs / rubs / gallops. S1 and S2 auscultated.  No appreciable lower extremity edema noted   Abdomen: Soft, non-tender, non-distended. Bowel sounds positive.  GU: Deferred. Musculoskeletal: No clubbing / cyanosis of digits/nails. No joint deformity upper and lower extremities.  Skin: No rashes, lesions, ulcers on limited skin evaluation. No induration; Warm and dry.  Neurologic: CN 2-12 grossly intact with no focal deficits. Romberg sign and cerebellar reflexes not assessed.  Psychiatric: Normal judgment and insight. Alert and oriented x 3. Normal mood and appropriate affect.   Data Reviewed: I have personally reviewed following labs and imaging studies  CBC: Recent Labs  Lab 03/12/20 0607 03/13/20 0257 03/14/20 0623 03/15/20 0513 03/16/20 0332  WBC 7.4 8.3 6.8 7.2 5.5  NEUTROABS 5.7 5.8 4.9 4.8 2.8  HGB 7.2* 9.1* 9.9* 9.6* 9.1*  HCT 22.7* 27.5* 29.6* 28.3* 28.6*  MCV 87.0 86.5 86.3 85.8 89.1  PLT 198 232 216 244 260   Basic Metabolic Panel: Recent Labs  Lab 03/12/20 0607 03/13/20 0257 03/14/20 0623 03/15/20 0513 03/16/20 0332  NA 136 135 135 135 134*  K  3.2* 4.3 3.6 3.2* 4.3  CL 102 102 99 96* 96*  CO2 GLUCOSE 82 110* 88 87 99  BUN 24*  CREATININE 0.62 0.70 0.63 0.77 0.84  CALCIUM 7.9* 7.9* 8.1* 8.1* 8.1*  MG 2.1 1.8 1.8 1.7 2.0  PHOS 2.9 3.0 3.0 4.1 3.5   GFR: Estimated Creatinine Clearance: 42.3 mL/min (by C-G formula based on SCr of 0.84 mg/dL). Liver Function Tests: Recent Labs  Lab 03/12/20 0607 03/13/20 0257 03/14/20 0623 03/15/20 0513 03/16/20 0332  AST 19 34 ALT ALKPHOS 47 57 62 57 66  BILITOT 0.7 0.8 0.9 0.8 0.8  PROT 5.5* 5.6* 5.8* 5.5* 5.6*  ALBUMIN 2.2* 2.2* 2.2*  2.1* 2.1*   No results for input(s): LIPASE, AMYLASE in the last 168 hours. No results for input(s): AMMONIA in the last 168 hours. Coagulation Profile: Recent Labs  Lab 03/09/20 1823 03/12/20 1804  INR 1.1 1.2   Cardiac Enzymes: No results for input(s): CKTOTAL, CKMB, CKMBINDEX, TROPONINI in the last 168 hours. BNP (last 3 results) No results for input(s): PROBNP in the last 8760 hours. HbA1C: No results for input(s): HGBA1C in the last 72 hours. CBG: No results for input(s): GLUCAP in the last 168 hours. Lipid Profile: Recent Labs    03/13/20 2159  CHOL 98  HDL 33*  LDLCALC 48  TRIG 84  CHOLHDL 3.0   Thyroid Function Tests: Recent Labs    03/14/20 0623  TSH 1.369   Anemia Panel: No results for input(s): VITAMINB12, FOLATE, FERRITIN, TIBC, IRON, RETICCTPCT in the last 72 hours. Sepsis Labs: No results for input(s): PROCALCITON, LATICACIDVEN in the last 168 hours.  Recent Results (from the past 240 hour(s))  SARS Coronavirus 2 by RT PCR (hospital order, performed in [facility] hospital lab) Nasopharyngeal Nasopharyngeal Swab     Status: None   Collection Time: 03/09/20  2:31 PM   Specimen: Nasopharyngeal Swab  Result Value Ref Range Status   SARS Coronavirus 2 NEGATIVE NEGATIVE Final    Comment: (NOTE) SARS-CoV-2 target nucleic acids are NOT DETECTED.  The SARS-CoV-2 RNA is generally detectable in upper and lower respiratory specimens during the acute phase of infection. The lowest concentration of SARS-CoV-2 viral copies this assay can detect is 250 copies / mL. A negative result does not preclude SARS-CoV-2 infection and should not be used as the sole basis for treatment or other patient management decisions.  A negative result may occur with improper specimen collection / handling, submission of specimen other than nasopharyngeal swab, presence of viral mutation(s) within the areas targeted by this assay, and inadequate number of viral copies (<250 copies  / mL). A negative result must be combined with clinical observations, patient history, and epidemiological information.  Fact Sheet for Patients:   BoilerBrush.com.cy  Fact Sheet for Healthcare Providers: https://pope.com/  This test is not yet approved or  cleared by the [place] FDA and has been authorized for detection and/or diagnosis of SARS-CoV-2 by FDA under an Emergency Use Authorization (EUA).  This EUA will remain in effect (meaning this test can be used) for the duration of the COVID-19 declaration under Section 564(b)(1) of the Act, 21 U.S.C. section 360bbb-3(b)(1), unless the authorization is terminated or revoked sooner.  Performed at [facility], [address] [place], [place] [address]   Surgical PCR screen     Status: Abnormal   Collection Time: 03/09/20  9:25 PM   Specimen: Nasal Mucosa; Nasal Swab  Result Value Ref Range Status   MRSA, PCR NEGATIVE NEGATIVE Final   Staphylococcus aureus POSITIVE (A) NEGATIVE Final    Comment: (NOTE) The Xpert SA Assay (FDA approved for NASAL specimens in patients 74 years of age and older), is one component of a comprehensive surveillance program. It is not intended to diagnose infection nor to guide or monitor treatment. Performed at [facility], [address] [place], [place] [address]     RN Pressure Injury Documentation:     Estimated body mass index is 22.67 kg/m as calculated from the following:   Height as of this encounter:  (1.549 m).   Weight as of this encounter: 54.4 kg.  Malnutrition Type:      Malnutrition Characteristics:      Nutrition Interventions:    Radiology Studies: DG Chest 1 View  Result Date: 03/16/2020 CLINICAL DATA:  Shortness of breath.  Fell yesterday EXAM: CHEST  1 VIEW COMPARISON:  03/15/2020 FINDINGS: Postoperative changes in the mediastinum. Cardiac enlargement. Infiltration or atelectasis in  the lung bases, greater on the left, with small left pleural effusion. Prominent thoracic scoliosis convex towards the right. No change since previous study. IMPRESSION: Cardiac enlargement. Infiltration or atelectasis in the lung bases with small left pleural effusion. Electronically Signed   By: Burman Nieves M.D.   On: 03/16/2020 03:57   DG Chest 1 View  Result Date: 03/15/2020 CLINICAL DATA:  Shortness of breath EXAM: CHEST  1 VIEW COMPARISON:  03/14/2020 FINDINGS: Postoperative changes in the mediastinum. Diffuse cardiac enlargement. No vascular congestion. Bilateral perihilar infiltration or atelectasis. Consolidation in the left base with small left pleural effusion. Prominent thoracic scoliosis convex towards the right. IMPRESSION: Cardiac enlargement. Bilateral perihilar infiltration or atelectasis with consolidation in the left base and small left pleural effusion. Similar appearance to previous study. Electronically Signed   By: Burman Nieves M.D.   On: 03/15/2020 05:50   Scheduled Meds: . amiodarone  200 mg Oral BID  . apixaban  5 mg Oral BID  . aspirin  81 mg Oral Daily  . atorvastatin  40 mg Oral Q supper  . clonazePAM  0.5 mg Oral QID  . docusate sodium  100 mg Oral BID  . escitalopram  5 mg Oral Daily  . ferrous sulfate  325 mg Oral TID PC  . guaiFENesin  1,200 mg Oral BID  . influenza vaccine adjuvanted  0.5 mL Intramuscular Tomorrow-1000  . metoprolol tartrate  12.5 mg Oral BID  . multivitamin with minerals  1 tablet Oral Daily  . omega-3 acid ethyl esters  1 g Oral Q lunch  . psyllium  1 packet Oral Daily  . senna  1 tablet Oral BID  . sodium chloride flush  3 mL Intravenous Q12H  . traMADol  100 mg Oral TID  . vitamin B-12  1,000 mcg Oral Daily   Continuous Infusions: . cefTRIAXone (ROCEPHIN)  IV 1 g (03/16/20 0936)  . doxycycline (VIBRAMYCIN) IV 100 mg (03/16/20 1206)  . methocarbamol (ROBAXIN) IV      LOS: 7 days   Merlene Laughter, DO [facility] PAGER is on AMION  If 7PM-7AM, please contact night-coverage [email or web address]

## 2020-03-16 NOTE — Progress Notes (Addendum)
Subjective:  POD #6 s/p IM fracture for IT hip fracture.   Patient reports right hip pain as mild.  Patient OOB to a chair.  Daughter at the bedside.  Objective:   VITALS:   Vitals:   03/16/20 0346 03/16/20 0732 03/16/20 0934 03/16/20 1158  BP: (!) 144/58 (!) 131/58  (!) 135/56  Pulse: (!) 54 (!) 56 65 (!) 59  Resp: 18 17  19   Temp:  (!) 97.5 F (36.4 C)  97.8 F (36.6 C)  TempSrc:  Oral  Oral  SpO2: 100% 99%  96%  Weight:      Height:        PHYSICAL EXAM: Right lower extremity Neurovascular intact Sensation intact distally Intact pulses distally Dorsiflexion/Plantar flexion intact Incision: dressing C/D/I No cellulitis present Compartment soft  LABS  Results for orders placed or performed during the hospital encounter of 03/09/20 (from the past 24 hour(s))  CBC with Differential/Platelet     Status: Abnormal   Collection Time: 03/16/20  3:32 AM  Result Value Ref Range   WBC 5.5 4.0 - 10.5 K/uL   RBC 3.21 (L) 3.87 - 5.11 MIL/uL   Hemoglobin 9.1 (L) 12.0 - 15.0 g/dL   HCT 03/18/20 (L) 36 - 46 %   MCV 89.1 80.0 - 100.0 fL   MCH 28.3 26.0 - 34.0 pg   MCHC 31.8 30.0 - 36.0 g/dL   RDW 82.6 41.5 - 83.0 %   Platelets 260 150 - 400 K/uL   nRBC 0.0 0.0 - 0.2 %   Neutrophils Relative % 50 %   Neutro Abs 2.8 1.7 - 7.7 K/uL   Lymphocytes Relative 32 %   Lymphs Abs 1.7 0.7 - 4.0 K/uL   Monocytes Relative 12 %   Monocytes Absolute 0.6 0 - 1 K/uL   Eosinophils Relative 4 %   Eosinophils Absolute 0.2 0 - 0 K/uL   Basophils Relative 1 %   Basophils Absolute 0.0 0 - 0 K/uL   Immature Granulocytes 1 %   Abs Immature Granulocytes 0.06 0.00 - 0.07 K/uL  Comprehensive metabolic panel     Status: Abnormal   Collection Time: 03/16/20  3:32 AM  Result Value Ref Range   Sodium 134 (L) 135 - 145 mmol/L   Potassium 4.3 3.5 - 5.1 mmol/L   Chloride 96 (L) 98 - 111 mmol/L   CO2 31 22 - 32 mmol/L   Glucose, Bld 99 70 - 99 mg/dL   BUN 24 (H) 8 - 23 mg/dL   Creatinine, Ser 03/18/20 0.44  - 1.00 mg/dL   Calcium 8.1 (L) 8.9 - 10.3 mg/dL   Total Protein 5.6 (L) 6.5 - 8.1 g/dL   Albumin 2.1 (L) 3.5 - 5.0 g/dL   AST 20 15 - 41 U/L   ALT 13 0 - 44 U/L   Alkaline Phosphatase 66 38 - 126 U/L   Total Bilirubin 0.8 0.3 - 1.2 mg/dL   GFR calc non Af Amer >60 >60 mL/min   GFR calc Af Amer >60 >60 mL/min   Anion gap 7 5 - 15  Phosphorus     Status: None   Collection Time: 03/16/20  3:32 AM  Result Value Ref Range   Phosphorus 3.5 2.5 - 4.6 mg/dL  Magnesium     Status: None   Collection Time: 03/16/20  3:32 AM  Result Value Ref Range   Magnesium 2.0 1.7 - 2.4 mg/dL    DG Chest 1 View  Result Date: 03/16/2020 CLINICAL DATA:  Shortness of breath.  Fell yesterday EXAM: CHEST  1 VIEW COMPARISON:  03/15/2020 FINDINGS: Postoperative changes in the mediastinum. Cardiac enlargement. Infiltration or atelectasis in the lung bases, greater on the left, with small left pleural effusion. Prominent thoracic scoliosis convex towards the right. No change since previous study. IMPRESSION: Cardiac enlargement. Infiltration or atelectasis in the lung bases with small left pleural effusion. Electronically Signed   By: Burman Nieves M.D.   On: 03/16/2020 03:57   DG Chest 1 View  Result Date: 03/15/2020 CLINICAL DATA:  Shortness of breath EXAM: CHEST  1 VIEW COMPARISON:  03/14/2020 FINDINGS: Postoperative changes in the mediastinum. Diffuse cardiac enlargement. No vascular congestion. Bilateral perihilar infiltration or atelectasis. Consolidation in the left base with small left pleural effusion. Prominent thoracic scoliosis convex towards the right. IMPRESSION: Cardiac enlargement. Bilateral perihilar infiltration or atelectasis with consolidation in the left base and small left pleural effusion. Similar appearance to previous study. Electronically Signed   By: Burman Nieves M.D.   On: 03/15/2020 05:50    Assessment/Plan: 6 Days Post-Op   Active Problems:   Generalized anxiety disorder   HLD  (hyperlipidemia)   Fracture   Essential hypertension   Hypokalemia   Acute delirium   Leukocytosis   Left lower lobe pneumonia   Atrial fibrillation with rapid ventricular response [facility])  Patient doing well from an orthopaedic standpoint.  She may be weightbearing as tolerated on the right lower extremity. Continue eliquis.  Follow up with Dr. [name] in the office in 7-10 days.  Family continues to ask about getting the patient a J&J Covid vaccine.      Juanell Fairly , MD 03/16/2020, 12:13 PM

## 2020-03-16 NOTE — TOC Progression Note (Signed)
Transition of Care [facility]) - Progression Note    Patient Details  Name: RAMSHA LONIGRO MRN: [id number] Date of Birth: May 26, 1941  [facility]) CM/SW Contact  [name], RN Phone Number: 03/16/2020, 9:31 AM  Clinical Narrative:    Left voicemail with second daughter on list [name] to return call to discuss STR bed offers.    Expected Discharge Plan: Skilled Nursing Facility Barriers to Discharge: No Barriers Identified  Expected Discharge Plan and Services Expected Discharge Plan: Skilled Nursing Facility   Discharge Planning Services: CM Consult Post Acute Care Choice: Skilled Nursing Facility Living arrangements for the past 2 months: Single Family Home                                       Social Determinants of Health (SDOH) Interventions    Readmission Risk Interventions No flowsheet data found.

## 2020-03-17 DIAGNOSIS — I480 Paroxysmal atrial fibrillation: Secondary | ICD-10-CM

## 2020-03-17 DIAGNOSIS — I10 Essential (primary) hypertension: Secondary | ICD-10-CM

## 2020-03-17 DIAGNOSIS — S72001D Fracture of unspecified part of neck of right femur, subsequent encounter for closed fracture with routine healing: Secondary | ICD-10-CM

## 2020-03-17 LAB — RESPIRATORY PANEL BY RT PCR (FLU A&B, COVID)
Influenza A by PCR: NEGATIVE
Influenza B by PCR: NEGATIVE
SARS Coronavirus 2 by RT PCR: NEGATIVE

## 2020-03-17 LAB — COMPREHENSIVE METABOLIC PANEL
ALT: 13 U/L (ref 0–44)
AST: 20 U/L (ref 15–41)
Albumin: 2.1 g/dL — ABNORMAL LOW (ref 3.5–5.0)
Alkaline Phosphatase: 69 U/L (ref 38–126)
Anion gap: 7 (ref 5–15)
BUN: 21 mg/dL (ref 8–23)
CO2: 26 mmol/L (ref 22–32)
Calcium: 8.4 mg/dL — ABNORMAL LOW (ref 8.9–10.3)
Chloride: 101 mmol/L (ref 98–111)
Creatinine, Ser: 0.74 mg/dL (ref 0.44–1.00)
GFR calc Af Amer: >60 mL/min (ref >60)
GFR calc non Af Amer: >60 mL/min (ref >60)
Glucose, Bld: 96 mg/dL (ref 70–99)
Potassium: 3.5 mmol/L (ref 3.5–5.1)
Sodium: 134 mmol/L — ABNORMAL LOW (ref 135–145)
Total Bilirubin: 0.6 mg/dL (ref 0.3–1.2)
Total Protein: 5.6 g/dL — ABNORMAL LOW (ref 6.5–8.1)

## 2020-03-17 LAB — CBC WITH DIFFERENTIAL/PLATELET
Abs Immature Granulocytes: 0.13 10*3/uL — ABNORMAL HIGH (ref 0.00–0.07)
Basophils Absolute: 0 10*3/uL (ref 0.0–0.1)
Basophils Relative: 1 %
Eosinophils Absolute: 0.2 10*3/uL (ref 0.0–0.5)
Eosinophils Relative: 3 %
HCT: 29.1 % — ABNORMAL LOW (ref 36.0–46.0)
Hemoglobin: 9.6 g/dL — ABNORMAL LOW (ref 12.0–15.0)
Immature Granulocytes: 2 %
Lymphocytes Relative: 25 %
Lymphs Abs: 1.6 10*3/uL (ref 0.7–4.0)
MCH: 28.4 pg (ref 26.0–34.0)
MCHC: 33 g/dL (ref 30.0–36.0)
MCV: 86.1 fL (ref 80.0–100.0)
Monocytes Absolute: 0.6 10*3/uL (ref 0.1–1.0)
Monocytes Relative: 10 %
Neutro Abs: 3.8 10*3/uL (ref 1.7–7.7)
Neutrophils Relative %: 59 %
Platelets: 260 10*3/uL (ref 150–400)
RBC: 3.38 MIL/uL — ABNORMAL LOW (ref 3.87–5.11)
RDW: 14.7 % (ref 11.5–15.5)
WBC: 6.3 10*3/uL (ref 4.0–10.5)
nRBC: 0 % (ref 0.0–0.2)

## 2020-03-17 LAB — PHOSPHORUS: Phosphorus: 3.4 mg/dL (ref 2.5–4.6)

## 2020-03-17 LAB — MAGNESIUM: Magnesium: 1.7 mg/dL (ref 1.7–2.4)

## 2020-03-17 MED ORDER — TRAMADOL HCL 50 MG PO TABS
100.0000 mg | ORAL_TABLET | Freq: Three times a day (TID) | ORAL | 0 refills | Status: DC
Start: 2020-03-17 — End: 2020-07-28

## 2020-03-17 MED ORDER — APIXABAN 5 MG PO TABS: 5.0000 mg | ORAL_TABLET | Freq: Two times a day (BID) | ORAL | Status: DC

## 2020-03-17 MED ORDER — CYANOCOBALAMIN 1000 MCG PO TABS: 1000.0000 ug | ORAL_TABLET | Freq: Every day | ORAL | Status: DC

## 2020-03-17 MED ORDER — FERROUS SULFATE 325 (65 FE) MG PO TABS: 325.0000 mg | ORAL_TABLET | Freq: Every day | ORAL | Status: DC

## 2020-03-17 MED ORDER — MAGNESIUM SULFATE 2 GM/50ML IV SOLN
2.0000 g | Freq: Once | INTRAVENOUS | Status: AC
  Administered 2020-03-17: 2 g via INTRAVENOUS
  Filled 2020-03-17: qty 50

## 2020-03-17 MED ORDER — METOPROLOL TARTRATE 25 MG PO TABS: 12.5000 mg | ORAL_TABLET | Freq: Two times a day (BID) | ORAL | Status: DC

## 2020-03-17 MED ORDER — CLONAZEPAM 0.5 MG PO TABS: 0.5000 mg | ORAL_TABLET | Freq: Four times a day (QID) | ORAL | 0 refills | Status: DC

## 2020-03-17 MED ORDER — AMIODARONE HCL 200 MG PO TABS: ORAL_TABLET | ORAL | Status: DC

## 2020-03-17 MED ORDER — CEFUROXIME AXETIL 500 MG PO TABS: 500.0000 mg | ORAL_TABLET | Freq: Two times a day (BID) | ORAL | 0 refills | Status: AC

## 2020-03-17 MED ORDER — POTASSIUM CHLORIDE CRYS ER 20 MEQ PO TBCR
40.0000 meq | EXTENDED_RELEASE_TABLET | Freq: Once | ORAL | Status: AC
  Administered 2020-03-17: 40 meq via ORAL
  Filled 2020-03-17: qty 2

## 2020-03-17 NOTE — Progress Notes (Signed)
Report called to [facility] with nurse Marylene Land Time allowed for questions and concerns.  Verbalizes an understanding Denies any additional questions or concerns at this tine Medical transport ETA 1600

## 2020-03-17 NOTE — Progress Notes (Signed)
Progress Note  Patient Name: Michelle Bauer Date of Encounter: 03/17/2020  [facility] Cardiologist: Julien Nordmann, MD   Subjective   Denies chest pain or shortness of breath.  Has right hip discomfort when she moves.  Inpatient Medications    Scheduled Meds: . amiodarone  200 mg Oral BID  . apixaban  5 mg Oral BID  . aspirin  81 mg Oral Daily  . atorvastatin  40 mg Oral Q supper  . clonazePAM  0.5 mg Oral QID  . docusate sodium  100 mg Oral BID  . escitalopram  5 mg Oral Daily  . ferrous sulfate  325 mg Oral TID PC  . guaiFENesin  1,200 mg Oral BID  . influenza vaccine adjuvanted  0.5 mL Intramuscular Tomorrow-1000  . metoprolol tartrate  12.5 mg Oral BID  . multivitamin with minerals  1 tablet Oral Daily  . omega-3 acid ethyl esters  1 g Oral Q lunch  . psyllium  1 packet Oral Daily  . senna  1 tablet Oral BID  . sodium chloride flush  3 mL Intravenous Q12H  . traMADol  100 mg Oral TID  . vitamin B-12  1,000 mcg Oral Daily   Continuous Infusions: . cefTRIAXone (ROCEPHIN)  IV 1 g (03/17/20 0958)  . doxycycline (VIBRAMYCIN) IV 100 mg (03/16/20 2325)  . methocarbamol (ROBAXIN) IV     PRN Meds: acetaminophen, alum & mag hydroxide-simeth, bisacodyl, bisacodyl, haloperidol lactate, hydrALAZINE, HYDROcodone-acetaminophen, ipratropium, levalbuterol, magnesium citrate, menthol-cetylpyridinium **OR** phenol, methocarbamol **OR** methocarbamol (ROBAXIN) IV, morphine injection, ondansetron **OR** ondansetron (ZOFRAN) IV, polyethylene glycol, promethazine   Vital Signs    Vitals:   03/16/20 1602 03/16/20 2004 03/17/20 0318 03/17/20 0827  BP: (!) 137/47 133/66 120/60 (!) 166/61  Pulse: 70 71 68 67  Resp: 17 20 20    Temp: (!) 97.5 F (36.4 C) 97.7 F (36.5 C) 98.2 F (36.8 C) 98.4 F (36.9 C)  TempSrc: Oral Oral Oral Oral  SpO2: 94% 100% 91% 91%  Weight:      Height:        Intake/Output Summary (Last 24 hours) at 03/17/2020 1029 Last data filed at 03/17/2020  1027 Gross per 24 hour  Intake 600 ml  Output --  Net 600 ml   Last 3 Weights 03/13/2020 03/12/2020 03/09/2020  Weight (lbs) (No Data) 120 lb 123 lb 7.3 oz  Weight (kg) (No Data) 54.432 kg 56 kg  Some encounter information is confidential and restricted. Go to Review Flowsheets activity to see all data.      Telemetry    Sinus rhythm- Personally Reviewed  ECG    New tracing obtained- Personally Reviewed  Physical Exam   GEN: No acute distress.   Neck: No JVD Cardiac: RRR,2/6 systolic murmur, rubs, or gallops.  Respiratory: Clear to auscultation bilaterally. GI: Soft, nontender, non-distended  MS: No edema; No deformity. Neuro:  Nonfocal  Psych: Normal affect   Labs    High Sensitivity Troponin:   Recent Labs  Lab 03/12/20 1804 03/13/20 1022 03/13/20 1214  TROPONINIHS 1,523* 1,093* 827*      Chemistry Recent Labs  Lab 03/15/20 0513 03/16/20 0332 03/17/20 0527  NA 135 134* 134*  K 3.2* 4.3 3.5  CL 96* 96* 101  CO2 31 31 26   GLUCOSE 87 99 96  BUN 18 24* 21  CREATININE 0.77 0.84 0.74  CALCIUM 8.1* 8.1* 8.4*  PROT 5.5* 5.6* 5.6*  ALBUMIN 2.1* 2.1* 2.1*  AST 22 20 20   ALT 13 13  13  ALKPHOS 57 66 69  BILITOT 0.8 0.8 0.6  GFRNONAA >60 >60 >60  GFRAA >60 >60 >60  ANIONGAP 8 7 7      Hematology Recent Labs  Lab 03/15/20 0513 03/16/20 0332 03/17/20 0527  WBC 7.2 5.5 6.3  RBC 3.30* 3.21* 3.38*  HGB 9.6* 9.1* 9.6*  HCT 28.3* 28.6* 29.1*  MCV 85.8 89.1 86.1  MCH 29.1 28.3 28.4  MCHC 33.9 31.8 33.0  RDW 14.4 14.7 14.7  PLT 244 260 260    BNP Recent Labs  Lab 03/13/20 0257  BNP 477.4*     DDimer No results for input(s): DDIMER in the last 168 hours.   Radiology    DG Chest 1 View  Result Date: 03/16/2020 CLINICAL DATA:  Shortness of breath. EXAM: CHEST  1 VIEW COMPARISON:  March 16, 2020 (3:24 a.m.) FINDINGS: Multiple sternal wires and vascular clips are noted. Persistent mild areas of atelectasis and/or infiltrate are seen within the  bilateral lung bases, left greater than right. A small, stable left pleural effusion is seen. No pneumothorax is identified. There is stable mild to moderate severity enlargement of the cardiac silhouette. Marked severity dextroscoliosis of the thoracic spine is seen. IMPRESSION: 1. Persistent mild bibasilar atelectasis and/or infiltrate, left greater than right. 2. Stable small left pleural effusion. Electronically Signed   By: March 18, 2020 M.D.   On: 03/16/2020 19:36   DG Chest 1 View  Result Date: 03/16/2020 CLINICAL DATA:  Shortness of breath.  Fell yesterday EXAM: CHEST  1 VIEW COMPARISON:  03/15/2020 FINDINGS: Postoperative changes in the mediastinum. Cardiac enlargement. Infiltration or atelectasis in the lung bases, greater on the left, with small left pleural effusion. Prominent thoracic scoliosis convex towards the right. No change since previous study. IMPRESSION: Cardiac enlargement. Infiltration or atelectasis in the lung bases with small left pleural effusion. Electronically Signed   By: 03/17/2020 M.D.   On: 03/16/2020 03:57    Cardiac Studies   Echo 03/13/20 1. Left ventricular ejection fraction, by estimation, is 65 to 70%. The left ventricle has normal function. The left ventricle has no regional wall motion abnormalities. Left ventricular diastolic parameters are consistent with Grade I diastolic dysfunction (impaired relaxation). 2. Right ventricular systolic function is normal. The right ventricular size is normal. 3. Left atrial size was mildly dilated. 4. Right atrial size was mild to moderately dilated. 5. The mitral valve is abnormal. Mild mitral valve regurgitation. 6. The aortic valve is abnormal. Aortic valve regurgitation is mild. Mild aortic valve sclerosis is present, with no evidence of aortic valve stenosis.  Patient Profile     79 y.o. female with history of hypertension, hyperlipidemia, scoliosis presenting with mechanical fall and right hip  fracture status post surgical repair being seen for A. fib RVR.  Currently in sinus rhythm  Assessment & Plan    1.  Paroxysmal A. Fib -Currently in sinus rhythm -Continue amiodarone 200 twice daily x1 week, decrease to 200 mg daily after -CHA2DS2-VASc of 4 -Eliquis 5 mg twice daily  2.  Hypertension -BP controlled -Lopressor, amiodarone  3.  Hip fracture status post repair -Management as per surgical and primary team  Total encounter time 35 minutes  Greater than 50% was spent in counseling and coordination of care with the patient       Signed, 63, MD  03/17/2020, 10:29 AM

## 2020-03-17 NOTE — Progress Notes (Signed)
Physical Therapy Treatment Patient Details Name: Michelle Bauer MRN: [id number] DOB: Jan 22, 1941 Today's Date: 03/17/2020    History of Present Illness Pt is a 79 y.o. female with medical history significant of 79 year old female with past medical history of hypertension, hyperlipidemia, anxiety/depression, severe scoliosis, diverticular bleed presented status post fall.  Patient was on her usual health and her daughter was coming to visit her.  She went to unlock to double lock since her daughter did not have the key to the door and she reports a chair was pulled out in the kitchen and somehow she stumbled over it and fell.  She did hit her head against a wall.  Daughter arrived 30 minutes later meanwhile patient was on the floor.  Pt initially refused to come to the ER, but could not walk.  Found to have R hip fx and is now s/p ORIF 9/22.    PT Comments    Pt was pleasant and motivated to participate during the session. Pt noted increased pain with bed therex today but was determined to complete therapy. Pt was able to come to sitting on EOB with improved functionality and required MinA for trunk management. Pt's SpO2 on room air while sitting was 92%. Pt was able to sit EOB for 5 minutes for gown and linen changing. Pt was able to come to standing with MinA support but needed to sit down secondary to pain but was able to ambulate 2 feet with MinA and RW to chair to sit down. Pt agreed to ambulate again and was able to ambulate 5 feet x 3 with RW and Min guard  with improved walker sequencing, improved weight shifting towards R LE, and improved balance. Pt will benefit from PT services in a SNF setting upon discharge to safely address deficits listed in patient problem list for decreased caregiver assistance and eventual return to PLOF.    Follow Up Recommendations  SNF     Equipment Recommendations  Other (comment) (to be determined at next level of care)    Recommendations for Other Services        Precautions / Restrictions Precautions Precautions: Fall Restrictions Weight Bearing Restrictions: Yes RLE Weight Bearing: Weight bearing as tolerated Other Position/Activity Restrictions: monitor O2 as pt is being weaned from O2    Mobility  Bed Mobility Overal bed mobility: Needs Assistance Bed Mobility: Supine to Sit     Supine to sit: Min assist        Transfers Overall transfer level: Needs assistance Equipment used: Rolling walker (2 wheeled) Transfers: Sit to/from Stand Sit to Stand: Min assist         General transfer comment: pt required MinA and increased time. pt able to place more weight through R LE  Ambulation/Gait Ambulation/Gait assistance: Min Assist Gait Distance (Feet): 5 Feet x 3, 2 feet x 1 Assistive device: Rolling walker (2 wheeled) Gait Pattern/deviations: Decreased weight shift to right;Trunk flexed;Step-to pattern;Decreased stance time - right Gait velocity: decreased   General Gait Details: pt shows improved ability to tolerate weight shiting towards R LE and improved balance. pt requires min cuing for walker sequencing   Stairs             Wheelchair Mobility    Modified Rankin (Stroke Patients Only)       Balance Overall balance assessment: Needs assistance Sitting-balance support: Feet unsupported;Bilateral upper extremity supported Sitting balance-Leahy Scale: Good Sitting balance - Comments: pt demonstrates L lateral lean but is able to main sitting balance  while moving the bed up and down Postural control: Left lateral lean   Standing balance-Leahy Scale: Fair Standing balance comment: pt uses bilateral UE support for balance but does not requires pt assist for balance. pt shows greater ability to weight shift on to R LE                            Cognition Arousal/Alertness: Awake/alert Behavior During Therapy: WFL for tasks assessed/performed Overall Cognitive Status: Within Functional Limits  for tasks assessed                         Following Commands: Follows multi-step commands consistently              Exercises Total Joint Exercises Ankle Circles/Pumps: AROM;Both;10 reps Quad Sets: AROM;Both;10 reps Heel Slides: AAROM;Right;10 reps (AROM L 10) Straight Leg Raises: AROM;Left;5 reps (exercise to painful to continue today)    General Comments        Pertinent Vitals/Pain Pain Score: 0-No pain Faces Pain Scale: Hurts little more Pain Location: no pain at rest but has pain in R LE with movement and weightbearing Pain Descriptors / Indicators: Sore;Operative site guarding Pain Intervention(s): Limited activity within patient's tolerance;Monitored during session;Repositioned    Home Living Family/patient expects to be discharged to:: Skilled nursing facility                    Prior Function            PT Goals (current goals can now be found in the care plan section) Acute Rehab PT Goals Patient Stated Goal: go home PT Goal Formulation: With patient Time For Goal Achievement: 03/26/20 Potential to Achieve Goals: Fair Progress towards PT goals: Progressing toward goals    Frequency    BID      PT Plan Current plan remains appropriate    Co-evaluation              AM-PAC PT "6 Clicks" Mobility   Outcome Measure  Help needed turning from your back to your side while in a flat bed without using bedrails?: A Little Help needed moving from lying on your back to sitting on the side of a flat bed without using bedrails?: A Lot Help needed moving to and from a bed to a chair (including a wheelchair)?: A Lot Help needed standing up from a chair using your arms (e.g., wheelchair or bedside chair)?: A Lot Help needed to walk in hospital room?: A Little Help needed climbing 3-5 steps with a railing? : A Lot 6 Click Score: 14    End of Session Equipment Utilized During Treatment: Gait belt Activity Tolerance: Patient tolerated  treatment well Patient left: with nursing/sitter in room;in chair;with call bell/phone within reach;with family/visitor present Nurse Communication: Mobility status (nursing notified about IV blockage and pt need of purewick) PT Visit Diagnosis: Muscle weakness (generalized) (M62.81);Difficulty in walking, not elsewhere classified (R26.2);Pain Pain - Right/Left: Right Pain - part of body: Hip     Time: 8416-6063 PT Time Calculation (min) (ACUTE ONLY): 45 min  Charges:                        Nicolette Bang, SPT 03/17/20. 11:16 AM

## 2020-03-17 NOTE — Discharge Summary (Addendum)
Physician Discharge Summary  Michelle Bauer ZOX:[id number] DOB: 1940/09/11 DOA: 03/09/2020  PCP: [name], MD  Admit date: 03/09/2020 Discharge date: 03/17/2020  Discharge disposition: Skilled nursing facility   Recommendations for Outpatient Follow-Up:   Follow-up with Dr. [name], orthopedic surgeon in 1 week Follow-up with [facility] health cardiologist in 2 weeks   Discharge Diagnosis:   Principal Problem:   Closed right hip fracture [facility]) Active Problems:   Generalized anxiety disorder   HLD (hyperlipidemia)   Essential hypertension   Hypokalemia   Acute delirium   Leukocytosis   Left lower lobe pneumonia   Atrial fibrillation with rapid ventricular response (HCC)   Paroxysmal A-fib (HCC)    Discharge Condition: Stable.  Diet recommendation:  Diet Order            Diet - low sodium heart healthy           Diet regular Room service appropriate? Yes; Fluid consistency: Thin; Fluid restriction: 1500 mL Fluid  Diet effective now                   Code Status: DNR     Hospital Course:   Michelle Bauer a 79 y.o.femalewith medical history significant of79 year old female with past medical history of hypertension, hyperlipidemia, anxiety/depression, severe scoliosis, diverticular bleed who was brought to the hospital after mechanical fall.  She was found to have acute right proximal femur fracture.  She was treated with analgesics and was seen in consultation by orthopedic surgeon.  She underwent intramedullary fixation of right hip fracture on 03/10/2020.  Hospital course was complicated by acute delirium and atrial fibrillation with RVR.  She required IV Cardizem and IV amiodarone infusion for rapid A. fib.  Cardiologist was consulted to assist with management of rapid A. fib.  She has been started on Eliquis for stroke prophylaxis.  Chest x-ray showed bibasilar infiltrate/atelectasis and there was concern for pneumonia versus CHF.  She was  treated with IV Lasix and empiric IV antibiotics.  She required oxygen via nasal cannula for acute hypoxemic respiratory failure but she has been successfully weaned off of oxygen and she is tolerating room air.  She was evaluated by PT and OT who recommended further rehabilitation at a skilled nursing facility.  Discharge plan was discussed with the patient and her daughter, [name] (over the phone).    Medical Consultants:    Orthopedic surgeon, Dr. [name]  Cardiologist, [facility]   Discharge Exam:    Vitals:   03/16/20 2004 03/17/20 0318 03/17/20 0827 03/17/20 1201  BP: 133/66 120/60 (!) 166/61 140/81  Pulse: 71 68 67 67  Resp: Temp: 97.7 F (36.5 C) 98.2 F (36.8 C) 98.4 F (36.9 C) 99.6 F (37.6 C)  TempSrc: Oral Oral Oral Oral  SpO2: 100% 91% 91% 92%  Weight:      Height:         GEN: NAD SKIN: No rash EYES: EOMI ENT: MMM CV: RRR PULM: CTA B ABD: soft, ND, NT, +BS CNS: AAO x 3, non focal EXT: Mild right hip swelling and tenderness.  Right hip surgical wound is clean and dry without any bleeding.   The results of significant diagnostics from this hospitalization (including imaging, microbiology, ancillary and laboratory) are listed below for reference.     Procedures and Diagnostic Studies:   DG Chest 1 View  Result Date: 03/14/2020 CLINICAL DATA:  Shortness of breath EXAM: CHEST  1 VIEW COMPARISON:  03/13/2020 and prior radiographs FINDINGS: Cardiomegaly and CABG changes are again identified. LEFT LOWER lung consolidation/atelectasis and LEFT pleural effusion are again noted. There is no evidence of pneumothorax. There has been little interval change since the prior study. A severe thoracic scoliosis is again noted. IMPRESSION: Unchanged appearance of the chest with LEFT LOWER lung consolidation/atelectasis and LEFT pleural effusion. Electronically Signed   By: Harmon Pier M.D.   On: 03/14/2020 07:39   DG Chest 1 View  Result Date:  03/13/2020 CLINICAL DATA:  Hypoxia, recent hip fracture and repair EXAM: CHEST  1 VIEW COMPARISON:  03/12/2020 FINDINGS: Cardiac shadow is enlarged but stable. Postsurgical changes are again seen. Stable left-sided pleural effusion and likely underlying atelectasis is present. Significant scoliosis is seen. Mild residual vascular congestion remains. IMPRESSION: Overall stable appearance of the chest when compare with the previous exam. Electronically Signed   By: Alcide Clever M.D.   On: 03/13/2020 12:51   ECHOCARDIOGRAM COMPLETE  Result Date: 03/14/2020    ECHOCARDIOGRAM REPORT   Patient Name:   Michelle Bauer Date of Exam: 03/13/2020 Medical Rec #:  [id number]      Height:       61.0 in Accession #:    [id number]     Weight:       120.0 lb Date of Birth:  17-Jul-1940     BSA:          1.520 m Patient Age:    79 years       BP:           158/69 mmHg Patient Gender: F              HR:           79 bpm. Exam Location:  [facility] Procedure: 2D Echo Indications:     Atrial Fibrillation 427.31 / I48.91  History:         Patient has no prior history of Echocardiogram examinations.                  CAD; Risk Factors:Hypertension and Dyslipidemia.  Sonographer:     Johnathan Hausen Referring Phys:  [id number] Iba Diagnosing Phys: Dietrich Pates MD IMPRESSIONS  1. Left ventricular ejection fraction, by estimation, is 65 to 70%. The left ventricle has normal function. The left ventricle has no regional wall motion abnormalities. Left ventricular diastolic parameters are consistent with Grade I diastolic dysfunction (impaired relaxation).  2. Right ventricular systolic function is normal. The right ventricular size is normal.  3. Left atrial size was mildly dilated.  4. Right atrial size was mild to moderately dilated.  5. The mitral valve is abnormal. Mild mitral valve regurgitation.  6. The aortic valve is abnormal. Aortic valve regurgitation is mild. Mild aortic valve sclerosis is present, with no evidence of aortic valve  stenosis. FINDINGS  Left Ventricle: Left ventricular ejection fraction, by estimation, is 65 to 70%. The left ventricle has normal function. The left ventricle has no regional wall motion abnormalities. The left ventricular internal cavity size was normal in size. There is  no left ventricular hypertrophy. Left ventricular diastolic parameters are consistent with Grade I diastolic dysfunction (impaired relaxation). Right Ventricle: The right ventricular size is normal. Right vetricular wall thickness was not assessed. Right ventricular systolic function is normal. Left Atrium: Left atrial size was mildly dilated. Right Atrium: Right atrial size was mild to moderately dilated. Pericardium: Trivial pericardial effusion is present. Mitral Valve: The mitral valve is abnormal. There  is mild thickening of the mitral valve leaflet(s). There is mild calcification of the mitral valve leaflet(s). Mild mitral valve regurgitation. Tricuspid Valve: The tricuspid valve is normal in structure. Tricuspid valve regurgitation is mild. Aortic Valve: The aortic valve is abnormal. Aortic valve regurgitation is mild. Mild aortic valve sclerosis is present, with no evidence of aortic valve stenosis. Pulmonic Valve: The pulmonic valve was normal in structure. Pulmonic valve regurgitation is trivial. Aorta: The aortic root is normal in size and structure. Venous: The inferior vena cava was not well visualized. IAS/Shunts: The interatrial septum was not assessed.  LEFT VENTRICLE PLAX 2D LVIDd:         4.06 cm  Diastology LVIDs:         2.28 cm  LV e' medial:   5.77 cm/s LV PW:         0.81 cm  LV E/e' medial: 16.4 LV IVS:        0.91 cm LVOT diam:     1.90 cm LVOT Area:     2.84 cm  RIGHT VENTRICLE RV S prime:     14.60 cm/s TAPSE (M-mode): 2.1 cm LEFT ATRIUM             Index       RIGHT ATRIUM           Index LA diam:        4.80 cm 3.16 cm/m  RA Area:     23.20 cm LA Vol (A2C):   67.4 ml 44.34 ml/m RA Volume:   70.90 ml  46.64 ml/m  LA Vol (A4C):   58.2 ml 38.29 ml/m LA Biplane Vol: 62.2 ml 40.92 ml/m   AORTA Ao Root diam: 3.60 cm MITRAL VALVE                TRICUSPID VALVE MV Area (PHT): 2.76 cm     TR Peak grad:   49.6 mmHg MV Decel Time: 275 msec     TR Vmax:        352.00 cm/s MV E velocity: 94.90 cm/s MV A velocity: 103.00 cm/s  SHUNTS MV E/A ratio:  0.92         Systemic Diam: 1.90 cm Dietrich Pates MD Electronically signed by Dietrich Pates MD Signature Date/Time: 03/14/2020/3:03:13 PM    Final      Labs:   Basic Metabolic Panel: Recent Labs  Lab 03/13/20 0257 03/13/20 0257 03/14/20 0174 03/14/20 9449 03/15/20 6759 03/15/20 0513 03/16/20 0332 03/17/20 0527  NA 135  --  135  --  135  --  134* 134*  K 4.3   < > 3.6   < > 3.2*   < > 4.3 3.5  CL 102  --  99  --  96*  --  96* 101  CO2 27  --  28  --  31  --  31 26  GLUCOSE 110*  --  88  --  87  --  99 96  BUN 23  --  19  --  18  --  24* 21  CREATININE 0.70  --  0.63  --  0.77  --  0.84 0.74  CALCIUM 7.9*  --  8.1*  --  8.1*  --  8.1* 8.4*  MG 1.8  --  1.8  --  1.7  --  2.0 1.7  PHOS 3.0  --  3.0  --  4.1  --  3.5 3.4   < > = values in this interval  not displayed.   GFR Estimated Creatinine Clearance: 44.4 mL/min (by C-G formula based on SCr of 0.74 mg/dL). Liver Function Tests: Recent Labs  Lab 03/13/20 0257 03/14/20 0623 03/15/20 0513 03/16/20 0332 03/17/20 0527  AST 34 21 22 20 20   ALT 15 12 13 13 13   ALKPHOS 57 62 57 66 69  BILITOT 0.8 0.9 0.8 0.8 0.6  PROT 5.6* 5.8* 5.5* 5.6* 5.6*  ALBUMIN 2.2* 2.2* 2.1* 2.1* 2.1*   No results for input(s): LIPASE, AMYLASE in the last 168 hours. No results for input(s): AMMONIA in the last 168 hours. Coagulation profile Recent Labs  Lab 03/12/20 1804  INR 1.2    CBC: Recent Labs  Lab 03/13/20 0257 03/14/20 0623 03/15/20 0513 03/16/20 0332 03/17/20 0527  WBC 8.3 6.8 7.2 5.5 6.3  NEUTROABS 5.8 4.9 4.8 2.8 3.8  HGB 9.1* 9.9* 9.6* 9.1* 9.6*  HCT 27.5* 29.6* 28.3* 28.6* 29.1*  MCV 86.5 86.3 85.8 89.1  86.1  PLT 232 216 244 260 260   Cardiac Enzymes: No results for input(s): CKTOTAL, CKMB, CKMBINDEX, TROPONINI in the last 168 hours. BNP: Invalid input(s): POCBNP CBG: No results for input(s): GLUCAP in the last 168 hours. D-Dimer No results for input(s): DDIMER in the last 72 hours. Hgb A1c No results for input(s): HGBA1C in the last 72 hours. Lipid Profile No results for input(s): CHOL, HDL, LDLCALC, TRIG, CHOLHDL, LDLDIRECT in the last 72 hours. Thyroid function studies No results for input(s): TSH, T4TOTAL, T3FREE, THYROIDAB in the last 72 hours.  Invalid input(s): FREET3 Anemia work up No results for input(s): VITAMINB12, FOLATE, FERRITIN, TIBC, IRON, RETICCTPCT in the last 72 hours. Microbiology Recent Results (from the past 240 hour(s))  SARS Coronavirus 2 by RT PCR (hospital order, performed in [facility] hospital lab) Nasopharyngeal Nasopharyngeal Swab     Status: None   Collection Time: 03/09/20  2:31 PM   Specimen: Nasopharyngeal Swab  Result Value Ref Range Status   SARS Coronavirus 2 NEGATIVE NEGATIVE Final    Comment: (NOTE) SARS-CoV-2 target nucleic acids are NOT DETECTED.  The SARS-CoV-2 RNA is generally detectable in upper and lower respiratory specimens during the acute phase of infection. The lowest concentration of SARS-CoV-2 viral copies this assay can detect is 250 copies / mL. A negative result does not preclude SARS-CoV-2 infection and should not be used as the sole basis for treatment or other patient management decisions.  A negative result may occur with improper specimen collection / handling, submission of specimen other than nasopharyngeal swab, presence of viral mutation(s) within the areas targeted by this assay, and inadequate number of viral copies (<250 copies / mL). A negative result must be combined with clinical observations, patient history, and epidemiological information.  Fact Sheet for Patients:    [facility]  Fact Sheet for Healthcare Providers: 03/11/20  This test is not yet approved or  cleared by the [employer] FDA and has been authorized for detection and/or diagnosis of SARS-CoV-2 by FDA under an Emergency Use Authorization (EUA).  This EUA will remain in effect (meaning this test can be used) for the duration of the COVID-19 declaration under Section 564(b)(1) of the Act, 21 U.S.C. section 360bbb-3(b)(1), unless the authorization is terminated or revoked sooner.  Performed at [facility], [address], [place], [address] [place]   Surgical PCR screen     Status: Abnormal   Collection Time: 03/09/20  9:25 PM   Specimen: Nasal Mucosa; Nasal Swab  Result Value Ref Range Status  MRSA, PCR NEGATIVE NEGATIVE Final   Staphylococcus aureus POSITIVE (A) NEGATIVE Final    Comment: (NOTE) The Xpert SA Assay (FDA approved for NASAL specimens in patients 78 years of age and older), is one component of a comprehensive surveillance program. It is not intended to diagnose infection nor to guide or monitor treatment. Performed at [facility], [address] [place], [place] [address]      Discharge Instructions:   Discharge Instructions    Diet - low sodium heart healthy   Complete by: As directed    Discharge wound care:   Complete by: As directed    Keep surgical wounds clean and dry and follow-up with orthopedic surgeon for staple removal   Increase activity slowly   Complete by: As directed      Allergies as of 03/17/2020      Reactions   Other Other (See Comments)   Penicillins Other (See Comments)   Has patient had a PCN reaction causing immediate rash, facial/tongue/throat swelling, SOB or lightheadedness with hypotension: Unknown Has patient had a PCN reaction causing severe rash involving mucus membranes or skin necrosis: Unknown Has patient had a PCN reaction that  required hospitalization: Unknown Has patient had a PCN reaction occurring within the last 10 years: No If all of the above answers are "NO", then may proceed with Cephalosporin use.   Sulfa Antibiotics Other (See Comments)      Medication List    STOP taking these medications   aspirin 81 MG EC tablet     TAKE these medications   amiodarone 200 MG tablet Commonly known as: PACERONE Amiodarone 200 mg twice a day for 1 week followed by 200 mg once a day thereafter   apixaban 5 MG Tabs tablet Commonly known as: ELIQUIS Take 1 tablet (5 mg total) by mouth 2 (two) times daily.   cefUROXime 500 MG tablet Commonly known as: CEFTIN Take 1 tablet (500 mg total) by mouth 2 (two) times daily with a meal for 2 days.   clonazePAM 0.5 MG tablet Commonly known as: KLONOPIN Take 1 tablet (0.5 mg total) by mouth 4 (four) times daily. TAKE 1 TABLET(0.5 MG) BY MOUTH FOUR TIMES DAILY What changed:   how much to take  how to take this  when to take this   cyanocobalamin 1000 MCG tablet Take 1 tablet (1,000 mcg total) by mouth daily. Start taking on: March 18, 2020   escitalopram 5 MG tablet Commonly known as: LEXAPRO TAKE 1 TABLET(5 MG) BY MOUTH DAILY   ferrous sulfate 325 (65 FE) MG tablet Take 1 tablet (325 mg total) by mouth daily with breakfast.   metoprolol tartrate 25 MG tablet Commonly known as: LOPRESSOR Take 0.5 tablets (12.5 mg total) by mouth 2 (two) times daily.   One-A-Day Womens 50 Plus Tabs Take 1 tablet by mouth daily.   Potassium Gluconate 550 MG Tabs Take 1,100 mg by mouth daily.   traMADol 50 MG tablet Commonly known as: ULTRAM Take 2 tablets (100 mg total) by mouth in the morning, at noon, and at bedtime.            Discharge Care Instructions  (From admission, onward)         Start     Ordered   03/17/20 0000  Discharge wound care:       Comments: Keep surgical wounds clean and dry and follow-up with orthopedic surgeon for staple removal     03/17/20 1204  Contact information for follow-up providers    Juanell Fairly, MD. Schedule an appointment as soon as possible for a visit in 1 week(s).   Specialty: Orthopedic Surgery Contact information: [address] [place] [address] [phone]        Debbe Odea, MD. Schedule an appointment as soon as possible for a visit in 2 week(s).   Specialties: Cardiology, Radiology Contact information: [address] [place] [address] [phone]            Contact information for after-discharge care    Destination    [facility] .   Service: Skilled Nursing Contact information: [address] [place] [address] [phone]                   Time coordinating discharge: 32 minutes  Signed:  Lurene Shadow  [facility] 03/17/2020, 1:41 PM   Pager on [email or web address]. If 7PM-7AM, please contact night-coverage at [email or web address]

## 2020-03-29 ENCOUNTER — Ambulatory Visit (INDEPENDENT_AMBULATORY_CARE_PROVIDER_SITE_OTHER): Payer: Medicare Other | Admitting: Family

## 2020-03-29 ENCOUNTER — Other Ambulatory Visit: Payer: Self-pay

## 2020-03-29 ENCOUNTER — Encounter: Payer: Self-pay | Admitting: Family

## 2020-03-29 VITALS — BP 140/70 | HR 66 | Ht 60.0 in

## 2020-03-29 DIAGNOSIS — I10 Essential (primary) hypertension: Secondary | ICD-10-CM | POA: Diagnosis not present

## 2020-03-29 DIAGNOSIS — Z7901 Long term (current) use of anticoagulants: Secondary | ICD-10-CM | POA: Diagnosis not present

## 2020-03-29 DIAGNOSIS — I480 Paroxysmal atrial fibrillation: Secondary | ICD-10-CM | POA: Diagnosis not present

## 2020-03-29 MED ORDER — APIXABAN 5 MG PO TABS: 5.0000 mg | ORAL_TABLET | Freq: Two times a day (BID) | ORAL | 1 refills | Status: DC

## 2020-03-29 MED ORDER — METOPROLOL TARTRATE 25 MG PO TABS: 12.5000 mg | ORAL_TABLET | Freq: Two times a day (BID) | ORAL | 1 refills | Status: DC

## 2020-03-29 MED ORDER — AMIODARONE HCL 200 MG PO TABS: 200.0000 mg | ORAL_TABLET | Freq: Every day | ORAL | 1 refills | Status: AC

## 2020-03-29 NOTE — Patient Instructions (Signed)
Medication Instructions:  Your physician recommends that you continue on your current medications as directed. Please refer to the Current Medication list given to you today.  *If you need a refill on your cardiac medications before your next appointment, please call your pharmacy*   Lab Work: None ordered If you have labs (blood work) drawn today and your tests are completely normal, you will receive your results only by: Marland Kitchen MyChart Message (if you have MyChart) OR . A paper copy in the mail If you have any lab test that is abnormal or we need to change your treatment, we will call you to review the results.   Testing/Procedures: None ordered   Follow-Up: At [facility], you and your health needs are our priority.  As part of our continuing mission to provide you with exceptional heart care, we have created designated Provider Care Teams.  These Care Teams include your primary Cardiologist (physician) and Advanced Practice Providers (APPs -  Physician Assistants and Nurse Practitioners) who all work together to provide you with the care you need, when you need it.  We recommend signing up for the patient portal called "MyChart".  Sign up information is provided on this After Visit Summary.  MyChart is used to connect with patients for Virtual Visits (Telemedicine).  Patients are able to view lab/test results, encounter notes, upcoming appointments, etc.  Non-urgent messages can be sent to your provider as well.   To learn more about what you can do with MyChart, go to [email or web address].    Your next appointment:   6 week(s)  The format for your next appointment:   In Person  Provider:   You may see Julien Nordmann, MD or one of the following Advanced Practice Providers on your designated Care Team:    Nicolasa Ducking, NP  Eula Listen, PA-C  Marisue Ivan, PA-C  Cadence Fransico Michael, [place]    Other Instructions N/A

## 2020-03-29 NOTE — Progress Notes (Signed)
Office Visit    Patient Name: Michelle Bauer Date of Encounter: 03/29/2020  Primary Care Provider:  [name], MD Primary Cardiologist:  Julien Nordmann, MD Electrophysiologist:  None   Chief Complaint    Michelle Bauer is a 79 y.o. female with a hx of HTN, HLD, scoliosis, hip fracture with postoperative atrial fibrillation with RVR presents today for hospital follow-up  Past Medical History    Past Medical History:  Diagnosis Date  . Anxiety   . Depression   . Essential hypertension   . Hyperlipidemia    Past Surgical History:  Procedure Laterality Date  . ABDOMINAL HYSTERECTOMY    . BYPASS AXILLA/BRACHIAL ARTERY    . CHOLECYSTECTOMY    . COLONOSCOPY WITH PROPOFOL N/A 03/07/2018   Procedure: COLONOSCOPY WITH PROPOFOL;  Surgeon: Midge Minium, MD;  Location: [facility] ENDOSCOPY;  Service: Endoscopy;  Laterality: N/A;  . INTRAMEDULLARY (IM) NAIL INTERTROCHANTERIC Right 03/10/2020   Procedure: INTRAMEDULLARY (IM) NAIL INTERTROCHANTRIC;  Surgeon: Juanell Fairly, MD;  Location: [facility] ORS;  Service: Orthopedics;  Laterality: Right;    Allergies  Allergies  Allergen Reactions  . Other Other (See Comments)  . Penicillins Other (See Comments)    Has patient had a PCN reaction causing immediate rash, facial/tongue/throat swelling, SOB or lightheadedness with hypotension: Unknown Has patient had a PCN reaction causing severe rash involving mucus membranes or skin necrosis: Unknown Has patient had a PCN reaction that required hospitalization: Unknown Has patient had a PCN reaction occurring within the last 10 years: No If all of the above answers are "NO", then may proceed with Cephalosporin use.  . Sulfa Antibiotics Other (See Comments)    History of Present Illness    Michelle Bauer is a 79 y.o. female with a hx of HTN, HLD, scoliosis, hip fracture with postoperative atrial fibrillation with RVR  last seen while hospitalized.  Admitted 03/09/20 with acute right  proximal femur fracture. Underwent intramedullary fixation of R hip fracture 03/10/20. Hospital course with atrial fib with RVR. Discharged on metoprolol, amiodarone, Eliquis.   Has been participating in PT/OT at [facility]. She is somewhat frustrated as she is not progressing as quickly as she would like. Presents today for follow up with her daughter. Reports no palpitations. Notes that during her atrial fibrillation in the hospital she noticed more shortness of breath. Reports no recurrence. No chest pain, pressure, tightness. Long discussion regarding atrial fibrillation.   EKGs/Labs/Other Studies Reviewed:   The following studies were reviewed today: Echo 03/13/20  1. Left ventricular ejection fraction, by estimation, is 65 to 70%. The left ventricle has normal function. The left ventricle has no regional wall motion abnormalities. Left ventricular diastolic parameters are consistent with Grade I diastolic dysfunction (impaired relaxation). 2. Right ventricular systolic function is normal. The right ventricular size is normal. 3. Left atrial size was mildly dilated. 4. Right atrial size was mild to moderately dilated. 5. The mitral valve is abnormal. Mild mitral valve regurgitation. 6. The aortic valve is abnormal. Aortic valve regurgitation is mild. Mild aortic valve sclerosis is present, with no evidence of aortic valve stenosis.  EKG:  EKG is  ordered today.  The ekg ordered today demonstrates normal sinus rhythm 66 bpm with no acute ST/T wave changes.  Recent Labs: 03/13/2020: B Natriuretic Peptide 477.4 03/14/2020: TSH 1.369 03/17/2020: ALT 13; BUN 21; Creatinine, Ser 0.74; Hemoglobin 9.6; Magnesium 1.7; Platelets 260; Potassium 3.5; Sodium 134  Recent Lipid Panel    Component Value Date/Time  CHOL 98 03/13/2020 2159   TRIG 84 03/13/2020 2159   HDL 33 (L) 03/13/2020 2159   CHOLHDL 3.0 03/13/2020 2159   VLDL 17 03/13/2020 2159   LDLCALC 48 03/13/2020 2159    Home Medications    Current Meds  Medication Sig  . amiodarone (PACERONE) 200 MG tablet Take 200 mg by mouth daily.  Marland Kitchen apixaban (ELIQUIS) 5 MG TABS tablet Take 1 tablet (5 mg total) by mouth 2 (two) times daily.  Marland Kitchen escitalopram (LEXAPRO) 5 MG tablet TAKE 1 TABLET(5 MG) BY MOUTH DAILY  . ferrous sulfate 325 (65 FE) MG tablet Take 1 tablet (325 mg total) by mouth daily with breakfast.  . Multiple Vitamins-Minerals (ONE-A-DAY WOMENS 50 PLUS) TABS Take 1 tablet by mouth daily.  . Potassium Gluconate 550 MG TABS Take 1,100 mg by mouth daily.  Marland Kitchen senna-docusate (SENOKOT-S) 8.6-50 MG tablet Take 1 tablet by mouth 2 (two) times daily.  . traMADol (ULTRAM) 50 MG tablet Take 2 tablets (100 mg total) by mouth in the morning, at noon, and at bedtime.  . vitamin B-12 1000 MCG tablet Take 1 tablet (1,000 mcg total) by mouth daily.  Marland Kitchen VITAMIN D PO Take 2,000 Int'l Units by mouth daily.      Review of Systems      Review of Systems  Constitutional: Negative for chills, fever and malaise/fatigue.  Cardiovascular: Positive for leg swelling (right ankle). Negative for chest pain, dyspnea on exertion, irregular heartbeat, near-syncope, orthopnea, palpitations and syncope.  Respiratory: Negative for cough, shortness of breath and wheezing.   Musculoskeletal:        (+) R hip pain  Gastrointestinal: Negative for melena, nausea and vomiting.  Genitourinary: Negative for hematuria.  Neurological: Negative for dizziness, light-headedness and weakness.   All other systems reviewed and are otherwise negative except as noted above.  Physical Exam    VS:  BP 140/70 (BP Location: Left Arm, Patient Position: Sitting, Cuff Size: Normal)   Pulse 66   Ht 5' (1.524 m)   SpO2 97%   BMI 23.44 kg/m  , BMI Body mass index is 23.44 kg/m. GEN: Well nourished, well developed, in no acute distress. HEENT: normal. Neck: Supple, no JVD, carotid bruits, or masses. Cardiac: RRR, no murmurs, rubs, or gallops. No clubbing, cyanosis, edema.   Radials/DP/PT 2+ and equal bilaterally.  Respiratory:  Respirations regular and unlabored, clear to auscultation bilaterally. GI: Soft, nontender, nondistended, BS + x 4. MS: No deformity or atrophy. Skin: Warm and dry, no rash. Neuro:  Strength and sensation are intact. Psych: Normal affect.  Assessment & Plan    1. PAF -Maintaining normal sinus rhythm by EKG today.  Denies palpitations.  Continue amiodarone 200 mg daily.  Continue metoprolol titrate 12.5 mg twice daily.  We will plan for amiodarone monitoring labs (CMP, TSH) at her follow-up. Discussed need for annual eye exam.  2. Chronic anticoagulation -Denies bleeding complications.  Continue Eliquis 5 mg twice daily.  Does not meet dose reduction criteria. Plan for CBC at follow up.   3. HTN - BP well controlled. Continue current antihypertensive regimen.  Educated to report BP consistently greater than 130/80.  4. Hip fracture - Presently at SNF.  Working with physical therapy. Anticipate majority of her R ankle edema is due to hip fracture, dependent edema as echo with normal LVEF while hospitalized. Discussed elevating lower extremity and compression stockings.   Disposition: Follow up in 6 week(s) with Dr. Mariah Milling or APP with labs at that time (CBC,  TSH, CMP)  Alver Sorrow, NP 03/29/2020, 11:57 AM

## 2020-05-10 ENCOUNTER — Ambulatory Visit: Payer: Medicare Other | Admitting: Family

## 2020-05-18 ENCOUNTER — Other Ambulatory Visit: Payer: Self-pay

## 2020-05-18 ENCOUNTER — Encounter: Payer: Self-pay | Admitting: Family

## 2020-05-18 ENCOUNTER — Ambulatory Visit (INDEPENDENT_AMBULATORY_CARE_PROVIDER_SITE_OTHER): Payer: Medicare Other | Admitting: Family

## 2020-05-18 VITALS — BP 142/70 | HR 71 | Ht 63.0 in | Wt 129.0 lb

## 2020-05-18 DIAGNOSIS — I480 Paroxysmal atrial fibrillation: Secondary | ICD-10-CM

## 2020-05-18 DIAGNOSIS — Z7901 Long term (current) use of anticoagulants: Secondary | ICD-10-CM

## 2020-05-18 DIAGNOSIS — Z79899 Other long term (current) drug therapy: Secondary | ICD-10-CM

## 2020-05-18 DIAGNOSIS — I10 Essential (primary) hypertension: Secondary | ICD-10-CM

## 2020-05-18 NOTE — Progress Notes (Signed)
Office Visit    Patient Name: Michelle Bauer Date of Encounter: 05/18/2020  Primary Care Provider:  [name], MD Primary Cardiologist:  Julien Nordmann, MD Electrophysiologist:  None   Chief Complaint    Michelle Bauer is a 79 y.o. female with a hx of HTN, HLD, scoliosis, hip fracture with postoperative atrial fibrillation with RVR presents today for follow up of atrial fibrillation.  Past Medical History    Past Medical History:  Diagnosis Date  . Anxiety   . Depression   . Essential hypertension   . Hyperlipidemia    Past Surgical History:  Procedure Laterality Date  . ABDOMINAL HYSTERECTOMY    . BYPASS AXILLA/BRACHIAL ARTERY    . CHOLECYSTECTOMY    . COLONOSCOPY WITH PROPOFOL N/A 03/07/2018   Procedure: COLONOSCOPY WITH PROPOFOL;  Surgeon: Midge Minium, MD;  Location: [facility] ENDOSCOPY;  Service: Endoscopy;  Laterality: N/A;  . INTRAMEDULLARY (IM) NAIL INTERTROCHANTERIC Right 03/10/2020   Procedure: INTRAMEDULLARY (IM) NAIL INTERTROCHANTRIC;  Surgeon: Juanell Fairly, MD;  Location: [facility] ORS;  Service: Orthopedics;  Laterality: Right;    Allergies  Allergies  Allergen Reactions  . Other Other (See Comments)  . Penicillins Other (See Comments)    Has patient had a PCN reaction causing immediate rash, facial/tongue/throat swelling, SOB or lightheadedness with hypotension: Unknown Has patient had a PCN reaction causing severe rash involving mucus membranes or skin necrosis: Unknown Has patient had a PCN reaction that required hospitalization: Unknown Has patient had a PCN reaction occurring within the last 10 years: No If all of the above answers are "NO", then may proceed with Cephalosporin use.  . Sulfa Antibiotics Other (See Comments)    History of Present Illness    Michelle Bauer is a 79 y.o. female with a hx of HTN, HLD, scoliosis, hip fracture with postoperative atrial fibrillation with RVR  last seen 03/29/20.  Admitted 03/09/20 with acute right  proximal femur fracture. Underwent intramedullary fixation of R hip fracture 03/10/20. Hospital course with atrial fib with RVR. Discharged on metoprolol, amiodarone, Eliquis.   She was seen for hospital follow-up 03/29/2020 and maintaining sinus rhythm. She was working with PT/OT at [facility]. She noted no recurrent palpitations or symptoms suggestive of atrial fibrillation.  Presents today for follow-up with her daughter. Denies palpitations, chest pain, pressure, tightness. Reports no shortness of breath at rest and only very mild dyspnea on exertion which is stable at her baseline. She is very pleased with her progress and is now very mobile with the assistance of a Nicha Hemann. She has moved from SNF to [facility] and continues to work with physical and Occupational Therapy. She notes occasional swelling in her right ankle  EKGs/Labs/Other Studies Reviewed:   The following studies were reviewed today: Echo 03/13/20  1. Left ventricular ejection fraction, by estimation, is 65 to 70%. The left ventricle has normal function. The left ventricle has no regional wall motion abnormalities. Left ventricular diastolic parameters are consistent with Grade I diastolic dysfunction (impaired relaxation). 2. Right ventricular systolic function is normal. The right ventricular size is normal. 3. Left atrial size was mildly dilated. 4. Right atrial size was mild to moderately dilated. 5. The mitral valve is abnormal. Mild mitral valve regurgitation. 6. The aortic valve is abnormal. Aortic valve regurgitation is mild. Mild aortic valve sclerosis is present, with no evidence of aortic valve stenosis.  EKG:  EKG is  ordered today.  The ekg ordered today demonstrates normal sinus rhythm 71 bpm with  no acute ST/T wave changes.  Recent Labs: 03/13/2020: B Natriuretic Peptide 477.4 03/14/2020: TSH 1.369 03/17/2020: ALT 13; BUN 21; Creatinine, Ser 0.74; Hemoglobin 9.6; Magnesium 1.7; Platelets 260; Potassium 3.5; Sodium  134  Recent Lipid Panel    Component Value Date/Time   CHOL 98 03/13/2020 2159   TRIG 84 03/13/2020 2159   HDL 33 (L) 03/13/2020 2159   CHOLHDL 3.0 03/13/2020 2159   VLDL 17 03/13/2020 2159   LDLCALC 48 03/13/2020 2159    Home Medications   Current Meds  Medication Sig  . amiodarone (PACERONE) 200 MG tablet Take 1 tablet (200 mg total) by mouth daily.  Marland Kitchen apixaban (ELIQUIS) 5 MG TABS tablet Take 1 tablet (5 mg total) by mouth 2 (two) times daily.  . clonazePAM (KLONOPIN) 0.5 MG tablet Take 1 tablet (0.5 mg total) by mouth 4 (four) times daily. TAKE 1 TABLET(0.5 MG) BY MOUTH FOUR TIMES DAILY  . escitalopram (LEXAPRO) 5 MG tablet TAKE 1 TABLET(5 MG) BY MOUTH DAILY  . ferrous sulfate 325 (65 FE) MG tablet Take 1 tablet (325 mg total) by mouth daily with breakfast.  . metoprolol tartrate (LOPRESSOR) 25 MG tablet Take 0.5 tablets (12.5 mg total) by mouth 2 (two) times daily.  . Multiple Vitamins-Minerals (ONE-A-DAY WOMENS 50 PLUS) TABS Take 1 tablet by mouth daily.  . Potassium Gluconate 550 MG TABS Take 1,100 mg by mouth daily.  Marland Kitchen senna-docusate (SENOKOT-S) 8.6-50 MG tablet Take 1 tablet by mouth 2 (two) times daily.  . traMADol (ULTRAM) 50 MG tablet Take 2 tablets (100 mg total) by mouth in the morning, at noon, and at bedtime.  . vitamin B-12 1000 MCG tablet Take 1 tablet (1,000 mcg total) by mouth daily.  Marland Kitchen VITAMIN D PO Take 2,000 Int'l Units by mouth daily.      Review of Systems     Review of Systems  Constitutional: Negative for chills, fever and malaise/fatigue.  Cardiovascular: Positive for leg swelling (right ankle). Negative for chest pain, dyspnea on exertion, irregular heartbeat, near-syncope, orthopnea, palpitations and syncope.  Respiratory: Negative for cough, shortness of breath and wheezing.   Gastrointestinal: Negative for melena, nausea and vomiting.  Genitourinary: Negative for hematuria.  Neurological: Negative for dizziness, light-headedness and weakness.    All other systems reviewed and are otherwise negative except as noted above.  Physical Exam    VS:  BP (!) 142/70 (BP Location: Left Arm, Patient Position: Sitting, Cuff Size: Normal)   Pulse 71   Ht 5\' 3"  (1.6 m)   Wt 129 lb (58.5 kg)   SpO2 97%   BMI 22.85 kg/m  , BMI Body mass index is 22.85 kg/m. GEN: Well nourished, well developed, in no acute distress. HEENT: normal. Neck: Supple, no JVD, carotid bruits, or masses. Cardiac: RRR, no murmurs, rubs, or gallops. No clubbing, cyanosis, edema.  Radials/DP/PT 2+ and equal bilaterally.  Respiratory:  Respirations regular and unlabored, clear to auscultation bilaterally. GI: Soft, nontender, nondistended, BS + x 4. MS: No deformity or atrophy. Skin: Warm and dry, no rash. Neuro:  Strength and sensation are intact. Psych: Normal affect.  Assessment & Plan    1. PAF/On amiodarone therapy -Maintaining normal sinus rhythm by EKG today.  Denies palpitations.  Continue amiodarone 200 mg daily.  Continue metoprolol titrate 12.5 mg twice daily. Plan for amiodarone monitoring labs including CMP, TSH.   2. Chronic anticoagulation -Denies bleeding complications.  Continue Eliquis 5 mg twice daily.  Does not meet dose reduction criteria. CBC today for  monitoring.  3. HTN - Continue current antihypertensive regimen.  Educated to report BP consistently greater than 130/80.  4. Hip fracture - Since last seen has discharged from SNF to ALF and continues to work with PT/OT.  5. R ankle edema - Echo 03/13/2020 EF 65 to 70%, grade 1 diastolic dysfunction. No edema noted on exam. Reports edema is intermittently worse at the end of the day. History of gout in the right foot and right hip fracture. No signs of cellulitis, no pain to RLE. Recommend elevate lower extremities when sitting, compression stockings.  Disposition: Follow up in 4 month(s) with Dr. Mariah Milling or APP  Alver Sorrow, NP 05/18/2020, 10:12 AM

## 2020-05-18 NOTE — Patient Instructions (Addendum)
Medication Instructions:  No medication changes today.   *If you need a refill on your cardiac medications before your next appointment, please call your pharmacy*  Lab Work: Your provider recommends lab work today: CBC, CMP, TSH  If you have labs (blood work) drawn today and your tests are completely normal, you will receive your results only by: Marland Kitchen MyChart Message (if you have MyChart) OR . A paper copy in the mail If you have any lab test that is abnormal or we need to change your treatment, we will call you to review the results.   Testing/Procedures: Your EKG today shows you are staying in a normal sinus rhythm!  Follow-Up: At [facility], you and your health needs are our priority.  As part of our continuing mission to provide you with exceptional heart care, we have created designated Provider Care Teams.  These Care Teams include your primary Cardiologist (physician) and Advanced Practice Providers (APPs -  Physician Assistants and Nurse Practitioners) who all work together to provide you with the care you need, when you need it.  We recommend signing up for the patient portal called "MyChart".  Sign up information is provided on this After Visit Summary.  MyChart is used to connect with patients for Virtual Visits (Telemedicine).  Patients are able to view lab/test results, encounter notes, upcoming appointments, etc.  Non-urgent messages can be sent to your provider as well.   To learn more about what you can do with MyChart, go to [email or web address].    Your next appointment:   4 month(s)  The format for your next appointment:   In Person  Provider:   You may see Julien Nordmann, MD or one of the following Advanced Practice Providers on your designated Care Team:    Nicolasa Ducking, NP  Eula Listen, PA-C  Marisue Ivan, PA-C  Cadence Evaro, [place]  Gillian Shields, NP

## 2020-05-19 LAB — COMPREHENSIVE METABOLIC PANEL
ALT: 13 IU/L (ref 0–32)
AST: 16 IU/L (ref 0–40)
Albumin/Globulin Ratio: 1 — ABNORMAL LOW (ref 1.2–2.2)
Albumin: 3.7 g/dL (ref 3.7–4.7)
Alkaline Phosphatase: 112 IU/L (ref 44–121)
BUN/Creatinine Ratio: 24 (ref 12–28)
BUN: 20 mg/dL (ref 8–27)
Bilirubin Total: 0.2 mg/dL (ref 0.0–1.2)
CO2: 29 mmol/L (ref 20–29)
Calcium: 9.4 mg/dL (ref 8.7–10.3)
Chloride: 101 mmol/L (ref 96–106)
Creatinine, Ser: 0.85 mg/dL (ref 0.57–1.00)
GFR calc Af Amer: 76 mL/min/{1.73_m2} (ref >59)
GFR calc non Af Amer: 66 mL/min/{1.73_m2} (ref >59)
Globulin, Total: 3.8 g/dL (ref 1.5–4.5)
Glucose: 89 mg/dL (ref 65–99)
Potassium: 3.7 mmol/L (ref 3.5–5.2)
Sodium: 142 mmol/L (ref 134–144)
Total Protein: 7.5 g/dL (ref 6.0–8.5)

## 2020-05-19 LAB — CBC
Hematocrit: 33.8 % — ABNORMAL LOW (ref 34.0–46.6)
Hemoglobin: 11.3 g/dL (ref 11.1–15.9)
MCH: 29.4 pg (ref 26.6–33.0)
MCHC: 33.4 g/dL (ref 31.5–35.7)
MCV: 88 fL (ref 79–97)
Platelets: 274 10*3/uL (ref 150–450)
RBC: 3.84 x10E6/uL (ref 3.77–5.28)
RDW: 14 % (ref 11.7–15.4)
WBC: 5.4 10*3/uL (ref 3.4–10.8)

## 2020-05-19 LAB — TSH: TSH: 1.74 u[IU]/mL (ref 0.450–4.500)

## 2020-07-01 ENCOUNTER — Encounter: Payer: Self-pay | Admitting: Family

## 2020-07-15 ENCOUNTER — Telehealth: Payer: Self-pay | Admitting: Cardiovascular Disease

## 2020-07-15 MED ORDER — APIXABAN 5 MG PO TABS: 5.0000 mg | ORAL_TABLET | Freq: Two times a day (BID) | ORAL | 1 refills | Status: DC

## 2020-07-15 NOTE — Telephone Encounter (Signed)
  [facility] calling in to check if patient should still be talking her eliquis. Patients daughter said it was originally for a procedure and pharmacy will not refill at this time. If patient should be taking it, a new Rx will need to sent to [facility].  Patient also states she no longer takes seena-docusate. Patient states she is regular and does not want to take it anymore  Please advise

## 2020-07-15 NOTE — Telephone Encounter (Signed)
Was able to call [facility] back and spoke with RN Aaliyah regarding pt's Eliquis. Advised that Herbert Seta, RN had spoken with Ms. Seeman on 07/01/2020 explaining that the eliquis is something that she will be on indefinitely to decrease her risk of stroke from the atrial fibrillation that she has a history of. Aaliyah verbalized understanding, requested order to be sent into pharmacy and a fax be sent to [facility] with order as well. Also, stated that pt is no longer taking seena-docusate. Patient states she is regular and does not want to take it anymore, requested in the fax order that this medication to be d/c from her med list. Will have Gillian Shields, NP sign order for Eliquis and to d/c seena-docusate and will fax to [phone]. Aaliyah verbalized understanding, all questions or concerns were address and no additional concerns at this time. Agreeable to plan, will call back for anything further.

## 2020-07-28 ENCOUNTER — Other Ambulatory Visit: Payer: Self-pay | Admitting: Psychiatry

## 2020-07-28 DIAGNOSIS — F411 Generalized anxiety disorder: Secondary | ICD-10-CM

## 2020-07-28 MED ORDER — ESCITALOPRAM OXALATE 5 MG PO TABS
ORAL_TABLET | ORAL | 1 refills | Status: DC
Start: 2020-07-28 — End: 2020-08-26

## 2020-07-28 MED ORDER — CLONAZEPAM 0.5 MG PO TABS: 0.5000 mg | ORAL_TABLET | Freq: Four times a day (QID) | ORAL | 1 refills | Status: DC

## 2020-07-28 MED ORDER — TRAMADOL HCL 50 MG PO TABS
100.0000 mg | ORAL_TABLET | Freq: Three times a day (TID) | ORAL | 5 refills | Status: DC
Start: 2020-07-28 — End: 2020-08-26

## 2020-08-26 ENCOUNTER — Other Ambulatory Visit: Payer: Self-pay

## 2020-08-26 ENCOUNTER — Telehealth (INDEPENDENT_AMBULATORY_CARE_PROVIDER_SITE_OTHER): Payer: Medicare Other | Admitting: Psychiatry

## 2020-08-26 DIAGNOSIS — F332 Major depressive disorder, recurrent severe without psychotic features: Secondary | ICD-10-CM

## 2020-08-26 DIAGNOSIS — F411 Generalized anxiety disorder: Secondary | ICD-10-CM | POA: Diagnosis not present

## 2020-08-26 MED ORDER — CLONAZEPAM 0.5 MG PO TABS: 0.5000 mg | ORAL_TABLET | Freq: Four times a day (QID) | ORAL | 1 refills | Status: DC

## 2020-08-26 MED ORDER — ESCITALOPRAM OXALATE 5 MG PO TABS: ORAL_TABLET | ORAL | 1 refills | Status: DC

## 2020-08-26 MED ORDER — TRAMADOL HCL 50 MG PO TABS
100.0000 mg | ORAL_TABLET | Freq: Three times a day (TID) | ORAL | 5 refills | Status: DC
Start: 2020-08-26 — End: 2021-03-31

## 2020-08-26 NOTE — Progress Notes (Signed)
Virtual Visit via Telephone Note  I connected with Michelle Bauer on 08/26/20 at  1:20 PM EST by telephone and verified that I am speaking with the correct person using two identifiers.  Location: Patient: Home Provider: Hospital   I discussed the limitations, risks, security and privacy concerns of performing an evaluation and management service by telephone and the availability of in person appointments. I also discussed with the patient that there may be a patient responsible charge related to this service. The patient expressed understanding and agreed to proceed.   History of Present Illness: Follow-up for this patient with chronic anxiety and depression.  [facility] home [facility] and has been there for some months.  She tells me that it is going great.  She feels much better being around people and having her needs taken care of.  Mood is described as being upbeat most of the time.  Still very occasionally has crying spells but no suicidal thought and does not sound like she gets any psychotic spells.  Pain is under control.  Still taking the Klonopin and tramadol the same as always without any evidence of any complication or side effects    Observations/Objective: Alert and oriented.  Affect euthymic.  Thoughts lucid.  No delusions or hallucinations.  No suicidal or homicidal ideation.   Assessment and Plan: Stable and doing well on current medication   Follow Up Instructions: Reviewed medication with the patient.  Refilled everything to the current pharmacy used by her assisted living facility.  Follow-up 6 months    I discussed the assessment and treatment plan with the patient. The patient was provided an opportunity to ask questions and all were answered. The patient agreed with the plan and demonstrated an understanding of the instructions.   The patient was advised to call back or seek an in-person evaluation if the symptoms worsen or if the condition fails to improve  as anticipated.  I provided 20 minutes of non-face-to-face time during this encounter.   Mordecai Rasmussen, MD

## 2020-09-13 NOTE — Progress Notes (Deleted)
Cardiology Office Note  Date:  09/13/2020   ID:  Michelle Bauer, DOB 1940-11-05, MRN [id number]  PCP:  [name], MD   No chief complaint on file.   HPI:  Michelle Bauer is a 80 y.o. female with a hx of  Depression HTN, HLD, scoliosis, hip fracture with postoperative atrial fibrillation with RVR presents today for follow up of atrial fibrillation.  Last seen in clinic November 2021  postoperative atrial fibrillation with RVR  last seen 03/29/20.  Admitted 03/09/20 with acute right proximal femur fracture. Underwent intramedullary fixation of R hip fracture 03/10/20. Hospital course with atrial fib with RVR. Discharged on metoprolol, amiodarone, Eliquis.   She was seen for hospital follow-up 03/29/2020 and maintaining sinus rhythm. She was working with PT/OT at [facility]. She noted no recurrent palpitations or symptoms suggestive of atrial fibrillation.  Echo 03/13/20 1. Left ventricular ejection fraction, by estimation, is 65 to 70%. The left ventricle has normal function. The left ventricle has no regional wall motion abnormalities. Left ventricular diastolic parameters are consistent with Grade I diastolic dysfunction (impaired relaxation). 2. Right ventricular systolic function is normal. The right ventricular size is normal. 3. Left atrial size was mildly dilated. 4. Right atrial size was mild to moderately dilated. 5. The mitral valve is abnormal. Mild mitral valve regurgitation. 6. The aortic valve is abnormal. Aortic valve regurgitation is mild. Mild aortic valve sclerosis is present, with no evidence of aortic valve stenosis.  PMH:   has a past medical history of Anxiety, Depression, Essential hypertension, and Hyperlipidemia.  PSH:    Past Surgical History:  Procedure Laterality Date  . ABDOMINAL HYSTERECTOMY    . BYPASS AXILLA/BRACHIAL ARTERY    . CHOLECYSTECTOMY    . COLONOSCOPY WITH PROPOFOL N/A 03/07/2018   Procedure: COLONOSCOPY WITH PROPOFOL;  Surgeon:  Midge Minium, MD;  Location: [facility] ENDOSCOPY;  Service: Endoscopy;  Laterality: N/A;  . INTRAMEDULLARY (IM) NAIL INTERTROCHANTERIC Right 03/10/2020   Procedure: INTRAMEDULLARY (IM) NAIL INTERTROCHANTRIC;  Surgeon: Juanell Fairly, MD;  Location: [facility] ORS;  Service: Orthopedics;  Laterality: Right;    Current Outpatient Medications  Medication Sig Dispense Refill  . acetaminophen (TYLENOL) 500 MG tablet TAKE 2 TABS (1000MG ) BY MOUTH THREE TIMES DAILY    . amiodarone (PACERONE) 200 MG tablet Take 1 tablet (200 mg total) by mouth daily. 90 tablet 1  . apixaban (ELIQUIS) 5 MG TABS tablet Take 1 tablet (5 mg total) by mouth 2 (two) times daily. 180 tablet 1  . clonazePAM (KLONOPIN) 0.5 MG tablet Take 1 tablet (0.5 mg total) by mouth 4 (four) times daily. TAKE 1 TABLET(0.5 MG) BY MOUTH FOUR TIMES DAILY 360 tablet 1  . escitalopram (LEXAPRO) 5 MG tablet TAKE 1 TABLET(5 MG) BY MOUTH DAILY 90 tablet 1  . ferrous sulfate 325 (65 FE) MG tablet Take 1 tablet (325 mg total) by mouth daily with breakfast.    . metoprolol tartrate (LOPRESSOR) 25 MG tablet Take 0.5 tablets (12.5 mg total) by mouth 2 (two) times daily. 90 tablet 1  . Multiple Vitamins-Minerals (ONE-A-DAY WOMENS 50 PLUS) TABS Take 1 tablet by mouth daily.    . Potassium Gluconate 550 MG TABS Take 1,100 mg by mouth daily.    . traMADol (ULTRAM) 50 MG tablet Take 2 tablets (100 mg total) by mouth in the morning, at noon, and at bedtime. 180 tablet 5  . vitamin B-12 1000 MCG tablet Take 1 tablet (1,000 mcg total) by mouth daily.    VITAMIN D  PO Take 2,000 Int'l Units by mouth daily.     No current facility-administered medications for this visit.     Allergies:   Other, Penicillins, and Sulfa antibiotics   Social History:  The patient  reports that she has never smoked. She has never used smokeless tobacco. She reports that she does not drink alcohol and does not use drugs.   Family History:   family history includes Alcohol abuse in her  father; Colon cancer in her sister; Diabetes in her brother; Heart attack in her father; Stroke in her mother and sister.    Review of Systems: ROS   PHYSICAL EXAM: VS:  There were no vitals taken for this visit. , BMI There is no height or weight on file to calculate BMI. GEN: Well nourished, well developed, in no acute distress HEENT: normal Neck: no JVD, carotid bruits, or masses Cardiac: RRR; no murmurs, rubs, or gallops,no edema  Respiratory:  clear to auscultation bilaterally, normal work of breathing GI: soft, nontender, nondistended, + BS MS: no deformity or atrophy Skin: warm and dry, no rash Neuro:  Strength and sensation are intact Psych: euthymic mood, full affect    Recent Labs: 03/13/2020: B Natriuretic Peptide 477.4 03/17/2020: Magnesium 1.7 05/18/2020: ALT 13; BUN 20; Creatinine, Ser 0.85; Hemoglobin 11.3; Platelets 274; Potassium 3.7; Sodium 142; TSH 1.740    Lipid Panel Lab Results  Component Value Date   CHOL 98 03/13/2020   HDL 33 (L) 03/13/2020   LDLCALC 48 03/13/2020   TRIG 84 03/13/2020      Wt Readings from Last 3 Encounters:  05/18/20 129 lb (58.5 kg)  03/12/20 120 lb (54.4 kg)  04/15/18 141 lb 8 oz (64.2 kg)       ASSESSMENT AND PLAN:  Problem List Items Addressed This Visit   None      Disposition:   F/U  12 months   Total encounter time more than 25 minutes  Greater than 50% was spent in counseling and coordination of care with the patient    Signed, Dossie Arbour, M.D., Ph.D. [facility] [place], [place] [phone]

## 2020-09-14 ENCOUNTER — Ambulatory Visit: Payer: Medicare Other | Admitting: Cardiovascular Disease

## 2020-09-14 DIAGNOSIS — I10 Essential (primary) hypertension: Secondary | ICD-10-CM

## 2020-09-14 DIAGNOSIS — I2510 Atherosclerotic heart disease of native coronary artery without angina pectoris: Secondary | ICD-10-CM

## 2020-09-14 DIAGNOSIS — I480 Paroxysmal atrial fibrillation: Secondary | ICD-10-CM

## 2020-09-14 DIAGNOSIS — Z7901 Long term (current) use of anticoagulants: Secondary | ICD-10-CM

## 2020-09-14 DIAGNOSIS — E782 Mixed hyperlipidemia: Secondary | ICD-10-CM

## 2020-10-20 NOTE — Progress Notes (Deleted)
Cardiology Office Note  Date:  10/20/2020   ID:  Michelle Bauer, DOB July 05, 1940, MRN [id number]  PCP:  [name], MD   No chief complaint on file.   HPI:  Michelle Bauer is a 80 y.o. female with a hx of  hypertension,  hyperlipidemia,  severe scoliosis,  possible dementia presenting to the hospital 03/12/20 after mechanical fall, hip fracture, post surgery developing atrial fibrillation with RVR  Seen in clinic by one of our providers May 18, 2020  Discharged on metoprolol, amiodarone, Eliquis.   She was seen for hospital follow-up 03/29/2020 and maintaining sinus rhythm. She was working with PT/OT at [facility]. She noted no recurrent palpitations or symptoms suggestive of atrial fibrillation.        PMH:   has a past medical history of Anxiety, Depression, Essential hypertension, and Hyperlipidemia.  PSH:    Past Surgical History:  Procedure Laterality Date  . ABDOMINAL HYSTERECTOMY    . BYPASS AXILLA/BRACHIAL ARTERY    . CHOLECYSTECTOMY    . COLONOSCOPY WITH PROPOFOL N/A 03/07/2018   Procedure: COLONOSCOPY WITH PROPOFOL;  Surgeon: Midge Minium, MD;  Location: [facility] ENDOSCOPY;  Service: Endoscopy;  Laterality: N/A;  . INTRAMEDULLARY (IM) NAIL INTERTROCHANTERIC Right 03/10/2020   Procedure: INTRAMEDULLARY (IM) NAIL INTERTROCHANTRIC;  Surgeon: Juanell Fairly, MD;  Location: [facility] ORS;  Service: Orthopedics;  Laterality: Right;    Current Outpatient Medications  Medication Sig Dispense Refill  . acetaminophen (TYLENOL) 500 MG tablet TAKE 2 TABS (1000MG ) BY MOUTH THREE TIMES DAILY    . amiodarone (PACERONE) 200 MG tablet Take 1 tablet (200 mg total) by mouth daily. 90 tablet 1  . apixaban (ELIQUIS) 5 MG TABS tablet Take 1 tablet (5 mg total) by mouth 2 (two) times daily. 180 tablet 1  . clonazePAM (KLONOPIN) 0.5 MG tablet Take 1 tablet (0.5 mg total) by mouth 4 (four) times daily. TAKE 1 TABLET(0.5 MG) BY MOUTH FOUR TIMES DAILY 360 tablet 1  . escitalopram  (LEXAPRO) 5 MG tablet TAKE 1 TABLET(5 MG) BY MOUTH DAILY 90 tablet 1  . ferrous sulfate 325 (65 FE) MG tablet Take 1 tablet (325 mg total) by mouth daily with breakfast.    . metoprolol tartrate (LOPRESSOR) 25 MG tablet Take 0.5 tablets (12.5 mg total) by mouth 2 (two) times daily. 90 tablet 1  . Multiple Vitamins-Minerals (ONE-A-DAY WOMENS 50 PLUS) TABS Take 1 tablet by mouth daily.    . Potassium Gluconate 550 MG TABS Take 1,100 mg by mouth daily.    . traMADol (ULTRAM) 50 MG tablet Take 2 tablets (100 mg total) by mouth in the morning, at noon, and at bedtime. 180 tablet 5  . vitamin B-12 1000 MCG tablet Take 1 tablet (1,000 mcg total) by mouth daily.    VITAMIN D PO Take 2,000 Int'l Units by mouth daily.     No current facility-administered medications for this visit.     Allergies:   Other, Penicillins, and Sulfa antibiotics   Social History:  The patient  reports that she has never smoked. She has never used smokeless tobacco. She reports that she does not drink alcohol and does not use drugs.   Family History:   family history includes Alcohol abuse in her father; Colon cancer in her sister; Diabetes in her brother; Heart attack in her father; Stroke in her mother and sister.    Review of Systems: ROS   PHYSICAL EXAM: VS:  There were no vitals taken for this visit. , BMI  There is no height or weight on file to calculate BMI. GEN: Well nourished, well developed, in no acute distress HEENT: normal Neck: no JVD, carotid bruits, or masses Cardiac: RRR; no murmurs, rubs, or gallops,no edema  Respiratory:  clear to auscultation bilaterally, normal work of breathing GI: soft, nontender, nondistended, + BS MS: no deformity or atrophy Skin: warm and dry, no rash Neuro:  Strength and sensation are intact Psych: euthymic mood, full affect    Recent Labs: 03/13/2020: B Natriuretic Peptide 477.4 03/17/2020: Magnesium 1.7 05/18/2020: ALT 13; BUN 20; Creatinine, Ser 0.85; Hemoglobin  11.3; Platelets 274; Potassium 3.7; Sodium 142; TSH 1.740    Lipid Panel Lab Results  Component Value Date   CHOL 98 03/13/2020   HDL 33 (L) 03/13/2020   LDLCALC 48 03/13/2020   TRIG 84 03/13/2020      Wt Readings from Last 3 Encounters:  05/18/20 129 lb (58.5 kg)  03/12/20 120 lb (54.4 kg)  04/15/18 141 lb 8 oz (64.2 kg)       ASSESSMENT AND PLAN:  Problem List Items Addressed This Visit   None      Disposition:   F/U  12 months   Total encounter time more than 25 minutes  Greater than 50% was spent in counseling and coordination of care with the patient    Signed, Dossie Arbour, M.D., Ph.D. [facility] [place], [place] [phone]

## 2020-10-22 ENCOUNTER — Ambulatory Visit: Payer: Medicare Other | Admitting: Cardiovascular Disease

## 2020-10-22 DIAGNOSIS — I10 Essential (primary) hypertension: Secondary | ICD-10-CM

## 2020-10-22 DIAGNOSIS — I480 Paroxysmal atrial fibrillation: Secondary | ICD-10-CM

## 2020-10-22 DIAGNOSIS — E782 Mixed hyperlipidemia: Secondary | ICD-10-CM

## 2020-10-22 DIAGNOSIS — I2510 Atherosclerotic heart disease of native coronary artery without angina pectoris: Secondary | ICD-10-CM

## 2020-11-18 ENCOUNTER — Other Ambulatory Visit: Payer: Self-pay

## 2020-11-18 ENCOUNTER — Ambulatory Visit (INDEPENDENT_AMBULATORY_CARE_PROVIDER_SITE_OTHER): Payer: Medicare Other | Admitting: Nurse Practitioner

## 2020-11-18 ENCOUNTER — Encounter: Payer: Self-pay | Admitting: Nurse Practitioner

## 2020-11-18 VITALS — BP 168/78 | HR 64 | Ht 63.0 in | Wt 137.4 lb

## 2020-11-18 DIAGNOSIS — I10 Essential (primary) hypertension: Secondary | ICD-10-CM

## 2020-11-18 DIAGNOSIS — I2510 Atherosclerotic heart disease of native coronary artery without angina pectoris: Secondary | ICD-10-CM | POA: Diagnosis not present

## 2020-11-18 DIAGNOSIS — I480 Paroxysmal atrial fibrillation: Secondary | ICD-10-CM | POA: Diagnosis not present

## 2020-11-18 DIAGNOSIS — F411 Generalized anxiety disorder: Secondary | ICD-10-CM

## 2020-11-18 NOTE — Progress Notes (Signed)
Office Visit    Patient Name: Michelle Bauer Date of Encounter: 11/18/2020  Primary Care Provider:  [name], MD Primary Cardiologist:  Julien Nordmann, MD  Chief Complaint    80 year old female with a history of hypertension, hyperlipidemia, postoperative atrial fibrillation, and scoliosis, who presents for follow-up of A. fib.  Past Medical History    Past Medical History:  Diagnosis Date  . Anxiety   . Depression   . Essential hypertension   . Hyperlipidemia   . PAF (paroxysmal atrial fibrillation) (HCC)    a. 02/2020 Post-op Afib after R Femur FX and IM nail (also hypokalemic)-->Amio/Eliquis (CHA2DS2VASc = 4).   Past Surgical History:  Procedure Laterality Date  . ABDOMINAL HYSTERECTOMY    . BYPASS AXILLA/BRACHIAL ARTERY    . CHOLECYSTECTOMY    . COLONOSCOPY WITH PROPOFOL N/A 03/07/2018   Procedure: COLONOSCOPY WITH PROPOFOL;  Surgeon: Midge Minium, MD;  Location: [facility] ENDOSCOPY;  Service: Endoscopy;  Laterality: N/A;  . INTRAMEDULLARY (IM) NAIL INTERTROCHANTERIC Right 03/10/2020   Procedure: INTRAMEDULLARY (IM) NAIL INTERTROCHANTRIC;  Surgeon: Juanell Fairly, MD;  Location: [facility] ORS;  Service: Orthopedics;  Laterality: Right;    Allergies  Allergies  Allergen Reactions  . Other Other (See Comments)  . Penicillins Other (See Comments)    Has patient had a PCN reaction causing immediate rash, facial/tongue/throat swelling, SOB or lightheadedness with hypotension: Unknown Has patient had a PCN reaction causing severe rash involving mucus membranes or skin necrosis: Unknown Has patient had a PCN reaction that required hospitalization: Unknown Has patient had a PCN reaction occurring within the last 10 years: No If all of the above answers are "NO", then may proceed with Cephalosporin use.  . Sulfa Antibiotics Other (See Comments)    History of Present Illness    79 year old female with above past medical history including hypertension, hyperlipidemia,  and scoliosis.  In September 2021, she suffered a right proximal femur fracture requiring intramedullary fixation and developed A. fib with RVR in the setting of hypokalemia and anemia.  She was placed on amiodarone therapy and subsequent conversion to sinus rhythm.  Echocardiogram during admission showed normal LV function at 65 to 70% with grade 1 diastolic dysfunction, mild left atrial dilation, and mild MR, AI, and aortic sclerosis.  Eliquis 5 mg twice daily was initiated prior to discharge.  Patient was last seen in cardiology clinic in November 2021, at which time she was maintaining sinus rhythm and feeling well.  Lab work following that admission showed normal LFTs, electrolytes, renal function, TSH, and hemoglobin.  She is done well since her last visit.  She stays in assisted living.  She does use a walker to ambulate, as this is required by staff, but is able to do so fairly quickly, stably, and without chest pain or dyspnea.  She denies palpitations, PND, orthopnea, dizziness, syncope, edema, or early satiety.  She notes her blood pressure is up today because she gets very anxious when coming to provider appointments.  She would appreciate a virtual visit in the future if possible.  Home Medications    Prior to Admission medications   Medication Sig Start Date End Date Taking? Authorizing Provider  acetaminophen (TYLENOL) 500 MG tablet TAKE 2 TABS (1000MG ) BY MOUTH THREE TIMES DAILY 09/10/20   [provider]  amiodarone (PACERONE) 200 MG tablet Take 1 tablet (200 mg total) by mouth daily. 03/29/20   05/29/20, NP  apixaban (ELIQUIS) 5 MG TABS tablet Take 1 tablet (5  mg total) by mouth 2 (two) times daily. 07/15/20   Alver Sorrow, NP  clonazePAM (KLONOPIN) 0.5 MG tablet Take 1 tablet (0.5 mg total) by mouth 4 (four) times daily. TAKE 1 TABLET(0.5 MG) BY MOUTH FOUR TIMES DAILY 08/26/20   Clapacs, Jackquline Denmark, MD  escitalopram (LEXAPRO) 5 MG tablet TAKE 1 TABLET(5 MG) BY MOUTH  DAILY 08/26/20   Clapacs, Jackquline Denmark, MD  ferrous sulfate 325 (65 FE) MG tablet Take 1 tablet (325 mg total) by mouth daily with breakfast. 03/17/20   Lurene Shadow, MD  metoprolol tartrate (LOPRESSOR) 25 MG tablet Take 0.5 tablets (12.5 mg total) by mouth 2 (two) times daily. 03/29/20   Alver Sorrow, NP  Multiple Vitamins-Minerals (ONE-A-DAY WOMENS 50 PLUS) TABS Take 1 tablet by mouth daily.    [provider]  Potassium Gluconate 550 MG TABS Take 1,100 mg by mouth daily.    [provider]  traMADol (ULTRAM) 50 MG tablet Take 2 tablets (100 mg total) by mouth in the morning, at noon, and at bedtime. 08/26/20   Clapacs, Jackquline Denmark, MD  vitamin B-12 1000 MCG tablet Take 1 tablet (1,000 mcg total) by mouth daily. 03/18/20   Lurene Shadow, MD  VITAMIN D PO Take 2,000 Int'l Units by mouth daily.    [provider]    Review of Systems    Doing well. She denies chest pain, palpitations, dyspnea, pnd, orthopnea, n, v, dizziness, syncope, edema, weight gain, or early satiety.  All other systems reviewed and are otherwise negative except as noted above.  Physical Exam    VS:  BP (!) 168/78 (BP Location: Left Arm, Patient Position: Sitting, Cuff Size: Normal)   Pulse 64   Ht 5\' 3"  (1.6 m)   Wt 137 lb 6 oz (62.3 kg)   SpO2 96%   BMI 24.33 kg/m  , BMI Body mass index is 24.33 kg/m. GEN: Well nourished, well developed, in no acute distress. HEENT: normal. Neck: Supple, no JVD, carotid bruits, or masses. Cardiac: RRR, 2/6 systolic ejection murmur at the upper sternal borders, no rubs, or gallops. No clubbing, cyanosis, edema.  Radials//PT 2+ and equal bilaterally.  Respiratory:  Respirations regular and unlabored, clear to auscultation bilaterally. GI: Soft, nontender, nondistended, BS + x 4. MS: no deformity or atrophy. Skin: warm and dry, no rash. Neuro:  Strength and sensation are intact. Psych: Normal affect.  Accessory Clinical Findings    ECG personally reviewed  by me today -regular sinus rhythm, 64 - no acute changes.  Lab Results  Component Value Date   WBC 5.4 05/18/2020   HGB 11.3 05/18/2020   HCT 33.8 (L) 05/18/2020   MCV 88 05/18/2020   PLT 274 05/18/2020   Lab Results  Component Value Date   CREATININE 0.85 05/18/2020   BUN 20 05/18/2020   NA 142 05/18/2020   K 3.7 05/18/2020   CL 101 05/18/2020   CO2 29 05/18/2020   Lab Results  Component Value Date   ALT 13 05/18/2020   AST 16 05/18/2020   ALKPHOS 112 05/18/2020   BILITOT 0.2 05/18/2020   Lab Results  Component Value Date   CHOL 98 03/13/2020   HDL 33 (L) 03/13/2020   LDLCALC 48 03/13/2020   TRIG 84 03/13/2020   CHOLHDL 3.0 03/13/2020    Lab Results  Component Value Date   HGBA1C 5.3 03/11/2020    Assessment & Plan    1.  Paroxysmal atrial fibrillation: Patient with a history of postoperative  A. fib in the setting of hypokalemia after hip surgery in September 2021.  Rates remained elevated despite IV diltiazem, and she was switched to amiodarone with subsequent conversion.  She continues to maintain sinus rhythm with stable QT on ECG today.  She is anticoagulated with Eliquis and is tolerating well.  I will follow-up a CBC, complete metabolic panel, and TSH today.  2.  Essential hypertension: Blood pressure elevated today though patient notes that she is very anxious, as is typical for her when going to a provider appointment.  She does check her blood pressure at home and both she and her daughter indicate that it is normal when she is at home.  In that setting, I will continue current dose of metoprolol without any additions.  They will continue to follow blood pressure at home.  3.  Anxiety: Patient notes significant anxiety when coming to provider appointments.  After our discussion, she is more calm.  She would appreciate a virtual visit whenever possible in the future.  She understands that she will require labs every 6 months.  She remains on Lexapro and Klonopin  at home.  We will see if we can arrange for a virtual visit at next follow-up.  4.  Disposition: Follow-up in 6 months or sooner if necessary.  Patient would appreciate virtual visit.   Nicolasa Ducking, NP 11/18/2020, 10:47 AM

## 2020-11-18 NOTE — Patient Instructions (Signed)
Medication Instructions:  No changes at this time.  *If you need a refill on your cardiac medications before your next appointment, please call your pharmacy*   Lab Work: CMET, CBC, TSH today  If you have labs (blood work) drawn today and your tests are completely normal, you will receive your results only by: Marland Kitchen MyChart Message (if you have MyChart) OR . A paper copy in the mail If you have any lab test that is abnormal or we need to change your treatment, we will call you to review the results.   Testing/Procedures: None   Follow-Up: At [facility], you and your health needs are our priority.  As part of our continuing mission to provide you with exceptional heart care, we have created designated Provider Care Teams.  These Care Teams include your primary Cardiologist (physician) and Advanced Practice Providers (APPs -  Physician Assistants and Nurse Practitioners) who all work together to provide you with the care you need, when you need it.   Your next appointment:   6 month(s)  The format for your next appointment:   Virtual Visit   Provider:   Julien Nordmann, MD or Nicolasa Ducking, NP

## 2020-11-19 LAB — COMPREHENSIVE METABOLIC PANEL
ALT: 11 IU/L (ref 0–32)
AST: 16 IU/L (ref 0–40)
Albumin/Globulin Ratio: 1 — ABNORMAL LOW (ref 1.2–2.2)
Albumin: 3.8 g/dL (ref 3.7–4.7)
Alkaline Phosphatase: 129 IU/L — ABNORMAL HIGH (ref 44–121)
BUN/Creatinine Ratio: 28 (ref 12–28)
BUN: 23 mg/dL (ref 8–27)
Bilirubin Total: 0.3 mg/dL (ref 0.0–1.2)
CO2: 24 mmol/L (ref 20–29)
Calcium: 9.2 mg/dL (ref 8.7–10.3)
Chloride: 101 mmol/L (ref 96–106)
Creatinine, Ser: 0.81 mg/dL (ref 0.57–1.00)
Globulin, Total: 3.7 g/dL (ref 1.5–4.5)
Glucose: 122 mg/dL — ABNORMAL HIGH (ref 65–99)
Potassium: 4.4 mmol/L (ref 3.5–5.2)
Sodium: 141 mmol/L (ref 134–144)
Total Protein: 7.5 g/dL (ref 6.0–8.5)
eGFR: 74 mL/min/{1.73_m2} (ref >59)

## 2020-11-19 LAB — CBC
Hematocrit: 36.3 % (ref 34.0–46.6)
Hemoglobin: 11.6 g/dL (ref 11.1–15.9)
MCH: 28.6 pg (ref 26.6–33.0)
MCHC: 32 g/dL (ref 31.5–35.7)
MCV: 89 fL (ref 79–97)
Platelets: 279 10*3/uL (ref 150–450)
RBC: 4.06 x10E6/uL (ref 3.77–5.28)
RDW: 13 % (ref 11.7–15.4)
WBC: 7.5 10*3/uL (ref 3.4–10.8)

## 2020-11-19 LAB — TSH: TSH: 2.11 u[IU]/mL (ref 0.450–4.500)

## 2020-11-19 NOTE — Addendum Note (Signed)
Addended by: Kendrick Fries on: 11/19/2020 12:43 PM   Modules accepted: Orders

## 2021-03-22 ENCOUNTER — Other Ambulatory Visit: Payer: Self-pay | Admitting: Psychiatry

## 2021-03-22 DIAGNOSIS — F411 Generalized anxiety disorder: Secondary | ICD-10-CM

## 2021-03-22 MED ORDER — ESCITALOPRAM OXALATE 5 MG PO TABS: ORAL_TABLET | ORAL | 1 refills | Status: DC

## 2021-03-22 MED ORDER — CLONAZEPAM 0.5 MG PO TABS: 0.5000 mg | ORAL_TABLET | Freq: Four times a day (QID) | ORAL | 1 refills | Status: DC

## 2021-03-24 ENCOUNTER — Telehealth (INDEPENDENT_AMBULATORY_CARE_PROVIDER_SITE_OTHER): Payer: Medicare Other | Admitting: Psychiatry

## 2021-03-24 ENCOUNTER — Other Ambulatory Visit: Payer: Self-pay

## 2021-03-24 DIAGNOSIS — F411 Generalized anxiety disorder: Secondary | ICD-10-CM | POA: Diagnosis not present

## 2021-03-24 DIAGNOSIS — F332 Major depressive disorder, recurrent severe without psychotic features: Secondary | ICD-10-CM

## 2021-03-24 NOTE — Progress Notes (Signed)
Virtual Visit via Telephone Note  I connected with Michelle Bauer on 03/24/21 at  1:20 PM EDT by telephone and verified that I am speaking with the correct person using two identifiers.  Location: Patient: Home Provider: Hospital   I discussed the limitations, risks, security and privacy concerns of performing an evaluation and management service by telephone and the availability of in person appointments. I also discussed with the patient that there may be a patient responsible charge related to this service. The patient expressed understanding and agreed to proceed.   History of Present Illness: Patient reached by telephone.  Identities established.  Patient's daughter Carollee Herter was with the patient and also participated in the appointment.  Today the patient tells me that she is feeling "paranoid" and nervous.  Carollee Herter clarifies that that is really just today and yesterday because her clonazepam had run out and she missed her medicine for a day.  Prior to that she had been doing fine with a stable mood no paranoia anxiety under good control.  No other new complaints.  Patient sounds a little jittery but not as bad as she has been at other times in the past when she stopped her medicine for longer.  Patient is still enjoying contact with her family.  Good quality of life.    Observations/Objective: Patient does sound nervous and a little withdrawn and tells me that she is feeling paranoid.  No suicidal thought no homicidal thought and no evidence of dangerousness.   Assessment and Plan: We know that the patient decompensates from even missing a day of her clonazepam.  I reassured the patient and Carollee Herter that we will do our best to make sure that it does not run out in the future.  I have put in enough for 6 months at this point.  Follow-up prior to running out of the medicine again.  Supportive counseling and encouragement to both of them.  No new complaints.   Follow Up Instructions:    I  discussed the assessment and treatment plan with the patient. The patient was provided an opportunity to ask questions and all were answered. The patient agreed with the plan and demonstrated an understanding of the instructions.   The patient was advised to call back or seek an in-person evaluation if the symptoms worsen or if the condition fails to improve as anticipated.  I provided 20 minutes of non-face-to-face time during this encounter.   Mordecai Rasmussen, MD

## 2021-03-31 ENCOUNTER — Other Ambulatory Visit: Payer: Self-pay | Admitting: Psychiatry

## 2021-03-31 MED ORDER — TRAMADOL HCL 50 MG PO TABS: 100.0000 mg | ORAL_TABLET | Freq: Three times a day (TID) | ORAL | 5 refills | Status: DC

## 2021-07-06 ENCOUNTER — Emergency Department: Payer: Medicare Other

## 2021-07-06 ENCOUNTER — Emergency Department
Admission: EM | Admit: 2021-07-06 | Discharge: 2021-07-06 | Disposition: A | Discharge status: Other Acute Inpatient Hospital | Payer: Medicare Other | Attending: Emergency Medicine | Admitting: Emergency Medicine

## 2021-07-06 ENCOUNTER — Inpatient Hospital Stay (HOSPITAL_COMMUNITY): Payer: Medicare Other

## 2021-07-06 ENCOUNTER — Inpatient Hospital Stay (HOSPITAL_COMMUNITY)
Admission: EM | Admit: 2021-07-06 | Discharge: 2021-07-15 | DRG: 064 | Disposition: A | Discharge status: Rehabilitation Facility | Payer: Medicare Other | Source: Other Acute Inpatient Hospital | Attending: Internal Medicine | Admitting: Internal Medicine

## 2021-07-06 DIAGNOSIS — J9811 Atelectasis: Secondary | ICD-10-CM | POA: Diagnosis present

## 2021-07-06 DIAGNOSIS — Z833 Family history of diabetes mellitus: Secondary | ICD-10-CM

## 2021-07-06 DIAGNOSIS — R140 Abdominal distension (gaseous): Secondary | ICD-10-CM | POA: Diagnosis present

## 2021-07-06 DIAGNOSIS — I4891 Unspecified atrial fibrillation: Secondary | ICD-10-CM | POA: Diagnosis not present

## 2021-07-06 DIAGNOSIS — R0902 Hypoxemia: Secondary | ICD-10-CM | POA: Diagnosis present

## 2021-07-06 DIAGNOSIS — Z79899 Other long term (current) drug therapy: Secondary | ICD-10-CM

## 2021-07-06 DIAGNOSIS — I629 Nontraumatic intracranial hemorrhage, unspecified: Secondary | ICD-10-CM | POA: Diagnosis not present

## 2021-07-06 DIAGNOSIS — Z800 Family history of malignant neoplasm of digestive organs: Secondary | ICD-10-CM | POA: Diagnosis not present

## 2021-07-06 DIAGNOSIS — W19XXXA Unspecified fall, initial encounter: Secondary | ICD-10-CM | POA: Diagnosis not present

## 2021-07-06 DIAGNOSIS — Z8249 Family history of ischemic heart disease and other diseases of the circulatory system: Secondary | ICD-10-CM

## 2021-07-06 DIAGNOSIS — I610 Nontraumatic intracerebral hemorrhage in hemisphere, subcortical: Principal | ICD-10-CM | POA: Diagnosis present

## 2021-07-06 DIAGNOSIS — D649 Anemia, unspecified: Secondary | ICD-10-CM | POA: Diagnosis present

## 2021-07-06 DIAGNOSIS — I619 Nontraumatic intracerebral hemorrhage, unspecified: Secondary | ICD-10-CM | POA: Diagnosis not present

## 2021-07-06 DIAGNOSIS — R296 Repeated falls: Secondary | ICD-10-CM | POA: Diagnosis not present

## 2021-07-06 DIAGNOSIS — M419 Scoliosis, unspecified: Secondary | ICD-10-CM | POA: Diagnosis present

## 2021-07-06 DIAGNOSIS — I6389 Other cerebral infarction: Secondary | ICD-10-CM | POA: Diagnosis not present

## 2021-07-06 DIAGNOSIS — R29711 NIHSS score 11: Secondary | ICD-10-CM | POA: Diagnosis present

## 2021-07-06 DIAGNOSIS — S43002S Unspecified subluxation of left shoulder joint, sequela: Secondary | ICD-10-CM | POA: Diagnosis not present

## 2021-07-06 DIAGNOSIS — Z7901 Long term (current) use of anticoagulants: Secondary | ICD-10-CM | POA: Insufficient documentation

## 2021-07-06 DIAGNOSIS — I639 Cerebral infarction, unspecified: Secondary | ICD-10-CM | POA: Diagnosis present

## 2021-07-06 DIAGNOSIS — Z882 Allergy status to sulfonamides status: Secondary | ICD-10-CM | POA: Diagnosis not present

## 2021-07-06 DIAGNOSIS — N3000 Acute cystitis without hematuria: Secondary | ICD-10-CM | POA: Diagnosis not present

## 2021-07-06 DIAGNOSIS — Z20822 Contact with and (suspected) exposure to covid-19: Secondary | ICD-10-CM | POA: Diagnosis present

## 2021-07-06 DIAGNOSIS — Z811 Family history of alcohol abuse and dependence: Secondary | ICD-10-CM | POA: Diagnosis not present

## 2021-07-06 DIAGNOSIS — B9620 Unspecified Escherichia coli [E. coli] as the cause of diseases classified elsewhere: Secondary | ICD-10-CM | POA: Diagnosis present

## 2021-07-06 DIAGNOSIS — S0081XA Abrasion of other part of head, initial encounter: Secondary | ICD-10-CM | POA: Insufficient documentation

## 2021-07-06 DIAGNOSIS — N390 Urinary tract infection, site not specified: Secondary | ICD-10-CM | POA: Diagnosis present

## 2021-07-06 DIAGNOSIS — G8190 Hemiplegia, unspecified affecting unspecified side: Secondary | ICD-10-CM | POA: Diagnosis present

## 2021-07-06 DIAGNOSIS — I2510 Atherosclerotic heart disease of native coronary artery without angina pectoris: Secondary | ICD-10-CM | POA: Insufficient documentation

## 2021-07-06 DIAGNOSIS — F419 Anxiety disorder, unspecified: Secondary | ICD-10-CM | POA: Diagnosis present

## 2021-07-06 DIAGNOSIS — Z823 Family history of stroke: Secondary | ICD-10-CM

## 2021-07-06 DIAGNOSIS — F32A Depression, unspecified: Secondary | ICD-10-CM | POA: Diagnosis present

## 2021-07-06 DIAGNOSIS — I480 Paroxysmal atrial fibrillation: Secondary | ICD-10-CM | POA: Diagnosis present

## 2021-07-06 DIAGNOSIS — Z880 Allergy status to penicillin: Secondary | ICD-10-CM

## 2021-07-06 DIAGNOSIS — G8194 Hemiplegia, unspecified affecting left nondominant side: Secondary | ICD-10-CM | POA: Diagnosis present

## 2021-07-06 DIAGNOSIS — Z66 Do not resuscitate: Secondary | ICD-10-CM | POA: Diagnosis present

## 2021-07-06 DIAGNOSIS — E785 Hyperlipidemia, unspecified: Secondary | ICD-10-CM | POA: Diagnosis present

## 2021-07-06 DIAGNOSIS — E46 Unspecified protein-calorie malnutrition: Secondary | ICD-10-CM | POA: Diagnosis present

## 2021-07-06 DIAGNOSIS — R29810 Facial weakness: Secondary | ICD-10-CM | POA: Diagnosis present

## 2021-07-06 DIAGNOSIS — D62 Acute posthemorrhagic anemia: Secondary | ICD-10-CM | POA: Diagnosis present

## 2021-07-06 DIAGNOSIS — I10 Essential (primary) hypertension: Secondary | ICD-10-CM | POA: Diagnosis present

## 2021-07-06 DIAGNOSIS — Z6828 Body mass index (BMI) 28.0-28.9, adult: Secondary | ICD-10-CM | POA: Diagnosis not present

## 2021-07-06 DIAGNOSIS — J9601 Acute respiratory failure with hypoxia: Secondary | ICD-10-CM | POA: Diagnosis not present

## 2021-07-06 DIAGNOSIS — I69354 Hemiplegia and hemiparesis following cerebral infarction affecting left non-dominant side: Secondary | ICD-10-CM | POA: Diagnosis present

## 2021-07-06 DIAGNOSIS — E663 Overweight: Secondary | ICD-10-CM | POA: Diagnosis present

## 2021-07-06 DIAGNOSIS — G4700 Insomnia, unspecified: Secondary | ICD-10-CM | POA: Diagnosis present

## 2021-07-06 DIAGNOSIS — F411 Generalized anxiety disorder: Secondary | ICD-10-CM | POA: Diagnosis present

## 2021-07-06 DIAGNOSIS — I4819 Other persistent atrial fibrillation: Secondary | ICD-10-CM | POA: Diagnosis not present

## 2021-07-06 DIAGNOSIS — R197 Diarrhea, unspecified: Secondary | ICD-10-CM | POA: Diagnosis present

## 2021-07-06 DIAGNOSIS — R109 Unspecified abdominal pain: Secondary | ICD-10-CM

## 2021-07-06 DIAGNOSIS — R4781 Slurred speech: Secondary | ICD-10-CM | POA: Diagnosis present

## 2021-07-06 LAB — URINE DRUG SCREEN, QUALITATIVE (ARMC ONLY)
Amphetamines, Ur Screen: NOT DETECTED
Barbiturates, Ur Screen: NOT DETECTED
Benzodiazepine, Ur Scrn: NOT DETECTED
Cannabinoid 50 Ng, Ur ~~LOC~~: NOT DETECTED
Cocaine Metabolite,Ur ~~LOC~~: NOT DETECTED
MDMA (Ecstasy)Ur Screen: NOT DETECTED
Methadone Scn, Ur: NOT DETECTED
Opiate, Ur Screen: NOT DETECTED
Phencyclidine (PCP) Ur S: NOT DETECTED
Tricyclic, Ur Screen: NOT DETECTED

## 2021-07-06 LAB — URINALYSIS, ROUTINE W REFLEX MICROSCOPIC
Bilirubin Urine: NEGATIVE
Glucose, UA: NEGATIVE mg/dL
Ketones, ur: NEGATIVE mg/dL
Nitrite: NEGATIVE
Protein, ur: 30 mg/dL — AB
Specific Gravity, Urine: 1.015 (ref 1.005–1.030)
pH: 7 (ref 5.0–8.0)

## 2021-07-06 LAB — ECHOCARDIOGRAM COMPLETE
AR max vel: 2.08 cm2
AV Area VTI: 1.99 cm2
AV Area mean vel: 2 cm2
AV Mean grad: 5 mmHg
AV Peak grad: 8.8 mmHg
Ao pk vel: 1.48 m/s
Area-P 1/2: 1.95 cm2
Calc EF: 59.3 %
Height: 63 in
MV VTI: 2.51 cm2
S' Lateral: 1.7 cm
Single Plane A2C EF: 63.4 %
Single Plane A4C EF: 53.8 %
Weight: 2370.39 oz

## 2021-07-06 LAB — COMPREHENSIVE METABOLIC PANEL
ALT: 12 U/L (ref 0–44)
AST: 21 U/L (ref 15–41)
Albumin: 3.2 g/dL — ABNORMAL LOW (ref 3.5–5.0)
Alkaline Phosphatase: 103 U/L (ref 38–126)
Anion gap: 7 (ref 5–15)
BUN: 23 mg/dL (ref 8–23)
CO2: 27 mmol/L (ref 22–32)
Calcium: 8.8 mg/dL — ABNORMAL LOW (ref 8.9–10.3)
Chloride: 103 mmol/L (ref 98–111)
Creatinine, Ser: 0.7 mg/dL (ref 0.44–1.00)
GFR, Estimated: >60 mL/min (ref >60)
Glucose, Bld: 104 mg/dL — ABNORMAL HIGH (ref 70–99)
Potassium: 3.9 mmol/L (ref 3.5–5.1)
Sodium: 137 mmol/L (ref 135–145)
Total Bilirubin: 0.6 mg/dL (ref 0.3–1.2)
Total Protein: 7.2 g/dL (ref 6.5–8.1)

## 2021-07-06 LAB — PROTIME-INR
INR: 1.4 — ABNORMAL HIGH (ref 0.8–1.2)
Prothrombin Time: 16.9 seconds — ABNORMAL HIGH (ref 11.4–15.2)

## 2021-07-06 LAB — DIFFERENTIAL
Abs Immature Granulocytes: 0.09 10*3/uL — ABNORMAL HIGH (ref 0.00–0.07)
Basophils Absolute: 0.1 10*3/uL (ref 0.0–0.1)
Basophils Relative: 1 %
Eosinophils Absolute: 0.1 10*3/uL (ref 0.0–0.5)
Eosinophils Relative: 1 %
Immature Granulocytes: 1 %
Lymphocytes Relative: 16 %
Lymphs Abs: 1.9 10*3/uL (ref 0.7–4.0)
Monocytes Absolute: 0.7 10*3/uL (ref 0.1–1.0)
Monocytes Relative: 6 %
Neutro Abs: 9.1 10*3/uL — ABNORMAL HIGH (ref 1.7–7.7)
Neutrophils Relative %: 75 %

## 2021-07-06 LAB — CBC
HCT: 39.5 % (ref 36.0–46.0)
Hemoglobin: 12.5 g/dL (ref 12.0–15.0)
MCH: 29.8 pg (ref 26.0–34.0)
MCHC: 31.6 g/dL (ref 30.0–36.0)
MCV: 94.3 fL (ref 80.0–100.0)
Platelets: 221 10*3/uL (ref 150–400)
RBC: 4.19 MIL/uL (ref 3.87–5.11)
RDW: 14 % (ref 11.5–15.5)
WBC: 12 10*3/uL — ABNORMAL HIGH (ref 4.0–10.5)
nRBC: 0 % (ref 0.0–0.2)

## 2021-07-06 LAB — CBG MONITORING, ED: Glucose-Capillary: 102 mg/dL — ABNORMAL HIGH (ref 70–99)

## 2021-07-06 LAB — APTT: aPTT: 35 seconds (ref 24–36)

## 2021-07-06 LAB — RESP PANEL BY RT-PCR (FLU A&B, COVID) ARPGX2
Influenza A by PCR: NEGATIVE
Influenza B by PCR: NEGATIVE
SARS Coronavirus 2 by RT PCR: NEGATIVE

## 2021-07-06 LAB — MRSA NEXT GEN BY PCR, NASAL: MRSA by PCR Next Gen: NOT DETECTED

## 2021-07-06 LAB — URINALYSIS, MICROSCOPIC (REFLEX)
Squamous Epithelial / HPF: NONE SEEN (ref 0–5)
WBC, UA: >50 WBC/hpf (ref 0–5)

## 2021-07-06 LAB — ETHANOL: Alcohol, Ethyl (B): <10 mg/dL (ref <10)

## 2021-07-06 MED ORDER — ORAL CARE MOUTH RINSE
15.0000 mL | Freq: Two times a day (BID) | OROMUCOSAL | Status: DC
  Administered 2021-07-06 – 2021-07-15 (×19): 15 mL via OROMUCOSAL

## 2021-07-06 MED ORDER — IOHEXOL 350 MG/ML SOLN
75.0000 mL | Freq: Once | INTRAVENOUS | Status: AC | PRN
  Administered 2021-07-06: 75 mL via INTRAVENOUS

## 2021-07-06 MED ORDER — BACITRACIN ZINC 500 UNIT/GM EX OINT: TOPICAL_OINTMENT | CUTANEOUS | Status: DC | PRN

## 2021-07-06 MED ORDER — LACTATED RINGERS IV BOLUS
1000.0000 mL | Freq: Once | INTRAVENOUS | Status: AC
  Administered 2021-07-06: 1000 mL via INTRAVENOUS

## 2021-07-06 MED ORDER — LABETALOL HCL 5 MG/ML IV SOLN
10.0000 mg | INTRAVENOUS | Status: DC | PRN
  Administered 2021-07-13 – 2021-07-15 (×2): 10 mg via INTRAVENOUS
  Filled 2021-07-06 (×3): qty 4

## 2021-07-06 MED ORDER — NITROFURANTOIN MONOHYD MACRO 100 MG PO CAPS
100.0000 mg | ORAL_CAPSULE | Freq: Two times a day (BID) | ORAL | Status: DC
  Administered 2021-07-06 – 2021-07-07 (×3): 100 mg via ORAL
  Filled 2021-07-06 (×3): qty 1

## 2021-07-06 MED ORDER — HYDRALAZINE HCL 20 MG/ML IJ SOLN
20.0000 mg | Freq: Four times a day (QID) | INTRAMUSCULAR | Status: DC | PRN
  Administered 2021-07-06 – 2021-07-14 (×5): 20 mg via INTRAVENOUS
  Filled 2021-07-06 (×5): qty 1

## 2021-07-06 MED ORDER — AMIODARONE HCL 200 MG PO TABS
200.0000 mg | ORAL_TABLET | Freq: Every day | ORAL | Status: DC
  Administered 2021-07-06 – 2021-07-15 (×10): 200 mg via ORAL
  Filled 2021-07-06 (×10): qty 1

## 2021-07-06 MED ORDER — ACETAMINOPHEN 650 MG RE SUPP: 650.0000 mg | RECTAL | Status: DC | PRN

## 2021-07-06 MED ORDER — BACITRACIN ZINC 500 UNIT/GM EX OINT
TOPICAL_OINTMENT | CUTANEOUS | Status: DC | PRN
  Filled 2021-07-06 (×2): qty 0.9

## 2021-07-06 MED ORDER — STROKE: EARLY STAGES OF RECOVERY BOOK
Freq: Once | Status: AC
  Filled 2021-07-06: qty 1

## 2021-07-06 MED ORDER — METOPROLOL TARTRATE 25 MG PO TABS
12.5000 mg | ORAL_TABLET | Freq: Two times a day (BID) | ORAL | Status: DC
  Administered 2021-07-06 – 2021-07-07 (×4): 12.5 mg via ORAL
  Filled 2021-07-06 (×5): qty 1

## 2021-07-06 MED ORDER — EMPTY CONTAINERS FLEXIBLE MISC
900.0000 mg | Freq: Once | Status: DC
  Administered 2021-07-06: 900 mg via INTRAVENOUS
  Filled 2021-07-06: qty 90

## 2021-07-06 MED ORDER — TRAMADOL HCL 50 MG PO TABS: 50.0000 mg | ORAL_TABLET | Freq: Two times a day (BID) | ORAL | Status: DC | PRN

## 2021-07-06 MED ORDER — CLEVIDIPINE BUTYRATE 0.5 MG/ML IV EMUL
0.0000 mg/h | INTRAVENOUS | Status: DC
  Administered 2021-07-06: 2 mg/h via INTRAVENOUS
  Filled 2021-07-06 (×2): qty 50

## 2021-07-06 MED ORDER — TRAMADOL HCL 50 MG PO TABS
50.0000 mg | ORAL_TABLET | Freq: Four times a day (QID) | ORAL | Status: DC | PRN
  Administered 2021-07-06 – 2021-07-11 (×6): 50 mg via ORAL
  Filled 2021-07-06 (×7): qty 1

## 2021-07-06 MED ORDER — CLEVIDIPINE BUTYRATE 0.5 MG/ML IV EMUL: 0.0000 mg/h | INTRAVENOUS | Status: DC

## 2021-07-06 MED ORDER — SENNOSIDES-DOCUSATE SODIUM 8.6-50 MG PO TABS
1.0000 | ORAL_TABLET | Freq: Two times a day (BID) | ORAL | Status: DC
  Administered 2021-07-06: 1 via ORAL
  Filled 2021-07-06: qty 1

## 2021-07-06 MED ORDER — CHLORHEXIDINE GLUCONATE CLOTH 2 % EX PADS
6.0000 | MEDICATED_PAD | Freq: Every day | CUTANEOUS | Status: DC
  Administered 2021-07-06 – 2021-07-09 (×7): 6 via TOPICAL

## 2021-07-06 MED ORDER — PANTOPRAZOLE SODIUM 40 MG IV SOLR
40.0000 mg | Freq: Every day | INTRAVENOUS | Status: DC
  Administered 2021-07-06: 40 mg via INTRAVENOUS
  Filled 2021-07-06: qty 40

## 2021-07-06 MED ORDER — CLEVIDIPINE BUTYRATE 0.5 MG/ML IV EMUL
0.0000 mg/h | INTRAVENOUS | Status: DC
  Administered 2021-07-06: 5 mg/h via INTRAVENOUS

## 2021-07-06 MED ORDER — ACETAMINOPHEN 160 MG/5ML PO SOLN: 650.0000 mg | ORAL | Status: DC | PRN

## 2021-07-06 MED ORDER — CLONAZEPAM 0.5 MG PO TABS
0.5000 mg | ORAL_TABLET | Freq: Four times a day (QID) | ORAL | Status: DC
  Administered 2021-07-06 (×3): 0.5 mg via ORAL
  Filled 2021-07-06 (×3): qty 1

## 2021-07-06 MED ORDER — PERFLUTREN LIPID MICROSPHERE
1.0000 mL | INTRAVENOUS | Status: AC | PRN
  Administered 2021-07-06: 2 mL via INTRAVENOUS
  Filled 2021-07-06: qty 10

## 2021-07-06 MED ORDER — ACETAMINOPHEN 325 MG PO TABS
650.0000 mg | ORAL_TABLET | ORAL | Status: DC | PRN
  Administered 2021-07-06 – 2021-07-12 (×4): 650 mg via ORAL
  Filled 2021-07-06 (×4): qty 2

## 2021-07-06 MED ORDER — MORPHINE SULFATE (PF) 2 MG/ML IV SOLN
1.0000 mg | Freq: Once | INTRAVENOUS | Status: DC | PRN
  Filled 2021-07-06: qty 1

## 2021-07-06 NOTE — ED Notes (Signed)
Pt was accepted to [facility] 4N-room 23 report # [phone]

## 2021-07-06 NOTE — Progress Notes (Addendum)
STROKE TEAM PROGRESS NOTE   INTERVAL HISTORY Her daughters are at the bedside.  Echo being done now. MRI and MRA scheduled for 1500. She is from an assisted living facility that does offer PT/OT. Discuss course of care and ASPIRE trial with daughters.  CT scan shows 1.8 x 1.5 x 3.2 cm right thalamic parenchymal hemorrhage with mild surrounding cytotoxic edema but no hydrocephalus or significant right-to-left midline shift.  Patient continues to have dense left hemiplegia with mild dysarthria neurological exam stable.  Blood pressure adequately controlled on Cleviprex drip. Vitals:   07/06/21 0700 07/06/21 0715 07/06/21 0730 07/06/21 0745  BP: 136/64 125/67 (!) 127/53 (!) 129/54  Pulse: 66 67 67 66  Resp: (!) 26 20 (!) 22 20  Temp:      TempSrc:      SpO2: 91% (!) 87% 92% 91%   CBC:  Recent Labs  Lab 07/06/21 0406  WBC 12.0*  NEUTROABS 9.1*  HGB 12.5  HCT 39.5  MCV 94.3  PLT A999333   Basic Metabolic Panel:  Recent Labs  Lab 07/06/21 0406  NA 137  K 3.9  CL 103  CO2 27  GLUCOSE 104*  BUN 23  CREATININE 0.70  CALCIUM 8.8*   Lipid Panel: No results for input(s): CHOL, TRIG, HDL, CHOLHDL, VLDL, LDLCALC in the last 168 hours. HgbA1c: No results for input(s): HGBA1C in the last 168 hours. Urine Drug Screen:  Recent Labs  Lab 07/06/21 0440  LABOPIA NONE DETECTED  COCAINSCRNUR NONE DETECTED  LABBENZ NONE DETECTED  AMPHETMU NONE DETECTED  THCU NONE DETECTED  LABBARB NONE DETECTED    Alcohol Level  Recent Labs  Lab 07/06/21 0406  ETH <10    IMAGING past 24 hours CT HEAD CODE STROKE WO CONTRAST  Result Date: 07/06/2021 CLINICAL DATA:  Code stroke.  Initial evaluation for acute stroke. EXAM: CT HEAD WITHOUT CONTRAST TECHNIQUE: Contiguous axial images were obtained from the base of the skull through the vertex without intravenous contrast. RADIATION DOSE REDUCTION: This exam was performed according to the departmental dose-optimization program which includes automated  exposure control, adjustment of the mA and/or kV according to patient size and/or use of iterative reconstruction technique. COMPARISON:  Head CT from 03/09/2020. FINDINGS: Brain: Acute intraparenchymal hemorrhage measuring 1.8 x 1.5 x 3.2 cm seen centered at the right thalamus (estimated volume 4 mL). Surrounding vasogenic edema without significant regional mass effect. No intraventricular extension. No other acute intracranial hemorrhage. No other large vessel territory infarct. Atrophy with chronic microvascular ischemic disease noted. No mass lesion or midline shift. No hydrocephalus or extra-axial fluid collection. Vascular: No hyperdense vessel. Calcified atherosclerosis present at skull base. Skull: 1.6 cm nodular soft tissue lesion noted at the right temporal scalp, indeterminate. Possible multifocal soft tissue contusions about the scalp vertex. Calvarium intact. Sinuses/Orbits: Globes and orbital soft tissues demonstrate no acute finding. Paranasal sinuses and mastoid air cells are clear. Other: None. ASPECTS [facility] Stroke Program Early CT Score) Acute intracranial hemorrhage. IMPRESSION: 1. 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 Surrounding vasogenic edema without significant regional mass effect. A hypertensive etiology is suspected. 2. Underlying atrophy with chronic small vessel ischemic disease. 3. Multifocal soft tissue contusions about the scalp vertex. Calvarium intact. 4. 1.6 cm nodular soft tissue lesion at the right temporal scalp, indeterminate. Correlation with physical exam recommended. Critical Value/emergent results were called by telephone at the time of interpretation on 07/06/2021 at 3:35 am to provider [facility]  JESSUP , who verbally acknowledged these results. Electronically Signed   By: Jeannine Boga M.D.   On: 07/06/2021 03:38    PHYSICAL EXAM  Physical Exam  Constitutional: Appears well-developed and  well-nourished pleasant elderly Caucasian lady.  Psych: Affect appropriate to situation Cardiovascular: Normal rate and regular rhythm.  Respiratory: Effort normal, non-labored breathing, 2 L nasal cannula  Neuro: Mental Status: Patient is awake, alert, oriented to person, place, month, year, and situation.  Mild dysarthria noted.  Patient is able to give a clear and coherent history. Cranial Nerves: II: Pupils are equal, round, and reactive to light.  Poor vision at baseline III,IV, VI: EOMI without ptosis or diploplia.  V: Facial sensation is symmetric to temperature VII: Left facial droop VIII: Hearing is intact to voice X: Palate elevates symmetrically XI: Shoulder shrug is symmetric. XII: Tongue protrudes midline without atrophy or fasciculations.  Motor: Tone is normal. Bulk is decreased. Significant drift noted in left upper and lower extremity.  Hand grasp weak on left side. RUE 5/5 LUE 2/5 RLE 5/5  LLE 2/5 Sensory: Decreased sensation in left arm and left leg Plantars: Toes are downgoing bilaterally.  Coordination: Weakness on the left, tremor noted on the right with FTN   ASSESSMENT/PLAN [name] is a 81 y.o. female with history of atrial fibrillation who takes Eliquis presenting with  left-sided weakness and confusion.  Her BP was 158 SBP on arrival. Head CT revealed a thalamic IPH and she has therefore been transferred to [facility] for further management.  WBC 12.0, all other labs unremarkable.  Mild UTI, Macrobid started.  Cleviprex infusing for blood pressure control, PRNs ordered and home meds restarted for hypertension.  MRI pending, echo pending.   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 Underlying atrophy with chronic small vessel ischemic disease. Multifocal soft tissue contusions  about the scalp vertex. 1.6 cm nodular soft tissue lesion at the right temporal scalp.  MRI - Pending   MRA  Pending 2D Echo results pending LDL 48 HgbA1c 5.3 VTE prophylaxis -SCDs Eliquis (apixaban) daily prior to admission, now on No antithrombotic.  Hold anticoagulation until it is appropriate to restart Therapy recommendations: Pending Disposition: Pending  Atrial fibrillation Home medication: Eliquis 5 mg twice daily, amiodarone 200 mg daily Currently rate controlled  Hypertension Home meds: Lopressor 12.5 mg, resumed Stable Keep blood pressure less than systolic 0000000 Cleviprex IV infusion-titrate down as able Utilize as needed medications: Labetalol, hydralazine  Other Stroke Risk Factors Advanced Age >/= 44  Family hx stroke (Mother, Sister)  Other Active Problems Anxiety/Depression Klonopin 0.5 mg 4 times a day, Lexapro 5 mg daily Klonopin restarted Mild UTI Macrobid 100 mg q12 ordered Reporting urinary frequency Urine culture sent Urinalysis showed moderate Hgb, moderate leukocytes  Hospital day # 0  Patient seen and examined by NP/APP with MD. MD to update note as needed.   Janine Ores, DNP, FNP-BC [facility] Pager: [phone] I have personally obtained history,examined this patient, reviewed notes, independently viewed imaging studies, participated in medical decision making and plan of care.ROS completed by me personally and pertinent positives fully documented  I have made any additions or clarifications directly to the above note. Agree with note above.  Patient presented with left hemiparesis and dysarthria due to right thalamic intracerebral hemorrhage likely from underlying hypertension with  being on anticoagulation with Eliquis.  Recommend close neurological monitoring and strict blood pressure control with systolic goal between [id number] for the first 24 hours and below 160.  Check swallow eval and resume home blood pressure medications and  use as needed IV labetalol and hydralazine and wean off Cleviprex drip as tolerated.  Check MRI scan of the brain and MRA later today.  Mobilize out of bed.  Therapy consults.  Patient may also consider possible participation in the ASPIRE trial later if interested. (Resuming Eliquis versus aspirin in patients with A. fib and intracerebral hemorrhage ).  Long discussion with patient and her 2 daughters and answered questions.This patient is critically ill and at significant risk of neurological worsening, death and care requires constant monitoring of vital signs, hemodynamics,respiratory and cardiac monitoring, extensive review of multiple databases, frequent neurological assessment, discussion with family, other specialists and medical decision making of high complexity.I have made any additions or clarifications directly to the above note.This critical care time does not reflect procedure time, or teaching time or supervisory time of PA/NP/Med Resident etc but could involve care discussion time.  I spent 40 minutes of neurocritical care time  in the care of  this patient.      Antony Contras, MD Medical Director [place] Pager: [phone] 07/06/2021 3:44 PM   To contact Stroke Continuity provider, please refer to [email or web address] After hours, contact General Neurology

## 2021-07-06 NOTE — ED Notes (Signed)
Per Kathy(Staff Med tech) at [facility]: Knocking could be heard by staff at 0145 this AM and staff found pt on floor of bathroom with her head against wall with bleeding to the left side of eye. Per staff, pt was pale in color and when staff spoke to pt she was not able to focus gaze and left eye looked smaller than the other. First responders assisted pt to wheelchair and attempted to transport pt to ED but pt refused. Per EMS, pt had left sided deficits with weakness, left facial droop and slurred speech. EMS reports finally convinced once daughter was contacted by phone and spoke with pt. Per, EMS pt was able to answer all questions appropriately and was alert and oriented x4. While pt in wheelchair awaiting transport to stretcher, pt fell out of wheelchair and hit her head. Pt is on Eliquis 5mg .  On arrival to ED, pt unable to lift left arm, left leg, gaze to the right, slurred speech with slight facial droop noted on left side of face. Pt taken straight to CT post initial clearance by MD Jess[place].

## 2021-07-06 NOTE — ED Triage Notes (Signed)
CBG 102 

## 2021-07-06 NOTE — Progress Notes (Signed)
°  Transition of Care [facility]) Screening Note   Patient Details  Name: Michelle Bauer Date of Birth: October 11, 1940   Transition of [facility]) CM/SW Contact:    Mearl Latin, LCSW Phone Number: 07/06/2021, 10:13 AM    Transition of Care Department [facility]) has reviewed patient. Patient resides at [facility]. We will continue to monitor patient advancement through interdisciplinary progression rounds. If new patient transition needs arise, please place a TOC consult.

## 2021-07-06 NOTE — ED Provider Notes (Addendum)
[facility] Provider Note    Event Date/Time   First MD Initiated Contact with Patient 07/06/21 [phone]     (approximate)   History   Chief Complaint Code Stroke and Fall   HPI  Michelle Bauer is a 81 y.o. female with past medical history of hypertension, hyperlipidemia, CAD, and atrial fibrillation on Eliquis who presents to the ED for code stroke.  History is limited due to patient's confusion and majority of history obtained from EMS.  [facility], where staff heard a thud around 130 and found patient down on the ground.  She had a small abrasion to the left side of her forehead and seemed to be having difficulty moving her left side.  EMS was called and patient initially refused transport but was eventually convinced to come to the ED for further evaluation.  EMS notes slurred speech with inability to move her left side.  Patient states she is not sure why she fell, states she feels fine and denies any complaints.      Physical Exam   Triage Vital Signs: ED Triage Vitals  Enc Vitals Group     BP      Pulse      Resp      Temp      Temp src      SpO2      Weight      Height      Head Circumference      Peak Flow      Pain Score      Pain Loc      Pain Edu?      Excl. in GC?     Most recent vital signs: Vitals:   07/06/21 0321 07/06/21 0355  BP: (!) 220/58 (!) 158/55  Pulse: 60 60  Resp: 20 20  Temp: 97.8 F (36.6 C)   SpO2: 100% 94%    Constitutional: Awake and alert, disoriented. Eyes: Conjunctivae are normal.  Right gaze preference noted.  Pupils equal, round, and reactive to light bilaterally. Head: Atraumatic. Nose: No congestion/rhinnorhea. Mouth/Throat: Mucous membranes are dry.  Neck: No midline cervical spine tenderness to palpation. Cardiovascular: Normal rate, regular rhythm. Grossly normal heart sounds.  2+ radial pulses bilaterally. Respiratory: Normal respiratory effort.  No  retractions. Lungs CTAB. Gastrointestinal: Soft and nontender. No distention. Musculoskeletal: No lower extremity tenderness nor edema.  Neurologic: Slurred speech noted.  Left facial droop noted.  Left hemiparesis noted with associated neglect.  5 out of 5 strength noted in right upper and lower extremities.    ED Results / Procedures / Treatments   Labs (all labs ordered are listed, but only abnormal results are displayed) Labs Reviewed  CBG MONITORING, ED - Abnormal; Notable for the following components:      Result Value   Glucose-Capillary 102 (*)    All other components within normal limits  RESP PANEL BY RT-PCR (FLU A&B, COVID) ARPGX2  ETHANOL  PROTIME-INR  APTT  CBC  DIFFERENTIAL  COMPREHENSIVE METABOLIC PANEL  URINE DRUG SCREEN, QUALITATIVE ([facility] ONLY)  URINALYSIS, ROUTINE W REFLEX MICROSCOPIC     EKG  ED ECG REPORT I, Chesley Noon, the attending physician, personally viewed and interpreted this ECG.   Date: 07/06/2021  EKG Time: 3:24  Rate: 60  Rhythm: normal sinus rhythm  Axis: Normal  Intervals:none  ST&T Change: None  RADIOLOGY CT head reviewed by me with right thalamic hemorrhage, no obvious midline shift.  Radiology read is pending.  PROCEDURES:  Critical Care performed: Yes, see critical care procedure note(s)  .Critical Care Performed by: Blake Divine, MD Authorized by: Blake Divine, MD   Critical care provider statement:    Critical care time (minutes):  45   Critical care time was exclusive of:  Separately billable procedures and treating other patients and teaching time   Critical care was necessary to treat or prevent imminent or life-threatening deterioration of the following conditions:  CNS failure or compromise   Critical care was time spent personally by me on the following activities:  Development of treatment plan with patient or surrogate, discussions with consultants, evaluation of patient's response to treatment,  examination of patient, ordering and review of laboratory studies, ordering and review of radiographic studies, ordering and performing treatments and interventions, pulse oximetry, re-evaluation of patient's condition and review of old charts   I assumed direction of critical care for this patient from another provider in my specialty: no     Care discussed with: accepting provider at another facility   .1-3 Lead EKG Interpretation Performed by: Blake Divine, MD Authorized by: Blake Divine, MD     Interpretation: normal     ECG rate:  65-80   ECG rate assessment: normal     Rhythm: sinus rhythm     Ectopy: none     Conduction: normal     MEDICATIONS ORDERED IN ED: Medications  coag fact Xa recombinant (ANDEXXA) low dose infusion 900 mg (has no administration in time range)  clevidipine (CLEVIPREX) infusion 0.5 mg/mL (4 mg/hr Intravenous Rate/Dose Change 07/06/21 0350)  lactated ringers bolus 1,000 mL (1,000 mLs Intravenous New Bag/Given 07/06/21 0347)     IMPRESSION / MDM / ASSESSMENT AND PLAN / ED COURSE  I reviewed the triage vital signs and the nursing notes.                              81 y.o. female with past medical history of hypertension, hyperlipidemia, CAD, and atrial fibrillation on Eliquis who presents to the ED after being found down at her nursing facility with left-sided deficits.  Differential diagnosis includes, but is not limited to, stroke, intracranial hemorrhage, traumatic brain injury, electrolyte abnormality, sepsis.  Patient arrives with obvious left hemiparesis, does not seem aware of these deficits and exam is consistent with left-sided neglect.  This is in addition to slurred speech with right gaze preference and left facial droop.  Code stroke was called prehospital, however there is no documentation from her nursing facility as to when she was last seen well.  She was reportedly well at the time of turnover at 11 PM at her nursing facility, but  unknown the last time she was seen.  Code stroke was called and CT head is concerning for right thalamic hemorrhage consistent with hemorrhagic stroke secondary to hypertension, no evidence of traumatic hemorrhage.  We will reverse her Eliquis with Andexxa, given blood pressure is greater than [id number] systolic, start patient on IV Cleviprex.  Case discussed with Dr. Cari Caraway of neurosurgery, who agrees with plan thus far and recommends further discussion with neurology.  Patient currently undergoing teleneurology consultation.  Tele-neurologist recommends blood pressure control with systolic less than [id number], blood pressure currently improving on Cleviprex drip and we will continue to titrate.  Neuro exam remained stable on reassessment, patient continues to protect airway.  Case discussed with Dr. Leonel Ramsay of neurology at [facility] and she  was accepted for transfer.  CBC and BMP are notable, no anemia or electrolyte abnormality noted.  INR noted to be mildly elevated.  The patient is on the cardiac monitor to evaluate for evidence of arrhythmia and/or significant heart rate changes.       FINAL CLINICAL IMPRESSION(S) / ED DIAGNOSES   Final diagnoses:  Hemorrhagic stroke ([place])     Rx / DC Orders   ED Discharge Orders     None        Note:  This document was prepared using Dragon voice recognition software and may include unintentional dictation errors.   Blake Divine, MD 07/06/21 0410    Blake Divine, MD 07/06/21 Cindie Laroche    Blake Divine, MD 07/06/21 [phone]

## 2021-07-06 NOTE — Consult Note (Signed)
[facility] Tele[facility]urology Consult Services   Patient Name:   Michelle Bauer, Michelle Bauer Date of Birth:   [birth year] Identification Number:   MRN - [id number] Date of Service:   07/06/2021 03:33:10  Diagnosis:       I61.9 - Intracerebral haemorrhage, unspecified  Impression: Michelle Bauer is a 81 year-old woman with a PMH of HTN, HLD, A.fib (on Eliquis) who was BIBEMS for fall and left sided weakness. tPA was not given due to ICH.  Metrics: Last Known Well: 07/05/2021 21:00:00 [facility] Notification Time: 07/06/2021 03:33:10 Arrival Time: 07/06/2021 03:04:00 Stamp Time: 07/06/2021 03:33:10 Initial Response Time: 07/06/2021 03:38:24 Symptoms: Left sided weakness. NIHSS Start Assessment Time: 07/06/2021 03:42:17 Patient is not a candidate for Thrombolytic. Thrombolytic Medical Decision: 07/06/2021 03:40:51 Patient was not deemed candidate for Thrombolytic because of following reasons: Last Well Known Above 4.5 Hours. Current or Previous ICH.  I personally Reviewed the CT Head and it Showed right thalamic ICH  ED Physician notified of diagnostic impression and management plan on 07/06/2021 04:04:30   Recommendation:  Diagnostic Studies:      Repeat CT head in first 8-12hrs      CTA head and neck with contrast  Laboratory Studies:       INR/PT       aPTT?       CBC  Medications:       Hold?antiplatelet?therapy/NSAIDS/Anticoagulation       Load with Keppra 1gm now.       Keppra 500mg  bid.  Nursing Recommendations:       Telemetry, IV Fluids?Avoid dextrose containing fluids, Maintain euglycemia       Head of bed 30 degrees       Neuro checks q1-2?hrs?during ICU stay       Once stable neuro checks q4?hrs       keep BP less than 140/90's with goal of 130/80s  Consultations:       Need Neurosurgery consultation?STAT       Recommend Speech therapy if failed dysphagia screen       Physical therapy/Occupational therapy  DVT Prophylaxis:        SCDs  Disposition:       Neurology will Follow       Neurology will sign off  ------------------------------------------------------------------------------  History of Present Illness: Patient is a 81 year old Female.  Patient was brought by EMS for symptoms of Left sided weakness. Michelle Bauer is a 81 year-old woman with a PMH of HTN, HLD, A.fib (on Eliquis), Amabulates with a walker, who was BIBEMS for fall and left sided weakness. As per ER staff, Michelle Bauer presented from a nursing facility. She was found on the floor with left sided weakness. Michelle Bauer states that she fell on the floor and she was banging on the walker to try to get someone' attention because she did not have her medical alert necklace on. She reports prior falls.   Past Medical History:      Hypertension      Hyperlipidemia      Atrial Fibrillation  Medications:  Anticoagulant use:  Yes Eliquis No Antiplatelet use Reviewed EMR for current medications  Allergies:  Reviewed Description: PCN, Sulfa  Social History: Drug Use: No  Family History:  There is no family history of premature cerebrovascular disease pertinent to this consultation  ROS : 14 Points Review of Systems was performed and was negative except mentioned in HPI.  Past Surgical History: There Is No Surgical History Contributory To Todays Visit  Examination: BP(220/58), Pulse(57), 1A: Level of Consciousness - Alert; keenly responsive + 0 1B: Ask Month and Age - Both Questions Right + 0 1C: Blink Eyes & Squeeze Hands - Performs Both Tasks + 0 2: Test Horizontal Extraocular Movements - Normal + 0 3: Test Visual Fields - No Visual Loss + 0 4: Test Facial Palsy (Use Grimace if Obtunded) - Minor paralysis (flat nasolabial fold, smile asymmetry) + 1 5A: Test Left Arm Motor Drift - No Drift for 10 Seconds + 0 5B: Test Right Arm Motor Drift - No Drift for 10 Seconds + 0 6A: Test Left Leg Motor Drift - No Movement + 4 6B: Test Right Leg  Motor Drift - Drift, hits bed + 2 7: Test Limb Ataxia (FNF/Heel-Shin) - No Ataxia + 0 8: Test Sensation - Complete Loss: Cannot Sense Being Touched At All + 2 9: Test Language/Aphasia - Normal; No aphasia + 0 10: Test Dysarthria - Normal + 0 11: Test Extinction/Inattention - No abnormality + 0 NIHSS Score: 9  ICH Score: 0  NIHSS Text : - Left forehead bruising   GlasGow Coma Score: 13-15 (0)  Age >= 80: No (0)  ICH volume >= 51mL: No (0)  Intraventricular hemorrhage: No (0)  Infratentorial origin of hemorrhage: No (0)  Pre-Morbid Modified Rankin Scale: 4 Points = Moderately severe disability; unable to walk and attend to bodily needs without assistance   Patient/Family was informed the Neurology Consult would occur via TeleHealth consult by way of interactive audio and video telecommunications and consented to receiving care in this manner.   Dr Asa Saunas  [facility] [phone] Case [id number]

## 2021-07-06 NOTE — H&P (Signed)
Neurology H&P  CC: Altered mental status  History is obtained from: Chart review  HPI: Michelle Bauer is a 81 y.o. female with a history of atrial fibrillation who takes Eliquis at baseline she was noted to be confused with left-sided weakness around 1:30 AM and to the [facility] as a code stroke. Her BP was 158 SBP on arrival. There, a head CT revealed a thalamic IPH and she has therefore been transferred to [facility] for further managemetn.   She also complains of urinary frequency.    LKW: 9 PM tpa given?: No, ICH IR Thrombectomy? No, ICH  NIHSS score: 11 1A: Level of Consciousness - 0 1B: Ask Month and Age - 0 1C: 'Blink Eyes' & 'Squeeze Hands' - 0 2: Test Horizontal Extraocular Movements - 0 3: Test Visual Fields - 0 4: Test Facial Palsy - 1 5A: Test Left Arm Motor Drift - 3 5B: Test Right Arm Motor Drift - 0 6A: Test Left Leg Motor Drift - 3 6B: Test Right Leg Motor Drift - 0 7: Test Limb Ataxia - 0 8: Test Sensation - 2 9: Test Language/Aphasia- 0 10: Test Dysarthria - 1 11: Test Extinction/Inattention - 1     Past Medical History:  Diagnosis Date   Anxiety    Depression    Essential hypertension    Hyperlipidemia    PAF (paroxysmal atrial fibrillation) ([place])    a. 02/2020 Post-op Afib after R Femur FX and IM nail (also hypokalemic)-->Amio/Eliquis (CHA2DS2VASc = 4).     Family History  Problem Relation Age of Onset   Stroke Mother    Heart attack Father    Alcohol abuse Father    Stroke Sister    Diabetes Brother    Colon cancer Sister      Social History:  reports that she has never smoked. She has never used smokeless tobacco. She reports that she does not drink alcohol and does not use drugs.   Prior to Admission medications   Medication Sig Start Date End Date Taking? Authorizing Provider  acetaminophen (TYLENOL) 500 MG tablet TAKE 2 TABS (1000MG ) BY MOUTH THREE TIMES DAILY 09/10/20   [provider]  amiodarone  (PACERONE) 200 MG tablet Take 1 tablet (200 mg total) by mouth daily. 03/29/20   Loel Dubonnet, NP  apixaban (ELIQUIS) 5 MG TABS tablet Take 1 tablet (5 mg total) by mouth 2 (two) times daily. 07/15/20   Loel Dubonnet, NP  clonazePAM (KLONOPIN) 0.5 MG tablet Take 1 tablet (0.5 mg total) by mouth 4 (four) times daily. TAKE 1 TABLET(0.5 MG) BY MOUTH FOUR TIMES DAILY 03/22/21   Clapacs, Madie Reno, MD  escitalopram (LEXAPRO) 5 MG tablet TAKE 1 TABLET(5 MG) BY MOUTH DAILY 03/22/21   Clapacs, Madie Reno, MD  ferrous sulfate 325 (65 FE) MG tablet Take 1 tablet (325 mg total) by mouth daily with breakfast. 03/17/20   Jennye Boroughs, MD  metoprolol tartrate (LOPRESSOR) 25 MG tablet Take 0.5 tablets (12.5 mg total) by mouth 2 (two) times daily. Patient taking differently: Take 25 mg by mouth 2 (two) times daily. 03/29/20   Loel Dubonnet, NP  Multiple Vitamins-Minerals (ONE-A-DAY WOMENS 50 PLUS) TABS Take 1 tablet by mouth daily.    [provider]  Potassium Gluconate 550 MG TABS Take 1,100 mg by mouth daily.    [provider]  traMADol (ULTRAM) 50 MG tablet Take 2 tablets (100 mg total) by mouth in the morning, at noon, and at  bedtime. 03/31/21   Clapacs, Madie Reno, MD  vitamin B-12 1000 MCG tablet Take 1 tablet (1,000 mcg total) by mouth daily. 03/18/20   Jennye Boroughs, MD  VITAMIN D PO Take 2,000 Int'l Units by mouth daily.    [provider]     Exam: Current vital signs: There were no vitals taken for this visit.   Physical Exam  Constitutional: Appears well-developed and well-nourished.  Psych: Affect appropriate to situation Eyes: No scleral injection HENT: No OP obstrucion Head: Normocephalic.  Cardiovascular: Normal rate and regular rhythm.  Respiratory: Effort normal and breath sounds normal to anterior ascultation GI: Soft.  No distension. There is no tenderness.  Skin: WDI  Neuro: Mental Status: Patient is awake, alert, oriented to person, place, month,  year, and situation. Patient is able to give a clear and coherent history. No signs of aphasia or neglect Cranial Nerves: II: Visual Fields are full. R pupil very slightly smaller than left, (patient states this is baseline) both are reactive.  III,IV, VI: EOMI without ptosis or diplopia.  V: Facial sensation is diminished on the left, but still with some sensation, extinguishes VII: Facial movement with left lower facial weakness Motor: Minimal movement on the left with mil increase in ttone in the left arm. She is abel to grip hand and move leg with 2/5 strength.  Sensory: Sensation is absent in the left arm and leg Cerebellar: Consistent with weakness on left, mild tremor on right.    I have reviewed labs in epic and the pertinent results are: CBC WBC 12 CBC Hgb 12.5 Na 137 K 3.9 Cr 0.7 AST,ALT nml UDS negative UA with leukocytes  I have reviewed the images obtained:CT head -   Primary Diagnosis:  Nontraumatic intracerebral hemorrhage in hemisphere, subcortical  Secondary Diagnosis: Cerebral edema, Essential (primary) hypertension, Hypertension Emergency (SBP > 180 or DBP > 120 & end organ damage), and Paroxysmal atrial fibrillation   Impression: 81 yo F thalamic hemorrhage in the setting of eliquis that is likely hypertensive in nature. She has expressed a wish to be DNR in the setting of cardiopulmonary arrest, but full support short of this.   Plan: 1) Admit to ICU 2) no antiplatelets or anticoagulants 3) blood pressure control with goal systolic [id number] - [id number] 4) Frequent neuro checks 5) If symptoms worsen or there is decreased mental status, repeat stat head CT 6) PT,OT,ST 7) macrobid for presumed UTI(can't have PCN or sulfa)   This patient is critically ill and at significant risk of neurological worsening, death and care requires constant monitoring of vital signs, hemodynamics,respiratory and cardiac monitoring, neurological assessment, discussion with family,  other specialists and medical decision making of high complexity. I spent 50 minutes of neurocritical care time  in the care of  this patient. This was time spent independent of any time provided by nurse practitioner or PA.  Roland Rack, MD [facility] [phone]  If 7pm- 7am, please page neurology on call as listed in [place].

## 2021-07-06 NOTE — TOC CAGE-AID Note (Signed)
Transition of Care [facility]) - CAGE-AID Screening   Patient Details  Name: DAUN PALMERTREE MRN: [id number] Date of Birth: 03-03-1941  Transition of Care [facility]) CM/SW Contact:    Carmino Ocain C Tarpley-Carter, [place] Phone Number: 07/06/2021, 12:28 PM   Clinical Narrative: Pt is unable to participate in Cage Aid.  Treyton Slimp Tarpley-Carter, MSW, LCSW-A Pronouns:  She/Her/Hers [place] Transitions of Care Clinical Social Worker Direct Number:  [phone] [email or web address]   CAGE-AID Screening: Substance Abuse Screening unable to be completed due to: : Patient unable to participate             Substance Abuse Education Offered: No

## 2021-07-06 NOTE — Progress Notes (Incomplete)
STUDENT FOLLOW-UP NOTES - DISREGARD                  CC/HPI: 63 YOF presents to [facility] ED with left hemiparesis, slurred speech, right gaze preference, and left facial droop. She was found on the floor of the bathroom at her nursing home with her head against the wall and bleeding to the left side of the eye. Her BP in the ED was 220/58 and CT is concerning for right thalamic hemorrhage, secondary to HTN.   PMH: HTN, HLD, CAD, Afib on Eliquis, falls, GI bleed  Dx: ICH  PLAN: -continue ceftriaxone 1g  -check for cefdinir sensitivity? Switch to PO meds tomorrow  -tolerating ceftriaxone well but feeling cold  -Keflex 250mg  q6h x5d total  -f/u AC 48h  -CT head 1/18 unchanged appearance -Lovenox 40mg  q24h ppx -d/c senokot-s  -few loose stools, not watery -restart home meds?  -initiate lexapro  -increase lopressor to home dose -lasix 20mg  IV x 1, UOP 545mL, I/O +1.2L; imaging shows possible fluid at L lung base; f/u CT  -f/u BMP  -give K 40 mEq -f/u CBC    Anticoag: SCDs, s/p 900mg  Andexxa 1/18 0421, Hgb 12.5, Plts 221 1/19 Hgb 11.7, Plts 229  ID: Day 3/5? Abx; WBC 12.0 >> 8.1, afebrile Penicillin + Sulfa allergy, tolerated Cefazolin in the past 1/20 WBC ***; afebrile  Antimicrobials this admission: Macrobid 1/18 >> 1/19 Ceftriaxone 1g q24h 1/19 >>  Microbiology results: 1/18 UA: moderate leukocytes, nitrate negative 1/18 UCx: >= 100k CFU E. Coli  -Susceptible ceftriaxone <=0.25 1/18 RP: pending 1/18 MRSA PCR: pending  CV: EF 55-60%, LDL 103, TG 155, SBP goal 120-140, Cleviprex drip; CHADSVASC: 4 1/20 SBP > 140, add on ACEi?   Endocrine: A1c 5.3 (2021) 1/19 Glucose 110-149 per KDIGO, 140-180 per NICE-SUGAR; glucose checks? 1/20 controlled  GI/Nutrition: PPI, Senokot-S; AST/ALT normal, LBM 1/18 1/20 LBM 1/19, Senokot-S LD 1/18 1054  Neuro: A/O X4, CT R. thalamic hemorrhage secondary to hypertension; Keppra 1/19 Repeat CT shows unchanged  appearance  Renal: SCr 0.7, purewick, c/o dysuria  1/19 SCr 0.7 >> 1.04, s/p contrast+macrobid 1/20 SCr 1.04 >> 0.88, AKI resolved  Pulm: 4L [place] Heme/Onc: s/p Andexxa 1/18 0421, PT 16.9, INR 1.4 PTA Med Issues: Apixaban 5mg  BID, Clonazepam 0.5mg  QID, Ferrous sulfate 325 QD, Lopressor 25mg  BID, MVI, Potassium gluconate 1100 QD, Tramadol 100mg  TID, Vit B12 1049mcg QD, Vitamin D 2000u QD Best Practices:    Uncomplicated Lower: 3-7 days depending on agent Complicated Lower: 123456 days Upper: 5-14 days depending on agent

## 2021-07-06 NOTE — Progress Notes (Signed)
PT Cancellation Note  Patient Details Name: MARIANNY GORIS MRN: [id number] DOB: 1941/06/06   Cancelled Treatment:    Reason Eval/Treat Not Completed: Active bedrest order Will evaluate once activity orders are updated.   Carleta Woodrow A. Dan Humphreys PT, DPT Acute Rehabilitation Services Pager [phone] Office [phone]    Viviann Spare 07/06/2021, 8:00 AM

## 2021-07-06 NOTE — ED Notes (Signed)
Pt to CT with EMS and this RN

## 2021-07-06 NOTE — ED Notes (Signed)
Pt in route to CT at this time by EMS with ED RN and CT ready in CT room for pt.

## 2021-07-06 NOTE — ED Notes (Signed)
[facility] called spoke w/Tara for possible transfer to [facility] ICU @ 4:02am

## 2021-07-06 NOTE — Progress Notes (Signed)
° °  Echocardiogram 2D Echocardiogram has been performed.  Festus Barren 07/06/2021, 10:02 AM

## 2021-07-06 NOTE — ED Triage Notes (Signed)
Pt arrived via [place] called out in field. Pt from [facility] facility. Per Kathy(Staff Med tech) at [facility]: Knocking could be heard by staff at 0145 this AM and staff found pt on floor of bathroom with her head against wall with bleeding to the left side of eye. Per staff, pt was pale in color and when staff spoke to pt she was not able to focus gaze and left eye looked smaller than the other. First responders assisted pt to wheelchair and attempted to transport pt to ED but pt refused. Per EMS, pt had left sided deficits with weakness, left facial droop and slurred speech. EMS reports finally convinced once daughter was contacted by phone and spoke with pt. Per, EMS pt was able to answer all questions appropriately and was alert and oriented x4. While pt in wheelchair awaiting transport to stretcher, pt fell out of wheelchair and hit her head. Pt is on Eliquis 5mg .  On arrival to ED, pt unable to lift left arm, left leg, gaze to the right, slurred speech with slight facial droop noted on left side of face. Pt taken straight to CT post initial clearance by MD [place].  EMS VS CBG 155 BP 224/92  HR 54 O2 RA 94%

## 2021-07-06 NOTE — ED Notes (Signed)
Pt was accepted at [facility] 4N-room23 Report # [phone]

## 2021-07-06 NOTE — Progress Notes (Signed)
Chaplain offered prayer from inside patient's door way as staff attended to patient. No family present at this time

## 2021-07-07 LAB — CBC
HCT: 36.8 % (ref 36.0–46.0)
Hemoglobin: 11.7 g/dL — ABNORMAL LOW (ref 12.0–15.0)
MCH: 29.5 pg (ref 26.0–34.0)
MCHC: 31.8 g/dL (ref 30.0–36.0)
MCV: 92.9 fL (ref 80.0–100.0)
Platelets: 229 10*3/uL (ref 150–400)
RBC: 3.96 MIL/uL (ref 3.87–5.11)
RDW: 14.4 % (ref 11.5–15.5)
WBC: 8.1 10*3/uL (ref 4.0–10.5)
nRBC: 0 % (ref 0.0–0.2)

## 2021-07-07 LAB — BASIC METABOLIC PANEL
Anion gap: 11 (ref 5–15)
BUN: 22 mg/dL (ref 8–23)
CO2: 24 mmol/L (ref 22–32)
Calcium: 8.8 mg/dL — ABNORMAL LOW (ref 8.9–10.3)
Chloride: 103 mmol/L (ref 98–111)
Creatinine, Ser: 1.04 mg/dL — ABNORMAL HIGH (ref 0.44–1.00)
GFR, Estimated: 55 mL/min — ABNORMAL LOW (ref >60)
Glucose, Bld: 117 mg/dL — ABNORMAL HIGH (ref 70–99)
Potassium: 3.5 mmol/L (ref 3.5–5.1)
Sodium: 138 mmol/L (ref 135–145)

## 2021-07-07 LAB — LIPID PANEL
Cholesterol: 178 mg/dL (ref 0–200)
HDL: 44 mg/dL (ref >40)
LDL Cholesterol: 103 mg/dL — ABNORMAL HIGH (ref 0–99)
Total CHOL/HDL Ratio: 4 RATIO
Triglycerides: 155 mg/dL — ABNORMAL HIGH (ref <150)
VLDL: 31 mg/dL (ref 0–40)

## 2021-07-07 LAB — HEMOGLOBIN A1C
Hgb A1c MFr Bld: 5.5 % (ref 4.8–5.6)
Mean Plasma Glucose: 111.15 mg/dL

## 2021-07-07 MED ORDER — CLONAZEPAM 0.5 MG PO TABS
0.5000 mg | ORAL_TABLET | Freq: Two times a day (BID) | ORAL | Status: DC | PRN
  Administered 2021-07-08 – 2021-07-11 (×3): 0.5 mg via ORAL
  Filled 2021-07-07 (×3): qty 1

## 2021-07-07 MED ORDER — CLONAZEPAM 0.5 MG PO TABS
0.5000 mg | ORAL_TABLET | Freq: Two times a day (BID) | ORAL | Status: DC
  Administered 2021-07-07 – 2021-07-15 (×17): 0.5 mg via ORAL
  Filled 2021-07-07 (×17): qty 1

## 2021-07-07 MED ORDER — LACTATED RINGERS IV SOLN: INTRAVENOUS | Status: DC

## 2021-07-07 MED ORDER — PANTOPRAZOLE SODIUM 40 MG PO TBEC
40.0000 mg | DELAYED_RELEASE_TABLET | Freq: Every day | ORAL | Status: DC
  Administered 2021-07-07 – 2021-07-14 (×8): 40 mg via ORAL
  Filled 2021-07-07 (×8): qty 1

## 2021-07-07 MED ORDER — SODIUM CHLORIDE 0.9 % IV SOLN
1.0000 g | INTRAVENOUS | Status: DC
  Administered 2021-07-07 – 2021-07-08 (×2): 1 g via INTRAVENOUS
  Filled 2021-07-07 (×3): qty 10

## 2021-07-07 NOTE — Evaluation (Signed)
Physical Therapy Evaluation Patient Details Name: Michelle Bauer MRN: [id number] DOB: 1941/02/06 Today's Date: 07/07/2021  History of Present Illness  81 y/o female presented to [facility] ED on 07/06/21 following fall hitting head at ALF and after EMS arrived, patient with slurred speech and L sided weakness with subsequent fall out of wheelchair with EMS present. 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 PMH: Afib, HTN, anxiety, scoliosis  Clinical Impression  Patient admitted with above diagnosis. Patient from ALF where she was ambulatory with RW and required assistance for ADLs. Patient currently presents with L sided weakness, impaired balance, decreased activity tolerance, impaired functional mobility, and impaired cognition. Patient requires maxA for bed mobility and maxA+2 for transfers OOB. Patient also with poor sitting balance with heavy L lateral lean and pushing with R UE. Patient will benefit from skilled PT services during acute stay to address listed deficits. Recommend SNF at discharge to maximize functional mobility and safety.        Recommendations for follow up therapy are one component of a multi-disciplinary discharge planning process, led by the attending physician.  Recommendations may be updated based on patient status, additional functional criteria and insurance authorization.  Follow Up Recommendations Skilled nursing-short term rehab (<3 hours/day)    Assistance Recommended at Discharge Frequent or constant Supervision/Assistance  Patient can return home with the following  Two people to help with walking and/or transfers;Two people to help with bathing/dressing/bathroom;Assistance with cooking/housework;Assistance with feeding;Direct supervision/assist for financial management;Direct supervision/assist for medications management;Assist for transportation    Equipment Recommendations Other (comment) (TBD)  Recommendations for Other Services       Functional Status  Assessment Patient has had a recent decline in their functional status and/or demonstrates limited ability to make significant improvements in function in a reasonable and predictable amount of time     Precautions / Restrictions Precautions Precautions: Fall Precaution Comments: SBP <160, hx of severe scoliosis, pusher, L inattention Restrictions Weight Bearing Restrictions: No Other Position/Activity Restrictions: scoliosis      Mobility  Bed Mobility Overal bed mobility: Needs Assistance Bed Mobility: Supine to Sit     Supine to sit: Max assist     General bed mobility comments: assist for L LE and trunk elevation    Transfers Overall transfer level: Needs assistance Equipment used: 2 person hand held assist Transfers: Sit to/from Stand, Bed to chair/wheelchair/BSC Sit to Stand: Mod assist, +2 physical assistance   Step pivot transfers: Max assist, +2 physical assistance       General transfer comment: modA+2 to stand from EOB with L knee blocked. Patient able to take steps towards recliner on R with R LE but requires assistance to advance L LE. L lateral lean in standing    Ambulation/Gait                  Stairs            Wheelchair Mobility    Modified Rankin (Stroke Patients Only) Modified Rankin (Stroke Patients Only) Pre-Morbid Rankin Score: Moderately severe disability Modified Rankin: Severe disability     Balance Overall balance assessment: Needs assistance Sitting-balance support: No upper extremity supported, Feet supported Sitting balance-Leahy Scale: Poor Sitting balance - Comments: pushing towards L side   Standing balance support: Bilateral upper extremity supported Standing balance-Leahy Scale: Zero Standing balance comment: maxA+2 to maintain standing  Pertinent Vitals/Pain Pain Assessment Pain Assessment: Faces Faces Pain Scale: No hurt Pain Intervention(s): Monitored during  session    Home Living Family/patient expects to be discharged to:: Assisted living     Type of Home: Assisted living           Home Equipment: Agricultural consultant (2 wheels);Hand held shower head;Grab bars - tub/shower;Grab bars - toilet;Shower seat      Prior Function Prior Level of Function : Needs assist             Mobility Comments: uses RW for mobility. Recently has not been ambulating to dining hall. Daughter stating "she's been down in the dumps" ADLs Comments: Supervision with showers.  Daughter's assist with home management, facility provides meals and meds.     Hand Dominance   Dominant Hand: Right    Extremity/Trunk Assessment   Upper Extremity Assessment Upper Extremity Assessment: Defer to OT evaluation LUE Deficits / Details: trace AROM LUE Sensation: decreased light touch LUE Coordination: decreased fine motor;decreased gross motor    Lower Extremity Assessment Lower Extremity Assessment: LLE deficits/detail LLE Deficits / Details: Grossly 2+/5 with 3-/5 for knee extension sitting EOB LLE Sensation: decreased light touch;decreased proprioception LLE Coordination: decreased fine motor;decreased gross motor    Cervical / Trunk Assessment Cervical / Trunk Assessment: Other exceptions Cervical / Trunk Exceptions: scoliosis at baseline - R convexity  Communication   Communication: HOH  Cognition Arousal/Alertness: Awake/alert Behavior During Therapy: WFL for tasks assessed/performed Overall Cognitive Status: History of cognitive impairments - at baseline                                 General Comments: hx of cognitive deficits per SLP notes. Following commands appropriately.        General Comments General comments (skin integrity, edema, etc.): VSS on 2L [employer]    Exercises     Assessment/Plan    PT Assessment Patient needs continued PT services  PT Problem List Decreased strength;Decreased activity tolerance;Decreased  balance;Decreased coordination;Decreased mobility;Decreased cognition;Decreased knowledge of use of DME;Decreased knowledge of precautions;Decreased safety awareness;Cardiopulmonary status limiting activity;Impaired sensation       PT Treatment Interventions DME instruction;Gait training;Functional mobility training;Therapeutic activities;Balance training;Therapeutic exercise;Neuromuscular re-education;Patient/family education;Wheelchair mobility training    PT Goals (Current goals can be found in the Care Plan section)  Acute Rehab PT Goals Patient Stated Goal: did not state PT Goal Formulation: With patient/family Time For Goal Achievement: 07/21/21 Potential to Achieve Goals: Fair    Frequency Min 3X/week     Co-evaluation PT/OT/SLP Co-Evaluation/Treatment: Yes Reason for Co-Treatment: For patient/therapist safety;To address functional/ADL transfers;Complexity of the patient's impairments (multi-system involvement) PT goals addressed during session: Mobility/safety with mobility OT goals addressed during session: ADL's and self-care       AM-PAC PT "6 Clicks" Mobility  Outcome Measure Help needed turning from your back to your side while in a flat bed without using bedrails?: Total Help needed moving from lying on your back to sitting on the side of a flat bed without using bedrails?: Total Help needed moving to and from a bed to a chair (including a wheelchair)?: Total Help needed standing up from a chair using your arms (e.g., wheelchair or bedside chair)?: Total Help needed to walk in hospital room?: Total Help needed climbing 3-5 steps with a railing? : Total 6 Click Score: 6    End of Session Equipment Utilized During Treatment: Gait belt;Oxygen Activity Tolerance:  Patient tolerated treatment well Patient left: in chair;with call bell/phone within reach;with chair alarm set;with family/visitor present Nurse Communication: Mobility status PT Visit Diagnosis:  Unsteadiness on feet (R26.81);Muscle weakness (generalized) (M62.81);Other abnormalities of gait and mobility (R26.89);History of falling (Z91.81);Other symptoms and signs involving the nervous system (U82.800)    Time: 3491-7915 PT Time Calculation (min) (ACUTE ONLY): 33 min   Charges:   PT Evaluation $PT Eval Moderate Complexity: 1 Mod          Yara Tomkinson A. Dan Humphreys PT, DPT Acute Rehabilitation Services Pager [phone] Office [phone]   Viviann Spare 07/07/2021, 5:07 PM

## 2021-07-07 NOTE — Evaluation (Signed)
Occupational Therapy Evaluation Patient Details Name: Michelle Bauer MRN: [id number] DOB: [birth year] Today's Date: 07/07/2021   History of Present Illness 81 y/o female presented to [facility] ED on 07/06/21 following fall hitting head at ALF and after EMS arrived, patient with slurred speech and L sided weakness with subsequent fall out of wheelchair with EMS present. 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 PMH: Afib, HTN, anxiety.   Clinical Impression   Patient admitted for the diagnosis above.  PTA she lives in a ALF with assist for meals and medications, and supervision for showers.  Daughter's assist with home management and community mobility.  She walked short distances with a [id number].  Deficits impacting independence are listed below.  Currently she is needing up to total assist for lower body ADL at bedlevel, and Max A of 2 for basic transfers.  Unfortunately she is needing near total assist, and her facility cannot provide this level of assist.  In addition, her family is unable to provide this level of assist.  Therefore, OT recommends short term rehab at a local SNF to maximize her functional status and assist with final disposition.  OT will follow in the acute setting.       Recommendations for follow up therapy are one component of a multi-disciplinary discharge planning process, led by the attending physician.  Recommendations may be updated based on patient status, additional functional criteria and insurance authorization.   Follow Up Recommendations  Skilled nursing-short term rehab (<3 hours/day)    Assistance Recommended at Discharge    Patient can return home with the following Two people to help with walking and/or transfers;Two people to help with bathing/dressing/bathroom;Direct supervision/assist for financial management;Assist for transportation;Assistance with cooking/housework;Assistance with feeding;Help with stairs or ramp for entrance;Direct supervision/assist for medications  management    Functional Status Assessment  Patient has had a recent decline in their functional status and demonstrates the ability to make significant improvements in function in a reasonable and predictable amount of time.  Equipment Recommendations  Wheelchair (measurements OT);Wheelchair cushion (measurements OT)    Recommendations for Other Services       Precautions / Restrictions Precautions Precautions: Fall Restrictions Weight Bearing Restrictions: No Other Position/Activity Restrictions: scoliosis      Mobility Bed Mobility Overal bed mobility: Needs Assistance Bed Mobility: Supine to Sit     Supine to sit: Max assist     General bed mobility comments: assist with L leg and elevating trunk Patient Response: Cooperative  Transfers Overall transfer level: Needs assistance   Transfers: Sit to/from Stand, Bed to chair/wheelchair/BSC Sit to Stand: Mod assist, +2 physical assistance     Step pivot transfers: Max assist, +2 physical assistance            Balance Overall balance assessment: Needs assistance Sitting-balance support: No upper extremity supported, Feet supported Sitting balance-Leahy Scale: Poor Sitting balance - Comments: patient pusing to effected L side Postural control: Left lateral lean, Posterior lean Standing balance support: Bilateral upper extremity supported Standing balance-Leahy Scale: Zero                             ADL either performed or assessed with clinical judgement   ADL Overall ADL's : Needs assistance/impaired     Grooming: Wash/dry hands;Moderate assistance;Bed level   Upper Body Bathing: Moderate assistance;Bed level   Lower Body Bathing: Total assistance;Bed level   Upper Body Dressing : Maximal assistance;Bed level   Lower Body  Dressing: Total assistance;Bed level   Toilet Transfer: Maximal assistance;+2 for physical assistance;BSC/3in1                   Vision Patient Visual  Report: No change from baseline       Perception Perception Perception: Impaired Inattention/Neglect: Does not attend to left visual field;Does not attend to left side of body   Praxis Praxis Praxis: Intact    Pertinent Vitals/Pain Pain Assessment Pain Assessment: Faces Faces Pain Scale: No hurt Pain Intervention(s): Monitored during session     Hand Dominance Right   Extremity/Trunk Assessment Upper Extremity Assessment Upper Extremity Assessment: LUE deficits/detail LUE Deficits / Details: trace AROM LUE Sensation: decreased light touch LUE Coordination: decreased fine motor;decreased gross motor   Lower Extremity Assessment Lower Extremity Assessment: Defer to PT evaluation   Cervical / Trunk Assessment Cervical / Trunk Assessment: Lordotic;Kyphotic   Communication Communication Communication: HOH   Cognition Arousal/Alertness: Awake/alert Behavior During Therapy: WFL for tasks assessed/performed Overall Cognitive Status: Difficult to assess                                 General Comments: Patient oriented to person, place and situation.  Following commands with increased time.     General Comments   VSS on O2    Exercises     Shoulder Instructions      Home Living Family/patient expects to be discharged to:: Assisted living     Type of Home: Assisted living                       Home Equipment: Rolling Walker (2 wheels);Hand held shower head;Grab bars - tub/shower;Grab bars - toilet;Shower seat          Prior Functioning/Environment                 ADLs Comments: Supervision with showers.  Daughter's assist with home management, facility provides meals and meds.        OT Problem List: Decreased strength;Decreased range of motion;Decreased activity tolerance;Impaired balance (sitting and/or standing);Impaired vision/perception;Impaired UE functional use;Decreased knowledge of use of DME or AE;Decreased safety  awareness;Decreased cognition;Decreased coordination      OT Treatment/Interventions: Self-care/ADL training;Therapeutic exercise;Therapeutic activities;Patient/family education;Balance training;DME and/or AE instruction    OT Goals(Current goals can be found in the care plan section) Acute Rehab OT Goals Patient Stated Goal: Return to her ALF OT Goal Formulation: With patient Time For Goal Achievement: 07/21/21 Potential to Achieve Goals: Fair ADL Goals Pt Will Perform Grooming: with min assist;sitting Pt Will Perform Upper Body Bathing: with min assist;sitting Pt Will Perform Upper Body Dressing: with min assist;sitting Pt Will Transfer to Toilet: with min assist;with +2 assist;stand pivot transfer;bedside commode Pt/caregiver will Perform Home Exercise Program: Increased ROM;Increased strength;Left upper extremity;With minimal assist  OT Frequency: Min 2X/week    Co-evaluation PT/OT/SLP Co-Evaluation/Treatment: Yes Reason for Co-Treatment: Complexity of the patient's impairments (multi-system involvement);For patient/therapist safety;To address functional/ADL transfers   OT goals addressed during session: ADL's and self-care      AM-PAC OT "6 Clicks" Daily Activity     Outcome Measure Help from another person eating meals?: A Lot Help from another person taking care of personal grooming?: A Lot Help from another person toileting, which includes using toliet, bedpan, or urinal?: Total Help from another person bathing (including washing, rinsing, drying)?: A Lot Help from another person to put on and taking  off regular upper body clothing?: A Lot Help from another person to put on and taking off regular lower body clothing?: A Lot 6 Click Score: 11   End of Session Equipment Utilized During Treatment: Gait belt Nurse Communication: Mobility status;Need for lift equipment  Activity Tolerance: Patient tolerated treatment well Patient left: in chair;with call bell/phone within  reach;with chair alarm set;with family/visitor present  OT Visit Diagnosis: Unsteadiness on feet (R26.81);Muscle weakness (generalized) (M62.81);History of falling (Z91.81);Other symptoms and signs involving cognitive function;Hemiplegia and hemiparesis Hemiplegia - Right/Left: Left Hemiplegia - dominant/non-dominant: Non-Dominant Hemiplegia - caused by: Nontraumatic intracerebral hemorrhage                Time: [id number] OT Time Calculation (min): 26 min Charges:  OT General Charges $OT Visit: 1 Visit OT Evaluation $OT Eval Moderate Complexity: 1 Mod  07/07/2021  RP, OTR/L  Acute Rehabilitation Services  Office:  [phone]   Suzanna ObeyRichard D Jacqualine Weichel 07/07/2021, 2:10 PM

## 2021-07-07 NOTE — Evaluation (Signed)
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   Paroxysmal A-fib ([place])    Left lower lobe pneumonia 03/15/2020   Atrial fibrillation with rapid ventricular response ([place])    Acute delirium 03/11/2020   Leukocytosis 03/11/2020   Closed right hip fracture ([place]) 03/09/2020   Essential hypertension 03/09/2020   Hypokalemia 03/09/2020   Hematochezia    Rectal bleeding    GI bleed 03/05/2018   Encounter for long-term (current) use of other medications 09/25/2017   Chronic diarrhea 09/11/2017   CAD in native artery 11/08/2015   Other long term (current) drug therapy 11/08/2015   Elevated fasting blood sugar 11/08/2015   Generalized anxiety disorder 12/07/2014   Back ache 02/14/2014   Scoliosis 05/27/2012   BP (high blood pressure) 11/20/2011   HLD (hyperlipidemia) 11/20/2011   Past Medical History:  Past Medical History:  Diagnosis Date   Anxiety    Depression    Essential hypertension    Hyperlipidemia    PAF (paroxysmal atrial fibrillation) ([place])    a. 02/2020 Post-op Afib after R Femur FX and IM nail (also hypokalemic)-->Amio/Eliquis (CHA2DS2VASc = 4).   Past Surgical History:  Past Surgical History:  Procedure Laterality Date   ABDOMINAL HYSTERECTOMY     BYPASS AXILLA/BRACHIAL ARTERY     CHOLECYSTECTOMY     COLONOSCOPY WITH PROPOFOL N/A 03/07/2018   Procedure: COLONOSCOPY WITH PROPOFOL;  Surgeon: Lucilla Lame, MD;  Location: [facility] ENDOSCOPY;  Service: Endoscopy;  Laterality: N/A;   INTRAMEDULLARY (IM) NAIL INTERTROCHANTERIC Right 03/10/2020   Procedure: INTRAMEDULLARY (IM) NAIL INTERTROCHANTRIC;  Surgeon: Thornton Park, MD;  Location: [facility] ORS;  Service: Orthopedics;  Laterality: Right;   HPI:  Pt is a 81 y.o. female  who presented to the ED with confusion and left-sided weakness CT head 1/18: acute intraparenchymal hemorrhage centered at  the right thalamus (estimated volume 4 mL), surrounding vasogenic  edema. PMH: atrial fibrillation, HTN.   Assessment / Plan / Recommendation Clinical Impression  Pt participated in speech/language/cognition evaluation with her daughter present. Pt's daughter reported baseline cognitive impairments at least related to memory. Per the daughter, pt's cognitive difficulty has been recently exacerbated by the pt's withdrawal from cognitively-stimulating/social activities at the ALF. [facility] Mental Status Examination was completed to evaluate the pt's cognitive-linguistic skills. Motor speech and language skills were WNL. She achieved a score of 12/30 which is below the normal limits of 27 or more out of 30. She exhibited difficulty in the areas of temporal orientation, memory, and executive function. However, following testing, pt and her daughter indicated that they believed the pt's performance is at baseline. Pt's daughter reported that the pt has been receiving adequate support at the ALF and from family, and that these impairments have therefore not impacted safety. Further acute skilled SLP services are not clinically indicated at this time. Pt, family, and nursing were educated regarding results and recommendations; all parties verbalized understanding as well as agreement with plan of care.    SLP Assessment  SLP Recommendation/Assessment: Patient does not need any further Speech Harmon Pathology Services SLP Visit Diagnosis: Cognitive communication deficit (R41.841)    Recommendations for follow up therapy are one component of a multi-disciplinary discharge planning process, led by the attending physician.  Recommendations may be updated based on patient  status, additional functional criteria and insurance authorization.    Follow Up Recommendations  No  SLP follow up    Assistance Recommended at Discharge     Functional Status Assessment Patient has not had a recent decline in their functional status  Frequency and Duration           SLP Evaluation Cognition  Overall Cognitive Status: History of cognitive impairments - at baseline Arousal/Alertness: Awake/alert Orientation Level: Oriented to person;Oriented to place;Oriented to situation;Disoriented to time Year:  (2030) Month: January Day of Week: Incorrect Attention: Focused;Sustained Focused Attention: Appears intact Sustained Attention: Appears intact Memory: Impaired Memory Impairment: Decreased recall of new information;Decreased short term memory;Retrieval deficit (Immediate: 5/5 with multiple repetitions; delayed: 1/5; 4/4 with cues) Awareness: Appears intact Problem Solving: Appears intact Executive Function: Sequencing;Organizing Sequencing: Impaired Sequencing Impairment: Verbal complex (clock drawing: 2/4; pt stated she did not know what to do after inserting numbers) Organizing: Impaired Organizing Impairment: Verbal complex (backward digit span: 0/2)       Comprehension  Auditory Comprehension Overall Auditory Comprehension: Appears within functional limits for tasks assessed Yes/No Questions: Within Functional Limits Commands: Within Functional Limits Conversation: Complex    Expression Verbal Expression Overall Verbal Expression: Appears within functional limits for tasks assessed Initiation: No impairment Naming: No impairment Pragmatics: No impairment   Oral / Motor  Oral Motor/Sensory Function Overall Oral Motor/Sensory Function: Within functional limits Motor Speech Overall Motor Speech: Appears within functional limits for tasks assessed Respiration: Within functional limits Phonation: Normal Resonance: Within functional limits Articulation: Within functional limitis Intelligibility: Intelligible Motor Planning: Witnin functional limits            Sarp Vernier I. Hardin Negus, [place], [place] Office number [phone] Pager [place] 07/07/2021, 10:54 AM

## 2021-07-07 NOTE — Progress Notes (Addendum)
STROKE TEAM PROGRESS NOTE   INTERVAL HISTORY Her daughter is at the bedside.  She is from an assisted living facility that does offer PT/OT. Discuss course of care and ASPIRE trial with daughters.  CT angiogram last night shows unchanged appearance of the right thalamic hemorrhage with mild surrounding edema and no significant regional mass-effect.  No large vessel intracranial stenosis or occlusion or AVM was noted..  Neurological exam is stable as is repeat head CT last night. Adjust abx and fluids r/t kidney function. Adjust klonopin to optimize sleep/wake cycle.  Blood pressure adequately controlled.  Echocardiogram was normal Vitals:   07/07/21 0800 07/07/21 0900 07/07/21 1000 07/07/21 1100  BP: (!) 150/92 (!) 162/84 125/83 (!) 148/68  Pulse: (!) 57 (!) 58 61 (!) 52  Resp: 18 19 (!) 23 19  Temp: 97.8 F (36.6 C)     TempSrc: Axillary     SpO2: 97% 96% 93% 93%   CBC:  Recent Labs  Lab 07/06/21 0406 07/07/21 0340  WBC 12.0* 8.1  NEUTROABS 9.1*  --   HGB 12.5 11.7*  HCT 39.5 36.8  MCV 94.3 92.9  PLT 221 [id number]    Basic Metabolic Panel:  Recent Labs  Lab 07/06/21 0406 07/07/21 0340  NA 137 138  K 3.9 3.5  CL 103 103  CO2 27 24  GLUCOSE 104* 117*  BUN 23 22  CREATININE 0.70 1.04*  CALCIUM 8.8* 8.8*    Lipid Panel:  Recent Labs  Lab 07/07/21 0340  CHOL 178  TRIG 155*  HDL 44  CHOLHDL 4.0  VLDL 31  LDLCALC 103*   HgbA1c:  Recent Labs  Lab 07/07/21 0340  HGBA1C 5.5   Urine Drug Screen:  Recent Labs  Lab 07/06/21 0440  LABOPIA NONE DETECTED  COCAINSCRNUR NONE DETECTED  LABBENZ NONE DETECTED  AMPHETMU NONE DETECTED  THCU NONE DETECTED  LABBARB NONE DETECTED     Alcohol Level  Recent Labs  Lab 07/06/21 0406  ETH <10     IMAGING past 24 hours CT ANGIO HEAD NECK W WO CM  Result Date: 07/06/2021 CLINICAL DATA:  Neuro deficit, stroke suspected EXAM: CT ANGIOGRAPHY HEAD AND NECK TECHNIQUE: Multidetector CT imaging of the head and neck was  performed using the standard protocol during bolus administration of intravenous contrast. Multiplanar CT image reconstructions and MIPs were obtained to evaluate the vascular anatomy. Carotid stenosis measurements (when applicable) are obtained utilizing NASCET criteria, using the distal internal carotid diameter as the denominator. RADIATION DOSE REDUCTION: This exam was performed according to the departmental dose-optimization program which includes automated exposure control, adjustment of the mA and/or kV according to patient size and/or use of iterative reconstruction technique. CONTRAST:  54mL OMNIPAQUE IOHEXOL 350 MG/ML SOLN COMPARISON:  No prior CTA, correlation is made with 07/06/2021 CT FINDINGS: CT HEAD FINDINGS Evaluation is somewhat limited by motion artifact. Brain: Redemonstrated acute parenchymal hemorrhage measuring 1.8 x 1.5 x 3.2 cm, centered in the right thalamus, unchanged from the prior exam. There is mild surrounding vasogenic edema without significant regional mass effect. No evidence of intraventricular extension. No new acute hemorrhage. No acute infarct, mass, mass effect, or midline shift. No hydrocephalus or extra-axial collection. Vascular: Please see CTA findings below Skull: Previously noted right temporal scalp lesion is not well seen on the current exam. No acute osseous abnormality. Hyperostosis frontalis. Sinuses: Imaged portions are clear. Orbits: Status post bilateral lens replacements. Review of the MIP images confirms the above findings CTA NECK FINDINGS Aortic arch: Standard branching.  Imaged portion shows no evidence of aneurysm or dissection. No significant stenosis of the major arch vessel origins. Aortic atherosclerosis. Calcifications are noted in the proximal left subclavian artery, which do not appear hemodynamically significant. Right carotid system: No evidence of dissection, stenosis (50% or greater) or occlusion. Left carotid system: No evidence of dissection,  stenosis (50% or greater) or occlusion. Tortuosity, with extreme lateral course of the left internal carotid artery (series 10, image 188). Vertebral arteries: No evidence of dissection, stenosis (50% or greater) or occlusion. Skeleton: Degenerative changes in the cervical spine, worst at C5-C6 and C6-C7. No acute osseous abnormality. Status post median sternotomy. Other neck: Negative. Upper chest: No focal pulmonary opacity or pleural effusion. Review of the MIP images confirms the above findings CTA HEAD FINDINGS Anterior circulation: Both internal carotid arteries are patent to the termini, with moderate circumferential calcifications, which cause mild stenosis. A1 segments patent. Normal anterior communicating artery. Anterior cerebral arteries are patent to their distal aspects. No M1 stenosis or occlusion. Normal MCA bifurcations. Distal MCA branches perfused and symmetric. No evidence of active extravasation into the right thalamic hemorrhage. Posterior circulation: Vertebral arteries patent to the vertebrobasilar junction without stenosis. Posterior inferior cerebral arteries patent bilaterally. Basilar patent to its distal aspect. Superior cerebellar arteries patent bilaterally. Bilateral P1 segments originate from the basilar artery. PCAs perfused to their distal aspects without stenosis. The bilateral posterior communicating arteries are not visualized. Venous sinuses: As permitted by contrast timing, patent. Anatomic variants: None significant Review of the MIP images confirms the above findings IMPRESSION: 1. Unchanged appearance of the previously noted acute intraparenchymal hemorrhage in the right thalamus, with unchanged surrounding vasogenic edema but without significant regional mass effect. 2.  No hemodynamically significant stenosis in the neck. 3. No intracranial large vessel occlusion or significant stenosis. No evidence of active extravasation into the right thalamic hemorrhage.  Electronically Signed   By: Merilyn Baba M.D.   On: 07/06/2021 19:37    PHYSICAL EXAM  Physical Exam  Constitutional: Appears well-developed and well-nourished pleasant elderly Caucasian lady.  Psych: Affect appropriate to situation Cardiovascular: Normal rate and regular rhythm.  Respiratory: Effort normal, non-labored breathing, 2 L nasal cannula  Neuro: Mental Status: Patient is awake, alert, oriented to person, place, month, year, and situation.  Mild dysarthria noted.  Patient is able to give a clear and coherent history. Cranial Nerves: II: Pupils are equal, round, and reactive to light.  Poor vision at baseline III,IV, VI: EOMI without ptosis or diploplia.  V: Facial sensation is symmetric to temperature VII: Left facial droop VIII: Hearing is intact to voice X: Palate elevates symmetrically XI: Shoulder shrug is symmetric. XII: Tongue protrudes midline without atrophy or fasciculations.  Motor: Tone is normal. Bulk is decreased. Significant drift noted in left upper and lower extremity.  Hand grasp weak on left side. RUE 5/5 LUE 2/5 diminished on the left RLE 5/5  LLE 1/5 Sensory: Decreased sensation in left arm and left leg Plantars: Toes are downgoing bilaterally.  Coordination: Weakness on the left, tremor noted on the right with FTN   ASSESSMENT/PLAN [name] is a 81 y.o. female with history of atrial fibrillation who takes Eliquis presenting with  left-sided weakness and confusion.  Her BP was 158 SBP on arrival. Head CT revealed a thalamic IPH and she has therefore been transferred to [facility] for further management.  WBC 12.0, all other labs unremarkable.  Mild UTI, Macrobid started.  Cleviprex infusing for blood pressure control, PRNs ordered  and home meds restarted for hypertension.  MRI pending, echo pending.   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 Underlying atrophy with chronic small vessel ischemic disease. Multifocal soft tissue contusions about the scalp vertex. 1.6 cm nodular soft tissue lesion at the right temporal scalp.  MRI - Pending   MRA  Pending 2D Echo results pending LDL 48 HgbA1c 5.3 VTE prophylaxis -SCDs Eliquis (apixaban) daily prior to admission, now on No antithrombotic.  Hold anticoagulation until it is appropriate to restart Therapy recommendations: Pending Disposition: Pending  Atrial fibrillation Home medication: Eliquis 5 mg twice daily, amiodarone 200 mg daily Currently rate controlled  Hypertension Home meds: Lopressor 12.5 mg, resumed Stable Keep blood pressure less than systolic 0000000 Cleviprex IV infusion-titrate down as able Utilize as needed medications: Labetalol, hydralazine  Other Stroke Risk Factors Advanced Age >/= 40  Family hx stroke (Mother, Sister)  Other Active Problems Anxiety/Depression Klonopin 0.5 mg 4 times a day, Lexapro 5 mg daily Klonopin restarted BID with PRN dose Ultram resumed q6prn  Mild UTI Macrobid 100 mg q12 ordered- > switch to ceftriaxone 1g Reporting urinary frequency Urine culture sent Urinalysis showed moderate Hgb, moderate leukocytes  Hospital day # 1  Patient seen and examined by NP/APP with MD. MD to update note as needed.   Janine Ores, DNP, FNP-BC [facility] Pager: [phone]  I have personally obtained history,examined this patient, reviewed notes, independently viewed imaging studies, participated in medical decision making and plan of care.ROS completed by me personally and pertinent positives fully documented  I have made any additions or clarifications directly to the above note. Agree with note above.  Continue strict blood pressure control with systolic goal below 0000000.  Mobilize out of bed.  Ongoing therapy consults.  Transfer out of ICU to  neurology floor bed.  Medical hospitalist team consult to take over care.  Hopefully transfer to inpatient rehab in a few days after insurance approval and when bed available.  Family considering possible participation in the ASPIRE trial (Eliquis versus aspirin for A. fib after intracerebral hemorrhage) .This patient is critically ill and at significant risk of neurological worsening, death and care requires constant monitoring of vital signs, hemodynamics,respiratory and cardiac monitoring, extensive review of multiple databases, frequent neurological assessment, discussion with family, other specialists and medical decision making of high complexity.I have made any additions or clarifications directly to the above note.This critical care time does not reflect procedure time, or teaching time or supervisory time of PA/NP/Med Resident etc but could involve care discussion time.  I spent 30 minutes of neurocritical care time  in the care of  this patient.         Antony Contras, MD Medical Director Center Pager: [phone] 07/07/2021 2:02 PM   To contact Stroke Continuity provider, please refer to [email or web address] After hours, contact General Neurology

## 2021-07-08 ENCOUNTER — Inpatient Hospital Stay (HOSPITAL_COMMUNITY): Payer: Medicare Other

## 2021-07-08 DIAGNOSIS — R0902 Hypoxemia: Secondary | ICD-10-CM

## 2021-07-08 DIAGNOSIS — N390 Urinary tract infection, site not specified: Secondary | ICD-10-CM | POA: Diagnosis present

## 2021-07-08 DIAGNOSIS — I10 Essential (primary) hypertension: Secondary | ICD-10-CM

## 2021-07-08 DIAGNOSIS — I629 Nontraumatic intracranial hemorrhage, unspecified: Secondary | ICD-10-CM

## 2021-07-08 DIAGNOSIS — I480 Paroxysmal atrial fibrillation: Secondary | ICD-10-CM

## 2021-07-08 DIAGNOSIS — N3000 Acute cystitis without hematuria: Secondary | ICD-10-CM

## 2021-07-08 LAB — URINE CULTURE: Culture: >=100000 — AB

## 2021-07-08 LAB — BASIC METABOLIC PANEL
Anion gap: 10 (ref 5–15)
BUN: 18 mg/dL (ref 8–23)
CO2: 24 mmol/L (ref 22–32)
Calcium: 8.9 mg/dL (ref 8.9–10.3)
Chloride: 104 mmol/L (ref 98–111)
Creatinine, Ser: 0.88 mg/dL (ref 0.44–1.00)
GFR, Estimated: >60 mL/min (ref >60)
Glucose, Bld: 100 mg/dL — ABNORMAL HIGH (ref 70–99)
Potassium: 3.6 mmol/L (ref 3.5–5.1)
Sodium: 138 mmol/L (ref 135–145)

## 2021-07-08 MED ORDER — SENNOSIDES-DOCUSATE SODIUM 8.6-50 MG PO TABS
1.0000 | ORAL_TABLET | Freq: Every day | ORAL | Status: DC | PRN
  Administered 2021-07-11: 1 via ORAL
  Filled 2021-07-08: qty 1

## 2021-07-08 MED ORDER — POTASSIUM CHLORIDE CRYS ER 20 MEQ PO TBCR
40.0000 meq | EXTENDED_RELEASE_TABLET | Freq: Once | ORAL | Status: AC
  Administered 2021-07-08: 40 meq via ORAL
  Filled 2021-07-08: qty 2

## 2021-07-08 MED ORDER — IOHEXOL 350 MG/ML SOLN
50.0000 mL | Freq: Once | INTRAVENOUS | Status: AC | PRN
  Administered 2021-07-08: 50 mL via INTRAVENOUS

## 2021-07-08 MED ORDER — ENOXAPARIN SODIUM 40 MG/0.4ML IJ SOSY
40.0000 mg | PREFILLED_SYRINGE | Freq: Every day | INTRAMUSCULAR | Status: DC
  Administered 2021-07-08 – 2021-07-15 (×8): 40 mg via SUBCUTANEOUS
  Filled 2021-07-08 (×8): qty 0.4

## 2021-07-08 MED ORDER — ESCITALOPRAM OXALATE 10 MG PO TABS
5.0000 mg | ORAL_TABLET | Freq: Every morning | ORAL | Status: DC
  Administered 2021-07-08 – 2021-07-15 (×8): 5 mg via ORAL
  Filled 2021-07-08 (×8): qty 1

## 2021-07-08 MED ORDER — METOPROLOL TARTRATE 25 MG PO TABS
25.0000 mg | ORAL_TABLET | Freq: Two times a day (BID) | ORAL | Status: DC
  Administered 2021-07-08 – 2021-07-15 (×15): 25 mg via ORAL
  Filled 2021-07-08 (×15): qty 1

## 2021-07-08 MED ORDER — FUROSEMIDE 10 MG/ML IJ SOLN
20.0000 mg | Freq: Once | INTRAMUSCULAR | Status: AC
  Administered 2021-07-08: 20 mg via INTRAVENOUS
  Filled 2021-07-08: qty 2

## 2021-07-08 NOTE — Assessment & Plan Note (Addendum)
Noted to be on amiodarone, metoprolol and Eliquis prior to admission. Eliquis on hold due to intraparenchymal hemorrhage.   Remains on metoprolol and amiodarone.  Monitor on telemetry.

## 2021-07-08 NOTE — Progress Notes (Signed)
Occupational Therapy Treatment Patient Details Name: Michelle Bauer MRN: [id number] DOB: [birth year] Today's Date: 07/08/2021   History of present illness 81 y/o female presented to [facility] ED on 07/06/21 following fall hitting head at ALF and after EMS arrived, patient with slurred speech and L sided weakness with subsequent fall out of wheelchair with EMS present. 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 PMH: Afib, HTN, anxiety, scoliosis   OT comments  Patient with incremental progress toward patient focused goals.  Patient continues with poor balance and inattention to L.  Patient able to sit [facility] for brief moments, and is standing with less assist.  Able to transfer times 3 to/from [facility] and to the recliner.  Max A for transfers.  OT will continue to follow in the acute setting.  LUE with increase swelling to hand.  Elevated on pillows.  SNF continues to be recommended.     Recommendations for follow up therapy are one component of a multi-disciplinary discharge planning process, led by the attending physician.  Recommendations may be updated based on patient status, additional functional criteria and insurance authorization.    Follow Up Recommendations  Skilled nursing-short term rehab (<3 hours/day)    Assistance Recommended at Discharge    Patient can return home with the following  Two people to help with walking and/or transfers;Two people to help with bathing/dressing/bathroom;Direct supervision/assist for financial management;Assist for transportation;Assistance with cooking/housework;Assistance with feeding;Help with stairs or ramp for entrance;Direct supervision/assist for medications management   Equipment Recommendations       Recommendations for Other Services      Precautions / Restrictions Precautions Precautions: Fall Precaution Comments: SBP <160, hx of severe scoliosis, pusher, L inattention Restrictions Weight Bearing Restrictions: No       Mobility Bed  Mobility Overal bed mobility: Needs Assistance Bed Mobility: Supine to Sit     Supine to sit: Mod assist, +2 for physical assistance          Transfers     Transfers: Sit to/from Stand, Bed to chair/wheelchair/BSC Sit to Stand: Mod assist Stand pivot transfers: Max assist               Balance Overall balance assessment: Needs assistance Sitting-balance support: No upper extremity supported, Feet supported Sitting balance-Leahy Scale: Poor Sitting balance - Comments: pushing towards L side, but able to sit closer to midline with Min Guard at times Postural control: Left lateral lean, Posterior lean Standing balance support: Bilateral upper extremity supported Standing balance-Leahy Scale: Zero                             ADL either performed or assessed with clinical judgement   ADL                           Toilet Transfer: Maximal assistance;Stand-pivot;BSC/3in1   Toileting- Clothing Manipulation and Hygiene: Total assistance;+2 for physical assistance              Extremity/Trunk Assessment Upper Extremity Assessment LUE Deficits / Details: trace AROM LUE Sensation: decreased light touch LUE Coordination: decreased fine motor;decreased gross motor   Lower Extremity Assessment Lower Extremity Assessment: Defer to PT evaluation   Cervical / Trunk Assessment Cervical / Trunk Exceptions: scoliosis at baseline - R convexity    Vision Baseline Vision/History: 1 Wears glasses Patient Visual Report: No change from baseline Convergence: Within functional limits   Perception Perception Perception:  Impaired Inattention/Neglect: Does not attend to left visual field;Does not attend to left side of body   Praxis Praxis Praxis: Intact    Cognition Arousal/Alertness: Awake/alert Behavior During Therapy: WFL for tasks assessed/performed Overall Cognitive Status: History of cognitive impairments - at baseline                                                              Pertinent Vitals/ Pain       Pain Assessment Pain Assessment: Faces Faces Pain Scale: No hurt                                                          Frequency  Min 2X/week        Progress Toward Goals  OT Goals(current goals can now be found in the care plan section)  Progress towards OT goals: Progressing toward goals  Acute Rehab OT Goals OT Goal Formulation: With patient Time For Goal Achievement: 07/21/21 Potential to Achieve Goals: Fair  Plan Discharge plan remains appropriate    Co-evaluation    PT/OT/SLP Co-Evaluation/Treatment: Yes Reason for Co-Treatment: Complexity of the patient's impairments (multi-system involvement);Necessary to address cognition/behavior during functional activity;To address functional/ADL transfers;For patient/therapist safety   OT goals addressed during session: ADL's and self-care      AM-PAC OT "6 Clicks" Daily Activity     Outcome Measure   Help from another person eating meals?: A Lot Help from another person taking care of personal grooming?: A Lot Help from another person toileting, which includes using toliet, bedpan, or urinal?: A Lot Help from another person bathing (including washing, rinsing, drying)?: A Lot Help from another person to put on and taking off regular upper body clothing?: A Lot Help from another person to put on and taking off regular lower body clothing?: A Lot 6 Click Score: 12    End of Session Equipment Utilized During Treatment: Gait belt  OT Visit Diagnosis: Unsteadiness on feet (R26.81);Muscle weakness (generalized) (M62.81);History of falling (Z91.81);Other symptoms and signs involving cognitive function;Hemiplegia and hemiparesis Hemiplegia - Right/Left: Left Hemiplegia - dominant/non-dominant: Non-Dominant Hemiplegia - caused by: Nontraumatic intracerebral hemorrhage   Activity Tolerance Patient tolerated  treatment well   Patient Left in chair;with call bell/phone within reach;with chair alarm set;with family/visitor present   Nurse Communication Need for lift equipment        Time: 1421-1446 OT Time Calculation (min): 25 min  Charges: OT General Charges $OT Visit: 1 Visit OT Treatments $Self Care/Home Management : 8-22 mins  07/08/2021  RP, OTR/L  Acute Rehabilitation Services  Office:  [phone]   Suzanna Obey 07/08/2021, 2:53 PM

## 2021-07-08 NOTE — Assessment & Plan Note (Addendum)
Urine culture growing E. coli sensitive to ceftriaxone and cefazolin.  She was transitioned to Keflex.  She has completed a 5-day course.

## 2021-07-08 NOTE — Progress Notes (Signed)
Physical Therapy Treatment Patient Details Name: Michelle Bauer MRN: [id number] DOB: [birth year] Today's Date: 07/08/2021   History of Present Illness 81 y/o female presented to [facility] ED on 07/06/21 following fall hitting head at ALF and after EMS arrived, patient with slurred speech and L sided weakness with subsequent fall out of wheelchair with EMS present. 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 PMH: Afib, HTN, anxiety, scoliosis    PT Comments    Patient able to tolerate stand pivot transfer x 3 this date to recliner and BSC. Patient required maxA+2 for transfers and totalA for pericare following bowel movement. Patient continues to demonstrate L inattention and pushing towards L with R UE. Posture is limited by severe scoliotic curve with convexity to R. Continue to recommend SNF for ongoing Physical Therapy.      Recommendations for follow up therapy are one component of a multi-disciplinary discharge planning process, led by the attending physician.  Recommendations may be updated based on patient status, additional functional criteria and insurance authorization.  Follow Up Recommendations  Skilled nursing-short term rehab (<3 hours/day)     Assistance Recommended at Discharge Frequent or constant Supervision/Assistance  Patient can return home with the following Two people to help with walking and/or transfers;Two people to help with bathing/dressing/bathroom;Assistance with cooking/housework;Assistance with feeding;Direct supervision/assist for financial management;Direct supervision/assist for medications management;Assist for transportation   Equipment Recommendations  Other (comment) (TBD)    Recommendations for Other Services       Precautions / Restrictions Precautions Precautions: Fall Precaution Comments: SBP <160, hx of severe scoliosis, pusher, L inattention Restrictions Weight Bearing Restrictions: No     Mobility  Bed Mobility Overal bed mobility: Needs  Assistance Bed Mobility: Supine to Sit     Supine to sit: Mod assist, +2 for physical assistance     General bed mobility comments: assist for L LE and trunk elevation    Transfers Overall transfer level: Needs assistance Equipment used: 2 person hand held assist Transfers: Sit to/from Stand, Bed to chair/wheelchair/BSC Sit to Stand: Mod assist, +2 safety/equipment Stand pivot transfers: Max assist, +2 physical assistance, +2 safety/equipment         General transfer comment: maxA+2 to transfer to bed>recliner<>BSC.    Ambulation/Gait                   Stairs             Wheelchair Mobility    Modified Rankin (Stroke Patients Only) Modified Rankin (Stroke Patients Only) Pre-Morbid Rankin Score: Moderately severe disability Modified Rankin: Severe disability     Balance Overall balance assessment: Needs assistance Sitting-balance support: No upper extremity supported, Feet supported Sitting balance-Leahy Scale: Poor Sitting balance - Comments: pushing towards L side, but able to sit closer to midline with Min Guard at times Postural control: Left lateral lean, Posterior lean Standing balance support: Bilateral upper extremity supported Standing balance-Leahy Scale: Zero Standing balance comment: maxA+2 to maintain standing                            Cognition Arousal/Alertness: Awake/alert Behavior During Therapy: WFL for tasks assessed/performed Overall Cognitive Status: History of cognitive impairments - at baseline                                          Exercises  General Comments General comments (skin integrity, edema, etc.): on 5L O2 [place] with VSS      Pertinent Vitals/Pain Pain Assessment Pain Assessment: Faces Faces Pain Scale: No hurt Pain Intervention(s): Monitored during session, Repositioned    Home Living                          Prior Function            PT Goals (current  goals can now be found in the care plan section) Acute Rehab PT Goals Patient Stated Goal: did not state PT Goal Formulation: With patient/family Time For Goal Achievement: 07/21/21 Potential to Achieve Goals: Fair Progress towards PT goals: Progressing toward goals    Frequency    Min 3X/week      PT Plan Current plan remains appropriate    Co-evaluation PT/OT/SLP Co-Evaluation/Treatment: Yes Reason for Co-Treatment: To address functional/ADL transfers;Complexity of the patient's impairments (multi-system involvement);Necessary to address cognition/behavior during functional activity;For patient/therapist safety PT goals addressed during session: Mobility/safety with mobility;Balance OT goals addressed during session: ADL's and self-care      AM-PAC PT "6 Clicks" Mobility   Outcome Measure  Help needed turning from your back to your side while in a flat bed without using bedrails?: Total Help needed moving from lying on your back to sitting on the side of a flat bed without using bedrails?: Total Help needed moving to and from a bed to a chair (including a wheelchair)?: Total Help needed standing up from a chair using your arms (e.g., wheelchair or bedside chair)?: Total Help needed to walk in hospital room?: Total Help needed climbing 3-5 steps with a railing? : Total 6 Click Score: 6    End of Session Equipment Utilized During Treatment: Gait belt;Oxygen Activity Tolerance: Patient tolerated treatment well Patient left: in chair;with call bell/phone within reach;with chair alarm set Nurse Communication: Mobility status PT Visit Diagnosis: Unsteadiness on feet (R26.81);Muscle weakness (generalized) (M62.81);Other abnormalities of gait and mobility (R26.89);History of falling (Z91.81);Other symptoms and signs involving the nervous system [id number])     Time: [id number] PT Time Calculation (min) (ACUTE ONLY): 33 min  Charges:  $Therapeutic Activity: 8-22 mins                      Shavawn Stobaugh A. Gilford Rile PT, DPT Acute Rehabilitation Services Pager [phone] Office [phone]    Linna Hoff 07/08/2021, 3:04 PM

## 2021-07-08 NOTE — Progress Notes (Addendum)
STROKE TEAM PROGRESS NOTE   INTERVAL HISTORY Her daughter is at the bedside.  Overnight, patient developed hypoxia requiring increasing oxygen and hence was sent for CT chest which revealed no PE.  She has been hemodynamically stable and her neurological exam is stable. Vitals:   07/08/21 1100 07/08/21 1200 07/08/21 1220 07/08/21 1300  BP: (!) 146/64 138/65  (!) 121/55  Pulse: 75 77 78 86  Resp: (!) 23 (!) 31 20 (!) 21  Temp:  99.2 F (37.3 C)    TempSrc:  Axillary    SpO2: (!) 89% (!) 87% 95% 92%   CBC:  Recent Labs  Lab 07/06/21 0406 07/07/21 0340  WBC 12.0* 8.1  NEUTROABS 9.1*  --   HGB 12.5 11.7*  HCT 39.5 36.8  MCV 94.3 92.9  PLT 221 [id number]    Basic Metabolic Panel:  Recent Labs  Lab 07/07/21 0340 07/08/21 0802  NA 138 138  K 3.5 3.6  CL 103 104  CO2 24 24  GLUCOSE 117* 100*  BUN 22 18  CREATININE 1.04* 0.88  CALCIUM 8.8* 8.9    Lipid Panel:  Recent Labs  Lab 07/07/21 0340  CHOL 178  TRIG 155*  HDL 44  CHOLHDL 4.0  VLDL 31  LDLCALC 103*    HgbA1c:  Recent Labs  Lab 07/07/21 0340  HGBA1C 5.5    Urine Drug Screen:  Recent Labs  Lab 07/06/21 0440  LABOPIA NONE DETECTED  COCAINSCRNUR NONE DETECTED  LABBENZ NONE DETECTED  AMPHETMU NONE DETECTED  THCU NONE DETECTED  LABBARB NONE DETECTED     Alcohol Level  Recent Labs  Lab 07/06/21 0406  ETH <10     IMAGING past 24 hours CT Angio Chest Pulmonary Embolism (PE) W or WO Contrast  Result Date: 07/08/2021 CLINICAL DATA:  Possible pleural effusion on chest x-ray, concern for PE EXAM: CT ANGIOGRAPHY CHEST WITH CONTRAST TECHNIQUE: Multidetector CT imaging of the chest was performed using the standard protocol during bolus administration of intravenous contrast. Multiplanar CT image reconstructions and MIPs were obtained to evaluate the vascular anatomy. RADIATION DOSE REDUCTION: This exam was performed according to the departmental dose-optimization program which includes automated exposure  control, adjustment of the mA and/or kV according to patient size and/or use of iterative reconstruction technique. CONTRAST:  7mL OMNIPAQUE IOHEXOL 350 MG/ML SOLN COMPARISON:  Same-day chest radiograph FINDINGS: Cardiovascular: Is adequate opacification of the pulmonary arteries to the segmental level. There is no evidence of pulmonary embolism. Heart is enlarged. There is no pericardial effusion. The patient is status post CABG. There is scattered calcification throughout the native coronary arteries. There is calcified atherosclerotic plaque in the thoracic aorta.The aortic arch is borderline enlarged at 3.0 cm. Mediastinum/Nodes: The thyroid is unremarkable. Esophagus is grossly unremarkable. There is no mediastinal, hilar, or axillary lymphadenopathy. Lungs/Pleura: The trachea and central airways are patent. There is consolidative opacity in the posterior segment of the left upper lobe. There are additional linear opacities in the bilateral lung bases likely reflecting atelectasis. There is additional subsegmental atelectasis in the medial right lower lobe along the scoliotic spine. There is no other focal consolidation. There is no pulmonary edema. There is no pleural effusion or pneumothorax. Upper Abdomen: No acute findings. Musculoskeletal: There is marked dextrocurvature of the spine which distorts the thoracic cavity. There is no acute osseous abnormality or suspicious osseous lesion. Review of the MIP images confirms the above findings. IMPRESSION: 1. No evidence of pulmonary embolism. 2. Consolidative opacities in the left lower lobe  and lung bases most likely reflect atelectasis. No pleural effusion as was questioned on the prior radiograph. 3. Borderline enlarged aortic arch measuring 3.0 cm. Recommend annual imaging followup by CTA or MRA. This recommendation follows 2010 ACCF/AHA/AATS/ACR/ASA/SCA/SCAI/SIR/STS/SVM Guidelines for the Diagnosis and Management of Patients with Thoracic Aortic Disease.  Circulation.2010; [id number]. Aortic aneurysm NOS (ICD10-I71.9) Aortic Atherosclerosis (ICD10-I70.0). Electronically Signed   By: Valetta Mole M.D.   On: 07/08/2021 10:10   DG Chest Port 1 View  Result Date: 07/08/2021 CLINICAL DATA:  81 year old female with right thalamic hemorrhage. Hypoxia. EXAM: PORTABLE CHEST 1 VIEW COMPARISON:  Portable chest 03/16/2020 and earlier. FINDINGS: Portable AP semi upright view at 0849 hours. Moderate to severe scoliosis. Chronic hypo ventilation at the left lung base in part related to cardiomegaly and mediastinal lipomatosis, but evidence of pleural effusion on the left in 2021 which may have increased. No superimposed pneumothorax or pulmonary edema. Prior CABG. Stable cardiac size and mediastinal contours. Visualized tracheal air column is within normal limits. IMPRESSION: 1. Chronic hypoventilation at the left lung base, but with superimposed left pleural effusion in 2021 which may have increased. Recommend chest ultrasound or CT to confirm the presence of drainable fluid prior to any attempted thoracentesis. 2. Otherwise stable cardiomegaly, scoliosis. Electronically Signed   By: Genevie Ann M.D.   On: 07/08/2021 09:02    PHYSICAL EXAM  Physical Exam  Constitutional: Appears well-developed and well-nourished pleasant elderly Caucasian lady.  Psych: Affect appropriate to situation Cardiovascular: Normal rate and regular rhythm.  Respiratory: Effort normal, non-labored breathing, on venturi mask  Neuro: Mental Status: Patient is awake, alert, oriented to person, place, month, year, and situation.  Mild dysarthria noted.  Cranial Nerves: II: Pupils are equal, round, and reactive to light.  Poor vision at baseline III,IV, VI: EOMI without ptosis or diploplia.  V: Facial sensation is symmetric to temperature VII: Left facial droop VIII: Hearing is intact to voice XII: Tongue protrudes midline without atrophy or fasciculations.  Motor: RUE 5/5 LUE 0/5  diminished on the left RLE 5/5  LLE 2/5 Plantars: Toes are downgoing bilaterally.  Coordination: Weakness on the left, tremor noted on the right with FTN   ASSESSMENT/PLAN Ms. CAROLINE TWIDDY is a 81 y.o. female with history of atrial fibrillation who takes Eliquis presenting with  left-sided weakness and confusion.  Her BP was 158 SBP on arrival. Head CT revealed a thalamic IPH and she has therefore been transferred to [facility] for further management.  WBC 12.0, all other labs unremarkable.  Mild UTI, Macrobid started.  Cleviprex infusing for blood pressure control, PRNs ordered and home meds restarted for hypertension.  MRI pending.  She developed hypoxia last night was sent for CT of her chest, which revealed no PE.   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 Underlying atrophy with chronic small vessel ischemic disease. Multifocal soft tissue contusions about the scalp vertex. 1.6 cm nodular soft tissue lesion at the right temporal scalp.  MRI - Pending   MRA  Pending 2D Echo EF [id number], grade 1 diastolic dysfunction, no atrial level shunt LDL 48 HgbA1c 5.3 VTE prophylaxis -SCDs Eliquis (apixaban) daily prior to admission, now on No antithrombotic.  Hold anticoagulation until it is appropriate to restart Therapy recommendations: Pending Disposition: Pending  Atrial fibrillation Home medication: Eliquis 5 mg twice daily, amiodarone  200 mg daily Currently rate controlled  Hypertension Home meds: Lopressor 12.5 mg, resumed Stable Keep blood pressure less than systolic 0000000 Cleviprex IV infusion no longer needed  Other Stroke Risk Factors Advanced Age >/= 54  Family hx stroke (Mother, Sister)  Other Active Problems Anxiety/Depression Klonopin 0.5 mg 4 times a day, Lexapro 5 mg daily Klonopin restarted BID with  PRN dose Ultram resumed q6prn  Mild UTI Macrobid 100 mg q12 ordered- > switch to ceftriaxone 1g Reporting urinary frequency Urine culture sent Urinalysis showed moderate Hgb, moderate leukocytes  Hospital day # 2  Patient seen and examined by NP/APP with MD. MD to update note as needed.   G[place] , MSN, AGACNP-BC [facility] See Amion for schedule and pager information 07/08/2021 1:30 PM  I have personally obtained history,examined this patient, reviewed notes, independently viewed imaging studies, participated in medical decision making and plan of care.ROS completed by me personally and pertinent positives fully documented  I have made any additions or clarifications directly to the above note. Agree with note above.  Continue strict blood pressure control with systolic goal below 0000000.  Mobilize out of bed.  Ongoing therapies.  Start Lovenox for DVT prophylaxis.  Transfer to neurology floor bed when available.  Hopefully transfer to rehab early next week.  Discussed with patient and daughter and Dr. [place] Pink.  Patient's family is interested in participating in the ASPIRE study(Eliquis versus aspirin in patients with A. fib after intracerebral hemorrhage).  We will sign consent form today but patient will not be randomized until 2 weeks after her intracerebral hemorrhage as per study protocol.This patient is critically ill and at significant risk of neurological worsening, death and care requires constant monitoring of vital signs, hemodynamics,respiratory and cardiac monitoring, extensive review of multiple databases, frequent neurological assessment, discussion with family, other specialists and medical decision making of high complexity.I have made any additions or clarifications directly to the above note.This critical care time does not reflect procedure time, or teaching time or supervisory time of PA/NP/Med Resident etc but could involve care discussion time.  I  spent 30 minutes of neurocritical care time  in the care of  this patient.      Antony Contras, MD Medical Director [facility] Stroke Center Pager: [phone] 07/08/2021 3:49 PM  To contact Stroke Continuity provider, please refer to [email or web address] After hours, contact General Neurology

## 2021-07-08 NOTE — Assessment & Plan Note (Addendum)
Goal blood pressure should be less than 0000000 systolic per neurology.  Remains on metoprolol.  Blood pressure is reasonably well controlled.  Occasional high readings are noted.  Stable for the most part.

## 2021-07-08 NOTE — TOC Initial Note (Addendum)
Transition of Care [facility]) - Initial/Assessment Note    Patient Details  Name: Michelle Bauer MRN: [id number] Date of Birth: Apr 08, 1941  Transition of Care [facility]) CM/SW Contact:    Benard Halsted, LCSW Phone Number: 07/08/2021, 4:09 PM  Clinical Narrative:                 12pm-CSW received consult for possible SNF placement at time of discharge. CSW spoke with patient and her daughter [name] at bedside. Patient resides at [place]. Patient expressed understanding of PT recommendation and is hoping to return to ALF with increased care. Patient's daughter is agreeable to SNF placement at time of discharge if ALF is unable to meet her needs. Patient has received COVID vaccines. CSW will send out referrals for review in case ALF cannot accept patient back. Daughter is agreeable to SNF in [place] or [place].   CSW spoke with Roselyn Reef (business office as nurse is out today) at [place]. [phone] and explained her current mobility needs. She requested CSW fax over therapy notes for patient f. [phone] for her team to review. She stated if a hospital bed or oxygen are needed, they will just need an order and they have in house therapy three times a week through [facility]. CSW faxed therapy notes.   4:30pm-CSW received call back from [place] at [facility]. She reviewed patient's notes with therapy and they have determined that patient needs to go to SNF rehab for now. CSW left patient's daughter [name] a voicemail to make her aware. SNF referrals sent out and will present bed offers as available.   Skilled Nursing Rehab Facilities-   [email or web address]   Ratings out of 5 possible   Name Address  Phone # [place] Inspection Overall  [facility] [address] [place] 5 5 2 4   [facility]  [address] [place], [place] [phone] 4 2 5 5   [facility] [place],  [place] 4 1 1 1   [place], [place] 2 2 4 4   [facility] [address], [place] 2 1 2 1   [place] 3 1 4 3   [facility] [address] [place] 5 2 2 3   [facility] [address], [place] 4 1 2 1   [address] ([place]) [place], [place] [phone] 5 1 2 2   [facility] [phone], [place] [phone] 4 1 1 1   [facility] [address], [facility] [place] [phone] 4 1 2 1   [facility] ([place]) [place]. [place], [place] [phone] 4 1 1 1           [place] [address] [place] 4 2 3 3   [facility] [place] [address], [place] 3 1 5 4   [facility], [place] [address] [place] [address], [place] [phone] 2 1 1 1   [facility] [address], [place] [phone] 2 2 3 3           [address] (no [facility] [place] [address], [place] [phone] 4 5 5 5   [facility] (No Humana) [phone], [place] 4 1 4 3   [place] (No UHC) [place], [place] [address] 5 5 5 5   [place]  [address], [facility] [phone] 3 3 4 4   [place] [address], [place]  [phone] 2 2 2 2   [place] [address] [place] [phone] 3 1 3 2   [facility] [place] [address] [place] 1 1 2 1   [place] [address], [place] [id number] 2 1 1 1   [place], [place] 5 2 4 5   [facility] [address], [place] 2 1 1 1   [facility] [address], [place] 3 1 1 1   [facility] [place], [address] [phone] 2 1 2 1           [facility] [address], [address] [phone] 5 3 3 4   [place], [place] 2 1 1 1   [place] (No Humana) [address], [place] [phone] 2 1 2 1   [facility] [address] [place] [phone] 3 1 1 1           [facility] [place], [place] 4 1 5 4   [facility] [place], [place] 2 1 3 2   [facility] [place] [address] [place] 3 1 4 3   [facility] [address] [place] 4 1 4 3   [address] [place], [place] 3 3 1 1   [facility]) [place] [phone] 3 2 3 3        Barriers to Discharge: Continued Medical Work up, SNF Pending bed offer   Patient Goals and CMS Choice Patient states their goals for this hospitalization and ongoing recovery are:: Rehab CMS Medicare.gov Compare Post Acute Care list provided to:: Patient Represenative (must comment) Choice offered to / list presented to : Patient, Adult Children  Expected Discharge Plan and Services   In-house Referral: Clinical Social Work   Post Acute Care Choice: Resumption of Svcs/PTA Provider Living arrangements for the past 2 months: [place]                                      Prior Living Arrangements/Services Living arrangements for the past 2 months: [place] Lives with:: Facility Resident Patient language and need for interpreter reviewed:: Yes Do you feel safe going back to the place where you live?: Yes      Need for Family Participation in Patient Care: Yes (Comment) Care giver support system in place?: Yes (comment) Current home services: DME Criminal Activity/Legal Involvement Pertinent to Current Situation/Hospitalization: No - Comment as needed  Activities of Daily Living      Permission  Sought/Granted Permission sought to share information with : Facility [phone], Family Supports Permission granted to share information with : Yes, Verbal Permission Granted  Share Information with NAME: Kelly/Shannon  Permission granted to share info w AGENCY: ALF/SNFs  Permission granted to share info w Relationship: Daughters  Permission granted to share info w Contact Information: [phone]  Emotional Assessment Appearance:: Appears stated age Attitude/Demeanor/Rapport: Engaged Affect (typically observed): Accepting, Appropriate Orientation: : Oriented to Self, Oriented to Place, Oriented to  Time, Oriented to Situation Alcohol / Substance Use: Not Applicable Psych Involvement: No (comment)  Admission diagnosis:  Stroke (cerebrum) [facility]) [I63.9] Patient Active Problem List   Diagnosis Date Noted  Intracranial hemorrhage ([place]) 07/08/2021   Urinary tract infection 07/08/2021   Hypoxia 07/08/2021   Stroke (cerebrum) ([place]) 07/06/2021   Paroxysmal A-fib (HCC)    Left lower lobe pneumonia 03/15/2020   Atrial fibrillation with rapid ventricular response (HCC)    Acute delirium 03/11/2020   Leukocytosis 03/11/2020   Closed right hip fracture ([place]) 03/09/2020   Essential hypertension 03/09/2020   Hypokalemia 03/09/2020   Hematochezia    Rectal bleeding    GI bleed 03/05/2018   Encounter for long-term (current) use of other medications 09/25/2017   Chronic diarrhea 09/11/2017   CAD in native artery 11/08/2015   Other long term (current) drug therapy 11/08/2015   Elevated fasting blood sugar 11/08/2015   Generalized anxiety disorder 12/07/2014   Back ache 02/14/2014   Scoliosis 05/27/2012   BP (high blood pressure) 11/20/2011   HLD (hyperlipidemia) 11/20/2011   PCP:  Kirk Ruths, MD Pharmacy:  No Pharmacies Listed    Social Determinants of Health (SDOH) Interventions    Readmission Risk Interventions No flowsheet data found.

## 2021-07-08 NOTE — NC FL2 (Signed)
[place] LEVEL OF CARE SCREENING TOOL     IDENTIFICATION  Patient Name: Michelle Bauer Birthdate: [birth year] Sex: female Admission Date (Current Location): 07/06/2021  [facility] and [place] Number:  Engineering geologist and Address:  [facility], [place] [address] [place], [place] [address]      Provider Number: [id number]  Attending Physician Name and Address:  Bonnielee Haff, MD  Relative Name and Phone Number:       Current Level of Care: Hospital Recommended Level of Care: [place] Prior Approval Number:    Date Approved/Denied:   PASRR Number: [id number]  Discharge Plan: SNF    Current Diagnoses: Patient Active Problem List   Diagnosis Date Noted   Intracranial hemorrhage ([place]) 07/08/2021   Urinary tract infection 07/08/2021   Hypoxia 07/08/2021   Stroke (cerebrum) ([place]) 07/06/2021   Paroxysmal A-fib ([place])    Left lower lobe pneumonia 03/15/2020   Atrial fibrillation with rapid ventricular response ([place])    Acute delirium 03/11/2020   Leukocytosis 03/11/2020   Closed right hip fracture ([place]) 03/09/2020   Essential hypertension 03/09/2020   Hypokalemia 03/09/2020   Hematochezia    Rectal bleeding    GI bleed 03/05/2018   Encounter for long-term (current) use of other medications 09/25/2017   Chronic diarrhea 09/11/2017   CAD in native artery 11/08/2015   Other long term (current) drug therapy 11/08/2015   Elevated fasting blood sugar 11/08/2015   Generalized anxiety disorder 12/07/2014   Back ache 02/14/2014   Scoliosis 05/27/2012   BP (high blood pressure) 11/20/2011   HLD (hyperlipidemia) 11/20/2011    Orientation RESPIRATION BLADDER Height & Weight     Self, Time, Situation, Place  O2 (Nasal cannula 3L) Incontinent, External catheter Weight:   Height:     BEHAVIORAL SYMPTOMS/MOOD NEUROLOGICAL BOWEL NUTRITION STATUS      Incontinent Diet (no added salt)  AMBULATORY STATUS COMMUNICATION OF  NEEDS Skin   Extensive Assist Verbally Other (Comment) (non-pressure wound on face)                       Personal Care Assistance Level of Assistance  Bathing, Feeding, Dressing Bathing Assistance: Maximum assistance Feeding assistance: Limited assistance Dressing Assistance: Maximum assistance     Functional Limitations Info  Sight Sight Info: Impaired        SPECIAL CARE FACTORS FREQUENCY  PT (By licensed PT), OT (By licensed OT)     PT Frequency: min 3x/week OT Frequency: min 3x/week            Contractures Contractures Info: Not present    Additional Factors Info  Code Status, Allergies, Insulin Sliding Scale Code Status Info: DNR Allergies Info: Penicillins, Sulfa Antibiotics   Insulin Sliding Scale Info: Klonopin       Current Medications (07/08/2021):  This is the current hospital active medication list Current Facility-Administered Medications  Medication Dose Route Frequency Provider Last Rate Last Admin   acetaminophen (TYLENOL) tablet 650 mg  650 mg Oral Q4H PRN Greta Doom, MD   650 mg at 07/06/21 2207   Or   acetaminophen (TYLENOL) 160 MG/5ML solution 650 mg  650 mg Per Tube Q4H PRN Greta Doom, MD       Or   acetaminophen (TYLENOL) suppository 650 mg  650 mg Rectal Q4H PRN Greta Doom, MD       amiodarone (PACERONE) tablet 200 mg  200 mg Oral Daily [place], [place],  NP   200 mg at 07/08/21 [id number]   bacitracin ointment   Topical PRN Rozann Lesches, RPH       cefTRIAXone (ROCEPHIN) 1 g in sodium chloride 0.9 % 100 mL IVPB  1 g Intravenous Q24H Janine Ores, NP   Stopped at 07/08/21 1240   Chlorhexidine Gluconate Cloth 2 % PADS 6 each  6 each Topical Q0600 Greta Doom, MD   6 each at 07/08/21 0525   clonazePAM (KLONOPIN) tablet 0.5 mg  0.5 mg Oral BID Janine Ores, NP   0.5 mg at 07/08/21 [id number]   clonazePAM (KLONOPIN) tablet 0.5 mg  0.5 mg Oral BID PRN Janine Ores, NP   0.5 mg at 07/08/21 0220   enoxaparin  (LOVENOX) injection 40 mg  40 mg Subcutaneous Daily Garvin Fila, MD   40 mg at 07/08/21 1527   escitalopram (LEXAPRO) tablet 5 mg  5 mg Oral q morning Bonnielee Haff, MD   5 mg at 07/08/21 1018   hydrALAZINE (APRESOLINE) injection 20 mg  20 mg Intravenous Q6H PRN Janine Ores, NP   20 mg at 07/08/21 0651   labetalol (NORMODYNE) injection 10 mg  10 mg Intravenous Q10 min PRN Janine Ores, NP       MEDLINE mouth rinse  15 mL Mouth Rinse BID Greta Doom, MD   15 mL at 07/08/21 0926   metoprolol tartrate (LOPRESSOR) tablet 25 mg  25 mg Oral BID Bonnielee Haff, MD   25 mg at 07/08/21 [id number]   morphine 2 MG/ML injection 1 mg  1 mg Intravenous Once PRN Janine Ores, NP       pantoprazole (PROTONIX) EC tablet 40 mg  40 mg Oral QHS Garvin Fila, MD   40 mg at 07/07/21 2232   senna-docusate (Senokot-S) tablet 1 tablet  1 tablet Oral Daily PRN Bonnielee Haff, MD       traMADol Veatrice Bourbon) tablet 50 mg  50 mg Oral Q6H PRN Janine Ores, NP   50 mg at 07/08/21 [id number]     Discharge Medications: Please see discharge summary for a list of discharge medications.  Relevant Imaging Results:  Relevant Lab Results:   Additional Information SSN: [id number]. JANSSEN COVID-19 VACCINE 03/17/2020,  Moderna COVID-19 Vaccine 01/04/2021,  Fords COVID-19 Vaccine 03/16/2020  Lissa Morales Mariame Rybolt, LCSW

## 2021-07-08 NOTE — Assessment & Plan Note (Addendum)
CT scan showed acute intraparenchymal hemorrhage in the right thalamus.  Patient was admitted to the stroke service.   LDL 103.  Not noted to be on statin currently. HbA1c 5.3.   Echocardiogram shows normal systolic function.  Grade 1 diastolic dysfunction is noted.  No significant valvular abnormalities were appreciated. Currently off of all antiplatelet agents as well as anticoagulation. Seen by PT and OT.  Initially SNF was recommended.  Now CIR is recommended. Neurological status is stable though she continues to have significant left-sided weakness.

## 2021-07-08 NOTE — Progress Notes (Signed)
[facility] PROGRESS NOTE   Michelle Bauer UJW:[id number] DOB: 21-Sep-1940 DOA: 07/06/2021  2 DOS: the patient was seen and examined on 07/08/2021  PCP: [name], MD  Brief History and Hospital Course:  81 y.o. female with a history of atrial fibrillation who takes Eliquis at baseline she was noted to be confused with left-sided weakness around 1:30 AM on the day of admission and presented to the [facility] as a code stroke. Her BP was 158 SBP on arrival. There, a head CT revealed a thalamic IPH and she has therefore been transferred to [facility] for further management.  Also noted to have a UTI.  Consultants: Neurology  Procedures: Transthoracic echocardiogram    Subjective: Patient pleasantly confused.  Denies any chest pain or shortness of breath.  Continues to have left-sided weakness.    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  Patient was admitted to the stroke service.  LDL 103.  Not noted to be on statin currently. HbA1c 5.3.  Echocardiogram shows normal systolic function.  Grade 1 diastolic dysfunction is noted.  No significant valvular abnormalities were appreciated. MRI was ordered but then subsequently discontinued.  Will defer this to neurology service. Currently off of all antiplatelet agents as well as anticoagulation. Seen by PT and OT.  Skilled nursing facility is recommended.  Paroxysmal A-fib (HCC)- (present on admission) Noted to be on amiodarone, metoprolol and Eliquis prior to admission. Eliquis on hold due to intraparenchymal hemorrhage.  Remains on metoprolol and amiodarone.  Monitor on telemetry.  Hypoxia Noted to be hypoxic this morning.  Does not have any respiratory complaints.  Diminished air entry at the bases but mostly clear to auscultation.  We will do a chest x-ray.  Urinary tract infection- (present on admission) Noted to be on  ceftriaxone.  Urine culture growing E. coli sensitive to ceftriaxone.  Could de-escalate to oral agents but will wait till tomorrow.  Essential hypertension- (present on admission) Elevated blood pressure readings noted.  Goal blood pressure should be less than 160 systolic per neurology.  Was on Cleviprex previously but not on it currently.  Use as needed agents.  Generalized anxiety disorder- (present on admission) Continue Klonopin.  Was on Lexapro previously which could also be resumed.     DVT Prophylaxis: SCDs Code Status: DNR Family Communication: No family at bedside Disposition Plan: Will need to go to skilled nursing facility for short-term rehab  Status is: Inpatient  Remains inpatient appropriate because: Acute stroke         Medications: Scheduled:  amiodarone  200 mg Oral Daily   Chlorhexidine Gluconate Cloth  6 each Topical Q0600   clonazePAM  0.5 mg Oral BID   mouth rinse  15 mL Mouth Rinse BID   metoprolol tartrate  12.5 mg Oral BID   pantoprazole  40 mg Oral QHS   senna-docusate  1 tablet Oral BID   Continuous:  cefTRIAXone (ROCEPHIN)  IV Stopped (07/07/21 1155)   lactated ringers 50 mL/hr at 07/08/21 0700   FAO:ZHYQMVHQIONGE **OR** acetaminophen (TYLENOL) oral liquid 160 mg/5 mL **OR** acetaminophen, bacitracin, clonazePAM, hydrALAZINE, labetalol, morphine injection, traMADol  Antibiotics: Anti-infectives (From admission, onward)    Start     Dose/Rate Route Frequency Ordered Stop   07/07/21 1100  cefTRIAXone (ROCEPHIN) 1 g in sodium chloride 0.9 % 100 mL IVPB        1 g 200 mL/hr over 30 Minutes Intravenous Every  24 hours 07/07/21 0941     07/06/21 1000  nitrofurantoin (macrocrystal-monohydrate) (MACROBID) capsule 100 mg  Status:  Discontinued        100 mg Oral Every 12 hours 07/06/21 0700 07/07/21 0941       Objective:  Vital Signs  Vitals:   07/08/21 0750 07/08/21 0800 07/08/21 0810 07/08/21 0820  BP:  (!) 157/67    Pulse: 84 86 86  89  Resp: (!) 21 18 17 20   Temp:  99.1 F (37.3 C)    TempSrc:  Axillary    SpO2: (!) 85% (!) 88% (!) 87% 91%    Intake/Output Summary (Last 24 hours) at 07/08/2021 0910 Last data filed at 07/08/2021 0700 Gross per 24 hour  Intake 1494.47 ml  Output 500 ml  Net 994.47 ml   There were no vitals filed for this visit.  General appearance: Awake alert.  In no distress.  Distracted Resp: Noted to be breathing primarily through her mouth.  No increased effort noted.  Diminished air entry at the bases.  No wheezing or rhonchi appreciated.  No definite crackles Cardio: S1-S2 is normal regular.  No S3-S4.  No rubs murmurs or bruit GI: Abdomen is soft.  Nontender nondistended.  Bowel sounds are present normal.  No masses organomegaly Extremities: No edema.   Neurologic: Left hemiparesis noted.   Lab Results:  Data Reviewed: I have personally reviewed labs and imaging study reports  CBC: Recent Labs  Lab 07/06/21 0406 07/07/21 0340  WBC 12.0* 8.1  NEUTROABS 9.1*  --   HGB 12.5 11.7*  HCT 39.5 36.8  MCV 94.3 92.9  PLT 221 229    Basic Metabolic Panel: Recent Labs  Lab 07/06/21 0406 07/07/21 0340 07/08/21 0802  NA 137 138 138  K 3.9 3.5 3.6  CL 103 103 104  CO2 27 24 24   GLUCOSE 104* 117* 100*  BUN 23 22 18   CREATININE 0.70 1.04* 0.88  CALCIUM 8.8* 8.8* 8.9    GFR: Estimated Creatinine Clearance: 47.7 mL/min (by C-G formula based on SCr of 0.88 mg/dL).  Liver Function Tests: Recent Labs  Lab 07/06/21 0406  AST 21  ALT 12  ALKPHOS 103  BILITOT 0.6  PROT 7.2  ALBUMIN 3.2*     Coagulation Profile: Recent Labs  Lab 07/06/21 0406  INR 1.4*     HbA1C: Recent Labs    07/07/21 0340  HGBA1C 5.5    CBG: Recent Labs  Lab 07/06/21 0323  GLUCAP 102*    Lipid Profile: Recent Labs    07/07/21 0340  CHOL 178  HDL 44  LDLCALC 103*  TRIG 155*  CHOLHDL 4.0      Recent Results (from the past 240 hour(s))  Resp Panel by RT-PCR (Flu A&B, Covid)  Nasopharyngeal Swab     Status: None   Collection Time: 07/06/21  4:06 AM   Specimen: Nasopharyngeal Swab; Nasopharyngeal(NP) swabs in vial transport medium  Result Value Ref Range Status   SARS Coronavirus 2 by RT PCR NEGATIVE NEGATIVE Final    Comment: (NOTE) SARS-CoV-2 target nucleic acids are NOT DETECTED.  The SARS-CoV-2 RNA is generally detectable in upper respiratory specimens during the acute phase of infection. The lowest concentration of SARS-CoV-2 viral copies this assay can detect is 138 copies/mL. A negative result does not preclude SARS-Cov-2 infection and should not be used as the sole basis for treatment or other patient management decisions. A negative result may occur with  improper specimen collection/handling, submission of specimen other  than nasopharyngeal swab, presence of viral mutation(s) within the areas targeted by this assay, and inadequate number of viral copies(<138 copies/mL). A negative result must be combined with clinical observations, patient history, and epidemiological information. The expected result is Negative.  Fact Sheet for Patients:  BloggerCourse.com  Fact Sheet for Healthcare Providers:  SeriousBroker.it  This test is no t yet approved or cleared by the [place] FDA and  has been authorized for detection and/or diagnosis of SARS-CoV-2 by FDA under an Emergency Use Authorization (EUA). This EUA will remain  in effect (meaning this test can be used) for the duration of the COVID-19 declaration under Section 564(b)(1) of the Act, 21 U.S.C.section 360bbb-3(b)(1), unless the authorization is terminated  or revoked sooner.       Influenza A by PCR NEGATIVE NEGATIVE Final   Influenza B by PCR NEGATIVE NEGATIVE Final    Comment: (NOTE) The Xpert Xpress SARS-CoV-2/FLU/RSV plus assay is intended as an aid in the diagnosis of influenza from Nasopharyngeal swab specimens and should not be  used as a sole basis for treatment. Nasal washings and aspirates are unacceptable for Xpert Xpress SARS-CoV-2/FLU/RSV testing.  Fact Sheet for Patients: BloggerCourse.com  Fact Sheet for Healthcare Providers: SeriousBroker.it  This test is not yet approved or cleared by the [place] FDA and has been authorized for detection and/or diagnosis of SARS-CoV-2 by FDA under an Emergency Use Authorization (EUA). This EUA will remain in effect (meaning this test can be used) for the duration of the COVID-19 declaration under Section 564(b)(1) of the Act, 21 U.S.C. section 360bbb-3(b)(1), unless the authorization is terminated or revoked.  Performed at [facility], [address] [place], [place] [address]   MRSA Next Gen by PCR, Nasal     Status: None   Collection Time: 07/06/21  6:41 AM   Specimen: Urine, Catheterized; Nasal Swab  Result Value Ref Range Status   MRSA by PCR Next Gen NOT DETECTED NOT DETECTED Final    Comment: (NOTE) The GeneXpert MRSA Assay (FDA approved for NASAL specimens only), is one component of a comprehensive MRSA colonization surveillance program. It is not intended to diagnose MRSA infection nor to guide or monitor treatment for MRSA infections. Test performance is not FDA approved in patients less than 70 years old. Performed at [facility], [address] [place], [place] [address]   Urine Culture     Status: Abnormal   Collection Time: 07/06/21  8:19 AM   Specimen: Urine, Clean Catch  Result Value Ref Range Status   Specimen Description URINE, CLEAN CATCH  Final   Special Requests   Final    NONE Performed at [facility], [address], [place], [place] [address]    Culture >=100,000 COLONIES/mL ESCHERICHIA COLI (A)  Final   Report Status 07/08/2021 FINAL  Final   Organism ID, Bacteria ESCHERICHIA COLI (A)  Final      Susceptibility   Escherichia coli - MIC*     AMPICILLIN >=32 RESISTANT Resistant     CEFAZOLIN <=4 SENSITIVE Sensitive     CEFEPIME <=0.12 SENSITIVE Sensitive     CEFTRIAXONE <=0.25 SENSITIVE Sensitive     CIPROFLOXACIN <=0.25 SENSITIVE Sensitive     GENTAMICIN <=1 SENSITIVE Sensitive     IMIPENEM <=0.25 SENSITIVE Sensitive     NITROFURANTOIN <=16 SENSITIVE Sensitive     TRIMETH/SULFA <=20 SENSITIVE Sensitive     AMPICILLIN/SULBACTAM 16 INTERMEDIATE Intermediate     PIP/TAZO <=4 SENSITIVE Sensitive     * >=  100,000 COLONIES/mL ESCHERICHIA COLI      Radiology Studies: CT ANGIO HEAD NECK W WO CM  Result Date: 07/06/2021 CLINICAL DATA:  Neuro deficit, stroke suspected EXAM: CT ANGIOGRAPHY HEAD AND NECK TECHNIQUE: Multidetector CT imaging of the head and neck was performed using the standard protocol during bolus administration of intravenous contrast. Multiplanar CT image reconstructions and MIPs were obtained to evaluate the vascular anatomy. Carotid stenosis measurements (when applicable) are obtained utilizing NASCET criteria, using the distal internal carotid diameter as the denominator. RADIATION DOSE REDUCTION: This exam was performed according to the departmental dose-optimization program which includes automated exposure control, adjustment of the mA and/or kV according to patient size and/or use of iterative reconstruction technique. CONTRAST:  55mL OMNIPAQUE IOHEXOL 350 MG/ML SOLN COMPARISON:  No prior CTA, correlation is made with 07/06/2021 CT FINDINGS: CT HEAD FINDINGS Evaluation is somewhat limited by motion artifact. Brain: Redemonstrated acute parenchymal hemorrhage measuring 1.8 x 1.5 x 3.2 cm, centered in the right thalamus, unchanged from the prior exam. There is mild surrounding vasogenic edema without significant regional mass effect. No evidence of intraventricular extension. No new acute hemorrhage. No acute infarct, mass, mass effect, or midline shift. No hydrocephalus or extra-axial collection. Vascular: Please see CTA  findings below Skull: Previously noted right temporal scalp lesion is not well seen on the current exam. No acute osseous abnormality. Hyperostosis frontalis. Sinuses: Imaged portions are clear. Orbits: Status post bilateral lens replacements. Review of the MIP images confirms the above findings CTA NECK FINDINGS Aortic arch: Standard branching. Imaged portion shows no evidence of aneurysm or dissection. No significant stenosis of the major arch vessel origins. Aortic atherosclerosis. Calcifications are noted in the proximal left subclavian artery, which do not appear hemodynamically significant. Right carotid system: No evidence of dissection, stenosis (50% or greater) or occlusion. Left carotid system: No evidence of dissection, stenosis (50% or greater) or occlusion. Tortuosity, with extreme lateral course of the left internal carotid artery (series 10, image 188). Vertebral arteries: No evidence of dissection, stenosis (50% or greater) or occlusion. Skeleton: Degenerative changes in the cervical spine, worst at C5-C6 and C6-C7. No acute osseous abnormality. Status post median sternotomy. Other neck: Negative. Upper chest: No focal pulmonary opacity or pleural effusion. Review of the MIP images confirms the above findings CTA HEAD FINDINGS Anterior circulation: Both internal carotid arteries are patent to the termini, with moderate circumferential calcifications, which cause mild stenosis. A1 segments patent. Normal anterior communicating artery. Anterior cerebral arteries are patent to their distal aspects. No M1 stenosis or occlusion. Normal MCA bifurcations. Distal MCA branches perfused and symmetric. No evidence of active extravasation into the right thalamic hemorrhage. Posterior circulation: Vertebral arteries patent to the vertebrobasilar junction without stenosis. Posterior inferior cerebral arteries patent bilaterally. Basilar patent to its distal aspect. Superior cerebellar arteries patent bilaterally.  Bilateral P1 segments originate from the basilar artery. PCAs perfused to their distal aspects without stenosis. The bilateral posterior communicating arteries are not visualized. Venous sinuses: As permitted by contrast timing, patent. Anatomic variants: None significant Review of the MIP images confirms the above findings IMPRESSION: 1. Unchanged appearance of the previously noted acute intraparenchymal hemorrhage in the right thalamus, with unchanged surrounding vasogenic edema but without significant regional mass effect. 2.  No hemodynamically significant stenosis in the neck. 3. No intracranial large vessel occlusion or significant stenosis. No evidence of active extravasation into the right thalamic hemorrhage. Electronically Signed   By: Wiliam Ke M.D.   On: 07/06/2021 19:37   DG Chest  Port 1 View  Result Date: 07/08/2021 CLINICAL DATA:  81 year old female with right thalamic hemorrhage. Hypoxia. EXAM: PORTABLE CHEST 1 VIEW COMPARISON:  Portable chest 03/16/2020 and earlier. FINDINGS: Portable AP semi upright view at 0849 hours. Moderate to severe scoliosis. Chronic hypo ventilation at the left lung base in part related to cardiomegaly and mediastinal lipomatosis, but evidence of pleural effusion on the left in 2021 which may have increased. No superimposed pneumothorax or pulmonary edema. Prior CABG. Stable cardiac size and mediastinal contours. Visualized tracheal air column is within normal limits. IMPRESSION: 1. Chronic hypoventilation at the left lung base, but with superimposed left pleural effusion in 2021 which may have increased. Recommend chest ultrasound or CT to confirm the presence of drainable fluid prior to any attempted thoracentesis. 2. Otherwise stable cardiomegaly, scoliosis. Electronically Signed   By: Odessa Fleming M.D.   On: 07/08/2021 09:02   ECHOCARDIOGRAM COMPLETE  Result Date: 07/06/2021    ECHOCARDIOGRAM REPORT   Patient Name:   Michelle Bauer Date of Exam: 07/06/2021 Medical  Rec #:  [id number]      Height:       63.0 in Accession #:    [id number]     Weight:       148.1 lb Date of Birth:  March 30, 1941     BSA:          1.702 m Patient Age:    80 years       BP:           146/56 mmHg Patient Gender: F              HR:           69 bpm. Exam Location:  Inpatient Procedure: 2D Echo, Cardiac Doppler, Color Doppler and Intracardiac            Opacification Agent Indications:    STROKE  History:        Patient has prior history of Echocardiogram examinations, most                 recent 03/13/2020. Risk Factors:Hypertension. HLD/ PAF.  Sonographer:    Festus Barren Referring Phys: Tara.Kingdom MCNEILL P KIRKPATRICK IMPRESSIONS  1. Left ventricular ejection fraction, by estimation, is 55 to 60%. The left ventricle has normal function. The left ventricle has no regional wall motion abnormalities. Left ventricular diastolic parameters are consistent with Grade I diastolic dysfunction (impaired relaxation).  2. Right ventricular systolic function is normal. The right ventricular size is normal.  3. Left atrial size was severely dilated.  4. The mitral valve is normal in structure. No evidence of mitral valve regurgitation. No evidence of mitral stenosis.  5. The aortic valve is normal in structure. There is mild calcification of the aortic valve. There is mild thickening of the aortic valve. Aortic valve regurgitation is mild. Aortic valve sclerosis/calcification is present, without any evidence of aortic stenosis. Aortic valve area, by VTI measures 1.99 cm. Aortic valve mean gradient measures 5.0 mmHg. Aortic valve Vmax measures 1.48 m/s.  6. The inferior vena cava is normal in size with greater than 50% respiratory variability, suggesting right atrial pressure of 3 mmHg. FINDINGS  Left Ventricle: Left ventricular ejection fraction, by estimation, is 55 to 60%. The left ventricle has normal function. The left ventricle has no regional wall motion abnormalities. The left ventricular internal cavity size  was normal in size. There is  no left ventricular hypertrophy. Left ventricular diastolic parameters are consistent with Grade I diastolic  dysfunction (impaired relaxation). Right Ventricle: IVC NOT SEEN. The right ventricular size is normal. No increase in right ventricular wall thickness. Right ventricular systolic function is normal. Left Atrium: Left atrial size was severely dilated. Right Atrium: Right atrial size was normal in size. Pericardium: There is no evidence of pericardial effusion. Mitral Valve: The mitral valve is normal in structure. No evidence of mitral valve regurgitation. No evidence of mitral valve stenosis. MV peak gradient, 5.0 mmHg. The mean mitral valve gradient is 3.0 mmHg. Tricuspid Valve: The tricuspid valve is normal in structure. Tricuspid valve regurgitation is trivial. No evidence of tricuspid stenosis. Aortic Valve: The aortic valve is normal in structure. There is mild calcification of the aortic valve. There is mild thickening of the aortic valve. Aortic valve regurgitation is mild. Aortic valve sclerosis/calcification is present, without any evidence of aortic stenosis. Aortic valve mean gradient measures 5.0 mmHg. Aortic valve peak gradient measures 8.8 mmHg. Aortic valve area, by VTI measures 1.99 cm. Pulmonic Valve: The pulmonic valve was normal in structure. Pulmonic valve regurgitation is not visualized. No evidence of pulmonic stenosis. Aorta: The aortic root is normal in size and structure. Venous: The inferior vena cava is normal in size with greater than 50% respiratory variability, suggesting right atrial pressure of 3 mmHg. IAS/Shunts: No atrial level shunt detected by color flow Doppler.  LEFT VENTRICLE PLAX 2D LVIDd:         2.60 cm     Diastology LVIDs:         1.70 cm     LV e' medial:    5.55 cm/s LV PW:         1.10 cm     LV E/e' medial:  17.4 LV IVS:        0.80 cm     LV e' lateral:   9.02 cm/s LVOT diam:     2.00 cm     LV E/e' lateral: 10.7 LV SV:          63 LV SV Index:   37 LVOT Area:     3.14 cm  LV Volumes (MOD) LV vol d, MOD A2C: 42.6 ml LV vol d, MOD A4C: 78.2 ml LV vol s, MOD A2C: 15.6 ml LV vol s, MOD A4C: 36.1 ml LV SV MOD A2C:     27.0 ml LV SV MOD A4C:     78.2 ml LV SV MOD BP:      35.8 ml RIGHT VENTRICLE RV S prime:     13.30 cm/s TAPSE (M-mode): 1.8 cm LEFT ATRIUM             Index        RIGHT ATRIUM           Index LA diam:        3.80 cm 2.23 cm/m   RA Area:     16.60 cm LA Vol (A2C):   38.9 ml 22.85 ml/m  RA Volume:   36.50 ml  21.44 ml/m LA Vol (A4C):   81.6 ml 47.94 ml/m LA Biplane Vol: 60.3 ml 35.43 ml/m  AORTIC VALVE                     PULMONIC VALVE AV Area (Vmax):    2.08 cm      PV Vmax:       1.06 m/s AV Area (Vmean):   2.00 cm      PV Vmean:      69.800 cm/s AV  Area (VTI):     1.99 cm      PV VTI:        0.166 m AV Vmax:           148.00 cm/s   PV Peak grad:  4.5 mmHg AV Vmean:          106.000 cm/s  PV Mean grad:  2.0 mmHg AV VTI:            0.318 m AV Peak Grad:      8.8 mmHg AV Mean Grad:      5.0 mmHg LVOT Vmax:         97.90 cm/s LVOT Vmean:        67.500 cm/s LVOT VTI:          0.201 m LVOT/AV VTI ratio: 0.63  AORTA Ao Root diam: 3.20 cm Ao Asc diam:  2.80 cm MITRAL VALVE                TRICUSPID VALVE MV Area (PHT): 1.95 cm     TR Peak grad:   28.5 mmHg MV Area VTI:   2.51 cm     TR Vmax:        267.00 cm/s MV Peak grad:  5.0 mmHg MV Mean grad:  3.0 mmHg     SHUNTS MV Vmax:       1.12 m/s     Systemic VTI:  0.20 m MV Vmean:      83.4 cm/s    Systemic Diam: 2.00 cm MV Decel Time: 389 msec MV E velocity: 96.80 cm/s MV A velocity: 118.00 cm/s MV E/A ratio:  0.82 Chilton Si MD Electronically signed by Chilton Si MD Signature Date/Time: 07/06/2021/3:23:54 PM    Final        LOS: 2 days   Osvaldo Shipper  [facility] Pager on [email or web address]  07/08/2021, 9:10 AM

## 2021-07-08 NOTE — Assessment & Plan Note (Addendum)
Continue Lexapro and Klonopin.

## 2021-07-08 NOTE — Hospital Course (Addendum)
81 y.o. female with a history of atrial fibrillation who takes Eliquis at baseline she was noted to be confused with left-sided weakness around 1:30 AM on the day of admission and presented to the [facility] as a code stroke. Her BP was 158 SBP on arrival. There, a head CT revealed a thalamic IPH and she has therefore been transferred to [facility] for further management.  Also noted to have a UTI.  Patient was noted to be hypoxemic on the morning of 1/20.  Did not appear to be particularly symptomatic.  However required high amounts of oxygen.  This prompted chest x-ray followed by CT angiogram which did not show any PE.  Showed atelectasis.  No significant pleural effusion was noted.   Patient has been stable for the last several days.  Oxygen has been weaned down to 3 L/min.  Evaluated by PT and OT and now they think patient is a candidate for inpatient rehabilitation

## 2021-07-08 NOTE — Assessment & Plan Note (Addendum)
Was noted to be hypoxic on the morning of 1/28.   Chest x-ray suggested left-sided pleural effusion.  Patient subsequently underwent CT angiogram not show any pulmonary embolism.  No pleural effusion was noted.  Atelectasis was present.  Started on incentive spirometer.  Oxygenation has improved.  Continue to wean down oxygen.  Now down to 2 L/min.  Saturations in the 90s.

## 2021-07-09 DIAGNOSIS — D649 Anemia, unspecified: Secondary | ICD-10-CM | POA: Diagnosis present

## 2021-07-09 LAB — BASIC METABOLIC PANEL
Anion gap: 7 (ref 5–15)
BUN: 18 mg/dL (ref 8–23)
CO2: 26 mmol/L (ref 22–32)
Calcium: 8.6 mg/dL — ABNORMAL LOW (ref 8.9–10.3)
Chloride: 104 mmol/L (ref 98–111)
Creatinine, Ser: 0.83 mg/dL (ref 0.44–1.00)
GFR, Estimated: >60 mL/min (ref >60)
Glucose, Bld: 100 mg/dL — ABNORMAL HIGH (ref 70–99)
Potassium: 4 mmol/L (ref 3.5–5.1)
Sodium: 137 mmol/L (ref 135–145)

## 2021-07-09 LAB — CBC
HCT: 35.9 % — ABNORMAL LOW (ref 36.0–46.0)
Hemoglobin: 11.3 g/dL — ABNORMAL LOW (ref 12.0–15.0)
MCH: 29.5 pg (ref 26.0–34.0)
MCHC: 31.5 g/dL (ref 30.0–36.0)
MCV: 93.7 fL (ref 80.0–100.0)
Platelets: 212 10*3/uL (ref 150–400)
RBC: 3.83 MIL/uL — ABNORMAL LOW (ref 3.87–5.11)
RDW: 14.3 % (ref 11.5–15.5)
WBC: 8.8 10*3/uL (ref 4.0–10.5)
nRBC: 0 % (ref 0.0–0.2)

## 2021-07-09 LAB — GLUCOSE, CAPILLARY: Glucose-Capillary: 127 mg/dL — ABNORMAL HIGH (ref 70–99)

## 2021-07-09 MED ORDER — CEPHALEXIN 500 MG PO CAPS
500.0000 mg | ORAL_CAPSULE | Freq: Three times a day (TID) | ORAL | Status: AC
  Administered 2021-07-09 – 2021-07-10 (×4): 500 mg via ORAL
  Filled 2021-07-09 (×6): qty 1

## 2021-07-09 NOTE — Progress Notes (Signed)
[facility] PROGRESS NOTE   Michelle Bauer [id number] DOB: [birth year] DOA: 07/06/2021  3 DOS: the patient was seen and examined on 07/09/2021  PCP: Kirk Ruths, MD  Brief History and Hospital Course:  81 y.o. female with a history of atrial fibrillation who takes Eliquis at baseline she was noted to be confused with left-sided weakness around 1:30 AM on the day of admission and presented to the [facility] as a code stroke. Her BP was 158 SBP on arrival. There, a head CT revealed a thalamic IPH and she has therefore been transferred to [facility] for further management.  Also noted to have a UTI.  Patient was noted to be hypoxemic on the morning of 1/20.  Did not appear to be particularly symptomatic.  However required high amounts of oxygen.  This prompted chest x-ray followed by CT angiogram which did not show any PE.  Showed atelectasis.  No significant pleural effusion was noted.  Seems to have stabilized in the last 24 hours  Consultants: Neurology  Procedures: Transthoracic echocardiogram    Subjective: Patient remains pleasantly confused.  Eating her breakfast.  Does not have any new complaints.  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  Patient was admitted to the stroke service.  LDL 103.  Not noted to be on statin currently. HbA1c 5.3.  Echocardiogram shows normal systolic function.  Grade 1 diastolic dysfunction is noted.  No significant valvular abnormalities were appreciated. MRI was ordered but subsequently discontinued by neurology. Currently off of all antiplatelet agents as well as anticoagulation. Seen by PT and OT.  Skilled nursing facility is recommended. Neurological status is stable.  Paroxysmal A-fib ([place])- (present on admission) Noted to be on amiodarone, metoprolol and Eliquis prior to admission. Eliquis  on hold due to intraparenchymal hemorrhage.  Remains on metoprolol and amiodarone.  Monitor on telemetry.  Hypoxia Was noted to be hypoxic yesterday morning.  Chest x-ray suggested left-sided pleural effusion.  Patient subsequently underwent CT angiogram not show any pulmonary embolism.  No pleural effusion was noted.  Atelectasis was present.  Incentive spirometry has been ordered.  Patient was mobilized.  Oxygenation has improved.  Discussed with nursing staff.  Currently on 4 L.  They will try to wean it down further.    Urinary tract infection- (present on admission) Noted to be on ceftriaxone.  Urine culture growing E. coli sensitive to ceftriaxone and cefazolin.  Since patient is stable we will change her to oral Keflex.  Essential hypertension- (present on admission) Elevated blood pressure readings noted.  Goal blood pressure should be less than 0000000 systolic per neurology.  Blood pressure is reasonably well controlled.  Occasional high readings noted.  Noted to be just on metoprolol.  Generalized anxiety disorder- (present on admission) Continue Lexapro and Klonopin.  Normocytic anemia- (present on admission) Hemoglobin is low but stable.  No evidence of overt blood loss.  Continue to monitor.     DVT Prophylaxis: SCDs Code Status: DNR Family Communication: No family at bedside Disposition Plan: Will need to go to skilled nursing facility for short-term rehab  Status is: Inpatient  Remains inpatient appropriate because: Acute stroke         Medications: Scheduled:  amiodarone  200 mg Oral Daily   Chlorhexidine Gluconate Cloth  6 each Topical Q0600   clonazePAM  0.5 mg Oral BID   enoxaparin (LOVENOX) injection  40 mg Subcutaneous Daily   escitalopram  5 mg Oral q morning   mouth rinse  15 mL Mouth Rinse BID   metoprolol tartrate  25 mg Oral BID   pantoprazole  40 mg Oral QHS   Continuous:  cefTRIAXone (ROCEPHIN)  IV Stopped (07/08/21 1240)   [id number]  **OR** acetaminophen (TYLENOL) oral liquid 160 mg/5 mL **OR** acetaminophen, bacitracin, clonazePAM, hydrALAZINE, labetalol, morphine injection, senna-docusate, traMADol  Antibiotics: Anti-infectives (From admission, onward)    Start     Dose/Rate Route Frequency Ordered Stop   07/07/21 1100  cefTRIAXone (ROCEPHIN) 1 g in sodium chloride 0.9 % 100 mL IVPB        1 g 200 mL/hr over 30 Minutes Intravenous Every 24 hours 07/07/21 0941     07/06/21 1000  nitrofurantoin (macrocrystal-monohydrate) (MACROBID) capsule 100 mg  Status:  Discontinued        100 mg Oral Every 12 hours 07/06/21 0700 07/07/21 0941       Objective:  Vital Signs  Vitals:   07/09/21 0500 07/09/21 0600 07/09/21 0800 07/09/21 0900  BP: (!) 156/73 95/75 (!) 163/73 (!) 140/59  Pulse: 72 79 86 85  Resp: (!) 23 (!) 23 (!) 30 (!) 31  Temp:   99.6 F (37.6 C)   TempSrc:   Oral   SpO2: 94% 92% 95% 97%    Intake/Output Summary (Last 24 hours) at 07/09/2021 [id number] Last data filed at 07/09/2021 0800 Gross per 24 hour  Intake 1140 ml  Output 850 ml  Net 290 ml    There were no vitals filed for this visit.  General appearance: Awake alert.  In no distress.  Distracted Resp: Normal effort at rest.  Diminished air entry at the bases.  No definite wheezing rales or rhonchi. Cardio: S1-S2 is normal regular.  No S3-S4.  No rubs murmurs or bruit GI: Abdomen is soft.  Nontender nondistended.  Bowel sounds are present normal.  No masses organomegaly Extremities: No edema.   Neurologic: Left hemiparesis    Lab Results:  Data Reviewed: I have personally reviewed labs and imaging study reports  CBC: Recent Labs  Lab 07/06/21 0406 07/07/21 0340 07/09/21 0250  WBC 12.0* 8.1 8.8  NEUTROABS 9.1*  --   --   HGB 12.5 11.7* 11.3*  HCT 39.5 36.8 35.9*  MCV 94.3 92.9 93.7  PLT 221 229 212     Basic Metabolic Panel: Recent Labs  Lab 07/06/21 0406 07/07/21 0340 07/08/21 0802 07/09/21 0250  NA 137 138 138 137  K  3.9 3.5 3.6 4.0  CL 103 103 104 104  CO2 27 24 24 26   GLUCOSE 104* 117* 100* 100*  BUN 23 22 18 18   CREATININE 0.70 1.04* 0.88 0.83  CALCIUM 8.8* 8.8* 8.9 8.6*     GFR: Estimated Creatinine Clearance: 50.6 mL/min (by C-G formula based on SCr of 0.83 mg/dL).  Liver Function Tests: Recent Labs  Lab 07/06/21 0406  AST 21  ALT 12  ALKPHOS 103  BILITOT 0.6  PROT 7.2  ALBUMIN 3.2*      Coagulation Profile: Recent Labs  Lab 07/06/21 0406  INR 1.4*      HbA1C: Recent Labs    07/07/21 0340  HGBA1C 5.5     CBG: Recent Labs  Lab 07/06/21 0323  GLUCAP 102*     Lipid Profile: Recent Labs    07/07/21 0340  CHOL 178  HDL 44  LDLCALC 103*  TRIG 155*  CHOLHDL 4.0  Recent Results (from the past 240 hour(s))  Resp Panel by RT-PCR (Flu A&B, Covid) Nasopharyngeal Swab     Status: None   Collection Time: 07/06/21  4:06 AM   Specimen: Nasopharyngeal Swab; Nasopharyngeal(NP) swabs in vial transport medium  Result Value Ref Range Status   SARS Coronavirus 2 by RT PCR NEGATIVE NEGATIVE Final    Comment: (NOTE) SARS-CoV-2 target nucleic acids are NOT DETECTED.  The SARS-CoV-2 RNA is generally detectable in upper respiratory specimens during the acute phase of infection. The lowest concentration of SARS-CoV-2 viral copies this assay can detect is 138 copies/mL. A negative result does not preclude SARS-Cov-2 infection and should not be used as the sole basis for treatment or other patient management decisions. A negative result may occur with  improper specimen collection/handling, submission of specimen other than nasopharyngeal swab, presence of viral mutation(s) within the areas targeted by this assay, and inadequate number of viral copies(<138 copies/mL). A negative result must be combined with clinical observations, patient history, and epidemiological information. The expected result is Negative.  Fact Sheet for Patients:   EntrepreneurPulse.com.au  Fact Sheet for Healthcare Providers:  IncredibleEmployment.be  This test is no t yet approved or cleared by the [place] FDA and  has been authorized for detection and/or diagnosis of SARS-CoV-2 by FDA under an Emergency Use Authorization (EUA). This EUA will remain  in effect (meaning this test can be used) for the duration of the COVID-19 declaration under Section 564(b)(1) of the Act, 21 U.S.C.section 360bbb-3(b)(1), unless the authorization is terminated  or revoked sooner.       Influenza A by PCR NEGATIVE NEGATIVE Final   Influenza B by PCR NEGATIVE NEGATIVE Final    Comment: (NOTE) The Xpert Xpress SARS-CoV-2/FLU/RSV plus assay is intended as an aid in the diagnosis of influenza from Nasopharyngeal swab specimens and should not be used as a sole basis for treatment. Nasal washings and aspirates are unacceptable for Xpert Xpress SARS-CoV-2/FLU/RSV testing.  Fact Sheet for Patients: EntrepreneurPulse.com.au  Fact Sheet for Healthcare Providers: IncredibleEmployment.be  This test is not yet approved or cleared by the [place] FDA and has been authorized for detection and/or diagnosis of SARS-CoV-2 by FDA under an Emergency Use Authorization (EUA). This EUA will remain in effect (meaning this test can be used) for the duration of the COVID-19 declaration under Section 564(b)(1) of the Act, 21 U.S.C. section 360bbb-3(b)(1), unless the authorization is terminated or revoked.  Performed at [facility], [address] [place], [place] [address]   MRSA Next Gen by PCR, Nasal     Status: None   Collection Time: 07/06/21  6:41 AM   Specimen: Urine, Catheterized; Nasal Swab  Result Value Ref Range Status   MRSA by PCR Next Gen NOT DETECTED NOT DETECTED Final    Comment: (NOTE) The GeneXpert MRSA Assay (FDA approved for NASAL specimens only), is one  component of a comprehensive MRSA colonization surveillance program. It is not intended to diagnose MRSA infection nor to guide or monitor treatment for MRSA infections. Test performance is not FDA approved in patients less than 71 years old. Performed at [facility], [place] [address] [place], [place] [address]   Urine Culture     Status: Abnormal   Collection Time: 07/06/21  8:19 AM   Specimen: Urine, Clean Catch  Result Value Ref Range Status   Specimen Description URINE, CLEAN CATCH  Final   Special Requests   Final    NONE Performed at [facility], [place] [address] [place], [place] [address]    Culture >=100,000 COLONIES/mL ESCHERICHIA COLI (A)  Final   Report Status 07/08/2021 FINAL  Final   Organism ID, Bacteria ESCHERICHIA COLI (A)  Final      Susceptibility   Escherichia coli - MIC*    AMPICILLIN >=32 RESISTANT Resistant     CEFAZOLIN <=4 SENSITIVE Sensitive     CEFEPIME <=0.12 SENSITIVE Sensitive     CEFTRIAXONE <=0.25 SENSITIVE Sensitive     CIPROFLOXACIN <=0.25 SENSITIVE Sensitive     GENTAMICIN <=1 SENSITIVE Sensitive     IMIPENEM <=0.25 SENSITIVE Sensitive     NITROFURANTOIN <=16 SENSITIVE Sensitive     TRIMETH/SULFA <=20 SENSITIVE Sensitive     AMPICILLIN/SULBACTAM 16 INTERMEDIATE Intermediate     PIP/TAZO <=4 SENSITIVE Sensitive     * >=100,000 COLONIES/mL ESCHERICHIA COLI       Radiology Studies: CT Angio Chest Pulmonary Embolism (PE) W or WO Contrast  Result Date: 07/08/2021 CLINICAL DATA:  Possible pleural effusion on chest x-ray, concern for PE EXAM: CT ANGIOGRAPHY CHEST WITH CONTRAST TECHNIQUE: Multidetector CT imaging of the chest was performed using the standard protocol during bolus administration of intravenous contrast. Multiplanar CT image reconstructions and MIPs were obtained to evaluate the vascular anatomy. RADIATION DOSE REDUCTION: This exam was performed according to the departmental dose-optimization program which includes automated  exposure control, adjustment of the mA and/or kV according to patient size and/or use of iterative reconstruction technique. CONTRAST:  34mL OMNIPAQUE IOHEXOL 350 MG/ML SOLN COMPARISON:  Same-day chest radiograph FINDINGS: Cardiovascular: Is adequate opacification of the pulmonary arteries to the segmental level. There is no evidence of pulmonary embolism. Heart is enlarged. There is no pericardial effusion. The patient is status post CABG. There is scattered calcification throughout the native coronary arteries. There is calcified atherosclerotic plaque in the thoracic aorta.The aortic arch is borderline enlarged at 3.0 cm. Mediastinum/Nodes: The thyroid is unremarkable. Esophagus is grossly unremarkable. There is no mediastinal, hilar, or axillary lymphadenopathy. Lungs/Pleura: The trachea and central airways are patent. There is consolidative opacity in the posterior segment of the left upper lobe. There are additional linear opacities in the bilateral lung bases likely reflecting atelectasis. There is additional subsegmental atelectasis in the medial right lower lobe along the scoliotic spine. There is no other focal consolidation. There is no pulmonary edema. There is no pleural effusion or pneumothorax. Upper Abdomen: No acute findings. Musculoskeletal: There is marked dextrocurvature of the spine which distorts the thoracic cavity. There is no acute osseous abnormality or suspicious osseous lesion. Review of the MIP images confirms the above findings. IMPRESSION: 1. No evidence of pulmonary embolism. 2. Consolidative opacities in the left lower lobe and lung bases most likely reflect atelectasis. No pleural effusion as was questioned on the prior radiograph. 3. Borderline enlarged aortic arch measuring 3.0 cm. Recommend annual imaging followup by CTA or MRA. This recommendation follows 2010 ACCF/AHA/AATS/ACR/ASA/SCA/SCAI/SIR/STS/SVM Guidelines for the Diagnosis and Management of Patients with Thoracic Aortic  Disease. Circulation.2010; 121ML:[id number]. Aortic aneurysm NOS (ICD10-I71.9) Aortic Atherosclerosis (ICD10-I70.0). Electronically Signed   By: Valetta Mole M.D.   On: 07/08/2021 10:10   DG Chest Port 1 View  Result Date: 07/08/2021 CLINICAL DATA:  81 year old female with right thalamic hemorrhage. Hypoxia. EXAM: PORTABLE CHEST 1 VIEW COMPARISON:  Portable chest 03/16/2020 and earlier. FINDINGS: Portable AP semi upright view at 0849 hours. Moderate to severe scoliosis. Chronic hypo ventilation at the left lung base in part related to cardiomegaly and mediastinal lipomatosis,  but evidence of pleural effusion on the left in 2021 which may have increased. No superimposed pneumothorax or pulmonary edema. Prior CABG. Stable cardiac size and mediastinal contours. Visualized tracheal air column is within normal limits. IMPRESSION: 1. Chronic hypoventilation at the left lung base, but with superimposed left pleural effusion in 2021 which may have increased. Recommend chest ultrasound or CT to confirm the presence of drainable fluid prior to any attempted thoracentesis. 2. Otherwise stable cardiomegaly, scoliosis. Electronically Signed   By: Genevie Ann M.D.   On: 07/08/2021 09:02       LOS: 3 days   [facility] Pager on [email or web address]  07/09/2021, 9:52 AM

## 2021-07-09 NOTE — Progress Notes (Addendum)
Patient transferred to 3W04. Daughter [name] called and updated. Michelle Bauer, [place]rape, RN

## 2021-07-09 NOTE — Progress Notes (Addendum)
STROKE TEAM PROGRESS NOTE   INTERVAL HISTORY Patient is seen in her room with no family at the bedside.  Her oxygen requirements have decreased, and she is ready to transfer out of the ICU. Vitals:   07/09/21 0900 07/09/21 1000 07/09/21 1100 07/09/21 1200  BP: (!) 140/59 (!) 150/64 (!) 160/65 (!) 142/67  Pulse: 85 84 74 72  Resp: (!) 31 (!) 38 (!) 24 (!) 31  Temp:    98.3 F (36.8 C)  TempSrc:    Oral  SpO2: 97% 95% 98% 98%   CBC:  Recent Labs  Lab 07/06/21 0406 07/07/21 0340 07/09/21 0250  WBC 12.0* 8.1 8.8  NEUTROABS 9.1*  --   --   HGB 12.5 11.7* 11.3*  HCT 39.5 36.8 35.9*  MCV 94.3 92.9 93.7  PLT 221 229 [id number]    Basic Metabolic Panel:  Recent Labs  Lab 07/08/21 0802 07/09/21 0250  NA 138 137  K 3.6 4.0  CL 104 104  CO2 24 26  GLUCOSE 100* 100*  BUN 18 18  CREATININE 0.88 0.83  CALCIUM 8.9 8.6*    Lipid Panel:  Recent Labs  Lab 07/07/21 0340  CHOL 178  TRIG 155*  HDL 44  CHOLHDL 4.0  VLDL 31  LDLCALC 103*    HgbA1c:  Recent Labs  Lab 07/07/21 0340  HGBA1C 5.5    Urine Drug Screen:  Recent Labs  Lab 07/06/21 0440  LABOPIA NONE DETECTED  COCAINSCRNUR NONE DETECTED  LABBENZ NONE DETECTED  AMPHETMU NONE DETECTED  THCU NONE DETECTED  LABBARB NONE DETECTED     Alcohol Level  Recent Labs  Lab 07/06/21 0406  ETH <10     IMAGING past 24 hours No results found.  PHYSICAL EXAM  Physical Exam  Constitutional: Appears well-developed and well-nourished pleasant elderly Caucasian lady.  Psych: Affect appropriate to situation Cardiovascular: Normal rate and regular rhythm.  Respiratory: Effort normal, non-labored breathing, on 4L o2 via nasal cannula  Neuro: Mental Status: Patient is awake, alert, oriented to person and place.  Mild dysarthria noted.  Cranial Nerves: II: Pupils are equal, round, and reactive to light.  Poor vision at baseline III,IV, VI: EOMI without ptosis or diplopia, right gaze preference. Blinks to threat on  right side only V: Facial sensation is symmetric to light touch VII: Left facial droop VIII: Hearing is intact to voice XII: Tongue protrudes midline without atrophy or fasciculations.  Motor: RUE 5/5 LUE 0/5 diminished on the left RLE 5/5  LLE 0/5 (may be effort dependent) Plantars: Toes are downgoing bilaterally.  Sensory: sensory deficit noted in left hand   ASSESSMENT/PLAN Ms. MUNNI CONLEY is a 81 y.o. female with history of atrial fibrillation who takes Eliquis presenting with  left-sided weakness and confusion.  Her BP was 158 SBP on arrival. Head CT revealed a thalamic IPH and she has therefore been transferred to [facility] for further management.  WBC 12.0, all other labs unremarkable.  Mild UTI, Macrobid started.  Cleviprex no longer needed for BP control.  MRI pending.  She developed hypoxia 1/20 and was sent for CT of her chest, which revealed no PE.   Stroke:  34ml Right thalamic IPH likely secondary due to use of anticoagulation and hypertension Code Stroke - 1.8 x 1.5 x 3.2 cm acute intraparenchymal hemorrhage centered at the right thalamus (estimated volume 4 mL). Underlying atrophy with chronic small vessel ischemic disease. Multifocal soft tissue contusions about the scalp vertex. 1.6 cm nodular soft tissue lesion at  the right temporal scalp.  MRI - Pending   2D Echo EF [id number], grade 1 diastolic dysfunction, no atrial level shunt LDL 48 HgbA1c 5.3 VTE prophylaxis -SCDs Eliquis (apixaban) daily prior to admission, now on No antithrombotic.  Hold anticoagulation until it is appropriate to restart Therapy recommendations: SNF Disposition: Pending  Atrial fibrillation Home medication: Eliquis 5 mg twice daily, amiodarone 200 mg daily Currently rate controlled  Hypertension Home meds: Lopressor 12.5 mg, resumed Stable Keep blood pressure less than systolic 0000000 Cleviprex IV infusion no longer needed  Other Stroke Risk Factors Advanced Age >/= 87  Family hx stroke (Mother,  Sister)  Other Active Problems Anxiety/Depression Klonopin 0.5 mg 4 times a day, Lexapro 5 mg daily Klonopin restarted BID with PRN dose Ultram resumed q6prn  Mild UTI Macrobid 100 mg q12 ordered- > switch to ceftriaxone 1g > switch to cephalexin 500 mg q8h Reporting urinary frequency Urine culture sent Urinalysis showed moderate Hgb, moderate leukocytes  Hospital day # 3  Patient seen and examined by NP/APP with MD. MD to update note as needed.   [place] , MSN, AGACNP-BC [facility] See Amion for schedule and pager information 07/09/2021 12:53 PM  ATTENDING NOTE: I reviewed above note and agree with the assessment and plan. Pt was seen and examined.   81 year old female with history of A. fib on Eliquis admitted for left-sided weakness and confusion.  CT showed right thalamic ICH.  Repeat CT stable hematoma.  CTA head and neck unremarkable, no aneurysm or AVM.  MRI brain pending.  Creatinine 1.04-0.88.  UA WBC more than 50.  WBC 12.0->8.1.  LDL 103, A1c 5.5  On exam, patient reclining in bed, lethargic asking for sleep/rest.  She is awake alert, orientated to self, age and months but not to year.  No aphasia, paucity of speech, follows some commands.  Right gaze preference, left visual neglect, not blinking to be described on the left.  Left facial droop, left hemiplegia with only mild withdrawal on the left lower extremity on pain stimulation.  Left upper extremity diminished sensation, left lower extremity decreased sensation.  Left sensation neglect.  Coordination not corporative.  Etiology for patient likely due to hypertensive given location in the setting of Eliquis use.  Currently antithrombotic on hold due to [place].  BP controlled, off Cleviprex.  On Rocephin for UTI.  Continue amiodarone and metoprolol for A. fib rate control.  PT/OT recommend SNF.  For detailed assessment and plan, please refer to above as I have made changes wherever appropriate.    Rosalin Hawking, MD PhD Stroke Neurology 07/09/2021 6:35 PM    To contact Stroke Continuity provider, please refer to [email or web address] After hours, contact General Neurology

## 2021-07-09 NOTE — Progress Notes (Signed)
Received transfer via bed from 4N ICU; patient is alert and oriented; oriented to room and unit routine; tele. Monitor applied; vital signs taken; patient repositioned; bed low, locked, and bed alarm set.  Patient continues with O2 4L [place].

## 2021-07-09 NOTE — Assessment & Plan Note (Addendum)
Hemoglobin is low but stable.  No evidence of overt blood loss.  Monitor periodically.

## 2021-07-09 NOTE — TOC Progression Note (Signed)
Transition of Care [facility]) - Progression Note    Patient Details  Name: RICQUEL FOULK MRN: [id number] Date of Birth: 1940-07-15  Transition [facility] CM/SW Contact  Carley Hammed, [place] Phone Number: 07/09/2021, 12:54 PM  Clinical Narrative:    CSW provided bed offers to pt and dtr at bedside. CSW provided contact information for any further questions or needs.. TOC will continue to follow for DC needs.     Barriers to Discharge: Continued Medical Work up, SNF Pending bed offer  Expected Discharge Plan and Services   In-house Referral: Clinical Social Work   Post Acute Care Choice: Resumption of Svcs/PTA Provider Living arrangements for the past 2 months: Assisted Living Facility                                       Social Determinants of Health (SDOH) Interventions    Readmission Risk Interventions No flowsheet data found.

## 2021-07-10 LAB — BASIC METABOLIC PANEL
Anion gap: 8 (ref 5–15)
BUN: 17 mg/dL (ref 8–23)
CO2: 26 mmol/L (ref 22–32)
Calcium: 8.6 mg/dL — ABNORMAL LOW (ref 8.9–10.3)
Chloride: 102 mmol/L (ref 98–111)
Creatinine, Ser: 0.81 mg/dL (ref 0.44–1.00)
GFR, Estimated: >60 mL/min (ref >60)
Glucose, Bld: 105 mg/dL — ABNORMAL HIGH (ref 70–99)
Potassium: 3.9 mmol/L (ref 3.5–5.1)
Sodium: 136 mmol/L (ref 135–145)

## 2021-07-10 LAB — GLUCOSE, CAPILLARY
Glucose-Capillary: 100 mg/dL — ABNORMAL HIGH (ref 70–99)
Glucose-Capillary: 136 mg/dL — ABNORMAL HIGH (ref 70–99)
Glucose-Capillary: 122 mg/dL — ABNORMAL HIGH (ref 70–99)
Glucose-Capillary: 126 mg/dL — ABNORMAL HIGH (ref 70–99)

## 2021-07-10 NOTE — Progress Notes (Signed)
[facility] PROGRESS NOTE   Michelle Bauer [id number] DOB: 02/10/1941 DOA: 07/06/2021  4 DOS: the patient was seen and examined on 07/10/2021  PCP: Kirk Ruths, MD  Brief History and Hospital Course:  81 y.o. female with a history of atrial fibrillation who takes Eliquis at baseline she was noted to be confused with left-sided weakness around 1:30 AM on the day of admission and presented to the [facility] as a code stroke. Her BP was 158 SBP on arrival. There, a head CT revealed a thalamic IPH and she has therefore been transferred to [facility] for further management.  Also noted to have a UTI.  Patient was noted to be hypoxemic on the morning of 1/20.  Did not appear to be particularly symptomatic.  However required high amounts of oxygen.  This prompted chest x-ray followed by CT angiogram which did not show any PE.  Showed atelectasis.  No significant pleural effusion was noted.    Consultants: Neurology  Procedures: Transthoracic echocardiogram    Subjective: Denies any complaints.  Worried about the stroke and had some consequences.  Distracted at other times    Assessment/Plan:  * Intracranial hemorrhage (HCC) CT scan showed acute intraparenchymal hemorrhage in the right thalamus.  Patient was admitted to the stroke service.   LDL 103.  Not noted to be on statin currently. HbA1c 5.3.   Echocardiogram shows normal systolic function.  Grade 1 diastolic dysfunction is noted.  No significant valvular abnormalities were appreciated. MRI was ordered but subsequently discontinued by neurology. Currently off of all antiplatelet agents as well as anticoagulation. Seen by PT and OT.  Skilled nursing facility is recommended. Neurological status remains stable.  Paroxysmal A-fib ([place])- (present on admission) Noted to be on amiodarone, metoprolol and Eliquis prior to admission. Eliquis on hold due to intraparenchymal hemorrhage.   Remains on  metoprolol and amiodarone.  Monitor on telemetry.  Hypoxia Was noted to be hypoxic on the morning of 1/28.   Chest x-ray suggested left-sided pleural effusion.  Patient subsequently underwent CT angiogram not show any pulmonary embolism.  No pleural effusion was noted.  Atelectasis was present.  Started on incentive spirometer.  Oxygenation has improved.  Continue to wean down oxygen.   Urinary tract infection- (present on admission) Noted to be on ceftriaxone.  Urine culture growing E. coli sensitive to ceftriaxone and cefazolin.  She was transitioned to Keflex.  Essential hypertension- (present on admission) Elevated blood pressure readings noted.  Goal blood pressure should be less than 0000000 systolic per neurology.  Remains on metoprolol.  Blood pressure is reasonably well controlled.  Generalized anxiety disorder- (present on admission) Continue Lexapro and Klonopin.  Normocytic anemia- (present on admission) Hemoglobin is low but stable.  No evidence of overt blood loss.  Monitor periodically.     DVT Prophylaxis: SCDs Code Status: DNR Family Communication: No family at bedside Disposition Plan: Will need to go to skilled nursing facility for short-term rehab  Status is: Inpatient  Remains inpatient appropriate because: Acute stroke         Medications: Scheduled:  amiodarone  200 mg Oral Daily   cephALEXin  500 mg Oral Q8H   Chlorhexidine Gluconate Cloth  6 each Topical Q0600   clonazePAM  0.5 mg Oral BID   enoxaparin (LOVENOX) injection  40 mg Subcutaneous Daily   escitalopram  5 mg Oral q morning   mouth rinse  15 mL Mouth Rinse BID   metoprolol tartrate  25 mg  Oral BID   pantoprazole  40 mg Oral QHS   Continuous:   [id number] **OR** acetaminophen (TYLENOL) oral liquid 160 mg/5 mL **OR** acetaminophen, bacitracin, clonazePAM, hydrALAZINE, labetalol, morphine injection, senna-docusate, traMADol  Antibiotics: Anti-infectives (From admission, onward)     Start     Dose/Rate Route Frequency Ordered Stop   07/09/21 1400  cephALEXin (KEFLEX) capsule 500 mg        500 mg Oral Every 8 hours 07/09/21 0954 07/10/21 2159   07/07/21 1100  cefTRIAXone (ROCEPHIN) 1 g in sodium chloride 0.9 % 100 mL IVPB  Status:  Discontinued        1 g 200 mL/hr over 30 Minutes Intravenous Every 24 hours 07/07/21 0941 07/09/21 0954   07/06/21 1000  nitrofurantoin (macrocrystal-monohydrate) (MACROBID) capsule 100 mg  Status:  Discontinued        100 mg Oral Every 12 hours 07/06/21 0700 07/07/21 0941       Objective:  Vital Signs  Vitals:   07/09/21 2304 07/10/21 0337 07/10/21 0724 07/10/21 0805  BP: (!) 170/73 (!) 149/62 (!) 164/65 (!) 148/61  Pulse: 68 65 68 79  Resp: 16 16 18    Temp: 98 F (36.7 C) 97.6 F (36.4 C) 98 F (36.7 C)   TempSrc: Oral Oral Oral   SpO2: 93% 97% 98% (!) 89%    Intake/Output Summary (Last 24 hours) at 07/10/2021 1144 Last data filed at 07/10/2021 0800 Gross per 24 hour  Intake 760 ml  Output 700 ml  Net 60 ml    There were no vitals filed for this visit.  General appearance: Awake alert.  In no distress.  Distracted Resp: Clear to auscultation bilaterally.  Normal effort Cardio: S1-S2 is normal regular.  No S3-S4.  No rubs murmurs or bruit GI: Abdomen is soft.  Nontender nondistended.  Bowel sounds are present normal.  No masses organomegaly Extremities: No edema.  Neurologic: Left hemiparesis     Lab Results:  Data Reviewed: I have personally reviewed labs and imaging study reports  CBC: Recent Labs  Lab 07/06/21 0406 07/07/21 0340 07/09/21 0250  WBC 12.0* 8.1 8.8  NEUTROABS 9.1*  --   --   HGB 12.5 11.7* 11.3*  HCT 39.5 36.8 35.9*  MCV 94.3 92.9 93.7  PLT 221 229 212     Basic Metabolic Panel: Recent Labs  Lab 07/06/21 0406 07/07/21 0340 07/08/21 0802 07/09/21 0250 07/10/21 0156  NA 137 138 138 137 136  K 3.9 3.5 3.6 4.0 3.9  CL 103 103 104 104 102  CO2 27 24 24 26 26   GLUCOSE 104*  117* 100* 100* 105*  BUN 23 22 18 18 17   CREATININE 0.70 1.04* 0.88 0.83 0.81  CALCIUM 8.8* 8.8* 8.9 8.6* 8.6*     GFR: Estimated Creatinine Clearance: 51.8 mL/min (by C-G formula based on SCr of 0.81 mg/dL).  Liver Function Tests: Recent Labs  Lab 07/06/21 0406  AST 21  ALT 12  ALKPHOS 103  BILITOT 0.6  PROT 7.2  ALBUMIN 3.2*      Coagulation Profile: Recent Labs  Lab 07/06/21 0406  INR 1.4*      HbA1C: No results for input(s): HGBA1C in the last 72 hours.   CBG: Recent Labs  Lab 07/06/21 0323 07/09/21 2108 07/10/21 0611  GLUCAP 102* 127* 100*     Lipid Profile: No results for input(s): CHOL, HDL, LDLCALC, TRIG, CHOLHDL, LDLDIRECT in the last 72 hours.     Recent Results (from the past 240 hour(s))  Resp Panel by RT-PCR (Flu A&B, Covid) Nasopharyngeal Swab     Status: None   Collection Time: 07/06/21  4:06 AM   Specimen: Nasopharyngeal Swab; Nasopharyngeal(NP) swabs in vial transport medium  Result Value Ref Range Status   SARS Coronavirus 2 by RT PCR NEGATIVE NEGATIVE Final    Comment: (NOTE) SARS-CoV-2 target nucleic acids are NOT DETECTED.  The SARS-CoV-2 RNA is generally detectable in upper respiratory specimens during the acute phase of infection. The lowest concentration of SARS-CoV-2 viral copies this assay can detect is 138 copies/mL. A negative result does not preclude SARS-Cov-2 infection and should not be used as the sole basis for treatment or other patient management decisions. A negative result may occur with  improper specimen collection/handling, submission of specimen other than nasopharyngeal swab, presence of viral mutation(s) within the areas targeted by this assay, and inadequate number of viral copies(<138 copies/mL). A negative result must be combined with clinical observations, patient history, and epidemiological information. The expected result is Negative.  Fact Sheet for Patients:   EntrepreneurPulse.com.au  Fact Sheet for Healthcare Providers:  IncredibleEmployment.be  This test is no t yet approved or cleared by the [place] FDA and  has been authorized for detection and/or diagnosis of SARS-CoV-2 by FDA under an Emergency Use Authorization (EUA). This EUA will remain  in effect (meaning this test can be used) for the duration of the COVID-19 declaration under Section 564(b)(1) of the Act, 21 U.S.C.section 360bbb-3(b)(1), unless the authorization is terminated  or revoked sooner.       Influenza A by PCR NEGATIVE NEGATIVE Final   Influenza B by PCR NEGATIVE NEGATIVE Final    Comment: (NOTE) The Xpert Xpress SARS-CoV-2/FLU/RSV plus assay is intended as an aid in the diagnosis of influenza from Nasopharyngeal swab specimens and should not be used as a sole basis for treatment. Nasal washings and aspirates are unacceptable for Xpert Xpress SARS-CoV-2/FLU/RSV testing.  Fact Sheet for Patients: EntrepreneurPulse.com.au  Fact Sheet for Healthcare Providers: IncredibleEmployment.be  This test is not yet approved or cleared by the [place] FDA and has been authorized for detection and/or diagnosis of SARS-CoV-2 by FDA under an Emergency Use Authorization (EUA). This EUA will remain in effect (meaning this test can be used) for the duration of the COVID-19 declaration under Section 564(b)(1) of the Act, 21 U.S.C. section 360bbb-3(b)(1), unless the authorization is terminated or revoked.  Performed at [facility], [address] [place], [place] [address]   MRSA Next Gen by PCR, Nasal     Status: None   Collection Time: 07/06/21  6:41 AM   Specimen: Urine, Catheterized; Nasal Swab  Result Value Ref Range Status   MRSA by PCR Next Gen NOT DETECTED NOT DETECTED Final    Comment: (NOTE) The GeneXpert MRSA Assay (FDA approved for NASAL specimens only), is one  component of a comprehensive MRSA colonization surveillance program. It is not intended to diagnose MRSA infection nor to guide or monitor treatment for MRSA infections. Test performance is not FDA approved in patients less than 19 years old. Performed at [facility], [place] [address] [place], [place] [address]   Urine Culture     Status: Abnormal   Collection Time: 07/06/21  8:19 AM   Specimen: Urine, Clean Catch  Result Value Ref Range Status   Specimen Description URINE, CLEAN CATCH  Final   Special Requests   Final    NONE Performed at [facility], [place] [place],  [place] [address]    Culture >=100,000 COLONIES/mL ESCHERICHIA COLI (A)  Final   Report Status 07/08/2021 FINAL  Final   Organism ID, Bacteria ESCHERICHIA COLI (A)  Final      Susceptibility   Escherichia coli - MIC*    AMPICILLIN >=32 RESISTANT Resistant     CEFAZOLIN <=4 SENSITIVE Sensitive     CEFEPIME <=0.12 SENSITIVE Sensitive     CEFTRIAXONE <=0.25 SENSITIVE Sensitive     CIPROFLOXACIN <=0.25 SENSITIVE Sensitive     GENTAMICIN <=1 SENSITIVE Sensitive     IMIPENEM <=0.25 SENSITIVE Sensitive     NITROFURANTOIN <=16 SENSITIVE Sensitive     TRIMETH/SULFA <=20 SENSITIVE Sensitive     AMPICILLIN/SULBACTAM 16 INTERMEDIATE Intermediate     PIP/TAZO <=4 SENSITIVE Sensitive     * >=100,000 COLONIES/mL ESCHERICHIA COLI       Radiology Studies: No results found.     LOS: 4 days   Mazzie Brodrick [employer] on [email or web address]  07/10/2021, 11:44 AM

## 2021-07-10 NOTE — Progress Notes (Addendum)
STROKE TEAM PROGRESS NOTE   INTERVAL HISTORY Patient is seen in her room with her daughter at the bedside.  She has moved out of the ICU.  She has been hemodynamically stable and her neurological exam is stable.  Vitals:   07/09/21 2304 07/10/21 0337 07/10/21 0724 07/10/21 0805  BP: (!) 170/73 (!) 149/62 (!) 164/65 (!) 148/61  Pulse: 68 65 68 79  Resp: 16 16 18    Temp: 98 F (36.7 C) 97.6 F (36.4 C) 98 F (36.7 C)   TempSrc: Oral Oral Oral   SpO2: 93% 97% 98% (!) 89%   CBC:  Recent Labs  Lab 07/06/21 0406 07/07/21 0340 07/09/21 0250  WBC 12.0* 8.1 8.8  NEUTROABS 9.1*  --   --   HGB 12.5 11.7* 11.3*  HCT 39.5 36.8 35.9*  MCV 94.3 92.9 93.7  PLT 221 229 [id number]    Basic Metabolic Panel:  Recent Labs  Lab 07/09/21 0250 07/10/21 0156  NA 137 136  K 4.0 3.9  CL 104 102  CO2 26 26  GLUCOSE 100* 105*  BUN 18 17  CREATININE 0.83 0.81  CALCIUM 8.6* 8.6*    Lipid Panel:  Recent Labs  Lab 07/07/21 0340  CHOL 178  TRIG 155*  HDL 44  CHOLHDL 4.0  VLDL 31  LDLCALC 103*    HgbA1c:  Recent Labs  Lab 07/07/21 0340  HGBA1C 5.5    Urine Drug Screen:  Recent Labs  Lab 07/06/21 0440  LABOPIA NONE DETECTED  COCAINSCRNUR NONE DETECTED  LABBENZ NONE DETECTED  AMPHETMU NONE DETECTED  THCU NONE DETECTED  LABBARB NONE DETECTED     Alcohol Level  Recent Labs  Lab 07/06/21 0406  ETH <10     IMAGING past 24 hours No results found.  PHYSICAL EXAM  Physical Exam  Constitutional: Appears well-developed and well-nourished pleasant elderly Caucasian lady.  Psych: Affect appropriate to situation Cardiovascular: Normal rate and regular rhythm.  Respiratory: Effort normal, non-labored breathing, on o2 via nasal cannula  Neuro: Mental Status: Patient is awake, alert, oriented to person, place and time.  Mild dysarthria noted.  Cranial Nerves: II: Pupils are equal, round, and reactive to light.  Poor vision at baseline III,IV, VI: EOMI without ptosis or  diplopia, right gaze preference. Blinks to threat on right side only V: Facial sensation is symmetric to light touch VII: Left facial droop VIII: Hearing is intact to voice XII: Tongue protrudes midline without atrophy or fasciculations.  Motor: RUE 5/5 LUE 0/5 diminished on the left RLE 5/5  LLE 0/5 (may be effort dependent) Plantars: Toes are downgoing bilaterally.  Sensory: sensory deficit noted in left hand   ASSESSMENT/PLAN Michelle Bauer is a 81 y.o. female with history of atrial fibrillation who takes Eliquis presenting with  left-sided weakness and confusion.  Her BP was 158 SBP on arrival. Head CT revealed a thalamic IPH and she has therefore been transferred to [facility] for further management.  WBC 12.0, all other labs unremarkable.  Mild UTI, Macrobid started.  Cleviprex no longer needed for BP control.  MRI pending.  She developed hypoxia 1/20 and was sent for CT of her chest, which revealed no PE.   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 Underlying atrophy with chronic small vessel ischemic disease. Multifocal soft tissue contusions about the scalp vertex.  1.6 cm nodular soft tissue lesion at the right temporal scalp.  2D Echo EF [id number], grade 1 diastolic dysfunction, no atrial level shunt LDL 48 HgbA1c 5.3 VTE prophylaxis -SCDs Eliquis (apixaban) daily prior to admission, now on No antithrombotic.  Hold anticoagulation until it is appropriate to restart Therapy recommendations: SNF Disposition: Pending  Atrial fibrillation Home medication: Eliquis 5 mg twice daily, amiodarone 200 mg daily Currently rate controlled  Hypertension Home meds: Lopressor 12.5 mg, resumed Stable Keep blood pressure less than systolic 0000000 Cleviprex IV infusion no longer needed  Other Stroke Risk Factors Advanced Age  >/= 55  Family hx stroke (Mother, Sister)  Other Active Problems Anxiety/Depression Klonopin 0.5 mg 4 times a day, Lexapro 5 mg daily Klonopin restarted BID with PRN dose Ultram resumed q6prn  Mild UTI Macrobid 100 mg q12 ordered- > switch to ceftriaxone 1g > switch to cephalexin 500 mg q8h Reporting urinary frequency Urine culture sent Urinalysis showed moderate Hgb, moderate leukocytes  Hospital day # 4  Patient seen and examined by NP/APP with MD. MD to update note as needed.   [place] , MSN, AGACNP-BC [facility] See Amion for schedule and pager information 07/10/2021 11:50 AM  ATTENDING NOTE: I reviewed above note and agree with the assessment and plan. Pt was seen and examined.   Daughter at the bedside, pt lying in bed, no complains, not in distress. Tracking bilaterally and no visual deficit today, moving LLE 2/5 proximally, improved from yesterday.   On exam, patient lying in bed, awake alert, orientated to self, age and months but not to year.  No aphasia, paucity of speech, follows simple commands.  no gaze palsy, tracking bilaterally, visual field full.  Left facial droop, left UE plegia, LLE proximal 2/5, distal slight movement of toes.  Left face, UE and LE sensation loss. R FTN grossly intact but very slow in action.  Etiology for patient likely due to hypertensive given location in the setting of Eliquis use.  Currently antithrombotic on hold due to [place].  BP controlled.  UTI treatment per primary team. Continue amiodarone and metoprolol for A. fib rate control.  PT/OT recommend SNF.  For detailed assessment and plan, please refer to above as I have made changes wherever appropriate.   Neurology will sign off. Please call with questions. Pt will follow up with stroke clinic Dr. Leonie Man at [facility] in about 4 weeks. Thanks for the consult.   Michelle Hawking, MD PhD Stroke Neurology 07/10/2021 2:56 PM    To contact Stroke Continuity provider,  please refer to [email or web address] After hours, contact General Neurology

## 2021-07-11 LAB — BASIC METABOLIC PANEL
Anion gap: 11 (ref 5–15)
BUN: 15 mg/dL (ref 8–23)
CO2: 23 mmol/L (ref 22–32)
Calcium: 8.6 mg/dL — ABNORMAL LOW (ref 8.9–10.3)
Chloride: 101 mmol/L (ref 98–111)
Creatinine, Ser: 0.65 mg/dL (ref 0.44–1.00)
GFR, Estimated: >60 mL/min (ref >60)
Glucose, Bld: 102 mg/dL — ABNORMAL HIGH (ref 70–99)
Potassium: 3.5 mmol/L (ref 3.5–5.1)
Sodium: 135 mmol/L (ref 135–145)

## 2021-07-11 LAB — GLUCOSE, CAPILLARY: Glucose-Capillary: 103 mg/dL — ABNORMAL HIGH (ref 70–99)

## 2021-07-11 MED ORDER — TRAMADOL HCL 50 MG PO TABS: 50.0000 mg | ORAL_TABLET | Freq: Four times a day (QID) | ORAL | Status: DC | PRN

## 2021-07-11 MED ORDER — SENNOSIDES-DOCUSATE SODIUM 8.6-50 MG PO TABS: 1.0000 | ORAL_TABLET | Freq: Every day | ORAL | Status: DC | PRN

## 2021-07-11 MED ORDER — ACETAMINOPHEN 500 MG PO TABS: 1000.0000 mg | ORAL_TABLET | Freq: Four times a day (QID) | ORAL | 0 refills | Status: DC | PRN

## 2021-07-11 MED ORDER — METOPROLOL TARTRATE 25 MG PO TABS: 25.0000 mg | ORAL_TABLET | Freq: Two times a day (BID) | ORAL | Status: AC

## 2021-07-11 MED ORDER — PANTOPRAZOLE SODIUM 40 MG PO TBEC: 40.0000 mg | DELAYED_RELEASE_TABLET | Freq: Every day | ORAL | Status: AC

## 2021-07-11 MED ORDER — POLYETHYLENE GLYCOL 3350 17 G PO PACK
17.0000 g | PACK | Freq: Every day | ORAL | Status: DC
  Administered 2021-07-15: 17 g via ORAL
  Filled 2021-07-11 (×5): qty 1

## 2021-07-11 MED ORDER — CLONAZEPAM 0.5 MG PO TABS: 0.5000 mg | ORAL_TABLET | Freq: Two times a day (BID) | ORAL | 0 refills | Status: DC | PRN

## 2021-07-11 MED ORDER — CLONAZEPAM 0.5 MG PO TABS: 0.5000 mg | ORAL_TABLET | Freq: Two times a day (BID) | ORAL | 0 refills | Status: DC

## 2021-07-11 MED ORDER — POLYETHYLENE GLYCOL 3350 17 G PO PACK: 17.0000 g | PACK | Freq: Every day | ORAL | 0 refills | Status: AC

## 2021-07-11 MED ORDER — POTASSIUM CHLORIDE 20 MEQ PO PACK
40.0000 meq | PACK | Freq: Once | ORAL | Status: AC
  Administered 2021-07-11: 40 meq via ORAL
  Filled 2021-07-11: qty 2

## 2021-07-11 NOTE — Progress Notes (Signed)
Physical Therapy Treatment Patient Details Name: Michelle Bauer MRN: [id number] DOB: 10/17/1940 Today's Date: 07/11/2021   History of Present Illness 81 y/o female presented to [facility] ED on 07/06/21 following fall hitting head at ALF and after EMS arrived, patient with slurred speech and L sided weakness with subsequent fall out of wheelchair with EMS present. 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 PMH: Afib, HTN, anxiety, scoliosis    PT Comments    Pt with overall improvement compared to previous session. Pt with improved sitting EOB balance and minimal pusher syndrome with R UE. Pt remains to have significant decrease in sensation in L UE and LE and no voluntary movement or proprioception. Pt with improved ability to power up and complete std pvt transfer this date with strong dependence on R UE and LE. Discussed at length with pt and daughter who reports pt resides in an ALF. PT changing recommendations from SNF to CIR as pt demonstrates excellent rehab potential to be able to achieve safe w/c level of mobility and minA for dressing, bathing, and tolieting. Dtr, Michelle Bauer to call to verify she can get this level of care at her ALF but is pretty confident she can. 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 Acute PT to cont to follow.,   Recommendations for follow up therapy are one component of a multi-disciplinary discharge planning process, led by the attending physician.  Recommendations may be updated based on patient status, additional functional criteria and insurance authorization.  Follow Up Recommendations  Acute inpatient rehab (3hours/day)     Assistance Recommended at Discharge Frequent or constant Supervision/Assistance  Patient can return home with the following Two people to help with walking and/or transfers;Two people to help with bathing/dressing/bathroom;Assistance with cooking/housework;Assistance with feeding;Direct supervision/assist for financial management;Direct supervision/assist  for medications management;Assist for transportation   Equipment Recommendations  Other (comment) (TBD)    Recommendations for Other Services       Precautions / Restrictions Precautions Precautions: Fall Precaution Comments: SBP <160, hx of severe scoliosis, L inattention Restrictions Weight Bearing Restrictions: No Other Position/Activity Restrictions: scoliosis     Mobility  Bed Mobility Overal bed mobility: Needs Assistance Bed Mobility: Rolling, Sidelying to Sit Rolling: Mod assist Sidelying to sit: Max assist, HOB elevated       General bed mobility comments: max directional verbal cues for rolling, bend up R LE and reach across for bed rail with R UE, modA to complete log roll, maxA for trunk elevation to EOB due to inability to use L UE to push up    Transfers Overall transfer level: Needs assistance Equipment used: 2 person hand held assist (face to face transfer with gait belt) Transfers: Sit to/from Stand, Bed to chair/wheelchair/BSC Sit to Stand: Mod assist, +2 safety/equipment Stand pivot transfers: Max assist, +2 physical assistance, +2 safety/equipment         General transfer comment: pt with good power up from EOB and commode, pt with strong push with R UE and LE. Pt requiring maxA on L UE and LEdue to no sensation or ability to actively move it. Pt completed 3 sit to stands and 2 std pvt transfers, to [facility] and then to chair. Pt maintained one stand >1 min for hygiene s/p BM on BSC. Pt with difficulty maintaining full upright posture    Ambulation/Gait               General Gait Details: unable at this time   Stairs  Wheelchair Mobility    Modified Rankin (Stroke Patients Only) Modified Rankin (Stroke Patients Only) Pre-Morbid Rankin Score: Moderately severe disability Modified Rankin: Severe disability     Balance Overall balance assessment: Needs assistance Sitting-balance support: No upper extremity supported, Feet  supported Sitting balance-Leahy Scale: Poor Sitting balance - Comments: able to maintain more midline with minimal pushing with R UE this date however with significant forward flexion requiring max verbal cues to sit up straight and stick chest out however pt unable to maintain > 10 sec Postural control: Left lateral lean (anterior lean) Standing balance support: Bilateral upper extremity supported Standing balance-Leahy Scale: Zero Standing balance comment: maxA+2 to maintain standing                            Cognition Arousal/Alertness: Awake/alert Behavior During Therapy: WFL for tasks assessed/performed Overall Cognitive Status: History of cognitive impairments - at baseline                                 General Comments: hx of cognitive deficits per SLP notes. Following commands appropriately, pt oriented, able to follow simple commands, noted L sided inattention, pt with noted delay in response but approriate        Exercises Other Exercises Other Exercises: worked on EOB static sitting balance x 5 min    General Comments General comments (skin integrity, edema, etc.): SPO2 at 90% on RA, 93% on 3Lo2 via [place]      Pertinent Vitals/Pain Pain Assessment Pain Assessment: No/denies pain Consolability: no need to console    Home Living                          Prior Function            PT Goals (current goals can now be found in the care plan section) Acute Rehab PT Goals Patient Stated Goal: did not state PT Goal Formulation: With patient/family Time For Goal Achievement: 07/21/21 Potential to Achieve Goals: Fair Progress towards PT goals: Progressing toward goals    Frequency    Min 4X/week      PT Plan Frequency needs to be updated;Discharge plan needs to be updated    Co-evaluation PT/OT/SLP Co-Evaluation/Treatment: Yes Reason for Co-Treatment: To address functional/ADL transfers PT goals addressed during session:  Mobility/safety with mobility        AM-PAC PT "6 Clicks" Mobility   Outcome Measure  Help needed turning from your back to your side while in a flat bed without using bedrails?: Total Help needed moving from lying on your back to sitting on the side of a flat bed without using bedrails?: Total Help needed moving to and from a bed to a chair (including a wheelchair)?: Total Help needed standing up from a chair using your arms (e.g., wheelchair or bedside chair)?: Total Help needed to walk in hospital room?: Total Help needed climbing 3-5 steps with a railing? : Total 6 Click Score: 6    End of Session Equipment Utilized During Treatment: Gait belt Activity Tolerance: Patient tolerated treatment well Patient left: in chair;with call bell/phone within reach;with chair alarm set Nurse Communication: Mobility status PT Visit Diagnosis: Unsteadiness on feet (R26.81);Muscle weakness (generalized) (M62.81);Other abnormalities of gait and mobility (R26.89);History of falling (Z91.81);Other symptoms and signs involving the nervous system [id number])     [id number] PT Time Calculation (  min) (ACUTE ONLY): 53 min  Charges:  $Therapeutic Activity: 8-22 mins $Neuromuscular Re-education: 8-22 mins                     Kittie Plater, PT, DPT Acute Rehabilitation Services Pager #: [phone] Office #: [phone]    Berline Lopes 07/11/2021, 9:52 AM

## 2021-07-11 NOTE — TOC Progression Note (Signed)
Transition of Care [facility]) - Progression Note    Patient Details  Name: Michelle Bauer MRN: [id number] Date of Birth: 08-Sep-1940  Transition of Care [facility]) CM/SW [place], [place] Phone Number: 07/11/2021, 11:21 AM  Clinical Narrative:   CSW notified by PT and OT of hope for change to CIR, if patient's ALF can handle min assist and wheelchair level at discharge. CSW contacted [facility], spoke with Jeannetta Nap and explained the situation. Per Jeannetta Nap, that would be a level of function that they could accommodate, but they would likely not be able to do anything more than that. CSW notified rehab admissions. CSW to follow.      Barriers to Discharge: Continued Medical Work up, SNF Pending bed offer  Expected Discharge Plan and Services   In-house Referral: Clinical Social Work   Post Acute Care Choice: Resumption of Svcs/PTA Provider Living arrangements for the past 2 months: Assisted Living Facility                                       Social Determinants of Health (SDOH) Interventions    Readmission Risk Interventions No flowsheet data found.

## 2021-07-11 NOTE — Progress Notes (Signed)
[facility] PROGRESS NOTE   Michelle Bauer [id number] DOB: [birth year] DOA: 07/06/2021  5 DOS: the patient was seen and examined on 07/11/2021  PCP: Kirk Ruths, MD  Brief History and Hospital Course:  81 y.o. female with a history of atrial fibrillation who takes Eliquis at baseline she was noted to be confused with left-sided weakness around 1:30 AM on the day of admission and presented to the [facility] as a code stroke. Her BP was 158 SBP on arrival. There, a head CT revealed a thalamic IPH and she has therefore been transferred to [facility] for further management.  Also noted to have a UTI.  Patient was noted to be hypoxemic on the morning of 1/20.  Did not appear to be particularly symptomatic.  However required high amounts of oxygen.  This prompted chest x-ray followed by CT angiogram which did not show any PE.  Showed atelectasis.  No significant pleural effusion was noted.   Patient has been stable for the last several days.  Oxygen has been weaned down to 3 L/min.  Evaluated by PT and OT and now they think patient is a candidate for inpatient rehabilitation  Consultants: Neurology  Procedures: Transthoracic echocardiogram    Subjective: No complaints offered today.     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  Patient was admitted to the stroke service.   LDL 103.  Not noted to be on statin currently. HbA1c 5.3.   Echocardiogram shows normal systolic function.  Grade 1 diastolic dysfunction is noted.  No significant valvular abnormalities were appreciated. Currently off of all antiplatelet agents as well as anticoagulation. Seen by PT and OT.  Initially SNF was recommended.  Now CIR is recommended. Neurological status is stable.  Paroxysmal A-fib ([place])- (present on admission) Noted to be on amiodarone, metoprolol and Eliquis prior to admission. Eliquis on  hold due to intraparenchymal hemorrhage.   Remains on metoprolol and amiodarone.  Monitor on telemetry.  Hypoxia Was noted to be hypoxic on the morning of 1/28.   Chest x-ray suggested left-sided pleural effusion.  Patient subsequently underwent CT angiogram not show any pulmonary embolism.  No pleural effusion was noted.  Atelectasis was present.  Started on incentive spirometer.  Oxygenation has improved.  Continue to wean down oxygen.   Urinary tract infection- (present on admission) Noted to be on ceftriaxone.  Urine culture growing E. coli sensitive to ceftriaxone and cefazolin.  She was transitioned to Keflex.  She has completed a 5-day course.  Essential hypertension- (present on admission) Goal blood pressure should be less than 0000000 systolic per neurology.  Remains on metoprolol.  Blood pressure is reasonably well controlled.  Occasional high readings are noted.  Generalized anxiety disorder- (present on admission) Continue Lexapro and Klonopin.  Normocytic anemia- (present on admission) Hemoglobin is low but stable.  No evidence of overt blood loss.  Monitor periodically.     DVT Prophylaxis: SCDs Code Status: DNR Family Communication: No family at bedside Disposition Plan: CIR versus SNF  Status is: Inpatient  Remains inpatient appropriate because: Acute stroke         Medications: Scheduled:  amiodarone  200 mg Oral Daily   clonazePAM  0.5 mg Oral BID   enoxaparin (LOVENOX) injection  40 mg Subcutaneous Daily   escitalopram  5 mg Oral q morning   mouth rinse  15 mL Mouth Rinse BID   metoprolol tartrate  25 mg  Oral BID   pantoprazole  40 mg Oral QHS   polyethylene glycol  17 g Oral Daily   Continuous:   [id number] **OR** acetaminophen (TYLENOL) oral liquid 160 mg/5 mL **OR** acetaminophen, bacitracin, clonazePAM, hydrALAZINE, labetalol, morphine injection, senna-docusate, traMADol  Antibiotics: Anti-infectives (From admission, onward)     Start     Dose/Rate Route Frequency Ordered Stop   07/09/21 1400  cephALEXin (KEFLEX) capsule 500 mg        500 mg Oral Every 8 hours 07/09/21 0954 07/10/21 1504   07/07/21 1100  cefTRIAXone (ROCEPHIN) 1 g in sodium chloride 0.9 % 100 mL IVPB  Status:  Discontinued        1 g 200 mL/hr over 30 Minutes Intravenous Every 24 hours 07/07/21 0941 07/09/21 0954   07/06/21 1000  nitrofurantoin (macrocrystal-monohydrate) (MACROBID) capsule 100 mg  Status:  Discontinued        100 mg Oral Every 12 hours 07/06/21 0700 07/07/21 0941       Objective:  Vital Signs  Vitals:   07/10/21 1954 07/10/21 2324 07/11/21 0335 07/11/21 1104  BP: (!) 159/69 (!) 174/66 (!) 142/67 139/65  Pulse: 70 63 80 76  Resp: 16  18 18   Temp: 98.3 F (36.8 C) 97.8 F (36.6 C) 98.7 F (37.1 C) 98.4 F (36.9 C)  TempSrc: Oral Oral Oral Oral  SpO2: 95% 97% 94% 98%    Intake/Output Summary (Last 24 hours) at 07/11/2021 1124 Last data filed at 07/11/2021 [id number] Gross per 24 hour  Intake 820 ml  Output 825 ml  Net -5 ml    There were no vitals filed for this visit.  General appearance: Awake alert.  In no distress Resp: Clear to auscultation bilaterally.  Normal effort Cardio: S1-S2 is normal regular.  No S3-S4.  No rubs murmurs or bruit GI: Abdomen is soft.  Nontender nondistended.  Bowel sounds are present normal.  No masses organomegaly Neurologic: Left hemiparesis     Lab Results:  Data Reviewed: I have personally reviewed labs and imaging study reports  CBC: Recent Labs  Lab 07/06/21 0406 07/07/21 0340 07/09/21 0250  WBC 12.0* 8.1 8.8  NEUTROABS 9.1*  --   --   HGB 12.5 11.7* 11.3*  HCT 39.5 36.8 35.9*  MCV 94.3 92.9 93.7  PLT 221 229 212     Basic Metabolic Panel: Recent Labs  Lab 07/07/21 0340 07/08/21 0802 07/09/21 0250 07/10/21 0156 07/11/21 0338  NA 138 138 137 136 135  K 3.5 3.6 4.0 3.9 3.5  CL 103 104 104 102 101  CO2 24 24 26 26 23   GLUCOSE 117* 100* 100* 105* 102*  BUN  22 18 18 17 15   CREATININE 1.04* 0.88 0.83 0.81 0.65  CALCIUM 8.8* 8.9 8.6* 8.6* 8.6*     GFR: Estimated Creatinine Clearance: 52.5 mL/min (by C-G formula based on SCr of 0.65 mg/dL).  Liver Function Tests: Recent Labs  Lab 07/06/21 0406  AST 21  ALT 12  ALKPHOS 103  BILITOT 0.6  PROT 7.2  ALBUMIN 3.2*      Coagulation Profile: Recent Labs  Lab 07/06/21 0406  INR 1.4*       CBG: Recent Labs  Lab 07/10/21 0611 07/10/21 1230 07/10/21 1555 07/10/21 2111 07/11/21 0615  GLUCAP 100* 136* 122* 126* 103*      Recent Results (from the past 240 hour(s))  Resp Panel by RT-PCR (Flu A&B, Covid) Nasopharyngeal Swab     Status: None   Collection Time: 07/06/21  4:06 AM   Specimen: Nasopharyngeal Swab; Nasopharyngeal(NP) swabs in vial transport medium  Result Value Ref Range Status   SARS Coronavirus 2 by RT PCR NEGATIVE NEGATIVE Final    Comment: (NOTE) SARS-CoV-2 target nucleic acids are NOT DETECTED.  The SARS-CoV-2 RNA is generally detectable in upper respiratory specimens during the acute phase of infection. The lowest concentration of SARS-CoV-2 viral copies this assay can detect is 138 copies/mL. A negative result does not preclude SARS-Cov-2 infection and should not be used as the sole basis for treatment or other patient management decisions. A negative result may occur with  improper specimen collection/handling, submission of specimen other than nasopharyngeal swab, presence of viral mutation(s) within the areas targeted by this assay, and inadequate number of viral copies(<138 copies/mL). A negative result must be combined with clinical observations, patient history, and epidemiological information. The expected result is Negative.  Fact Sheet for Patients:  EntrepreneurPulse.com.au  Fact Sheet for Healthcare Providers:  IncredibleEmployment.be  This test is no t yet approved or cleared by the [place] FDA  and  has been authorized for detection and/or diagnosis of SARS-CoV-2 by FDA under an Emergency Use Authorization (EUA). This EUA will remain  in effect (meaning this test can be used) for the duration of the COVID-19 declaration under Section 564(b)(1) of the Act, 21 U.S.C.section 360bbb-3(b)(1), unless the authorization is terminated  or revoked sooner.       Influenza A by PCR NEGATIVE NEGATIVE Final   Influenza B by PCR NEGATIVE NEGATIVE Final    Comment: (NOTE) The Xpert Xpress SARS-CoV-2/FLU/RSV plus assay is intended as an aid in the diagnosis of influenza from Nasopharyngeal swab specimens and should not be used as a sole basis for treatment. Nasal washings and aspirates are unacceptable for Xpert Xpress SARS-CoV-2/FLU/RSV testing.  Fact Sheet for Patients: EntrepreneurPulse.com.au  Fact Sheet for Healthcare Providers: IncredibleEmployment.be  This test is not yet approved or cleared by the [place] FDA and has been authorized for detection and/or diagnosis of SARS-CoV-2 by FDA under an Emergency Use Authorization (EUA). This EUA will remain in effect (meaning this test can be used) for the duration of the COVID-19 declaration under Section 564(b)(1) of the Act, 21 U.S.C. section 360bbb-3(b)(1), unless the authorization is terminated or revoked.  Performed at [facility], [address] [place], [place] [address]   MRSA Next Gen by PCR, Nasal     Status: None   Collection Time: 07/06/21  6:41 AM   Specimen: Urine, Catheterized; Nasal Swab  Result Value Ref Range Status   MRSA by PCR Next Gen NOT DETECTED NOT DETECTED Final    Comment: (NOTE) The GeneXpert MRSA Assay (FDA approved for NASAL specimens only), is one component of a comprehensive MRSA colonization surveillance program. It is not intended to diagnose MRSA infection nor to guide or monitor treatment for MRSA infections. Test performance is not FDA  approved in patients less than 53 years old. Performed at [facility], [place] [address] [place], [place] [address]   Urine Culture     Status: Abnormal   Collection Time: 07/06/21  8:19 AM   Specimen: Urine, Clean Catch  Result Value Ref Range Status   Specimen Description URINE, CLEAN CATCH  Final   Special Requests   Final    NONE Performed at [facility], [place] [address] [place], [place] [address]    Culture >=100,000 COLONIES/mL ESCHERICHIA COLI (A)  Final   Report Status 07/08/2021 FINAL  Final  Organism ID, Bacteria ESCHERICHIA COLI (A)  Final      Susceptibility   Escherichia coli - MIC*    AMPICILLIN >=32 RESISTANT Resistant     CEFAZOLIN <=4 SENSITIVE Sensitive     CEFEPIME <=0.12 SENSITIVE Sensitive     CEFTRIAXONE <=0.25 SENSITIVE Sensitive     CIPROFLOXACIN <=0.25 SENSITIVE Sensitive     GENTAMICIN <=1 SENSITIVE Sensitive     IMIPENEM <=0.25 SENSITIVE Sensitive     NITROFURANTOIN <=16 SENSITIVE Sensitive     TRIMETH/SULFA <=20 SENSITIVE Sensitive     AMPICILLIN/SULBACTAM 16 INTERMEDIATE Intermediate     PIP/TAZO <=4 SENSITIVE Sensitive     * >=100,000 COLONIES/mL ESCHERICHIA COLI       Radiology Studies: No results found.     LOS: 5 days   Destinee Taber [employer] on [email or web address]  07/11/2021, 11:24 AM

## 2021-07-11 NOTE — Progress Notes (Signed)
Occupational Therapy Treatment Patient Details Name: Michelle Bauer MRN: [id number] DOB: [birth year] Today's Date: 07/11/2021   History of present illness 81 y/o female presented to [facility] ED on 07/06/21 following fall hitting head at ALF and after EMS arrived, patient with slurred speech and L sided weakness with subsequent fall out of wheelchair with EMS present. 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 PMH: Afib, HTN, anxiety, scoliosis (Simultaneous filing. User may not have seen previous data.)   OT comments  Pt progressing towards OT goals this session, demonstrating increased seated balance, improved transfers pushing up through R side, motivation to work towards independence at a WC level. LUE remains without sensation or movement. PROM provided at the digits, elbow, shoulder. Pt demonstrating increased attention to the left side from previous sessions. Pt able to complete transfer from the Bed>BSC>Recliner with max A +2. She was also able to complete sit<>stand for significant time for peri care (x2) while 3rd person provided peri care. At this time due, changing recommendation from SNF to AIR level post-acute due to the patients ability to withstand 3 hours of therapy a day, motivation, and improvements thus far in overall function/performance. Daughter Michelle Bauer) is going to call ALF and ensure that they can provide assist for Pt at [facility] level to be appropriate after AIR level therapy of 7-14 days. OT will continue to follow acutely.    Recommendations for follow up therapy are one component of a multi-disciplinary discharge planning process, led by the attending physician.  Recommendations may be updated based on patient status, additional functional criteria and insurance authorization.    Follow Up Recommendations  Acute inpatient rehab (3hours/day)    Assistance Recommended at Discharge Frequent or constant Supervision/Assistance  Patient can return home with the following  Two people to help  with walking and/or transfers;Two people to help with bathing/dressing/bathroom;Direct supervision/assist for financial management;Assist for transportation;Assistance with cooking/housework;Assistance with feeding;Help with stairs or ramp for entrance;Direct supervision/assist for medications management   Equipment Recommendations  Wheelchair (measurements OT);Wheelchair cushion (measurements OT)    Recommendations for Other Services Rehab consult;PT consult    Precautions / Restrictions Precautions Precautions: Fall (Simultaneous filing. User may not have seen previous data.) Precaution Comments: SBP <160, hx of severe scoliosis, L inattention (Simultaneous filing. User may not have seen previous data.) Restrictions Weight Bearing Restrictions: No (Simultaneous filing. User may not have seen previous data.) Other Position/Activity Restrictions: scoliosis (Simultaneous filing. User may not have seen previous data.)       Mobility Bed Mobility Overal bed mobility: Needs Assistance (Simultaneous filing. User may not have seen previous data.) Bed Mobility: Supine to Sit (Simultaneous filing. User may not have seen previous data.)     Supine to sit: Max assist     General bed mobility comments: assist for L LE and trunk elevation (Simultaneous filing. User may not have seen previous data.)    Transfers Overall transfer level: Needs assistance (Simultaneous filing. User may not have seen previous data.) Equipment used: 2 person hand held assist (Simultaneous filing. User may not have seen previous data.) Transfers: Sit to/from Stand, Bed to chair/wheelchair/BSC (Simultaneous filing. User may not have seen previous data.) Sit to Stand: Mod assist, +2 safety/equipment (good push up on R side, L requires blocking and heavier assist  Simultaneous filing. User may not have seen previous data.) Stand pivot transfers: Max assist, +2 physical assistance, +2 safety/equipment (Simultaneous  filing. User may not have seen previous data.)         General  transfer comment: maxA+2 to transfer to bed>BSC<>recliner. Pt with good power up through R side, needs significantly more assist on L side (Simultaneous filing. User may not have seen previous data.)     Balance Overall balance assessment: Needs assistance (Simultaneous filing. User may not have seen previous data.) Sitting-balance support: Single extremity supported, Feet supported (Simultaneous filing. User may not have seen previous data.) Sitting balance-Leahy Scale: Poor (Simultaneous filing. User may not have seen previous data.) Sitting balance - Comments: Pt able to sit min guard, less of a L push than previous session (Simultaneous filing. User may not have seen previous data.)   Standing balance support: Bilateral upper extremity supported (Simultaneous filing. User may not have seen previous data.) Standing balance-Leahy Scale: Zero (Simultaneous filing. User may not have seen previous data.) Standing balance comment: maxA+2 to maintain standing (Simultaneous filing. User may not have seen previous data.)                           ADL either performed or assessed with clinical judgement   ADL Overall ADL's : Needs assistance/impaired     Grooming: Wash/dry face;Set up;Sitting Grooming Details (indicate cue type and reason): requires assist for BUE tasks Upper Body Bathing: Moderate assistance;Sitting   Lower Body Bathing: Total assistance;Bed level   Upper Body Dressing : Maximal assistance Upper Body Dressing Details (indicate cue type and reason): to don new gown Lower Body Dressing: Total assistance;+2 for physical assistance;Sit to/from stand   Toilet Transfer: Maximal assistance;+2 for physical assistance;+2 for safety/equipment;Stand-pivot;BSC/3in1 Toilet Transfer Details (indicate cue type and reason): good push up from R side Toileting- Clothing Manipulation and Hygiene: Total assistance;+2  for physical assistance;+2 for safety/equipment;Sit to/from stand Toileting - Clothing Manipulation Details (indicate cue type and reason): Pt able to maintain standing for significant timeframe for 3rd person to complete peri care     Functional mobility during ADLs: Maximal assistance;+2 for physical assistance;+2 for safety/equipment (SPT face to face 2 person) General ADL Comments: Pt demonstrating increased sitting balance and increasing attention to the left side    Extremity/Trunk Assessment Upper Extremity Assessment Upper Extremity Assessment: LUE deficits/detail LUE Deficits / Details: no sensation, no movement LUE Sensation: decreased light touch LUE Coordination: decreased fine motor;decreased gross motor   Lower Extremity Assessment Lower Extremity Assessment: Defer to PT evaluation        Vision       Perception     Praxis      Cognition Arousal/Alertness: Awake/alert (Simultaneous filing. User may not have seen previous data.) Behavior During Therapy: [facility] for tasks assessed/performed (Simultaneous filing. User may not have seen previous data.) Overall Cognitive Status: History of cognitive impairments - at baseline (Simultaneous filing. User may not have seen previous data.)                                 General Comments: hx of cognitive deficits per SLP notes. Following commands appropriately. increased processing time required (Simultaneous filing. User may not have seen previous data.)        Exercises Exercises: Other exercises (PROM for digits, elbow, shoulder with LUE  Simultaneous filing. User may not have seen previous data.)    Shoulder Instructions       General Comments pn RA throughout the session, SpO2 >90 with activity, but once seated in recliner at end of session SpO2 down to 89-90% so returned 3L  O2 via [place] to patient (Simultaneous filing. User may not have seen previous data.)    Pertinent Vitals/ Pain       Pain  Assessment Pain Assessment: No/denies pain (Simultaneous filing. User may not have seen previous data.) Faces Pain Scale: No hurt Pain Intervention(s): Monitored during session, Repositioned  Home Living                                          Prior Functioning/Environment              Frequency  Min 2X/week        Progress Toward Goals  OT Goals(current goals can now be found in the care plan section)  Progress towards OT goals: Progressing toward goals  Acute Rehab OT Goals Patient Stated Goal: Return to ALF OT Goal Formulation: With patient Time For Goal Achievement: 07/21/21 Potential to Achieve Goals: Good  Plan Discharge plan needs to be updated    Co-evaluation    PT/OT/SLP Co-Evaluation/Treatment: Yes Reason for Co-Treatment: For patient/therapist safety;To address functional/ADL transfers;Necessary to address cognition/behavior during functional activity (Simultaneous filing. User may not have seen previous data.) PT goals addressed during session: Mobility/safety with mobility;Balance;Strengthening/ROM (Simultaneous filing. User may not have seen previous data.) OT goals addressed during session: ADL's and self-care;Strengthening/ROM      AM-PAC OT "6 Clicks" Daily Activity     Outcome Measure   Help from another person eating meals?: A Little Help from another person taking care of personal grooming?: A Little Help from another person toileting, which includes using toliet, bedpan, or urinal?: A Lot Help from another person bathing (including washing, rinsing, drying)?: A Lot Help from another person to put on and taking off regular upper body clothing?: A Lot Help from another person to put on and taking off regular lower body clothing?: A Lot 6 Click Score: 14    End of Session Equipment Utilized During Treatment: Gait belt;Oxygen (3L at end of session)  OT Visit Diagnosis: Unsteadiness on feet (R26.81);Muscle weakness  (generalized) (M62.81);History of falling (Z91.81);Other symptoms and signs involving cognitive function;Hemiplegia and hemiparesis Hemiplegia - Right/Left: Left Hemiplegia - dominant/non-dominant: Non-Dominant Hemiplegia - caused by: Nontraumatic intracerebral hemorrhage   Activity Tolerance Patient tolerated treatment well   Patient Left in chair;with call bell/phone within reach;with chair alarm set;with family/visitor present   Nurse Communication Need for lift equipment (stedy appropriate, or SPT to the Right (strong side))        Time: 0973-5329 OT Time Calculation (min): 48 min  Charges: OT General Charges $OT Visit: 1 Visit OT Treatments $Self Care/Home Management : 8-22 mins  Nyoka Cowden OTR/L Acute Rehabilitation Services Pager: [phone] Office: [phone]   Evern Bio Solae Norling 07/11/2021, 9:52 AM

## 2021-07-11 NOTE — Progress Notes (Signed)
Inpatient Rehab Admissions Coordinator:  ? ?Per therapy recommendations,  patient was screened for CIR candidacy by Xaivier Malay, MS, CCC-SLP. At this time, Pt. Appears to be a a potential candidate for CIR. I will place   order for rehab consult per protocol for full assessment. Please contact me any with questions. ? ?Keyshun Elpers, MS, CCC-SLP ?Rehab Admissions Coordinator  ?[phone] (celll) ?[phone] (office) ? ?

## 2021-07-11 NOTE — Care Management Important Message (Signed)
Important Message  Patient Details  Name: Michelle Bauer MRN: [id number] Date of Birth: May 21, 1941   Medicare Important Message Given:  Yes     Dorena Bodo 07/11/2021, 3:25 PM

## 2021-07-12 NOTE — Progress Notes (Signed)
Physical Therapy Treatment Patient Details Name: Michelle Bauer MRN: [id number] DOB: Apr 18, 1941 Today's Date: 07/12/2021   History of Present Illness 81 y/o female presented to [facility] ED on 07/06/21 following fall hitting head at ALF and after EMS arrived, patient with slurred speech and L sided weakness with subsequent fall out of wheelchair with EMS present. 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 PMH: Afib, HTN, anxiety, scoliosis    PT Comments    Pt continues with dense hemiplegia on the L with significantly impaired sensation as well and inability to use L UE and LE functionally. Worked on EOB balance, R LE exercises, and std pvt transfers to recliner. Pt with good power up with R UE and LE, maxA to advance L LE for steping. Continue to recommend CIR upon d/c to achieve safe minA level of function from w/c level to return to her ALF safely.    Recommendations for follow up therapy are one component of a multi-disciplinary discharge planning process, led by the attending physician.  Recommendations may be updated based on patient status, additional functional criteria and insurance authorization.  Follow Up Recommendations  Acute inpatient rehab (3hours/day)     Assistance Recommended at Discharge Frequent or constant Supervision/Assistance  Patient can return home with the following Two people to help with walking and/or transfers;Two people to help with bathing/dressing/bathroom;Assistance with cooking/housework;Assistance with feeding;Direct supervision/assist for financial management;Direct supervision/assist for medications management;Assist for transportation   Equipment Recommendations  Other (comment) (TBD)    Recommendations for Other Services       Precautions / Restrictions Precautions Precautions: Fall Precaution Comments: SBP <160, hx of severe scoliosis, L inattention, L hemiplegia Restrictions Weight Bearing Restrictions: No Other Position/Activity Restrictions:  scoliosis     Mobility  Bed Mobility Overal bed mobility: Needs Assistance Bed Mobility: Rolling, Sidelying to Sit Rolling: Max assist Sidelying to sit: Max assist, HOB elevated       General bed mobility comments: pt with no carry over of log rolling to the L from yesterday requiring max verbal and tactile cues to reach across with R UE for bedrail. MaxA for trunk elevation and to bring LEs off EOB due to inability to push up or use L UE or LE functionally    Transfers Overall transfer level: Needs assistance Equipment used: 2 person hand held assist (face to face transfer with L LE blocked and use of gait belt) Transfers: Sit to/from Stand, Bed to chair/wheelchair/BSC Sit to Stand: Mod assist, +2 safety/equipment (good push up on R side, L requires blocking and heavier assist) Stand pivot transfers: Max assist, +2 physical assistance, +2 safety/equipment Step pivot transfers: Max assist, +2 physical assistance       General transfer comment: maxA+2 to transfer to bed>BSC<>recliner. Pt with good power up through R side, needs significantly more assist on L side    Ambulation/Gait               General Gait Details: unable at this time   Stairs             Wheelchair Mobility    Modified Rankin (Stroke Patients Only) Modified Rankin (Stroke Patients Only) Pre-Morbid Rankin Score: Moderately severe disability Modified Rankin: Severe disability     Balance Overall balance assessment: Needs assistance Sitting-balance support: Single extremity supported, Feet supported Sitting balance-Leahy Scale: Poor Sitting balance - Comments: pt with increased trunk flexion and inability to sit without assist today. Pt requiring minimal minA to modA to maitnain upright and midline posture,  pt with more pushing with R UE today requiring max verbal cues not to push. Pt able to say when she was leaning to the L but unable to self correct without assist Postural control: Left  lateral lean (anterior lean) Standing balance support: Bilateral upper extremity supported Standing balance-Leahy Scale: Zero Standing balance comment: maxA+2 to maintain standing                            Cognition Arousal/Alertness: Awake/alert Behavior During Therapy: Flat affect (more of a flat affect today compared to yesterday) Overall Cognitive Status: History of cognitive impairments - at baseline                                 General Comments: hx of cognitive deficits per SLP notes. Following commands appropriately. increased processing time required        Exercises General Exercises - Lower Extremity Ankle Circles/Pumps: AROM, Right, 10 reps, Seated Long Arc Quad: AROM, Right, 10 reps, Seated (against manual resistance) Heel Slides: AROM, Right, 10 reps, Seated (with manual resistance) Hip Flexion/Marching: AROM, Right, 10 reps, Seated Other Exercises Other Exercises: worked on EOB static sitting balance x 5 min    General Comments General comments (skin integrity, edema, etc.): SpO2 at 87% on RA, 94% on 2LO2 via [place]      Pertinent Vitals/Pain Pain Assessment Pain Assessment: No/denies pain    Home Living                          Prior Function            PT Goals (current goals can now be found in the care plan section) Acute Rehab PT Goals Patient Stated Goal: did not state PT Goal Formulation: With patient/family Time For Goal Achievement: 07/21/21 Potential to Achieve Goals: Fair Progress towards PT goals: Progressing toward goals    Frequency    Min 4X/week      PT Plan Frequency needs to be updated;Discharge plan needs to be updated    Co-evaluation              AM-PAC PT "6 Clicks" Mobility   Outcome Measure  Help needed turning from your back to your side while in a flat bed without using bedrails?: Total Help needed moving from lying on your back to sitting on the side of a flat bed without  using bedrails?: Total Help needed moving to and from a bed to a chair (including a wheelchair)?: Total Help needed standing up from a chair using your arms (e.g., wheelchair or bedside chair)?: Total Help needed to walk in hospital room?: Total Help needed climbing 3-5 steps with a railing? : Total 6 Click Score: 6    End of Session Equipment Utilized During Treatment: Gait belt Activity Tolerance: Patient tolerated treatment well Patient left: in chair;with call bell/phone within reach;with chair alarm set Nurse Communication: Mobility status PT Visit Diagnosis: Unsteadiness on feet (R26.81);Muscle weakness (generalized) (M62.81);Other abnormalities of gait and mobility (R26.89);History of falling (Z91.81);Other symptoms and signs involving the nervous system (R29.898)     Time:  -     Charges:  $Therapeutic Activity: 8-22 mins $Neuromuscular Re-education: 8-22 mins                     Lewis Shock, PT, DPT Acute Rehabilitation Services Pager #:  [phone] Office #: [phone]    Rozell Searingshly M Damari Suastegui 07/12/2021, 11:47 AM

## 2021-07-12 NOTE — H&P (Shared)
Physical Medicine and Rehabilitation Admission H&P    CC: Functional decline due to  stroke and    HPI: Michelle Bauer is a 81 year old female with history of HTN, PAF-on eliquis, anxiety/depression,scoliosis who was admitted via [facility] on 07/06/21 after fall with difficulty speaking and left sided weakness at her ALF. CT head showed Right thalamic ICH with multiple soft tissue contusions about scalp vertex and 1.6 cm nodular lesion at right temporal scalp. Bleed felt to be hypertensive in setting of eliquis and she was started on Cleveprex as SPB > 220 and eliquis reversed with Andexxa.  She was also found to have E coli UTI  and started on macrodantin-->ceftriaxone-->keflex for treatment. She was transferred to [facility] for management. CTA Head/neck showed no change in thalamic hemorrhage with mild surrounding edema, no LVO or AVM.  2D echo showed EF 60-65% with grade 1 DD.    She developed SOB with hypoxia on 01/20 and CXR done revealing chronic hypoventilation LLL with question of increase in left pleural effusion. CTA chest done and was negative for PE, cosolidative opacities LLL likely atelectasis without pleural effusion and incidental finding of enlarged aortic arch-3 cm with recs for yearly CTA or MRA for monitoring. Dr. Leonie Man recommends holding [facility] till appropriate to resume. Confusion resolving with improvement in activity and cleviprex weaned off with recs for SBP< 160. Marland Kitchen Klonopin resumed to help manage anxiety/sleep wake disruption. Has been weaned down to 2 L per [place].  She developed abdominal distension with diarrhea and KUB showed some concerns of gastric obstruction. Dr. Donne Hazel consulted and no work up indicated as patient without pain or N/V and benign exam. She continues to have left hemiplegia with sensory deficits, left body inattention,  worsening of baseline cognitive deficits with delay in processing and recall- SLUMS score 12/30 and working on balance at EOB. CIR recommended due  to functional decline.  Can be accommodated at ALF if requiring min assist at [facility] level   Review of Systems  Constitutional:  Positive for malaise/fatigue (for past 5 years). Negative for fever.  HENT:  Negative for hearing loss.   Eyes:  Positive for blurred vision (does not feel comfortable reading per family).  Respiratory:  Positive for shortness of breath. Negative for cough and stridor.   Cardiovascular:  Negative for chest pain.  Gastrointestinal:  Positive for abdominal pain and diarrhea (has issues due to anxiety). Negative for nausea.  Musculoskeletal:  Negative for myalgias.  Neurological:  Positive for speech change and weakness. Negative for dizziness and headaches.  Psychiatric/Behavioral:  Positive for depression (has good days and bad days.  Has weeks when she does not want to leave the room.). The patient does not have insomnia.     Past Medical History:  Diagnosis Date   Anxiety    Depression    Essential hypertension    Hyperlipidemia    PAF (paroxysmal atrial fibrillation) ([place])    a. 02/2020 Post-op Afib after R Femur FX and IM nail (also hypokalemic)-->Amio/Eliquis (CHA2DS2VASc = 4).    Past Surgical History:  Procedure Laterality Date   ABDOMINAL HYSTERECTOMY     BYPASS AXILLA/BRACHIAL ARTERY     CHOLECYSTECTOMY     COLONOSCOPY WITH PROPOFOL N/A 03/07/2018   Procedure: COLONOSCOPY WITH PROPOFOL;  Surgeon: Lucilla Lame, MD;  Location: [facility] ENDOSCOPY;  Service: Endoscopy;  Laterality: N/A;   INTRAMEDULLARY (IM) NAIL INTERTROCHANTERIC Right 03/10/2020   Procedure: INTRAMEDULLARY (IM) NAIL INTERTROCHANTRIC;  Surgeon: Thornton Park, MD;  Location: [facility]  [facility];  Service: Orthopedics;  Laterality: Right;    Family History  Problem Relation Age of Onset   Stroke Mother    Heart attack Father    Alcohol abuse Father    Stroke Sister    Diabetes Brother    Colon cancer Sister     Social History: Lives at [facility]F since hip/femur fracture 2  years ago. She was independent with walker PTA. Has had decline in mobility PTA and needed supervision with showers. She  reports that she has never smoked. She has never used smokeless tobacco. She reports that she does not drink alcohol and does not use drugs.   Allergies  Allergen Reactions   Penicillins Other (See Comments)    Has patient had a PCN reaction causing immediate rash, facial/tongue/throat swelling, SOB or lightheadedness with hypotension: Unknown Has patient had a PCN reaction causing severe rash involving mucus membranes or skin necrosis: Unknown Has patient had a PCN reaction that required hospitalization: Unknown Has patient had a PCN reaction occurring within the last 10 years: No If all of the above answers are "NO", then may proceed with Cephalosporin use.   Sulfa Antibiotics Other (See Comments)    Unknown reaction    No medications prior to admission.    Drug Regimen Review { DRUG REGIMEN [id number]  Home: Home Living Family/patient expects to be discharged to:: Assisted living Type of Home: Assisted living Home Equipment: Rolling Walker (2 wheels), Hand held shower head, Grab bars - tub/shower, Grab bars - toilet, Shower seat   Functional History: Prior Function Prior Level of Function : Needs assist Mobility Comments: uses RW for mobility. Recently has not been ambulating to dining hall. Daughter stating "she's been down in the dumps" ADLs Comments: Supervision with showers.  Daughter's assist with home management, facility provides meals and meds.  Functional Status:  Mobility: Bed Mobility Overal bed mobility: Needs Assistance Bed Mobility: Rolling, Sit to Supine Rolling: Max assist Sidelying to sit: Max assist, Mod assist, HOB elevated Supine to sit: Max assist Sit to supine: Total assist General bed mobility comments: directional verbal cues, minA to achieve full log roll, maxA for trunk elevation, pt with improved effort to use R LE in  attempt to bring L LE off EOB Transfers Overall transfer level: Needs assistance Equipment used: 2 person hand held assist (face to face with left knee blocked) Transfers: Sit to/from Stand, Bed to chair/wheelchair/BSC Sit to Stand: +2 physical assistance, Max assist Bed to/from chair/wheelchair/BSC transfer type:: Stand pivot Stand pivot transfers: Max assist, +2 physical assistance, +2 safety/equipment Step pivot transfers: Max assist, +2 physical assistance General transfer comment: Patient can bear weight and move her right leg a little and needs help managing her left leg during stand pivot transfer. Ambulation/Gait General Gait Details: unable at this time    ADL: ADL Overall ADL's : Needs assistance/impaired Eating/Feeding: Set up Grooming: Wash/dry hands, Wash/dry face, Brushing hair, Sitting, Moderate assistance Grooming Details (indicate cue type and reason): unable to use LUE to assist with self care tasks Upper Body Bathing: Moderate assistance, Sitting Lower Body Bathing: Total assistance, Bed level Upper Body Dressing : Moderate assistance, Sitting Upper Body Dressing Details (indicate cue type and reason): verbal cues to attend to left, changed gown Lower Body Dressing: Total assistance, +2 for physical assistance, Sit to/from stand Toilet Transfer: Maximal assistance, +2 for physical assistance, +2 for safety/equipment, Stand-pivot, BSC/3in1 Toilet Transfer Details (indicate cue type and reason): good push up from R side Toileting-  Clothing Manipulation and Hygiene: Total assistance, +2 for physical assistance, +2 for safety/equipment, Sit to/from stand Toileting - Clothing Manipulation Details (indicate cue type and reason): Pt able to maintain standing for significant timeframe for 3rd person to complete peri care Functional mobility during ADLs: Maximal assistance, +2 for physical assistance, +2 for safety/equipment (SPT face to face 2 person) General ADL Comments:  grooming and gown change performed seated on EOB with assistance of another for sitting balance  Cognition: Cognition Overall Cognitive Status: History of cognitive impairments - at baseline Arousal/Alertness: Awake/alert Orientation Level: Oriented to person, Oriented to place Year:  (2030) Month: January Day of Week: Incorrect Attention: Focused, Sustained Focused Attention: Appears intact Sustained Attention: Appears intact Memory: Impaired Memory Impairment: Decreased recall of new information, Decreased short term memory, Retrieval deficit (Immediate: 5/5 with multiple repetitions; delayed: 1/5; 4/4 with cues) Awareness: Appears intact Problem Solving: Appears intact Executive Function: Sequencing, Technical brewer: Impaired Sequencing Impairment: Verbal complex (clock drawing: 2/4; pt stated she did not know what to do after inserting numbers) Organizing: Impaired Organizing Impairment: Verbal complex (backward digit span: 0/2) Cognition Arousal/Alertness: Awake/alert Behavior During Therapy: Flat affect Overall Cognitive Status: History of cognitive impairments - at baseline General Comments: delayed response time Difficult to assess due to: Hard of hearing/deaf   Blood pressure (!) 154/64, pulse 69, temperature 97.9 F (36.6 C), temperature source Oral, resp. rate 20, SpO2 97 %. Physical Exam Vitals and nursing note reviewed.  HENT:     Head:     Comments: Multiple dry scabs left temple due to healing abrasions.  Abdominal:     General: Abdomen is flat. There is no distension.     Tenderness: There is abdominal tenderness.  Neurological:     Mental Status: She is alert and oriented to person, place, and time.     Sensory: Sensory deficit present.     Comments: Right gaze preference with left inattention. Left hemiplegia. Soft speech with mild dysarthria.     No results found for this or any previous visit (from the past 48 hour(s)).  DG Abd Portable  1V  Result Date: 07/14/2021 CLINICAL DATA:  Abdominal pain. EXAM: PORTABLE ABDOMEN - 1 VIEW COMPARISON:  None. FINDINGS: The stomach is dilated and air-filled. No other dilated bowel loops are seen. Air seen to the level of the rectum. There is a large amount of stool throughout the colon. No suspicious calcifications. There is scoliosis of the thoracolumbar spine. Sternotomy wires, cholecystectomy clips and right hip screw are present. IMPRESSION: 1. The stomach is dilated and air-filled. Please correlate clinically. Findings may related to gastric outlet obstruction, gastroparesis or other etiology. Electronically Signed   By: Ronney Asters M.D.   On: 07/14/2021 19:12       Medical Problem List and Plan: 1. Functional deficits secondary to ***  -patient may *** shower  -ELOS/Goals: *** 2.  Antithrombotics: -DVT/anticoagulation:  Pharmaceutical: Lovenox  -antiplatelet therapy: N/a due to thalamic bleed.  3. Pain Management: Tylenol or tramadol prn.  4. Mood: LCSW to follow for evaluation and support when appropriate.   -antipsychotic agents: N/A 5. Neuropsych: This patient is not fully capable of making decisions on her own behalf. 6. Skin/Wound Care: Routine pressure relief measures.  7. Fluids/Electrolytes/Nutrition: Monitor I/O. Check CMET in am.  --offer supplements between meals due to low calorie malnutrition (TP-5.6/alb-2.1)  8. A fib: Monitor HR TID--continue amiodarone and metoprolol.   --Off Eliquis till follow up with neurology.  9. HTN: Monitor BP TID--SBP goal,  160. On metoprolol 25 mg bid with amiodarone 200 mg/day 10. Hypoxia: Continue 2 L per [place] and wean oxygen as able.   --encourage IS/Flutter valve.  11. E coli UTI: Completed 5 day course of antibiotic on 01/22.  12. GAD/depression: Manage with Lexapro and Klonopin BID (took QID at home?) per Dr. Elder Chelsey Redondo.  --sleep wake chart.  13. ABLA: Recheck CBC in am.       ***  Bary Leriche, PA-C 07/15/2021

## 2021-07-12 NOTE — Discharge Summary (Signed)
[facility]  Physician Discharge Summary   Patient ID: SASHEEN LAGRASSA MRN: [id number] DOB/AGE: 80-May-1942 80 y.o.  Admit date: 07/06/2021 Discharge date:   07/12/2021   PCP: Kirk Ruths, MD  DISCHARGE DIAGNOSES:  Principal Problem:   Intracranial hemorrhage ([place]) Active Problems:   Stroke (cerebrum) (HCC)   Paroxysmal A-fib ([place])   Hypoxia   Urinary tract infection   Essential hypertension   Generalized anxiety disorder   Normocytic anemia   RECOMMENDATIONS FOR OUTPATIENT FOLLOW UP: CBC and basic metabolic panel to be checked in a few days   Home Health: Inpatient rehabilitation versus skilled nursing facility Equipment/Devices: None  CODE STATUS: DNR  DISCHARGE CONDITION: fair  Diet recommendation: Heart healthy  INITIAL HISTORY: 81 y.o. female with a history of atrial fibrillation who takes Eliquis at baseline she was noted to be confused with left-sided weakness around 1:30 AM on the day of admission and presented to the [facility] as a code stroke. Her BP was 158 SBP on arrival. There, a head CT revealed a thalamic IPH and she has therefore been transferred to [facility] for further management.  Also noted to have a UTI.  Patient was noted to be hypoxemic on the morning of 1/20.  Did not appear to be particularly symptomatic.  However required high amounts of oxygen.  This prompted chest x-ray followed by CT angiogram which did not show any PE.  Showed atelectasis.  No significant pleural effusion was noted.   Patient has been stable for the last several days.  Oxygen has been weaned down to 3 L/min.  Evaluated by PT and OT and now they think patient is a candidate for inpatient rehabilitation  Consultants: Neurology   Procedures: Transthoracic echocardiogram   HOSPITAL COURSE:    * Intracranial hemorrhage ([place]) CT scan showed acute intraparenchymal hemorrhage in the right thalamus.  Patient was admitted to the stroke service.    LDL 103.  Not noted to be on statin currently. HbA1c 5.3.   Echocardiogram shows normal systolic function.  Grade 1 diastolic dysfunction is noted.  No significant valvular abnormalities were appreciated. Currently off of all antiplatelet agents as well as anticoagulation. Seen by PT and OT.  Initially SNF was recommended.  Now CIR is recommended. Neurological status is stable though she continues to have significant left-sided weakness.  Paroxysmal A-fib ([place])- (present on admission) Noted to be on amiodarone, metoprolol and Eliquis prior to admission. Eliquis on hold due to intraparenchymal hemorrhage.   Remains on metoprolol and amiodarone.  Monitor on telemetry.  Hypoxia Was noted to be hypoxic on the morning of 1/28.   Chest x-ray suggested left-sided pleural effusion.  Patient subsequently underwent CT angiogram not show any pulmonary embolism.  No pleural effusion was noted.  Atelectasis was present.  Started on incentive spirometer.  Oxygenation has improved.  Continue to wean down oxygen.  Now down to 2 L/min.  Saturations in the 90s.  Urinary tract infection- (present on admission) Urine culture growing E. coli sensitive to ceftriaxone and cefazolin.  She was transitioned to Keflex.  She has completed a 5-day course.  Essential hypertension- (present on admission) Goal blood pressure should be less than 0000000 systolic per neurology.  Remains on metoprolol.  Blood pressure is reasonably well controlled.  Occasional high readings are noted.  Stable for the most part.  Generalized anxiety disorder- (present on admission) Continue Lexapro and Klonopin.  Normocytic anemia- (present on admission) Hemoglobin is low but stable.  No evidence of  overt blood loss.  Monitor periodically.      Patient is stable for discharge.   PERTINENT LABS:  The results of significant diagnostics from this hospitalization (including imaging, microbiology, ancillary and laboratory) are listed  below for reference.    Microbiology: Recent Results (from the past 240 hour(s))  Resp Panel by RT-PCR (Flu A&B, Covid) Nasopharyngeal Swab     Status: None   Collection Time: 07/06/21  4:06 AM   Specimen: Nasopharyngeal Swab; Nasopharyngeal(NP) swabs in vial transport medium  Result Value Ref Range Status   SARS Coronavirus 2 by RT PCR NEGATIVE NEGATIVE Final    Comment: (NOTE) SARS-CoV-2 target nucleic acids are NOT DETECTED.  The SARS-CoV-2 RNA is generally detectable in upper respiratory specimens during the acute phase of infection. The lowest concentration of SARS-CoV-2 viral copies this assay can detect is 138 copies/mL. A negative result does not preclude SARS-Cov-2 infection and should not be used as the sole basis for treatment or other patient management decisions. A negative result may occur with  improper specimen collection/handling, submission of specimen other than nasopharyngeal swab, presence of viral mutation(s) within the areas targeted by this assay, and inadequate number of viral copies(<138 copies/mL). A negative result must be combined with clinical observations, patient history, and epidemiological information. The expected result is Negative.  Fact Sheet for Patients:  EntrepreneurPulse.com.au  Fact Sheet for Healthcare Providers:  IncredibleEmployment.be  This test is no t yet approved or cleared by the [place] FDA and  has been authorized for detection and/or diagnosis of SARS-CoV-2 by FDA under an Emergency Use Authorization (EUA). This EUA will remain  in effect (meaning this test can be used) for the duration of the COVID-19 declaration under Section 564(b)(1) of the Act, 21 U.S.C.section 360bbb-3(b)(1), unless the authorization is terminated  or revoked sooner.       Influenza A by PCR NEGATIVE NEGATIVE Final   Influenza B by PCR NEGATIVE NEGATIVE Final    Comment: (NOTE) The Xpert Xpress  SARS-CoV-2/FLU/RSV plus assay is intended as an aid in the diagnosis of influenza from Nasopharyngeal swab specimens and should not be used as a sole basis for treatment. Nasal washings and aspirates are unacceptable for Xpert Xpress SARS-CoV-2/FLU/RSV testing.  Fact Sheet for Patients: EntrepreneurPulse.com.au  Fact Sheet for Healthcare Providers: IncredibleEmployment.be  This test is not yet approved or cleared by the [place] FDA and has been authorized for detection and/or diagnosis of SARS-CoV-2 by FDA under an Emergency Use Authorization (EUA). This EUA will remain in effect (meaning this test can be used) for the duration of the COVID-19 declaration under Section 564(b)(1) of the Act, 21 U.S.C. section 360bbb-3(b)(1), unless the authorization is terminated or revoked.  Performed at [facility], [address] [place], [place] [address]   MRSA Next Gen by PCR, Nasal     Status: None   Collection Time: 07/06/21  6:41 AM   Specimen: Urine, Catheterized; Nasal Swab  Result Value Ref Range Status   MRSA by PCR Next Gen NOT DETECTED NOT DETECTED Final    Comment: (NOTE) The GeneXpert MRSA Assay (FDA approved for NASAL specimens only), is one component of a comprehensive MRSA colonization surveillance program. It is not intended to diagnose MRSA infection nor to guide or monitor treatment for MRSA infections. Test performance is not FDA approved in patients less than 58 years old. Performed at [facility], [place] [address] [place], [place] [address]   Urine Culture     Status:  Abnormal   Collection Time: 07/06/21  8:19 AM   Specimen: Urine, Clean Catch  Result Value Ref Range Status   Specimen Description URINE, CLEAN CATCH  Final   Special Requests   Final    NONE Performed at [facility], [address], [place], [place] [address]    Culture >=100,000 COLONIES/mL ESCHERICHIA COLI (A)  Final   Report  Status 07/08/2021 FINAL  Final   Organism ID, Bacteria ESCHERICHIA COLI (A)  Final      Susceptibility   Escherichia coli - MIC*    AMPICILLIN >=32 RESISTANT Resistant     CEFAZOLIN <=4 SENSITIVE Sensitive     CEFEPIME <=0.12 SENSITIVE Sensitive     CEFTRIAXONE <=0.25 SENSITIVE Sensitive     CIPROFLOXACIN <=0.25 SENSITIVE Sensitive     GENTAMICIN <=1 SENSITIVE Sensitive     IMIPENEM <=0.25 SENSITIVE Sensitive     NITROFURANTOIN <=16 SENSITIVE Sensitive     TRIMETH/SULFA <=20 SENSITIVE Sensitive     AMPICILLIN/SULBACTAM 16 INTERMEDIATE Intermediate     PIP/TAZO <=4 SENSITIVE Sensitive     * >=100,000 COLONIES/mL ESCHERICHIA COLI     Labs:  COVID-19 Labs    Lab Results  Component Value Date   SARSCOV2NAA NEGATIVE 07/06/2021   [place] NEGATIVE 03/17/2020   [place] NEGATIVE 03/09/2020      Basic Metabolic Panel: Recent Labs  Lab 07/07/21 0340 07/08/21 0802 07/09/21 0250 07/10/21 0156 07/11/21 0338  NA 138 138 137 136 135  K 3.5 3.6 4.0 3.9 3.5  CL 103 104 104 102 101  CO2 24 24 26 26 23   GLUCOSE 117* 100* 100* 105* 102*  BUN 22 18 18 17 15   CREATININE 1.04* 0.88 0.83 0.81 0.65  CALCIUM 8.8* 8.9 8.6* 8.6* 8.6*   Liver Function Tests: Recent Labs  Lab 07/06/21 0406  AST 21  ALT 12  ALKPHOS 103  BILITOT 0.6  PROT 7.2  ALBUMIN 3.2*    CBC: Recent Labs  Lab 07/06/21 0406 07/07/21 0340 07/09/21 0250  WBC 12.0* 8.1 8.8  NEUTROABS 9.1*  --   --   HGB 12.5 11.7* 11.3*  HCT 39.5 36.8 35.9*  MCV 94.3 92.9 93.7  PLT 221 229 212     CBG: Recent Labs  Lab 07/10/21 0611 07/10/21 1230 07/10/21 1555 07/10/21 2111 07/11/21 0615  GLUCAP 100* 136* 122* 126* 103*     IMAGING STUDIES CT ANGIO HEAD NECK W WO CM  Result Date: 07/06/2021 CLINICAL DATA:  Neuro deficit, stroke suspected EXAM: CT ANGIOGRAPHY HEAD AND NECK TECHNIQUE: Multidetector CT imaging of the head and neck was performed using the standard protocol during bolus administration of  intravenous contrast. Multiplanar CT image reconstructions and MIPs were obtained to evaluate the vascular anatomy. Carotid stenosis measurements (when applicable) are obtained utilizing NASCET criteria, using the distal internal carotid diameter as the denominator. RADIATION DOSE REDUCTION: This exam was performed according to the departmental dose-optimization program which includes automated exposure control, adjustment of the mA and/or kV according to patient size and/or use of iterative reconstruction technique. CONTRAST:  71mL OMNIPAQUE IOHEXOL 350 MG/ML SOLN COMPARISON:  No prior CTA, correlation is made with 07/06/2021 CT FINDINGS: CT HEAD FINDINGS Evaluation is somewhat limited by motion artifact. Brain: Redemonstrated acute parenchymal hemorrhage measuring 1.8 x 1.5 x 3.2 cm, centered in the right thalamus, unchanged from the prior exam. There is mild surrounding vasogenic edema without significant regional mass effect. No evidence of intraventricular extension. No new acute hemorrhage. No acute infarct, mass, mass  effect, or midline shift. No hydrocephalus or extra-axial collection. Vascular: Please see CTA findings below Skull: Previously noted right temporal scalp lesion is not well seen on the current exam. No acute osseous abnormality. Hyperostosis frontalis. Sinuses: Imaged portions are clear. Orbits: Status post bilateral lens replacements. Review of the MIP images confirms the above findings CTA NECK FINDINGS Aortic arch: Standard branching. Imaged portion shows no evidence of aneurysm or dissection. No significant stenosis of the major arch vessel origins. Aortic atherosclerosis. Calcifications are noted in the proximal left subclavian artery, which do not appear hemodynamically significant. Right carotid system: No evidence of dissection, stenosis (50% or greater) or occlusion. Left carotid system: No evidence of dissection, stenosis (50% or greater) or occlusion. Tortuosity, with extreme lateral  course of the left internal carotid artery (series 10, image 188). Vertebral arteries: No evidence of dissection, stenosis (50% or greater) or occlusion. Skeleton: Degenerative changes in the cervical spine, worst at C5-C6 and C6-C7. No acute osseous abnormality. Status post median sternotomy. Other neck: Negative. Upper chest: No focal pulmonary opacity or pleural effusion. Review of the MIP images confirms the above findings CTA HEAD FINDINGS Anterior circulation: Both internal carotid arteries are patent to the termini, with moderate circumferential calcifications, which cause mild stenosis. A1 segments patent. Normal anterior communicating artery. Anterior cerebral arteries are patent to their distal aspects. No M1 stenosis or occlusion. Normal MCA bifurcations. Distal MCA branches perfused and symmetric. No evidence of active extravasation into the right thalamic hemorrhage. Posterior circulation: Vertebral arteries patent to the vertebrobasilar junction without stenosis. Posterior inferior cerebral arteries patent bilaterally. Basilar patent to its distal aspect. Superior cerebellar arteries patent bilaterally. Bilateral P1 segments originate from the basilar artery. PCAs perfused to their distal aspects without stenosis. The bilateral posterior communicating arteries are not visualized. Venous sinuses: As permitted by contrast timing, patent. Anatomic variants: None significant Review of the MIP images confirms the above findings IMPRESSION: 1. Unchanged appearance of the previously noted acute intraparenchymal hemorrhage in the right thalamus, with unchanged surrounding vasogenic edema but without significant regional mass effect. 2.  No hemodynamically significant stenosis in the neck. 3. No intracranial large vessel occlusion or significant stenosis. No evidence of active extravasation into the right thalamic hemorrhage. Electronically Signed   By: Merilyn Baba M.D.   On: 07/06/2021 19:37   CT Angio  Chest Pulmonary Embolism (PE) W or WO Contrast  Result Date: 07/08/2021 CLINICAL DATA:  Possible pleural effusion on chest x-ray, concern for PE EXAM: CT ANGIOGRAPHY CHEST WITH CONTRAST TECHNIQUE: Multidetector CT imaging of the chest was performed using the standard protocol during bolus administration of intravenous contrast. Multiplanar CT image reconstructions and MIPs were obtained to evaluate the vascular anatomy. RADIATION DOSE REDUCTION: This exam was performed according to the departmental dose-optimization program which includes automated exposure control, adjustment of the mA and/or kV according to patient size and/or use of iterative reconstruction technique. CONTRAST:  65mL OMNIPAQUE IOHEXOL 350 MG/ML SOLN COMPARISON:  Same-day chest radiograph FINDINGS: Cardiovascular: Is adequate opacification of the pulmonary arteries to the segmental level. There is no evidence of pulmonary embolism. Heart is enlarged. There is no pericardial effusion. The patient is status post CABG. There is scattered calcification throughout the native coronary arteries. There is calcified atherosclerotic plaque in the thoracic aorta.The aortic arch is borderline enlarged at 3.0 cm. Mediastinum/Nodes: The thyroid is unremarkable. Esophagus is grossly unremarkable. There is no mediastinal, hilar, or axillary lymphadenopathy. Lungs/Pleura: The trachea and central airways are patent. There is consolidative opacity in  the posterior segment of the left upper lobe. There are additional linear opacities in the bilateral lung bases likely reflecting atelectasis. There is additional subsegmental atelectasis in the medial right lower lobe along the scoliotic spine. There is no other focal consolidation. There is no pulmonary edema. There is no pleural effusion or pneumothorax. Upper Abdomen: No acute findings. Musculoskeletal: There is marked dextrocurvature of the spine which distorts the thoracic cavity. There is no acute osseous  abnormality or suspicious osseous lesion. Review of the MIP images confirms the above findings. IMPRESSION: 1. No evidence of pulmonary embolism. 2. Consolidative opacities in the left lower lobe and lung bases most likely reflect atelectasis. No pleural effusion as was questioned on the prior radiograph. 3. Borderline enlarged aortic arch measuring 3.0 cm. Recommend annual imaging followup by CTA or MRA. This recommendation follows 2010 ACCF/AHA/AATS/ACR/ASA/SCA/SCAI/SIR/STS/SVM Guidelines for the Diagnosis and Management of Patients with Thoracic Aortic Disease. Circulation.2010; [id number]. Aortic aneurysm NOS (ICD10-I71.9) Aortic Atherosclerosis (ICD10-I70.0). Electronically Signed   By: Valetta Mole M.D.   On: 07/08/2021 10:10   DG Chest Port 1 View  Result Date: 07/08/2021 CLINICAL DATA:  81 year old female with right thalamic hemorrhage. Hypoxia. EXAM: PORTABLE CHEST 1 VIEW COMPARISON:  Portable chest 03/16/2020 and earlier. FINDINGS: Portable AP semi upright view at 0849 hours. Moderate to severe scoliosis. Chronic hypo ventilation at the left lung base in part related to cardiomegaly and mediastinal lipomatosis, but evidence of pleural effusion on the left in 2021 which may have increased. No superimposed pneumothorax or pulmonary edema. Prior CABG. Stable cardiac size and mediastinal contours. Visualized tracheal air column is within normal limits. IMPRESSION: 1. Chronic hypoventilation at the left lung base, but with superimposed left pleural effusion in 2021 which may have increased. Recommend chest ultrasound or CT to confirm the presence of drainable fluid prior to any attempted thoracentesis. 2. Otherwise stable cardiomegaly, scoliosis. Electronically Signed   By: Genevie Ann M.D.   On: 07/08/2021 09:02   ECHOCARDIOGRAM COMPLETE  Result Date: 07/06/2021    ECHOCARDIOGRAM REPORT   Patient Name:   Michelle Bauer Date of Exam: 07/06/2021 Medical Rec #:  [id number]      Height:       63.0 in  Accession #:    [id number]     Weight:       148.1 lb Date of Birth:  [birth year]     BSA:          1.702 m Patient Age:    63 years       BP:           146/56 mmHg Patient Gender: F              HR:           69 bpm. Exam Location:  Inpatient Procedure: 2D Echo, Cardiac Doppler, Color Doppler and Intracardiac            Opacification Agent Indications:    STROKE  History:        Patient has prior history of Echocardiogram examinations, most                 recent 03/13/2020. Risk Factors:Hypertension. HLD/ PAF.  Sonographer:    Beryle Beams Referring Phys: Merlín.Osler MCNEILL P Erin  1. Left ventricular ejection fraction, by estimation, is 55 to 60%. The left ventricle has normal function. The left ventricle has no regional wall motion abnormalities. Left ventricular diastolic parameters are consistent with Grade I diastolic dysfunction (impaired  relaxation).  2. Right ventricular systolic function is normal. The right ventricular size is normal.  3. Left atrial size was severely dilated.  4. The mitral valve is normal in structure. No evidence of mitral valve regurgitation. No evidence of mitral stenosis.  5. The aortic valve is normal in structure. There is mild calcification of the aortic valve. There is mild thickening of the aortic valve. Aortic valve regurgitation is mild. Aortic valve sclerosis/calcification is present, without any evidence of aortic stenosis. Aortic valve area, by VTI measures 1.99 cm. Aortic valve mean gradient measures 5.0 mmHg. Aortic valve Vmax measures 1.48 m/s.  6. The inferior vena cava is normal in size with greater than 50% respiratory variability, suggesting right atrial pressure of 3 mmHg. FINDINGS  Left Ventricle: Left ventricular ejection fraction, by estimation, is 55 to 60%. The left ventricle has normal function. The left ventricle has no regional wall motion abnormalities. The left ventricular internal cavity size was normal in size. There is  no left  ventricular hypertrophy. Left ventricular diastolic parameters are consistent with Grade I diastolic dysfunction (impaired relaxation). Right Ventricle: IVC NOT SEEN. The right ventricular size is normal. No increase in right ventricular wall thickness. Right ventricular systolic function is normal. Left Atrium: Left atrial size was severely dilated. Right Atrium: Right atrial size was normal in size. Pericardium: There is no evidence of pericardial effusion. Mitral Valve: The mitral valve is normal in structure. No evidence of mitral valve regurgitation. No evidence of mitral valve stenosis. MV peak gradient, 5.0 mmHg. The mean mitral valve gradient is 3.0 mmHg. Tricuspid Valve: The tricuspid valve is normal in structure. Tricuspid valve regurgitation is trivial. No evidence of tricuspid stenosis. Aortic Valve: The aortic valve is normal in structure. There is mild calcification of the aortic valve. There is mild thickening of the aortic valve. Aortic valve regurgitation is mild. Aortic valve sclerosis/calcification is present, without any evidence of aortic stenosis. Aortic valve mean gradient measures 5.0 mmHg. Aortic valve peak gradient measures 8.8 mmHg. Aortic valve area, by VTI measures 1.99 cm. Pulmonic Valve: The pulmonic valve was normal in structure. Pulmonic valve regurgitation is not visualized. No evidence of pulmonic stenosis. Aorta: The aortic root is normal in size and structure. Venous: The inferior vena cava is normal in size with greater than 50% respiratory variability, suggesting right atrial pressure of 3 mmHg. IAS/Shunts: No atrial level shunt detected by color flow Doppler.  LEFT VENTRICLE PLAX 2D LVIDd:         2.60 cm     Diastology LVIDs:         1.70 cm     LV e' medial:    5.55 cm/s LV PW:         1.10 cm     LV E/e' medial:  17.4 LV IVS:        0.80 cm     LV e' lateral:   9.02 cm/s LVOT diam:     2.00 cm     LV E/e' lateral: 10.7 LV SV:         63 LV SV Index:   37 LVOT Area:      3.14 cm  LV Volumes (MOD) LV vol d, MOD A2C: 42.6 ml LV vol d, MOD A4C: 78.2 ml LV vol s, MOD A2C: 15.6 ml LV vol s, MOD A4C: 36.1 ml LV SV MOD A2C:     27.0 ml LV SV MOD A4C:     78.2 ml LV SV MOD BP:  35.8 ml RIGHT VENTRICLE RV S prime:     13.30 cm/s TAPSE (M-mode): 1.8 cm LEFT ATRIUM             Index        RIGHT ATRIUM           Index LA diam:        3.80 cm 2.23 cm/m   RA Area:     16.60 cm LA Vol (A2C):   38.9 ml 22.85 ml/m  RA Volume:   36.50 ml  21.44 ml/m LA Vol (A4C):   81.6 ml 47.94 ml/m LA Biplane Vol: 60.3 ml 35.43 ml/m  AORTIC VALVE                     PULMONIC VALVE AV Area (Vmax):    2.08 cm      PV Vmax:       1.06 m/s AV Area (Vmean):   2.00 cm      PV Vmean:      69.800 cm/s AV Area (VTI):     1.99 cm      PV VTI:        0.166 m AV Vmax:           148.00 cm/s   PV Peak grad:  4.5 mmHg AV Vmean:          106.000 cm/s  PV Mean grad:  2.0 mmHg AV VTI:            0.318 m AV Peak Grad:      8.8 mmHg AV Mean Grad:      5.0 mmHg LVOT Vmax:         97.90 cm/s LVOT Vmean:        67.500 cm/s LVOT VTI:          0.201 m LVOT/AV VTI ratio: 0.63  AORTA Ao Root diam: 3.20 cm Ao Asc diam:  2.80 cm MITRAL VALVE                TRICUSPID VALVE MV Area (PHT): 1.95 cm     TR Peak grad:   28.5 mmHg MV Area VTI:   2.51 cm     TR Vmax:        267.00 cm/s MV Peak grad:  5.0 mmHg MV Mean grad:  3.0 mmHg     SHUNTS MV Vmax:       1.12 m/s     Systemic VTI:  0.20 m MV Vmean:      83.4 cm/s    Systemic Diam: 2.00 cm MV Decel Time: 389 msec MV E velocity: 96.80 cm/s MV A velocity: 118.00 cm/s MV E/A ratio:  0.82 Skeet Latch MD Electronically signed by Skeet Latch MD Signature Date/Time: 07/06/2021/3:23:54 PM    Final    CT HEAD CODE STROKE WO CONTRAST  Result Date: 07/06/2021 CLINICAL DATA:  Code stroke.  Initial evaluation for acute stroke. EXAM: CT HEAD WITHOUT CONTRAST TECHNIQUE: Contiguous axial images were obtained from the base of the skull through the vertex without intravenous contrast.  RADIATION DOSE REDUCTION: This exam was performed according to the departmental dose-optimization program which includes automated exposure control, adjustment of the mA and/or kV according to patient size and/or use of iterative reconstruction technique. COMPARISON:  Head CT from 03/09/2020. FINDINGS: Brain: Acute intraparenchymal hemorrhage measuring 1.8 x 1.5 x 3.2 cm seen centered at the right thalamus (estimated volume 4 mL). Surrounding vasogenic edema without significant regional mass effect. No intraventricular  extension. No other acute intracranial hemorrhage. No other large vessel territory infarct. Atrophy with chronic microvascular ischemic disease noted. No mass lesion or midline shift. No hydrocephalus or extra-axial fluid collection. Vascular: No hyperdense vessel. Calcified atherosclerosis present at skull base. Skull: 1.6 cm nodular soft tissue lesion noted at the right temporal scalp, indeterminate. Possible multifocal soft tissue contusions about the scalp vertex. Calvarium intact. Sinuses/Orbits: Globes and orbital soft tissues demonstrate no acute finding. Paranasal sinuses and mastoid air cells are clear. Other: None. ASPECTS [facility] Stroke Program Early CT Score) Acute intracranial hemorrhage. IMPRESSION: 1. 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 Surrounding vasogenic edema without significant regional mass effect. A hypertensive etiology is suspected. 2. Underlying atrophy with chronic small vessel ischemic disease. 3. Multifocal soft tissue contusions about the scalp vertex. Calvarium intact. 4. 1.6 cm nodular soft tissue lesion at the right temporal scalp, indeterminate. Correlation with physical exam recommended. Critical Value/emergent results were called by telephone at the time of interpretation on 07/06/2021 at 3:35 am to provider [facility] , who verbally acknowledged these results. Electronically Signed   By:  Jeannine Boga M.D.   On: 07/06/2021 03:38    DISCHARGE EXAMINATION: Vitals:   07/11/21 1958 07/11/21 2346 07/12/21 0330 07/12/21 0736  BP: (!) 143/72 137/65 (!) 152/60 (!) 158/67  Pulse: 74 63 64 65  Resp: 16 15 19 16   Temp: 99.1 F (37.3 C) 97.7 F (36.5 C) 98.1 F (36.7 C) 98 F (36.7 C)  TempSrc: Oral Oral Oral Oral  SpO2: 94% 94% 97% 93%   General appearance: Awake alert.  In no distress Resp: Clear to auscultation bilaterally.  Normal effort Cardio: S1-S2 is normal regular.  No S3-S4.  No rubs murmurs or bruit GI: Abdomen is soft.  Nontender nondistended.  Bowel sounds are present normal.  No masses organomegaly Extremities: No edema.  Full range of motion of lower extremities. Neurologic: Left-sided hemiparesis noted.   DISPOSITION: Inpatient rehabilitation versus skilled nursing facility  Discharge Instructions     Ambulatory referral to Neurology   Complete by: As directed    Follow up with Dr. Leonie Man at [facility] in 4 weeks. Need to follow up with Dr. Leonie Man to consider clinical trials. Thanks.   Call MD for:  difficulty breathing, headache or visual disturbances   Complete by: As directed    Call MD for:  extreme fatigue   Complete by: As directed    Call MD for:  persistant dizziness or light-headedness   Complete by: As directed    Call MD for:  persistant nausea and vomiting   Complete by: As directed    Call MD for:  severe uncontrolled pain   Complete by: As directed    Call MD for:  temperature >100.4   Complete by: As directed    Diet - low sodium heart healthy   Complete by: As directed    Discharge instructions   Complete by: As directed    Please review instructions on the discharge summary  You were cared for by a hospitalist during your hospital stay. If you have any questions about your discharge medications or the care you received while you were in the hospital after you are discharged, you can call the unit and asked to speak with the  hospitalist on call if the hospitalist that took care of you is not available. Once you are discharged, your primary care physician will handle any further medical  issues. Please note that NO REFILLS for any discharge medications will be authorized once you are discharged, as it is imperative that you return to your primary care physician (or establish a relationship with a primary care physician if you do not have one) for your aftercare needs so that they can reassess your need for medications and monitor your lab values. If you do not have a primary care physician, you can call [phone] for a physician referral.   Increase activity slowly   Complete by: As directed    No wound care   Complete by: As directed           Allergies as of 07/12/2021       Reactions   Penicillins Other (See Comments)   Has patient had a PCN reaction causing immediate rash, facial/tongue/throat swelling, SOB or lightheadedness with hypotension: Unknown Has patient had a PCN reaction causing severe rash involving mucus membranes or skin necrosis: Unknown Has patient had a PCN reaction that required hospitalization: Unknown Has patient had a PCN reaction occurring within the last 10 years: No If all of the above answers are "NO", then may proceed with Cephalosporin use.   Sulfa Antibiotics Other (See Comments)   Unknown reaction        Medication List     STOP taking these medications    apixaban 5 MG Tabs tablet Commonly known as: ELIQUIS   Potassium Gluconate 550 MG Tabs       TAKE these medications    acetaminophen 500 MG tablet Commonly known as: TYLENOL Take 2 tablets (1,000 mg total) by mouth every 6 (six) hours as needed for mild pain. Scheduled 8am 12pm 5pm What changed:  when to take this reasons to take this   amiodarone 200 MG tablet Commonly known as: PACERONE Take 1 tablet (200 mg total) by mouth daily. What changed: when to take this   cholecalciferol 25 MCG (1000 UNIT)  tablet Commonly known as: VITAMIN D3 Take 2,000 Units by mouth every morning.   clonazePAM 0.5 MG tablet Commonly known as: KLONOPIN Take 1 tablet (0.5 mg total) by mouth 2 (two) times daily. What changed:  when to take this additional instructions   clonazePAM 0.5 MG tablet Commonly known as: KLONOPIN Take 1 tablet (0.5 mg total) by mouth 2 (two) times daily as needed (agitation, anxiety). What changed: You were already taking a medication with the same name, and this prescription was added. Make sure you understand how and when to take each.   escitalopram 5 MG tablet Commonly known as: LEXAPRO TAKE 1 TABLET(5 MG) BY MOUTH DAILY What changed:  how much to take how to take this when to take this additional instructions   FISH OIL PO Take 1 capsule by mouth daily with lunch.   metoprolol tartrate 25 MG tablet Commonly known as: LOPRESSOR Take 1 tablet (25 mg total) by mouth 2 (two) times daily. What changed: how much to take   multivitamin with minerals Tabs tablet Take 1 tablet by mouth every morning. Women's 50 +   pantoprazole 40 MG tablet Commonly known as: PROTONIX Take 1 tablet (40 mg total) by mouth at bedtime.   polyethylene glycol 17 g packet Commonly known as: MIRALAX / GLYCOLAX Take 17 g by mouth daily.   senna-docusate 8.6-50 MG tablet Commonly known as: Senokot-S Take 1 tablet by mouth daily as needed for mild constipation or moderate constipation.   traMADol 50 MG tablet Commonly known as: ULTRAM Take 1 tablet (50  mg total) by mouth every 6 (six) hours as needed for moderate pain. What changed:  how much to take when to take this reasons to take this   vitamin B-12 500 MCG tablet Commonly known as: CYANOCOBALAMIN Take 1,000 mcg by mouth every morning.          Follow-up Information     Garvin Fila, MD. Schedule an appointment as soon as possible for a visit in 1 month(s).   Specialties: Neurology, Radiology Why: stroke  clinic Contact information: [address] [place] [address] [phone]         Kirk Ruths, MD. Schedule an appointment as soon as possible for a visit in 3 week(s).   Specialty: Internal Medicine Contact information: [place] [address] [phone]                 TOTAL DISCHARGE TIME: 41 minutes  [place]  [facility] Pager on [email or web address]  07/12/2021, 10:38 AM

## 2021-07-12 NOTE — Progress Notes (Signed)
Inpatient Rehab Admissions Coordinator:   Discussed pt with Dr. Riley Kill who agrees that pt may be appropriate for a CIR admission for reduce burden of care.  Discussed with patient and her daughter at bedside.  Pt was mod I with RW prior to this admission.  I discussed that, per TOC convo with ALF, it appears she can return at w/c level if requiring no more than min assist.  I let them know that I think it's a stretch to think that she could reach that level with a short stay on CIR, but we could potentially plan for d/c to SNF with her Medicare.  Daughter asking about possibility of hired care givers at ALF if she requires more than min assist.  I let her know that would be up to them but certainly would be fine with [place].  She's going to talk to her family and I will f/u with them tomorrow.   Estill Dooms, PT, DPT Admissions Coordinator [phone] 07/12/21  3:36 PM

## 2021-07-13 MED ORDER — AMLODIPINE BESYLATE 2.5 MG PO TABS
2.5000 mg | ORAL_TABLET | Freq: Every day | ORAL | Status: DC
  Administered 2021-07-13 – 2021-07-15 (×3): 2.5 mg via ORAL
  Filled 2021-07-13 (×3): qty 1

## 2021-07-13 NOTE — Progress Notes (Signed)
Physical Therapy Treatment Patient Details Name: Michelle Bauer MRN: [id number] DOB: 11/16/1940 Today's Date: 07/13/2021   History of Present Illness 81 y/o female presented to [facility] ED on 07/06/21 following fall hitting head at ALF and after EMS arrived, patient with slurred speech and L sided weakness with subsequent fall out of wheelchair with EMS present. 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 PMH: Afib, HTN, anxiety, scoliosis    PT Comments    Pt with improved ability to assist with rolling and maintain EOB balance in midline for >30 seconds today. Pt continues with dense L hemiparesis but demos improved sitting balance and ability to stand with assist of PT. Continue to recommend AIR upon d/c to decrease burden of care and educate family on w/c transfers for safe transition back to patients ALF. Acute PT to cont to follow.    Recommendations for follow up therapy are one component of a multi-disciplinary discharge planning process, led by the attending physician.  Recommendations may be updated based on patient status, additional functional criteria and insurance authorization.  Follow Up Recommendations  Acute inpatient rehab (3hours/day)     Assistance Recommended at Discharge Frequent or constant Supervision/Assistance  Patient can return home with the following Two people to help with walking and/or transfers;Two people to help with bathing/dressing/bathroom;Assistance with cooking/housework;Assistance with feeding;Direct supervision/assist for financial management;Direct supervision/assist for medications management;Assist for transportation   Equipment Recommendations  Other (comment) (TBD)    Recommendations for Other Services Rehab consult     Precautions / Restrictions Precautions Precautions: Fall Precaution Comments: SBP <160, hx of severe scoliosis, L inattention, L hemiplegia Restrictions Weight Bearing Restrictions: No Other Position/Activity Restrictions: scoliosis      Mobility  Bed Mobility Overal bed mobility: Needs Assistance Bed Mobility: Rolling, Sidelying to Sit Rolling: Min assist Sidelying to sit: Max assist       General bed mobility comments: when asked "how have we been getting to the EOB" pt initiated reaching with her R hand across for the bed for rail and pulled self into sidelying, maxA for trunk elevation due to inability to use LUE to push self up    Transfers Overall transfer level: Needs assistance Equipment used: 2 person hand held assist (face to face transfer with L LE blocked and use of gait belt) Transfers: Sit to/from Stand, Bed to chair/wheelchair/BSC Sit to Stand: Mod assist, +2 safety/equipment (good push up on R side, L requires blocking and heavier assist, completed 3 sit to stands)   Step pivot transfers: Max assist, +2 physical assistance       General transfer comment: maxA+2 to transfer to bed>BSC<>recliner. Pt with good power up through R side,  with step by step cues and maxA to block L knee pt able to advance R LE to step towards chair, dependent on PT to move L LE    Ambulation/Gait               General Gait Details: unable at this time   Stairs             Wheelchair Mobility    Modified Rankin (Stroke Patients Only) Modified Rankin (Stroke Patients Only) Pre-Morbid Rankin Score: Moderately severe disability Modified Rankin: Severe disability     Balance Overall balance assessment: Needs assistance Sitting-balance support: Single extremity supported, Feet supported Sitting balance-Leahy Scale: Poor Sitting balance - Comments: pt with improved ability to maintain EOB balance in midline position completing 3 bouts of 40-50 secs of maintains with close min guard  Postural control: Left lateral lean (anterior lean) Standing balance support: Bilateral upper extremity supported Standing balance-Leahy Scale: Zero Standing balance comment: maxA+2 to maintain standing                             Cognition Arousal/Alertness: Awake/alert Behavior During Therapy: Flat affect (more of a flat affect today compared to yesterday) Overall Cognitive Status: History of cognitive impairments - at baseline                                 General Comments: hx of cognitive deficits per SLP notes. Following commands appropriately. increased processing time required, improved initiation today and recall of prior techniques for mobilizing ie. log rolling        Exercises Other Exercises Other Exercises: worked on EOB static sitting balance  and maintaining midline posture    General Comments General comments (skin integrity, edema, etc.): VSS      Pertinent Vitals/Pain Pain Assessment Pain Assessment: No/denies pain    Home Living                          Prior Function            PT Goals (current goals can now be found in the care plan section) Acute Rehab PT Goals PT Goal Formulation: With patient/family Time For Goal Achievement: 07/21/21 Potential to Achieve Goals: Fair Progress towards PT goals: Progressing toward goals    Frequency    Min 4X/week      PT Plan Current plan remains appropriate    Co-evaluation              AM-PAC PT "6 Clicks" Mobility   Outcome Measure  Help needed turning from your back to your side while in a flat bed without using bedrails?: A Lot Help needed moving from lying on your back to sitting on the side of a flat bed without using bedrails?: A Lot Help needed moving to and from a bed to a chair (including a wheelchair)?: A Lot Help needed standing up from a chair using your arms (e.g., wheelchair or bedside chair)?: A Lot Help needed to walk in hospital room?: Total Help needed climbing 3-5 steps with a railing? : Total 6 Click Score: 10    End of Session Equipment Utilized During Treatment: Gait belt Activity Tolerance: Patient tolerated treatment well Patient left: with  call bell/phone within reach;with nursing/sitter in room;with family/visitor present (on [facility]) Nurse Communication: Mobility status PT Visit Diagnosis: Unsteadiness on feet (R26.81);Muscle weakness (generalized) (M62.81);Other abnormalities of gait and mobility (R26.89);History of falling (Z91.81);Other symptoms and signs involving the nervous system (R29.898)     Time: 6283-1517 PT Time Calculation (min) (ACUTE ONLY): 24 min  Charges:  $Therapeutic Activity: 8-22 mins $Neuromuscular Re-education: 8-22 mins                     Lewis Shock, PT, DPT Acute Rehabilitation Services Pager #: [phone] Office #: [phone]    Iona Hansen 07/13/2021, 12:08 PM

## 2021-07-13 NOTE — Progress Notes (Signed)
Inpatient Rehab Admissions Coordinator:   Pt continues to make progress with therapy.  Stopped by room to update pt/daughter that I do not have a bed for her to admit today.  Daughter, Carollee Herter, reports that ALF has not confirmed whether outside caregivers can provide assist if needed.  Will f/u tomorrow.   Estill Dooms, PT, DPT Admissions Coordinator [phone] 07/13/21  12:51 PM

## 2021-07-13 NOTE — Progress Notes (Signed)
PROGRESS NOTE  KESSA LEDWITH  DOB: 17-Nov-1940  PCP: [name], MD WNI:[id number]  DOA: 07/06/2021  LOS: 7 days  Hospital Day: 8   Chief complaint: Found in the bathroom floor  Brief narrative: Michelle Bauer is a 81 y.o. female with PMH significant for A. fib on Eliquis, HTN, HLD, anxiety/depression Patient was brought to the [facility] ED on 07/06/2021 from a facility.  At the facility, a thud was heard and patient was found on the bathroom floor with her head against a wall and bleeding to the left side of the eye. EMS noted left-sided deficits with weakness, left facial droop and slurred speech  In the ED, she was unable to lift her left arm, left leg, had gaze to the right, she had slurred speech with slight facial droop noted on the left side of the face. 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  Stroke work-up completed. Physical therapy recommended inpatient rehab.  Subjective: Patient was seen and examined this morning.  Pleasant elderly Caucasian female.   Lying on bed.  Not in distress.  On low-flow oxygen. Blood pressure in 160s.  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  Patient was admitted to the stroke service.   -Stroke work-up completed.  Neurology consult appreciated -A1c 5.3, LDL 103 -Echo showed normal systolic function, grade 1 diastolic dysfunction -Currently off any antiplatelet or anticoagulation. -Currently not on statin. -Current deficits include significant left-sided weakness -PT eval obtained.  CIR recommended  Paroxysmal A-fib (HCC)- (present on admission) -Noted to be on amiodarone, metoprolol and Eliquis prior to admission. -Eliquis on hold due to intraparenchymal hemorrhage.   -Remains on metoprolol and amiodarone.  Monitor on telemetry.   Acute  respiratory failure with hypoxia -Patient has been requiring supplemental oxygen 2 to 3 L by nasal cannula since 1/20.   -CT chest did not show any pulmonary embolism or effusion but showed bilateral lower lobe atelectasis.   -Continue supplemental oxygen, incentive spirometry  -Wean down as tolerated.  E. coli UTI  -Completed 5-day course of Keflex.    Essential hypertension -Currently on metoprolol.  Target blood pressure less than 160 per neurology.   -In the last 24 hours, her blood pressure has been mostly 160s and 170s.  Add amlodipine 2.5 mg daily. -Continue to monitor   Generalized anxiety disorder -Continue Lexapro and Klonopin.    Mobility: Encourage ambulation. Living condition: Was in a facility Goals of care:   Code Status: DNR  Nutritional status: There is no height or weight on file to calculate BMI.      Diet:  Diet Order             Diet - low sodium heart healthy           Diet Heart Room service appropriate? Yes with Assist; Fluid consistency: Thin  Diet effective now                  DVT prophylaxis:  enoxaparin (LOVENOX) injection 40 mg Start: 07/08/21 1400 SCD's Start: 07/06/21 0649   Antimicrobials: None currently Fluid: None Consultants: Neurology, Family Communication: None at bedside  Status is: Inpatient  Continue in-hospital care because: Pending CIR Level of care: Telemetry Medical   Dispo: The patient is from: Facility              Anticipated d/c is to: CIR  Patient currently is medically stable to d/c.   Difficult to place patient No     Infusions:    Scheduled Meds:  amiodarone  200 mg Oral Daily   amLODipine  2.5 mg Oral Daily   clonazePAM  0.5 mg Oral BID   enoxaparin (LOVENOX) injection  40 mg Subcutaneous Daily   escitalopram  5 mg Oral q morning   mouth rinse  15 mL Mouth Rinse BID   metoprolol tartrate  25 mg Oral BID   pantoprazole  40 mg Oral QHS   polyethylene glycol  17 g Oral Daily     PRN meds: acetaminophen **OR** acetaminophen (TYLENOL) oral liquid 160 mg/5 mL **OR** acetaminophen, bacitracin, clonazePAM, hydrALAZINE, labetalol, morphine injection, senna-docusate, traMADol   Antimicrobials: Anti-infectives (From admission, onward)    Start     Dose/Rate Route Frequency Ordered Stop   07/09/21 1400  cephALEXin (KEFLEX) capsule 500 mg        500 mg Oral Every 8 hours 07/09/21 0954 07/10/21 1504   07/07/21 1100  cefTRIAXone (ROCEPHIN) 1 g in sodium chloride 0.9 % 100 mL IVPB  Status:  Discontinued        1 g 200 mL/hr over 30 Minutes Intravenous Every 24 hours 07/07/21 0941 07/09/21 0954   07/06/21 1000  nitrofurantoin (macrocrystal-monohydrate) (MACROBID) capsule 100 mg  Status:  Discontinued        100 mg Oral Every 12 hours 07/06/21 0700 07/07/21 0941       Objective: Vitals:   07/13/21 0338 07/13/21 0729  BP: (!) 152/55 (!) 166/59  Pulse: 62 63  Resp: 18 16  Temp: 98.3 F (36.8 C) 97.7 F (36.5 C)  SpO2: 97% 96%    Intake/Output Summary (Last 24 hours) at 07/13/2021 1031 Last data filed at 07/13/2021 0200 Gross per 24 hour  Intake 300 ml  Output 350 ml  Net -50 ml   There were no vitals filed for this visit. Weight change:  There is no height or weight on file to calculate BMI.   Physical Exam: General exam: Pleasant elderly Caucasian female.  Not in physical distress Skin: No rashes, lesions or ulcers. HEENT: Atraumatic, normocephalic, no obvious bleeding Lungs: Diminished air entry in both bases CVS: Regular rate and rhythm, 1+ murmur in aortic area GI/Abd soft, nontender, nondistended, bowel sound present CNS: Alert, awake, slow to respond, dense left hemiparesis Psychiatry: Sad affect Extremities: No pedal edema, no calf tenderness  Data Review: I have personally reviewed the laboratory data and studies available.  F/u labs  Unresulted Labs (From admission, onward)    None       Signed, Terrilee Croak, MD [facility] 07/13/2021

## 2021-07-14 ENCOUNTER — Inpatient Hospital Stay (HOSPITAL_COMMUNITY): Payer: Medicare Other

## 2021-07-14 MED ORDER — SODIUM CHLORIDE 0.9 % IV SOLN: INTRAVENOUS | Status: DC

## 2021-07-14 MED ORDER — ONDANSETRON HCL 4 MG/2ML IJ SOLN: 4.0000 mg | Freq: Four times a day (QID) | INTRAMUSCULAR | Status: DC | PRN

## 2021-07-14 NOTE — Progress Notes (Signed)
Inpatient Rehab Admissions Coordinator:   Met with patient and family at bedside.  Pt continues to make slow, steady progress with therapies.  Family on board for CIR admission when bed available.  Working on clarifying with ALF whether they can bring caregivers in. I don't have a bed today for this patient, but will likely be able to admit her in the next 1-2 days.   Shann Medal, PT, DPT Admissions Coordinator [phone] 07/14/21  1:24 PM

## 2021-07-14 NOTE — Progress Notes (Signed)
Physical Therapy Treatment Patient Details Name: Michelle Bauer MRN: [id number] DOB: 16-Oct-1940 Today's Date: 07/14/2021   History of Present Illness 81 y/o female presented to [facility] ED on 07/06/21 following fall hitting head at ALF and after EMS arrived, patient with slurred speech and L sided weakness with subsequent fall out of wheelchair with EMS present. 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 PMH: Afib, HTN, anxiety, scoliosis    PT Comments    Pt seen with OT today to focus on EOB balance while doing ADLs with OT. Pt able to control trunk without UE support with min/mod tactile cues at pelvis to stabilize. Pt continuing to improve ability to initiate transfer to EOB and demo's progressive improvement with EOB balance. Pt remains to have dense L hemiplegia and impaired sensation. Continue to recommend AIR upon d/c to progress pt to w/c level of function with minA for ADLs to then transition back to ALF. Acute PT to cont to follow.   Recommendations for follow up therapy are one component of a multi-disciplinary discharge planning process, led by the attending physician.  Recommendations may be updated based on patient status, additional functional criteria and insurance authorization.  Follow Up Recommendations  Acute inpatient rehab (3hours/day)     Assistance Recommended at Discharge Frequent or constant Supervision/Assistance  Patient can return home with the following Two people to help with walking and/or transfers;Two people to help with bathing/dressing/bathroom;Assistance with cooking/housework;Assistance with feeding;Direct supervision/assist for financial management;Direct supervision/assist for medications management;Assist for transportation   Equipment Recommendations  Other (comment) (TBD)    Recommendations for Other Services Rehab consult     Precautions / Restrictions Precautions Precautions: Fall Precaution Comments: SBP <160, hx of severe scoliosis, L inattention, L  hemiplegia Restrictions Weight Bearing Restrictions: No Other Position/Activity Restrictions: scoliosis     Mobility  Bed Mobility Overal bed mobility: Needs Assistance Bed Mobility: Rolling, Sidelying to Sit Rolling: Min assist Sidelying to sit: Max assist, Mod assist, HOB elevated       General bed mobility comments: directional verbal cues, minA to achieve full log roll, maxA for trunk elevation, pt with improved effort to use R LE in attempt to bring L LE off EOB    Transfers Overall transfer level: Needs assistance Equipment used: 2 person hand held assist (face to face with left knee blocked) Transfers: Sit to/from Stand, Bed to chair/wheelchair/BSC Sit to Stand: Mod assist, +2 physical assistance (left kneed blocked)   Step pivot transfers: Max assist, +2 physical assistance       General transfer comment: patient shuffles right foot into position, maxA for weight shifting, maxA for L LE mangement    Ambulation/Gait               General Gait Details: unable at this time   Stairs             Wheelchair Mobility    Modified Rankin (Stroke Patients Only) Modified Rankin (Stroke Patients Only) Pre-Morbid Rankin Score: Moderately severe disability Modified Rankin: Severe disability     Balance Overall balance assessment: Needs assistance Sitting-balance support: Single extremity supported, Feet supported Sitting balance-Leahy Scale: Poor Sitting balance - Comments: min assist for sitting balance.  Addressed posture alignment and pushing up with RUE Postural control: Left lateral lean Standing balance support: Bilateral upper extremity supported Standing balance-Leahy Scale: Zero Standing balance comment: maxA+2 to maintain standing  Cognition Arousal/Alertness: Awake/alert Behavior During Therapy: Flat affect Overall Cognitive Status: History of cognitive impairments - at baseline                                  General Comments: delayed response time, delayed processing however improving compared to initial eval, pt oriented to date        Exercises Other Exercises Other Exercises: worked on EOB static and dynamic sitting balance. PT posterior to provide support via both tactile and verbal cues. Pt initially modA to maintain EOB balance, then progressed to minA. With tactile cues at pelvis pt able to maintain mid line while brushing hair, washing face, and blowing nose with OT, 3 seperate bouts of approx 30-45 sec. Other Exercises: worked on control descent down to R elbow and pushing self up into midline with minA, completed 6 trials, pt with improvement each trial    General Comments General comments (skin integrity, edema, etc.): VSS      Pertinent Vitals/Pain Pain Assessment Pain Assessment: Faces Faces Pain Scale: No hurt    Home Living                          Prior Function            PT Goals (current goals can now be found in the care plan section) Acute Rehab PT Goals Patient Stated Goal: did not state PT Goal Formulation: With patient/family Time For Goal Achievement: 07/21/21 Potential to Achieve Goals: Fair Progress towards PT goals: Progressing toward goals    Frequency    Min 4X/week      PT Plan Current plan remains appropriate    Co-evaluation PT/OT/SLP Co-Evaluation/Treatment: Yes Reason for Co-Treatment: Complexity of the patient's impairments (multi-system involvement) PT goals addressed during session: Mobility/safety with mobility OT goals addressed during session: ADL's and self-care      AM-PAC PT "6 Clicks" Mobility   Outcome Measure  Help needed turning from your back to your side while in a flat bed without using bedrails?: A Lot Help needed moving from lying on your back to sitting on the side of a flat bed without using bedrails?: A Lot Help needed moving to and from a bed to a chair (including a  wheelchair)?: A Lot Help needed standing up from a chair using your arms (e.g., wheelchair or bedside chair)?: A Lot Help needed to walk in hospital room?: Total Help needed climbing 3-5 steps with a railing? : Total 6 Click Score: 10    End of Session Equipment Utilized During Treatment: Gait belt Activity Tolerance: Patient tolerated treatment well Patient left: with call bell/phone within reach;in chair;with chair alarm set (on [facility]) Nurse Communication: Mobility status PT Visit Diagnosis: Unsteadiness on feet (R26.81);Muscle weakness (generalized) (M62.81);Other abnormalities of gait and mobility (R26.89);History of falling (Z91.81);Other symptoms and signs involving the nervous system (R29.898)     Time: 3825-0539 PT Time Calculation (min) (ACUTE ONLY): 41 min  Charges:  $Therapeutic Activity: 8-22 mins $Neuromuscular Re-education: 8-22 mins                     Lewis Shock, PT, DPT Acute Rehabilitation Services Pager #: [phone] Office #: [phone]    Iona Hansen 07/14/2021, 10:11 AM

## 2021-07-14 NOTE — Progress Notes (Addendum)
PROGRESS NOTE  Michelle Bauer  DOB: 06-17-1941  PCP: [name], MD OHY:[id number]  DOA: 07/06/2021  LOS: 8 days  Hospital Day: 9   Chief complaint: Found in the bathroom floor  Brief narrative: Michelle Bauer is a 81 y.o. female with PMH significant for A. fib on Eliquis, HTN, HLD, anxiety/depression Patient was brought to the [facility] ED on 07/06/2021 from an assisted living facility facility.  At the facility, a thud was heard and patient was found on the bathroom floor with her head against a wall and bleeding to the left side of the eye. EMS noted left-sided deficits with weakness, left facial droop and slurred speech  In the ED, she was unable to lift her left arm, left leg, had gaze to the right, she had slurred speech with slight facial droop noted on the left side of the face. 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  Stroke work-up completed. Physical therapy recommended inpatient rehab.  Subjective: Patient was seen and examined this morning.  Sitting up in chair.  Not in distress.  No new symptoms.  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  Patient was admitted to the stroke service.   -Stroke work-up completed.  Neurology consult appreciated -A1c 5.3, LDL 103 -Echo showed normal systolic function, grade 1 diastolic dysfunction -Currently off any antiplatelet or anticoagulation. -Currently not on statin. -Current deficits include significant left-sided weakness -PT eval obtained.  CIR recommended  Paroxysmal A-fib (HCC)- (present on admission) -Noted to be on amiodarone, metoprolol and Eliquis prior to admission. -Eliquis on hold due to intraparenchymal hemorrhage.   -Remains on metoprolol and amiodarone.  Monitor on telemetry.   Acute respiratory failure with  hypoxia -Patient has been requiring supplemental oxygen 2 to 3 L by nasal cannula since 1/20.   -CT chest did not show any pulmonary embolism or effusion but showed bilateral lower lobe atelectasis.   -Continue supplemental oxygen, incentive spirometry  -Wean down as tolerated.  E. coli UTI  -Completed 5-day course of Keflex.    Essential hypertension -Currently on metoprolol and amlodipine.  Continue to monitor blood pressure trend.   Generalized anxiety disorder -Continue Lexapro and Klonopin.  Distended abdomen -Early in the course, patient had diarrhea which improved.  Has distended abdomen.  Abdomen abdominal x-ray today.    Mobility: Encourage ambulation. Living condition: Assisted living facility facility Goals of care:   Code Status: DNR  Nutritional status: There is no height or weight on file to calculate BMI.      Diet:  Diet Order             Diet - low sodium heart healthy           Diet Heart Room service appropriate? Yes with Assist; Fluid consistency: Thin  Diet effective now                  DVT prophylaxis:  enoxaparin (LOVENOX) injection 40 mg Start: 07/08/21 1400 SCD's Start: 07/06/21 0649   Antimicrobials: None currently Fluid: None Consultants: Neurology, Family Communication: None at bedside  Status is: Inpatient  Continue in-hospital care because: Pending CIR Level of care: Telemetry Medical   Dispo: The patient is from: ALF              Anticipated d/c is to: CIR  Patient currently is medically stable to d/c.   Difficult to place patient No     Infusions:    Scheduled Meds:  amiodarone  200 mg Oral Daily   amLODipine  2.5 mg Oral Daily   clonazePAM  0.5 mg Oral BID   enoxaparin (LOVENOX) injection  40 mg Subcutaneous Daily   escitalopram  5 mg Oral q morning   mouth rinse  15 mL Mouth Rinse BID   metoprolol tartrate  25 mg Oral BID   pantoprazole  40 mg Oral QHS   polyethylene glycol  17 g Oral Daily     PRN meds: acetaminophen **OR** acetaminophen (TYLENOL) oral liquid 160 mg/5 mL **OR** acetaminophen, bacitracin, clonazePAM, hydrALAZINE, labetalol, morphine injection, senna-docusate, traMADol   Antimicrobials: Anti-infectives (From admission, onward)    Start     Dose/Rate Route Frequency Ordered Stop   07/09/21 1400  cephALEXin (KEFLEX) capsule 500 mg        500 mg Oral Every 8 hours 07/09/21 0954 07/10/21 1504   07/07/21 1100  cefTRIAXone (ROCEPHIN) 1 g in sodium chloride 0.9 % 100 mL IVPB  Status:  Discontinued        1 g 200 mL/hr over 30 Minutes Intravenous Every 24 hours 07/07/21 0941 07/09/21 0954   07/06/21 1000  nitrofurantoin (macrocrystal-monohydrate) (MACROBID) capsule 100 mg  Status:  Discontinued        100 mg Oral Every 12 hours 07/06/21 0700 07/07/21 0941       Objective: Vitals:   07/14/21 0342 07/14/21 0842  BP: 139/68 (!) 160/66  Pulse: (!) 57 79  Resp: 19 (!) 26  Temp: 98.1 F (36.7 C) (!) 97.3 F (36.3 C)  SpO2: 97% 95%    Intake/Output Summary (Last 24 hours) at 07/14/2021 1103 Last data filed at 07/14/2021 1053 Gross per 24 hour  Intake --  Output 700 ml  Net -700 ml    There were no vitals filed for this visit. Weight change:  There is no height or weight on file to calculate BMI.   Physical Exam: General exam: Pleasant elderly Caucasian female.  Not in physical distress.  On low-flow oxygen Skin: No rashes, lesions or ulcers. HEENT: Atraumatic, normocephalic, no obvious bleeding Lungs: Diminished air entry in both bases CVS: Regular rate and rhythm, 1+ murmur in aortic area GI/Abd soft, nontender, mildly distended, bowel sound present CNS: Alert, awake, slow to respond, dense left hemiparesis Psychiatry: Sad affect Extremities: No pedal edema, no calf tenderness  Data Review: I have personally reviewed the laboratory data and studies available.  F/u labs  Unresulted Labs (From admission, onward)    None        Signed, Lorin Glass, MD [facility] 07/14/2021

## 2021-07-14 NOTE — Progress Notes (Signed)
Occupational Therapy Treatment Patient Details Name: Michelle Bauer MRN: [id number] DOB: 1941-04-28 Today's Date: 07/14/2021   History of present illness 81 y/o female [facility] ED [facility] 07[facility]21 following fall hitting head at ALF and after EMS arrived, patient with slurred speech and L sided weakness with subsequent fall out of wheelchair with EMS present. 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 PMH: Afib, HTN, anxiety, scoliosis   OT comments  Patient seen in supine and agreeable to PT/OT treatment. Patient demonstrated good initiation when cued on bed mobility to get to EOB. Patient performed grooming and UB dressing seated on EOB with assistance of another for balance.  Patient performed sitting balance on EOB to improved postural alignment and address pushing with RUE. Patient was +2 transfer to recliner requiring LLE knee blocked.  Patient making good gains and is motivated towards therapy. Patient is a good AIR candidate.    Recommendations for follow up therapy are one component of a multi-disciplinary discharge planning process, led by the attending physician.  Recommendations may be updated based on patient status, additional functional criteria and insurance authorization.    Follow Up Recommendations  Acute inpatient rehab (3hours/day)    Assistance Recommended at Discharge Frequent or constant Supervision/Assistance  Patient can return home with the following  Two people to help with walking and/or transfers;Two people to help with bathing/dressing/bathroom;Direct supervision/assist for financial management;Assist for transportation;Assistance with cooking/housework;Assistance with feeding;Help with stairs or ramp for entrance;Direct supervision/assist for medications management   Equipment Recommendations  Wheelchair (measurements OT);Wheelchair cushion (measurements OT)    Recommendations for Other Services      Precautions / Restrictions Precautions Precautions:  Fall Precaution Comments: SBP <160, hx of severe scoliosis, L inattention, L hemiplegia       Mobility Bed Mobility Overal bed mobility: Needs Assistance Bed Mobility: Rolling, Sidelying to Sit Rolling: Min assist Sidelying to sit: Max assist       General bed mobility comments: verbal cues throughtout for use or rail and technique    Transfers Overall transfer level: Needs assistance Equipment used: 2 person hand held assist (face to face with left knee blocked) Transfers: Sit to/from Stand, Bed to chair/wheelchair/BSC Sit to Stand: Mod assist, +2 physical assistance (left kneed blocked)     Step pivot transfers: Max assist, +2 physical assistance     General transfer comment: patient shuffles right foot into position     Balance Overall balance assessment: Needs assistance Sitting-balance support: Single extremity supported, Feet supported Sitting balance-Leahy Scale: Poor Sitting balance - Comments: min assist for sitting balance.  Addressed posture alignment and pushing up with RUE Postural control: Left lateral lean Standing balance support: Bilateral upper extremity supported Standing balance-Leahy Scale: Zero Standing balance comment: maxA+2 to maintain standing                           ADL either performed or assessed with clinical judgement   ADL Overall ADL's : Needs assistance/impaired Eating/Feeding: Set up   Grooming: Wash/dry hands;Wash/dry face;Brushing hair;Sitting;Moderate assistance Grooming Details (indicate cue type and reason): unable to use LUE to assist with self care tasks         Upper Body Dressing : Moderate assistance;Sitting Upper Body Dressing Details (indicate cue type and reason): verbal cues to attend to left, changed gown                   General ADL Comments: grooming and gown change performed seated  on EOB with assistance of another for sitting balance    Extremity/Trunk Assessment Upper Extremity  Assessment LUE Deficits / Details: no sensation, no movement LUE Sensation: decreased light touch LUE Coordination: decreased fine motor;decreased gross motor            Vision   Convergence: Within functional limits   Perception     Praxis      Cognition Arousal/Alertness: Awake/alert Behavior During Therapy: Flat affect Overall Cognitive Status: History of cognitive impairments - at baseline                                 General Comments: followed commands but pushes to left and requires cues for posture        Exercises      Shoulder Instructions       General Comments      Pertinent Vitals/ Pain       Pain Assessment Pain Assessment: Faces Faces Pain Scale: No hurt  Home Living                                          Prior Functioning/Environment              Frequency  Min 2X/week        Progress Toward Goals  OT Goals(current goals can now be found in the care plan section)  Progress towards OT goals: Progressing toward goals  Acute Rehab OT Goals Patient Stated Goal: get better OT Goal Formulation: With patient Time For Goal Achievement: 07/21/21 Potential to Achieve Goals: Good ADL Goals Pt Will Perform Grooming: with min assist;sitting Pt Will Perform Upper Body Bathing: with min assist;sitting Pt Will Perform Upper Body Dressing: with min assist;sitting Pt Will Transfer to Toilet: with min assist;with +2 assist;stand pivot transfer;bedside commode Pt/caregiver will Perform Home Exercise Program: Increased ROM;Increased strength;Left upper extremity;With minimal assist  Plan Discharge plan needs to be updated    Co-evaluation    PT/OT/SLP Co-Evaluation/Treatment: Yes Reason for Co-Treatment: For patient/therapist safety;To address functional/ADL transfers   OT goals addressed during session: ADL's and self-care      AM-PAC OT "6 Clicks" Daily Activity     Outcome Measure   Help from  another person eating meals?: A Little Help from another person taking care of personal grooming?: A Little Help from another person toileting, which includes using toliet, bedpan, or urinal?: A Lot Help from another person bathing (including washing, rinsing, drying)?: A Lot Help from another person to put on and taking off regular upper body clothing?: A Lot Help from another person to put on and taking off regular lower body clothing?: A Lot 6 Click Score: 14    End of Session Equipment Utilized During Treatment: Gait belt;Oxygen  OT Visit Diagnosis: Unsteadiness on feet (R26.81);Muscle weakness (generalized) (M62.81);History of falling (Z91.81);Other symptoms and signs involving cognitive function;Hemiplegia and hemiparesis Hemiplegia - Right/Left: Left Hemiplegia - dominant/non-dominant: Non-Dominant Hemiplegia - caused by: Nontraumatic intracerebral hemorrhage   Activity Tolerance Patient tolerated treatment well   Patient Left in chair;with call bell/phone within reach;with chair alarm set   Nurse Communication Mobility status        Time: 7096-2836 OT Time Calculation (min): 37 min  Charges: OT General Charges $OT Visit: 1 Visit OT Treatments $Self Care/Home Management : 8-22 mins  Alfonse Flavors, OTA  Acute Rehabilitation Services  Pager [phone] Office [phone]   Dewain Penning 07/14/2021, 9:36 AM

## 2021-07-15 ENCOUNTER — Inpatient Hospital Stay (HOSPITAL_COMMUNITY)
Admission: RE | Admit: 2021-07-15 | Discharge: 2021-08-03 | DRG: 057 | Disposition: A | Discharge status: Skilled Nursing Facility | Payer: Medicare Other | Source: Intra-hospital | Attending: Physical Medicine & Rehabilitation | Admitting: Physical Medicine & Rehabilitation

## 2021-07-15 ENCOUNTER — Other Ambulatory Visit: Payer: Self-pay

## 2021-07-15 ENCOUNTER — Encounter (HOSPITAL_COMMUNITY): Payer: Self-pay | Admitting: Neurology

## 2021-07-15 ENCOUNTER — Encounter (HOSPITAL_COMMUNITY): Payer: Self-pay | Admitting: Physical Medicine & Rehabilitation

## 2021-07-15 DIAGNOSIS — Z7901 Long term (current) use of anticoagulants: Secondary | ICD-10-CM

## 2021-07-15 DIAGNOSIS — M419 Scoliosis, unspecified: Secondary | ICD-10-CM | POA: Diagnosis present

## 2021-07-15 DIAGNOSIS — E663 Overweight: Secondary | ICD-10-CM | POA: Diagnosis present

## 2021-07-15 DIAGNOSIS — Z20822 Contact with and (suspected) exposure to covid-19: Secondary | ICD-10-CM | POA: Diagnosis present

## 2021-07-15 DIAGNOSIS — I619 Nontraumatic intracerebral hemorrhage, unspecified: Secondary | ICD-10-CM | POA: Diagnosis not present

## 2021-07-15 DIAGNOSIS — R0902 Hypoxemia: Secondary | ICD-10-CM | POA: Diagnosis present

## 2021-07-15 DIAGNOSIS — G4700 Insomnia, unspecified: Secondary | ICD-10-CM | POA: Diagnosis present

## 2021-07-15 DIAGNOSIS — I69354 Hemiplegia and hemiparesis following cerebral infarction affecting left non-dominant side: Secondary | ICD-10-CM | POA: Diagnosis present

## 2021-07-15 DIAGNOSIS — Z811 Family history of alcohol abuse and dependence: Secondary | ICD-10-CM

## 2021-07-15 DIAGNOSIS — I10 Essential (primary) hypertension: Secondary | ICD-10-CM | POA: Diagnosis present

## 2021-07-15 DIAGNOSIS — Z823 Family history of stroke: Secondary | ICD-10-CM

## 2021-07-15 DIAGNOSIS — Z800 Family history of malignant neoplasm of digestive organs: Secondary | ICD-10-CM

## 2021-07-15 DIAGNOSIS — F419 Anxiety disorder, unspecified: Secondary | ICD-10-CM | POA: Diagnosis present

## 2021-07-15 DIAGNOSIS — D649 Anemia, unspecified: Secondary | ICD-10-CM | POA: Diagnosis not present

## 2021-07-15 DIAGNOSIS — Z66 Do not resuscitate: Secondary | ICD-10-CM | POA: Diagnosis not present

## 2021-07-15 DIAGNOSIS — Z880 Allergy status to penicillin: Secondary | ICD-10-CM | POA: Diagnosis not present

## 2021-07-15 DIAGNOSIS — Z882 Allergy status to sulfonamides status: Secondary | ICD-10-CM | POA: Diagnosis not present

## 2021-07-15 DIAGNOSIS — I610 Nontraumatic intracerebral hemorrhage in hemisphere, subcortical: Secondary | ICD-10-CM | POA: Diagnosis not present

## 2021-07-15 DIAGNOSIS — R7301 Impaired fasting glucose: Secondary | ICD-10-CM | POA: Diagnosis present

## 2021-07-15 DIAGNOSIS — F32A Depression, unspecified: Secondary | ICD-10-CM | POA: Diagnosis present

## 2021-07-15 DIAGNOSIS — D62 Acute posthemorrhagic anemia: Secondary | ICD-10-CM | POA: Diagnosis present

## 2021-07-15 DIAGNOSIS — F411 Generalized anxiety disorder: Secondary | ICD-10-CM | POA: Diagnosis present

## 2021-07-15 DIAGNOSIS — Q254 Congenital malformation of aorta unspecified: Secondary | ICD-10-CM

## 2021-07-15 DIAGNOSIS — E46 Unspecified protein-calorie malnutrition: Secondary | ICD-10-CM | POA: Diagnosis present

## 2021-07-15 DIAGNOSIS — Z833 Family history of diabetes mellitus: Secondary | ICD-10-CM | POA: Diagnosis not present

## 2021-07-15 DIAGNOSIS — Z6828 Body mass index (BMI) 28.0-28.9, adult: Secondary | ICD-10-CM | POA: Diagnosis not present

## 2021-07-15 DIAGNOSIS — E785 Hyperlipidemia, unspecified: Secondary | ICD-10-CM | POA: Diagnosis present

## 2021-07-15 DIAGNOSIS — I480 Paroxysmal atrial fibrillation: Secondary | ICD-10-CM | POA: Diagnosis present

## 2021-07-15 DIAGNOSIS — Z8249 Family history of ischemic heart disease and other diseases of the circulatory system: Secondary | ICD-10-CM

## 2021-07-15 DIAGNOSIS — R7989 Other specified abnormal findings of blood chemistry: Secondary | ICD-10-CM

## 2021-07-15 DIAGNOSIS — I4819 Other persistent atrial fibrillation: Secondary | ICD-10-CM | POA: Diagnosis not present

## 2021-07-15 MED ORDER — PROCHLORPERAZINE MALEATE 5 MG PO TABS: 5.0000 mg | ORAL_TABLET | Freq: Four times a day (QID) | ORAL | Status: DC | PRN

## 2021-07-15 MED ORDER — MELATONIN 3 MG PO TABS
3.0000 mg | ORAL_TABLET | Freq: Every day | ORAL | Status: DC
  Administered 2021-07-15 – 2021-08-02 (×19): 3 mg via ORAL
  Filled 2021-07-15 (×19): qty 1

## 2021-07-15 MED ORDER — ENOXAPARIN SODIUM 40 MG/0.4ML IJ SOSY: 40.0000 mg | PREFILLED_SYRINGE | INTRAMUSCULAR | Status: DC

## 2021-07-15 MED ORDER — CLONAZEPAM 0.5 MG PO TABS
0.5000 mg | ORAL_TABLET | Freq: Two times a day (BID) | ORAL | Status: DC
  Administered 2021-07-15 – 2021-08-03 (×38): 0.5 mg via ORAL
  Filled 2021-07-15 (×38): qty 1

## 2021-07-15 MED ORDER — CLONAZEPAM 0.5 MG PO TABS: 0.5000 mg | ORAL_TABLET | Freq: Two times a day (BID) | ORAL | Status: DC | PRN

## 2021-07-15 MED ORDER — ESCITALOPRAM OXALATE 10 MG PO TABS
5.0000 mg | ORAL_TABLET | Freq: Every morning | ORAL | Status: DC
  Administered 2021-07-16 – 2021-08-02 (×18): 5 mg via ORAL
  Filled 2021-07-15 (×15): qty 1

## 2021-07-15 MED ORDER — AMIODARONE HCL 200 MG PO TABS
200.0000 mg | ORAL_TABLET | Freq: Every day | ORAL | Status: DC
  Administered 2021-07-16 – 2021-08-03 (×19): 200 mg via ORAL
  Filled 2021-07-15 (×19): qty 1

## 2021-07-15 MED ORDER — ALUM & MAG HYDROXIDE-SIMETH 200-200-20 MG/5ML PO SUSP: 30.0000 mL | ORAL | Status: DC | PRN

## 2021-07-15 MED ORDER — FLEET ENEMA 7-19 GM/118ML RE ENEM: 1.0000 | ENEMA | Freq: Once | RECTAL | Status: DC | PRN

## 2021-07-15 MED ORDER — DIPHENHYDRAMINE HCL 12.5 MG/5ML PO ELIX: 12.5000 mg | ORAL_SOLUTION | Freq: Four times a day (QID) | ORAL | Status: DC | PRN

## 2021-07-15 MED ORDER — PANTOPRAZOLE SODIUM 40 MG PO TBEC
40.0000 mg | DELAYED_RELEASE_TABLET | Freq: Every day | ORAL | Status: DC
  Administered 2021-07-15 – 2021-08-02 (×19): 40 mg via ORAL
  Filled 2021-07-15 (×19): qty 1

## 2021-07-15 MED ORDER — PROCHLORPERAZINE EDISYLATE 10 MG/2ML IJ SOLN: 5.0000 mg | Freq: Four times a day (QID) | INTRAMUSCULAR | Status: DC | PRN

## 2021-07-15 MED ORDER — GUAIFENESIN-DM 100-10 MG/5ML PO SYRP: 5.0000 mL | ORAL_SOLUTION | Freq: Four times a day (QID) | ORAL | Status: DC | PRN

## 2021-07-15 MED ORDER — BISACODYL 10 MG RE SUPP
10.0000 mg | Freq: Every day | RECTAL | Status: DC
  Administered 2021-07-15: 10 mg via RECTAL
  Filled 2021-07-15: qty 1

## 2021-07-15 MED ORDER — AMLODIPINE BESYLATE 2.5 MG PO TABS
2.5000 mg | ORAL_TABLET | Freq: Every day | ORAL | Status: DC
  Administered 2021-07-16 – 2021-08-03 (×18): 2.5 mg via ORAL
  Filled 2021-07-15 (×19): qty 1

## 2021-07-15 MED ORDER — PROCHLORPERAZINE 25 MG RE SUPP: 12.5000 mg | Freq: Four times a day (QID) | RECTAL | Status: DC | PRN

## 2021-07-15 MED ORDER — AMLODIPINE BESYLATE 2.5 MG PO TABS: 2.5000 mg | ORAL_TABLET | Freq: Every day | ORAL | Status: DC

## 2021-07-15 MED ORDER — TRAMADOL HCL 50 MG PO TABS: 50.0000 mg | ORAL_TABLET | Freq: Four times a day (QID) | ORAL | Status: DC | PRN

## 2021-07-15 MED ORDER — POLYETHYLENE GLYCOL 3350 17 G PO PACK: 17.0000 g | PACK | Freq: Every day | ORAL | Status: DC | PRN

## 2021-07-15 MED ORDER — TRAZODONE HCL 50 MG PO TABS
25.0000 mg | ORAL_TABLET | Freq: Every evening | ORAL | Status: DC | PRN
  Administered 2021-07-30: 50 mg via ORAL
  Filled 2021-07-15 (×2): qty 1

## 2021-07-15 MED ORDER — BISACODYL 10 MG RE SUPP
10.0000 mg | Freq: Every day | RECTAL | Status: DC
  Administered 2021-07-16 – 2021-07-31 (×14): 10 mg via RECTAL
  Filled 2021-07-15 (×16): qty 1

## 2021-07-15 MED ORDER — ENOXAPARIN SODIUM 40 MG/0.4ML IJ SOSY
40.0000 mg | PREFILLED_SYRINGE | Freq: Every day | INTRAMUSCULAR | Status: DC
  Administered 2021-07-16 – 2021-07-21 (×6): 40 mg via SUBCUTANEOUS
  Filled 2021-07-15 (×6): qty 0.4

## 2021-07-15 MED ORDER — METOPROLOL TARTRATE 25 MG PO TABS
25.0000 mg | ORAL_TABLET | Freq: Two times a day (BID) | ORAL | Status: DC
  Administered 2021-07-15 – 2021-08-03 (×37): 25 mg via ORAL
  Filled 2021-07-15 (×38): qty 1

## 2021-07-15 MED ORDER — BLOOD PRESSURE CONTROL BOOK
Freq: Once | Status: AC
  Filled 2021-07-15: qty 1

## 2021-07-15 MED ORDER — BISACODYL 10 MG RE SUPP: 10.0000 mg | Freq: Every day | RECTAL | Status: DC | PRN

## 2021-07-15 MED ORDER — ORAL CARE MOUTH RINSE
15.0000 mL | Freq: Two times a day (BID) | OROMUCOSAL | Status: DC
  Administered 2021-07-15 – 2021-08-03 (×34): 15 mL via OROMUCOSAL

## 2021-07-15 MED ORDER — AMLODIPINE BESYLATE 2.5 MG PO TABS: 2.5000 mg | ORAL_TABLET | Freq: Every day | ORAL | Status: AC

## 2021-07-15 MED ORDER — BACITRACIN ZINC 500 UNIT/GM EX OINT: TOPICAL_OINTMENT | CUTANEOUS | Status: DC | PRN

## 2021-07-15 MED ORDER — POLYETHYLENE GLYCOL 3350 17 G PO PACK
17.0000 g | PACK | Freq: Every day | ORAL | Status: DC
  Administered 2021-07-16 – 2021-08-03 (×18): 17 g via ORAL
  Filled 2021-07-15 (×19): qty 1

## 2021-07-15 MED ORDER — ACETAMINOPHEN 325 MG PO TABS
325.0000 mg | ORAL_TABLET | ORAL | Status: DC | PRN
  Administered 2021-07-24: 650 mg via ORAL
  Filled 2021-07-15: qty 2

## 2021-07-15 MED ORDER — AMLODIPINE BESYLATE 5 MG PO TABS: 5.0000 mg | ORAL_TABLET | Freq: Every day | ORAL | Status: DC

## 2021-07-15 NOTE — Progress Notes (Signed)
PMR Admission Coordinator Pre-Admission Assessment   Patient: Michelle Bauer is an 81 y.o., female MRN: [id number] DOB: 05/05/1941 Height:   Weight:     Insurance Information HMO:     PPO:      PCP:      IPA:      80/20:      OTHER:  PRIMARY: Medicare A/B      Policy#: [id number]      Subscriber:  CM Name:       Phone#:      Fax#:  Pre-Cert#: verified Civil engineer, contracting:  Benefits:  Phone #:      Name:  Eff. Date: 03/20/07 A and B     Deduct: $1600      Out of Pocket Max: n/a      Life Max: n/a CIR: 100%      SNF: 20 full days Outpatient: 80%     Co-Ins: 20% Home Health: 100%      Co-Pay:  DME: 80%     Co-Ins: 20% Providers:  [employer] [place]      Policy#: [id number]    Phone#: [phone]   Financial Counselor:       Phone#:    The Data Collection Information Summary for patients in Inpatient Rehabilitation Facilities with attached Privacy Act [place] Records was provided and verbally reviewed with: Patient and Family   Emergency Contact Information Contact Information       Name Relation Home Work Mobile    [place] Daughter [phone]        Samson Frederic Daughter     [phone]           Current Medical History  Patient Admitting Diagnosis: R thalamic ICH   History of Present Illness: Michelle Bauer is a 81 year old female with history of HTN, PAF (eliquis), anxiety/depression, and scoliosis who was admitted via [facility] on 07/06/21 after fall at her ALF with difficulty speaking and left sided weakness. CT head showed Right thalamic ICH with multiple soft tissue contusions about scalp vertex and 1.6 cm nodular lesion at right temporal scalp. Bleed felt to be hypertensive in setting of eliquis and she was started on Cleveprex as SPB > 220 and eliquis reversed with Andexxa.  She was also found to have E coli UTI  and started on macrodantin, switched to ceftriaxone, then switched to keflex for treatment. Follow up CTA Head/neck showed no  change in thalamic hemorrhage with mild surrounding edema, no LVO or AVM.  2D echo showed EF 24-26% with G1 diastolic dysfunction. She developed SOB with hypoxia on 01/20 and CXR revealed chronic hypoventilation in left lower lobe with question of increase in left pleural effusion. CTA chest done and was negative for PE, cosolidative opacities in left lower lobe likely atelectasis without pleural effusion, and incidental finding of enlarged aortic arch-3 cm with recs for yearly CTA or MRA for monitoring. Dr. Leonie Man recommends holding [facility] for now. Confusion resolving with improvement in activity and cleviprex weaned off with recs for SBP< 160.  Klonopin resumed to help manage anxiety/sleep wake disruption. Has been weaned down to 2 L O2 per [place]. She continues to have left hemiplegia with sensory deficits, left body inattention, SLUMS score 12/30 and working on balance at EOB. Family feels cognition is baseline.  CIR recommended due to functional decline.     Complete NIHSS TOTAL: 11   Patient's medical record from [facility] has been reviewed by the rehabilitation admission  Depression   ° Essential hypertension   ° Hyperlipidemia   ° PAF (paroxysmal atrial fibrillation) (HCC)   ° a. 02/2020 Post-op Afib after R Femur FX and IM nail (also hypokalemic)-->Amio/Eliquis (CHA2DS2VASc = 4).  ° ° °Has the patient had major surgery during 100 days prior to admission? No ° °Family History   °family history includes Alcohol abuse in her father; Colon cancer in her sister; Diabetes in her brother; Heart attack in her father; Stroke in her mother and sister. ° °Current Medications ° °Current Facility-Administered Medications:  °  0.9 %  sodium chloride infusion, , Intravenous, Continuous, Dahal, Binaya, MD, Last Rate: 75 mL/hr at 07/14/21 2146, New Bag at 07/14/21 2146 °  acetaminophen (TYLENOL) tablet 650 mg, 650 mg, Oral, Q4H PRN, 650 mg at  07/12/21 2224 **OR** acetaminophen (TYLENOL) 160 MG/5ML solution 650 mg, 650 mg, Per Tube, Q4H PRN **OR** acetaminophen (TYLENOL) suppository 650 mg, 650 mg, Rectal, Q4H PRN, Kirkpatrick, McNeill P, MD °  amiodarone (PACERONE) tablet 200 mg, 200 mg, Oral, Daily, Shafer, Devon, NP, 200 mg at 07/14/21 1044 °  amLODipine (NORVASC) tablet 2.5 mg, 2.5 mg, Oral, Daily, Dahal, Binaya, MD, 2.5 mg at 07/14/21 1046 °  bacitracin ointment, , Topical, PRN, Powell, Lisa K, RPH °  bisacodyl (DULCOLAX) suppository 10 mg, 10 mg, Rectal, Daily, Dahal, Binaya, MD °  clonazePAM (KLONOPIN) tablet 0.5 mg, 0.5 mg, Oral, BID, Shafer, Devon, NP, 0.5 mg at 07/14/21 2140 °  clonazePAM (KLONOPIN) tablet 0.5 mg, 0.5 mg, Oral, BID PRN, Shafer, Devon, NP, 0.5 mg at 07/11/21 0051 °  enoxaparin (LOVENOX) injection 40 mg, 40 mg, Subcutaneous, Daily, Sethi, Pramod S, MD, 40 mg at 07/14/21 1047 °  escitalopram (LEXAPRO) tablet 5 mg, 5 mg, Oral, q morning, Krishnan, Gokul, MD, 5 mg at 07/14/21 1045 °  hydrALAZINE (APRESOLINE) injection 20 mg, 20 mg, Intravenous, Q6H PRN, Shafer, Devon, NP, 20 mg at 07/14/21 2323 °  labetalol (NORMODYNE) injection 10 mg, 10 mg, Intravenous, Q10 min PRN, Shafer, Devon, NP, 10 mg at 07/15/21 0354 °  MEDLINE mouth rinse, 15 mL, Mouth Rinse, BID, Kirkpatrick, McNeill P, MD, 15 mL at 07/14/21 2226 °  metoprolol tartrate (LOPRESSOR) tablet 25 mg, 25 mg, Oral, BID, Krishnan, Gokul, MD, 25 mg at 07/14/21 2140 °  ondansetron (ZOFRAN) injection 4 mg, 4 mg, Intravenous, Q6H PRN, Dahal, Binaya, MD °  pantoprazole (PROTONIX) EC tablet 40 mg, 40 mg, Oral, QHS, Sethi, Pramod S, MD, 40 mg at 07/14/21 2140 °  polyethylene glycol (MIRALAX / GLYCOLAX) packet 17 g, 17 g, Oral, Daily, Krishnan, Gokul, MD °  senna-docusate (Senokot-S) tablet 1 tablet, 1 tablet, Oral, Daily PRN, Krishnan, Gokul, MD, 1 tablet at 07/11/21 0420 °  traMADol (ULTRAM) tablet 50 mg, 50 mg, Oral, Q6H PRN, Shafer, Devon, NP, 50 mg at 07/11/21 0051 ° °Patients Current  Diet:  °Diet Order   ° °       °  Diet NPO time specified  Diet effective now       °  °  Diet - low sodium heart healthy       °  ° °  °  ° °  ° ° °Precautions / Restrictions °Precautions °Precautions: Fall °Precaution Comments: SBP <160, hx of severe scoliosis, L inattention, L hemiplegia °Restrictions °Weight Bearing Restrictions: No °Other Position/Activity Restrictions: scoliosis  ° °Has the patient had 2 or more falls or a fall with injury in the past year? Yes ° °Prior Activity Level °Household: mod I with RW   Precaution Comments: SBP <160, hx of severe scoliosis, L inattention, L hemiplegia Restrictions Weight Bearing Restrictions: No Other Position/Activity Restrictions: scoliosis    Has the patient had 2 or more falls or a fall with injury in the past year? Yes   Prior Activity Level Household: mod I with RW in ALF, could walk to dining room most days, mod I with ADLs   Prior Functional Level Self Care: Did the patient need help bathing, dressing, using the toilet or eating? Independent   Indoor Mobility: Did the patient need assistance with walking from room to room (with or without device)? Independent   Stairs: Did the patient need assistance with internal or external stairs (with or without device)? Needed some help   Functional Cognition: Did the patient need help planning regular tasks such as shopping or remembering to take medications? Dependent   Patient Information Are you of Hispanic, Latino/a,or Spanish origin?: A. No, not of Hispanic, Latino/a, or Spanish origin What is your race?: A. White Do you need or want an interpreter to communicate with a doctor or health care staff?: 0. No   Patient's Response To:  Health Literacy and Transportation Is the patient able to respond to health literacy and transportation needs?: No Health Literacy - How often do you need to have someone help you when you read instructions, pamphlets, or other written material from your doctor or pharmacy?: Patient unable to respond In the past 12 months, has lack of transportation kept you from medical appointments or from getting medications?: No (daughter responds) In the past 12 months, has lack of transportation kept you from meetings, work, or  from getting things needed for daily living?: No (daughter responds)   Development worker, international aid / Equipment Home Equipment: Conservation officer, nature (2 wheels), Hand held shower head, Grab bars - tub/shower, Grab bars - toilet, Shower seat   Prior Device Use: Indicate devices/aids used by the patient prior to current illness, exacerbation or injury? Walker   Current Functional Level Cognition   Arousal/Alertness: Awake/alert Overall Cognitive Status: History of cognitive impairments - at baseline Difficult to assess due to: Hard of hearing/deaf Orientation Level: Oriented to person, Oriented to place General Comments: delayed response time, delayed processing however improving compared to initial eval, pt oriented to date Attention: Focused, Sustained Focused Attention: Appears intact Sustained Attention: Appears intact Memory: Impaired Memory Impairment: Decreased recall of new information, Decreased short term memory, Retrieval deficit (Immediate: 5/5 with multiple repetitions; delayed: 1/5; 4/4 with cues) Awareness: Appears intact Problem Solving: Appears intact Executive Function: Sequencing, Technical brewer: Impaired Sequencing Impairment: Verbal complex (clock drawing: 2/4; pt stated she did not know what to do after inserting numbers) Organizing: Impaired Organizing Impairment: Verbal complex (backward digit span: 0/2)    Extremity Assessment (includes Sensation/Coordination)   Upper Extremity Assessment: LUE deficits/detail LUE Deficits / Details: no sensation, no movement LUE Sensation: decreased light touch LUE Coordination: decreased fine motor, decreased gross motor  Lower Extremity Assessment: Defer to PT evaluation LLE Deficits / Details: Grossly 2+/5 with 3-/5 for knee extension sitting EOB LLE Sensation: decreased light touch, decreased proprioception LLE Coordination: decreased fine motor, decreased gross motor     ADLs   Overall ADL's : Needs  assistance/impaired Eating/Feeding: Set up Grooming: Wash/dry hands, Wash/dry face, Brushing hair, Sitting, Moderate assistance Grooming Details (indicate cue type and reason): unable to use LUE to assist with self care tasks Upper Body Bathing: Moderate assistance, Sitting Lower Body Bathing: Total assistance, Bed level Upper Body Dressing : Moderate  assistance, Sitting Upper Body Dressing Details (indicate cue type and reason): verbal cues to attend to left, changed gown Lower Body Dressing: Total assistance, +2 for physical assistance, Sit to/from stand Toilet Transfer: Maximal assistance, +2 for physical assistance, +2 for safety/equipment, Stand-pivot, BSC/3in1 Toilet Transfer Details (indicate cue type and reason): good push up from R side Toileting- Clothing Manipulation and Hygiene: Total assistance, +2 for physical assistance, +2 for safety/equipment, Sit to/from stand Toileting - Clothing Manipulation Details (indicate cue type and reason): Pt able to maintain standing for significant timeframe for 3rd person to complete peri care Functional mobility during ADLs: Maximal assistance, +2 for physical assistance, +2 for safety/equipment (SPT face to face 2 person) General ADL Comments: grooming and gown change performed seated on EOB with assistance of another for sitting balance     Mobility   Overal bed mobility: Needs Assistance Bed Mobility: Rolling, Sidelying to Sit Rolling: Min assist Sidelying to sit: Max assist, Mod assist, HOB elevated Supine to sit: Max assist General bed mobility comments: directional verbal cues, minA to achieve full log roll, maxA for trunk elevation, pt with improved effort to use R LE in attempt to bring L LE off EOB     Transfers   Overall transfer level: Needs assistance Equipment used: 2 person hand held assist (face to face with left knee blocked) Transfers: Sit to/from Stand, Bed to chair/wheelchair/BSC Sit to Stand: Mod assist, +2 physical  assistance (left kneed blocked) Bed to/from chair/wheelchair/BSC transfer type:: Step pivot Stand pivot transfers: Max assist, +2 physical assistance, +2 safety/equipment Step pivot transfers: Max assist, +2 physical assistance General transfer comment: patient shuffles right foot into position, maxA for weight shifting, maxA for L LE mangement     Ambulation / Gait / Stairs / Wheelchair Mobility   Ambulation/Gait General Gait Details: unable at this time     Posture / Balance Dynamic Sitting Balance Sitting balance - Comments: min assist for sitting balance.  Addressed posture alignment and pushing up with RUE Balance Overall balance assessment: Needs assistance Sitting-balance support: Single extremity supported, Feet supported Sitting balance-Leahy Scale: Poor Sitting balance - Comments: min assist for sitting balance.  Addressed posture alignment and pushing up with RUE Postural control: Left lateral lean Standing balance support: Bilateral upper extremity supported Standing balance-Leahy Scale: Zero Standing balance comment: maxA+2 to maintain standing     Special needs/care consideration Oxygen 2L (weaning) and Skin abrasions to face from falls    Previous Home Environment (from acute therapy documentation) Type of Home: Assisted living   Discharge Living Setting Plans for Discharge Living Setting: Other (Comment) Type of Home at Discharge: [place] Name at Discharge: [facility] Dis[facility] Bathroom Accessibility: Yes How Accessible: Accessible via walker   Social/Family/Support Systems Anticipated Caregiver: caregivers at ALF and daughters [name] and Carrizo Anticipated Caregiver's Contact Information: [name] [phone]; [name] [phone] Ability/Limitations of Caregiver: both work, can check on pt at ALF daily and can plan to hire caregivers to assist at ALF if that is allowed; otherwise will be decrease BOC for ST SNF Caregiver Availability:  24/7 Discharge Plan Discussed with Primary Caregiver: Yes Is Caregiver In Agreement with Plan?: Yes Does Caregiver/Family have Issues with Lodging/Transportation while Pt is in Rehab?: No   Goals Patient/Family Goal for Rehab: PT/OT min assist w/c level, SLP n/a (pt at cognitive baseline per family) Expected length of stay: 14-16 days Additional Information: facility can provide min assist at w/c level, otherwise will need to have hired caregivers (if allowed) or pursue  ST SNF Pt/Family Agrees to Admission and willing to participate: Yes Program Orientation Provided & Reviewed with Pt/Caregiver Including Roles  & Responsibilities: Yes   Decrease burden of Care through IP rehab admission: Decrease number of caregivers   Possible need for SNF placement upon discharge: Potentially.  Pt's ALF can provide up to min assist w/c level for patient.  If she requires more assist and ALF will not allow private caregivers to provide that in their facility then pt may require a short term SNF placement for extended rehab.     Patient Condition: I have reviewed medical records from [facility], spoken with  toc team , and patient and daughter. I met with patient at the bedside for inpatient rehabilitation assessment.  Patient will benefit from ongoing PT and OT, can actively participate in 3 hours of therapy a day 5 days of the week, and can make measurable gains during the admission.  Patient will also benefit from the coordinated team approach during an Inpatient Acute Rehabilitation admission.  The patient will receive intensive therapy as well as Rehabilitation physician, nursing, social worker, and care management interventions.  Due to safety, skin/wound care, disease management, medication administration, pain management, and patient education the patient requires 24 hour a day rehabilitation nursing.  The patient is currently mod to max +2 with mobility and basic ADLs.  Discharge setting and therapy post  discharge at  ALF versus SNF  is anticipated.  Patient has agreed to participate in the Acute Inpatient Rehabilitation Program and will admit today.   Preadmission Screen Completed By:  Michel Santee, PT, DPT 07/15/2021 10:42 AM ______________________________________________________________________   Discussed status with Dr. Ranell Patrick on 07/15/21  at 10:51 AM  and received approval for admission today.   Admission Coordinator:  Michel Santee, PT, DPT time 10:51 AM Sudie Grumbling 07/15/21     Assessment/Plan: Diagnosis: ICH Does the need for close, 24 hr/day Medical supervision in concert with the patient's rehab needs make it unreasonable for this patient to be served in a less intensive setting? Yes Co-Morbidities requiring supervision/potential complications: GAD, constipation, insomnia, essential HTN, afib Due to bladder management, bowel management, safety, skin/wound care, disease management, medication administration, pain management, and patient education, does the patient require 24 hr/day rehab nursing? Yes Does the patient require coordinated care of a physician, rehab nurse, PT, OT, and SLP to address physical and functional deficits in the context of the above medical diagnosis(es)? Yes Addressing deficits in the following areas: balance, endurance, locomotion, strength, transferring, bowel/bladder control, bathing, dressing, feeding, grooming, toileting, cognition, and psychosocial support Can the patient actively participate in an intensive therapy program of at least 3 hrs of therapy 5 days a week? Yes The potential for patient to make measurable gains while on inpatient rehab is excellent Anticipated functional outcomes upon discharge from inpatient rehab: min assist PT, supervision OT, supervision SLP Estimated rehab length of stay to reach the above functional goals is: 12-16 days Anticipated discharge destination: Home 10. Overall Rehab/Functional Prognosis: good     MD  Signature: Leeroy Cha, MD

## 2021-07-15 NOTE — Progress Notes (Addendum)
Inpatient Rehab Admissions Coordinator:   I have a bed for patient to admit to CIR today.  Awaiting final confirmation from Dr. Pola Corn.  Will let pt/family and [facility] team know.   1350: Okay to admit to CIR per Dr. Pola Corn.  Will let pt/family know.   Estill Dooms, PT, DPT Admissions Coordinator [phone] 07/15/21  10:41 AM

## 2021-07-15 NOTE — Discharge Summary (Signed)
Physician Discharge Summary  KHAMONI LEVENE [id number] DOB: 1940-07-11 DOA: 07/06/2021  PCP: Kirk Ruths, MD  Admit date: 07/06/2021 Discharge date: 07/15/2021  Admitted From: Home Discharge disposition: CIR   Code Status: DNR   Discharge Diagnosis:   Principal Problem:   Intracranial hemorrhage ([place]) Active Problems:   Generalized anxiety disorder   Essential hypertension   Paroxysmal A-fib ([place])   Stroke (cerebrum) ([place])   Urinary tract infection   Hypoxia   Normocytic anemia  Chief complaint: Found in the bathroom floor  Brief narrative: BRISEIDA CHAUDOIN is a 81 y.o. female with PMH significant for A. fib on Eliquis, HTN, HLD, anxiety/depression Patient was brought to the [facility] ED on 07/06/2021 from an assisted living facility facility.  At the facility, a thud was heard and patient was found on the bathroom floor with her head against a wall and bleeding to the left side of the eye. EMS noted left-sided deficits with weakness, left facial droop and slurred speech  In the ED, she was unable to lift her left arm, left leg, had gaze to the right, she had slurred speech with slight facial droop noted on the left side of the face. CT head showed a thalamic intraparenchymal hemorrhage Patient was transferred to [facility] for neurology evaluation and further management.  Stroke work-up completed. Physical therapy recommended inpatient rehab.  Subjective: Patient was seen and examined this morning.  Lying on bed.  Not in distress.  No new symptoms.  Daughter at bedside.  We discussed about her x-ray findings from yesterday.  Patient is tolerating diet.  Has no nausea, vomiting.  Hospital course Intracranial hemorrhage ([place]) -CT scan showed acute intraparenchymal hemorrhage in the right thalamus.  Patient was admitted to the stroke service.   -Stroke work-up completed.  Neurology consult appreciated -A1c 5.3, LDL 103 -Echo showed normal systolic function,  grade 1 diastolic dysfunction -Currently off any antiplatelet or anticoagulation. -Currently not on statin. -Current deficits include significant left-sided weakness -PT eval obtained. CIR recommended  Paroxysmal A-fib ([place])- (present on admission) -Noted to be on amiodarone, metoprolol and Eliquis prior to admission. -Eliquis on hold due to intraparenchymal hemorrhage.   -Remains on metoprolol and amiodarone.     Acute respiratory failure with hypoxia -Patient has been requiring supplemental oxygen 2 to 3 L by nasal cannula since 1/20.   -CT chest did not show any pulmonary embolism or effusion but showed bilateral lower lobe atelectasis.   -Continue supplemental oxygen, incentive spirometry  -Wean down as tolerated.  E. coli UTI  -Completed 5-day course of Keflex.    Essential hypertension -Currently on metoprolol and amlodipine.  Continue to monitor blood pressure trend.   Generalized anxiety disorder -Continue Lexapro and Klonopin.  Distended abdomen -Early in the course, patient had diarrhea which improved.  Has distended abdomen.  abdominal x-ray on 1/26 showed moderate stool burden and also possibility of gastric outlet obstruction.  However clinically patient has not had any symptoms of nausea, vomiting.  She is tolerating diet.  General surgery consultation was obtained today.  No need of surgical intervention at this time.  We will discharge the patient to CIR for now.  If any symptoms to arise, GI/surgical consultation can be called.    Mobility: Encourage ambulation. Living condition: Assisted living facility facility Goals of care:   Code Status: DNR  Nutritional status: There is no height or weight on file to calculate BMI.      Discharge Medications:   Allergies as  of 07/15/2021       Reactions   Penicillins Other (See Comments)   Has patient had a PCN reaction causing immediate rash, facial/tongue/throat swelling, SOB or lightheadedness with hypotension:  Unknown Has patient had a PCN reaction causing severe rash involving mucus membranes or skin necrosis: Unknown Has patient had a PCN reaction that required hospitalization: Unknown Has patient had a PCN reaction occurring within the last 10 years: No If all of the above answers are "NO", then may proceed with Cephalosporin use.   Sulfa Antibiotics Other (See Comments)   Unknown reaction        Medication List     STOP taking these medications    apixaban 5 MG Tabs tablet Commonly known as: ELIQUIS   Potassium Gluconate 550 MG Tabs       TAKE these medications    acetaminophen 500 MG tablet Commonly known as: TYLENOL Take 2 tablets (1,000 mg total) by mouth every 6 (six) hours as needed for mild pain. Scheduled 8am 12pm 5pm What changed:  when to take this reasons to take this   amiodarone 200 MG tablet Commonly known as: PACERONE Take 1 tablet (200 mg total) by mouth daily. What changed: when to take this   amLODipine 2.5 MG tablet Commonly known as: NORVASC Take 1 tablet (2.5 mg total) by mouth daily. Start taking on: July 16, 2021   cholecalciferol 25 MCG (1000 UNIT) tablet Commonly known as: VITAMIN D3 Take 2,000 Units by mouth every morning.   clonazePAM 0.5 MG tablet Commonly known as: KLONOPIN Take 1 tablet (0.5 mg total) by mouth 2 (two) times daily. What changed:  when to take this additional instructions   clonazePAM 0.5 MG tablet Commonly known as: KLONOPIN Take 1 tablet (0.5 mg total) by mouth 2 (two) times daily as needed (agitation, anxiety). What changed: You were already taking a medication with the same name, and this prescription was added. Make sure you understand how and when to take each.   escitalopram 5 MG tablet Commonly known as: LEXAPRO TAKE 1 TABLET(5 MG) BY MOUTH DAILY What changed:  how much to take how to take this when to take this additional instructions   FISH OIL PO Take 1 capsule by mouth daily with lunch.    metoprolol tartrate 25 MG tablet Commonly known as: LOPRESSOR Take 1 tablet (25 mg total) by mouth 2 (two) times daily. What changed: how much to take   multivitamin with minerals Tabs tablet Take 1 tablet by mouth every morning. Women's 50 +   pantoprazole 40 MG tablet Commonly known as: PROTONIX Take 1 tablet (40 mg total) by mouth at bedtime.   polyethylene glycol 17 g packet Commonly known as: MIRALAX / GLYCOLAX Take 17 g by mouth daily.   senna-docusate 8.6-50 MG tablet Commonly known as: Senokot-S Take 1 tablet by mouth daily as needed for mild constipation or moderate constipation.   traMADol 50 MG tablet Commonly known as: ULTRAM Take 1 tablet (50 mg total) by mouth every 6 (six) hours as needed for moderate pain. What changed:  how much to take when to take this reasons to take this   vitamin B-12 500 MCG tablet Commonly known as: CYANOCOBALAMIN Take 1,000 mcg by mouth every morning.               Discharge Care Instructions  (From admission, onward)           Start     Ordered   07/15/21 0000  Discharge wound care:        07/15/21 1352            Wound care:   Incision (Closed) 03/10/20 Hip Right (Active)  Date First Assessed/Time First Assessed: 03/10/20 1443   Location: Hip  Location Orientation: Right    Assessments 03/10/2020  3:14 PM 03/17/2020  8:30 AM  Dressing Type Honeycomb Honeycomb  Dressing Clean;Dry;Intact Clean;Dry;Intact  Site / Wound Assessment Dressing in place / Unable to assess Dressing in place / Unable to assess  Drainage Amount None --     No Linked orders to display     Incision (Closed) 03/10/20 Thigh Right (Active)  Date First Assessed/Time First Assessed: 03/10/20 1448   Location: Thigh  Location Orientation: Right    Assessments 03/10/2020  3:14 PM 03/17/2020  8:30 AM  Dressing Type Honeycomb --  Dressing Clean;Dry;Intact Clean;Dry;Intact  Site / Wound Assessment Dressing in place / Unable to assess  Dressing in place / Unable to assess  Drainage Amount None --     No Linked orders to display     Incision (Closed) 03/10/20 Leg Right (Active)  Date First Assessed/Time First Assessed: 03/10/20 1448   Location: Leg  Location Orientation: Right    Assessments 03/10/2020  3:14 PM 03/17/2020  8:30 AM  Dressing Type Honeycomb --  Dressing Dry;Clean;Intact Clean;Dry;Intact  Site / Wound Assessment Dressing in place / Unable to assess --  Drainage Amount None --     No Linked orders to display     Wound / Incision (Open or Dehisced) 07/06/21 Non-pressure wound Face Right;Upper (Active)  Date First Assessed/Time First Assessed: 07/06/21 1248   Wound Type: (c) Non-pressure wound  Location: Face  Location Orientation: Right;Upper  Present on Admission: Yes    Assessments 07/06/2021  8:00 AM 07/14/2021 10:44 AM  Dressing Type Foam - Lift dressing to assess site every shift None  Dressing Changed New New  Dressing Status Clean;Dry;Intact Clean;Intact;Dry  Dressing Change Frequency Every 3 days Every 3 days  Site / Wound Assessment Dry;Red Dry;Red  Peri-wound Assessment Intact Intact  Margins Unattached edges (unapproximated) Unattached edges (unapproximated)  Closure None None  Drainage Amount Scant None  Drainage Description Serosanguineous Serosanguineous  Treatment Cleansed;Other (Comment) Cleansed     No Linked orders to display     Wound / Incision (Open or Dehisced) 07/06/21 Non-pressure wound Face Left;Upper (Active)  Date First Assessed/Time First Assessed: 07/06/21 (c) 0700   Wound Type: Non-pressure wound  Location: Face  Location Orientation: Left;Upper  Present on Admission: Yes    Assessments 07/06/2021  8:00 AM 07/14/2021 10:44 AM  Dressing Type Foam - Lift dressing to assess site every shift None  Dressing Changed New New  Dressing Status Clean;Dry;Intact Clean;Dry  Dressing Change Frequency Every 3 days Every 3 days  Site / Wound Assessment Red;Bleeding Clean;Dry   Peri-wound Assessment Intact Intact  Margins Unattached edges (unapproximated) Attached edges (approximated)  Closure None None  Drainage Amount Minimal None  Drainage Description Sanguineous Serosanguineous  Treatment Cleansed;Other (Comment) Cleansed     No Linked orders to display    Discharge Instructions:   Discharge Instructions     Ambulatory referral to Neurology   Complete by: As directed    Follow up with Dr. Leonie Man at [facility] in 4 weeks. Need to follow up with Dr. Leonie Man to consider clinical trials. Thanks.   Call MD for:  difficulty breathing, headache or visual disturbances   Complete by: As directed    Call MD  for:  difficulty breathing, headache or visual disturbances   Complete by: As directed    Call MD for:  extreme fatigue   Complete by: As directed    Call MD for:  extreme fatigue   Complete by: As directed    Call MD for:  hives   Complete by: As directed    Call MD for:  persistant dizziness or light-headedness   Complete by: As directed    Call MD for:  persistant dizziness or light-headedness   Complete by: As directed    Call MD for:  persistant nausea and vomiting   Complete by: As directed    Call MD for:  persistant nausea and vomiting   Complete by: As directed    Call MD for:  severe uncontrolled pain   Complete by: As directed    Call MD for:  severe uncontrolled pain   Complete by: As directed    Call MD for:  temperature >100.4   Complete by: As directed    Call MD for:  temperature >100.4   Complete by: As directed    Diet - low sodium heart healthy   Complete by: As directed    Diet general   Complete by: As directed    Discharge instructions   Complete by: As directed    Please review instructions on the discharge summary  You were cared for by a hospitalist during your hospital stay. If you have any questions about your discharge medications or the care you received while you were in the hospital after you are discharged, you can  call the unit and asked to speak with the hospitalist on call if the hospitalist that took care of you is not available. Once you are discharged, your primary care physician will handle any further medical issues. Please note that NO REFILLS for any discharge medications will be authorized once you are discharged, as it is imperative that you return to your primary care physician (or establish a relationship with a primary care physician if you do not have one) for your aftercare needs so that they can reassess your need for medications and monitor your lab values. If you do not have a primary care physician, you can call [phone] for a physician referral.   Discharge instructions   Complete by: As directed    General discharge instructions:  Follow with Primary MD Kirk Ruths, MD in 7 days   Get CBC/BMP checked in next visit within 1 week by PCP or SNF MD. (We routinely change or add medications that can affect your baseline labs and fluid status, therefore we recommend that you get the mentioned basic workup next visit with your PCP, your PCP may decide not to get them or add new tests based on their clinical decision)  On your next visit with your PCP, please get your medicines reviewed and adjusted.  Please request your PCP  to go over all hospital tests, procedures, radiology results at the follow up, please get all Hospital records sent to your PCP by signing hospital release before you go home.  Activity: As tolerated with Full fall precautions use walker/cane & assistance as needed  Avoid using any recreational substances like cigarette, tobacco, alcohol, or non-prescribed drug.  If you experience worsening of your admission symptoms, develop shortness of breath, life threatening emergency, suicidal or homicidal thoughts you must seek medical attention immediately by calling 911 or calling your MD immediately  if symptoms less severe.  You must  read complete  instructions/literature along with all the possible adverse reactions/side effects for all the medicines you take and that have been prescribed to you. Take any new medicine only after you have completely understood and accepted all the possible adverse reactions/side effects.   Do not drive, operate heavy machinery, perform activities at heights, swimming or participation in water activities or provide baby sitting services if your were admitted for syncope or siezures until you have seen by Primary MD or a Neurologist and advised to do so again.  Do not drive when taking Pain medications.  Do not take more than prescribed Pain, Sleep and Anxiety Medications  Wear Seat belts while driving.  Please note You were cared for by a hospitalist during your hospital stay. If you have any questions about your discharge medications or the care you received while you were in the hospital after you are discharged, you can call the unit and asked to speak with the hospitalist on call if the hospitalist that took care of you is not available. Once you are discharged, your primary care physician will handle any further medical issues. Please note that NO REFILLS for any discharge medications will be authorized once you are discharged, as it is imperative that you return to your primary care physician (or establish a relationship with a primary care physician if you do not have one) for your aftercare needs so that they can reassess your need for medications and monitor your lab values.   Discharge wound care:   Complete by: As directed    Increase activity slowly   Complete by: As directed    Increase activity slowly   Complete by: As directed    No wound care   Complete by: As directed        Follow ups:    Follow-up Information     Garvin Fila, MD. Schedule an appointment as soon as possible for a visit in 1 month(s).   Specialties: Neurology, Radiology Why: stroke clinic Contact  information: [address] [place] [address] [phone]         Kirk Ruths, MD. Schedule an appointment as soon as possible for a visit in 3 week(s).   Specialty: Internal Medicine Contact information: [place] [address] [phone]         Kirk Ruths, MD Follow up.   Specialty: Internal Medicine Contact information: [place] [address] [phone]         Minna Merritts, MD .   Specialty: Cardiology Contact information: [place] [address] [phone]                 Discharge Exam:   Vitals:   07/15/21 0324 07/15/21 0751 07/15/21 1035 07/15/21 1141  BP: (!) 164/60 (!) 179/66 (!) 160/82 (!) 154/64  Pulse: 68 71 72 69  Resp: (!) 24 20  20   Temp: (!) 97.3 F (36.3 C) 98.3 F (36.8 C)  97.9 F (36.6 C)  TempSrc: Oral Oral  Oral  SpO2: 95% 96%  97%    There is no height or weight on file to calculate BMI.  General exam: Pleasant elderly Caucasian female.  Not in physical distress.  On low-flow oxygen Skin: No rashes, lesions or ulcers. HEENT: Atraumatic, normocephalic, no obvious bleeding Lungs: Diminished air entry in both bases CVS: Regular rate and rhythm,  1+ murmur in aortic area GI/Abd soft, nontender, mildly distended, bowel sound present CNS: Alert, awake, slow to respond, dense left hemiparesis Psychiatry: Sad affect Extremities: No pedal edema, no calf tenderness  Time coordinating discharge: 35 minutes   The results of significant diagnostics from this hospitalization (including imaging, microbiology, ancillary and laboratory) are listed below for reference.    Procedures and Diagnostic Studies:   CT ANGIO HEAD NECK W WO CM  Result Date: 07/06/2021 CLINICAL DATA:  Neuro deficit, stroke suspected EXAM: CT ANGIOGRAPHY HEAD AND NECK TECHNIQUE: Multidetector CT  imaging of the head and neck was performed using the standard protocol during bolus administration of intravenous contrast. Multiplanar CT image reconstructions and MIPs were obtained to evaluate the vascular anatomy. Carotid stenosis measurements (when applicable) are obtained utilizing NASCET criteria, using the distal internal carotid diameter as the denominator. RADIATION DOSE REDUCTION: This exam was performed according to the departmental dose-optimization program which includes automated exposure control, adjustment of the mA and/or kV according to patient size and/or use of iterative reconstruction technique. CONTRAST:  84mL OMNIPAQUE IOHEXOL 350 MG/ML SOLN COMPARISON:  No prior CTA, correlation is made with 07/06/2021 CT FINDINGS: CT HEAD FINDINGS Evaluation is somewhat limited by motion artifact. Brain: Redemonstrated acute parenchymal hemorrhage measuring 1.8 x 1.5 x 3.2 cm, centered in the right thalamus, unchanged from the prior exam. There is mild surrounding vasogenic edema without significant regional mass effect. No evidence of intraventricular extension. No new acute hemorrhage. No acute infarct, mass, mass effect, or midline shift. No hydrocephalus or extra-axial collection. Vascular: Please see CTA findings below Skull: Previously noted right temporal scalp lesion is not well seen on the current exam. No acute osseous abnormality. Hyperostosis frontalis. Sinuses: Imaged portions are clear. Orbits: Status post bilateral lens replacements. Review of the MIP images confirms the above findings CTA NECK FINDINGS Aortic arch: Standard branching. Imaged portion shows no evidence of aneurysm or dissection. No significant stenosis of the major arch vessel origins. Aortic atherosclerosis. Calcifications are noted in the proximal left subclavian artery, which do not appear hemodynamically significant. Right carotid system: No evidence of dissection, stenosis (50% or greater) or occlusion. Left carotid  system: No evidence of dissection, stenosis (50% or greater) or occlusion. Tortuosity, with extreme lateral course of the left internal carotid artery (series 10, image 188). Vertebral arteries: No evidence of dissection, stenosis (50% or greater) or occlusion. Skeleton: Degenerative changes in the cervical spine, worst at C5-C6 and C6-C7. No acute osseous abnormality. Status post median sternotomy. Other neck: Negative. Upper chest: No focal pulmonary opacity or pleural effusion. Review of the MIP images confirms the above findings CTA HEAD FINDINGS Anterior circulation: Both internal carotid arteries are patent to the termini, with moderate circumferential calcifications, which cause mild stenosis. A1 segments patent. Normal anterior communicating artery. Anterior cerebral arteries are patent to their distal aspects. No M1 stenosis or occlusion. Normal MCA bifurcations. Distal MCA branches perfused and symmetric. No evidence of active extravasation into the right thalamic hemorrhage. Posterior circulation: Vertebral arteries patent to the vertebrobasilar junction without stenosis. Posterior inferior cerebral arteries patent bilaterally. Basilar patent to its distal aspect. Superior cerebellar arteries patent bilaterally. Bilateral P1 segments originate from the basilar artery. PCAs perfused to their distal aspects without stenosis. The bilateral posterior communicating arteries are not visualized. Venous sinuses: As permitted by contrast timing, patent. Anatomic variants: None significant Review of the MIP images confirms the above findings IMPRESSION: 1. Unchanged appearance of the previously noted acute intraparenchymal hemorrhage in the right thalamus,  with unchanged surrounding vasogenic edema but without significant regional mass effect. 2.  No hemodynamically significant stenosis in the neck. 3. No intracranial large vessel occlusion or significant stenosis. No evidence of active extravasation into the right  thalamic hemorrhage. Electronically Signed   By: Merilyn Baba M.D.   On: 07/06/2021 19:37   ECHOCARDIOGRAM COMPLETE  Result Date: 07/06/2021    ECHOCARDIOGRAM REPORT   Patient Name:   SHAMARRIA WACHT Date of Exam: 07/06/2021 Medical Rec #:  [id number]      Height:       63.0 in Accession #:    [id number]     Weight:       148.1 lb Date of Birth:  Nov 23, 1940     BSA:          1.702 m Patient Age:    81 years       BP:           146/56 mmHg Patient Gender: F              HR:           69 bpm. Exam Location:  Inpatient Procedure: 2D Echo, Cardiac Doppler, Color Doppler and Intracardiac            Opacification Agent Indications:    STROKE  History:        Patient has prior history of Echocardiogram examinations, most                 recent 03/13/2020. Risk Factors:Hypertension. HLD/ PAF.  Sonographer:    Beryle Beams Referring Phys: Merlín.Osler MCNEILL P [place]  1. Left ventricular ejection fraction, by estimation, is 55 to 60%. The left ventricle has normal function. The left ventricle has no regional wall motion abnormalities. Left ventricular diastolic parameters are consistent with Grade I diastolic dysfunction (impaired relaxation).  2. Right ventricular systolic function is normal. The right ventricular size is normal.  3. Left atrial size was severely dilated.  4. The mitral valve is normal in structure. No evidence of mitral valve regurgitation. No evidence of mitral stenosis.  5. The aortic valve is normal in structure. There is mild calcification of the aortic valve. There is mild thickening of the aortic valve. Aortic valve regurgitation is mild. Aortic valve sclerosis/calcification is present, without any evidence of aortic stenosis. Aortic valve area, by VTI measures 1.99 cm. Aortic valve mean gradient measures 5.0 mmHg. Aortic valve Vmax measures 1.48 m/s.  6. The inferior vena cava is normal in size with greater than 50% respiratory variability, suggesting right atrial pressure of 3 mmHg.  FINDINGS  Left Ventricle: Left ventricular ejection fraction, by estimation, is 55 to 60%. The left ventricle has normal function. The left ventricle has no regional wall motion abnormalities. The left ventricular internal cavity size was normal in size. There is  no left ventricular hypertrophy. Left ventricular diastolic parameters are consistent with Grade I diastolic dysfunction (impaired relaxation). Right Ventricle: IVC NOT SEEN. The right ventricular size is normal. No increase in right ventricular wall thickness. Right ventricular systolic function is normal. Left Atrium: Left atrial size was severely dilated. Right Atrium: Right atrial size was normal in size. Pericardium: There is no evidence of pericardial effusion. Mitral Valve: The mitral valve is normal in structure. No evidence of mitral valve regurgitation. No evidence of mitral valve stenosis. MV peak gradient, 5.0 mmHg. The mean mitral valve gradient is 3.0 mmHg. Tricuspid Valve: The tricuspid valve is normal in structure. Tricuspid valve regurgitation  is trivial. No evidence of tricuspid stenosis. Aortic Valve: The aortic valve is normal in structure. There is mild calcification of the aortic valve. There is mild thickening of the aortic valve. Aortic valve regurgitation is mild. Aortic valve sclerosis/calcification is present, without any evidence of aortic stenosis. Aortic valve mean gradient measures 5.0 mmHg. Aortic valve peak gradient measures 8.8 mmHg. Aortic valve area, by VTI measures 1.99 cm. Pulmonic Valve: The pulmonic valve was normal in structure. Pulmonic valve regurgitation is not visualized. No evidence of pulmonic stenosis. Aorta: The aortic root is normal in size and structure. Venous: The inferior vena cava is normal in size with greater than 50% respiratory variability, suggesting right atrial pressure of 3 mmHg. IAS/Shunts: No atrial level shunt detected by color flow Doppler.  LEFT VENTRICLE PLAX 2D LVIDd:         2.60 cm      Diastology LVIDs:         1.70 cm     LV e' medial:    5.55 cm/s LV PW:         1.10 cm     LV E/e' medial:  17.4 LV IVS:        0.80 cm     LV e' lateral:   9.02 cm/s LVOT diam:     2.00 cm     LV E/e' lateral: 10.7 LV SV:         63 LV SV Index:   37 LVOT Area:     3.14 cm  LV Volumes (MOD) LV vol d, MOD A2C: 42.6 ml LV vol d, MOD A4C: 78.2 ml LV vol s, MOD A2C: 15.6 ml LV vol s, MOD A4C: 36.1 ml LV SV MOD A2C:     27.0 ml LV SV MOD A4C:     78.2 ml LV SV MOD BP:      35.8 ml RIGHT VENTRICLE RV S prime:     13.30 cm/s TAPSE (M-mode): 1.8 cm LEFT ATRIUM             Index        RIGHT ATRIUM           Index LA diam:        3.80 cm 2.23 cm/m   RA Area:     16.60 cm LA Vol (A2C):   38.9 ml 22.85 ml/m  RA Volume:   36.50 ml  21.44 ml/m LA Vol (A4C):   81.6 ml 47.94 ml/m LA Biplane Vol: 60.3 ml 35.43 ml/m  AORTIC VALVE                     PULMONIC VALVE AV Area (Vmax):    2.08 cm      PV Vmax:       1.06 m/s AV Area (Vmean):   2.00 cm      PV Vmean:      69.800 cm/s AV Area (VTI):     1.99 cm      PV VTI:        0.166 m AV Vmax:           148.00 cm/s   PV Peak grad:  4.5 mmHg AV Vmean:          106.000 cm/s  PV Mean grad:  2.0 mmHg AV VTI:            0.318 m AV Peak Grad:      8.8 mmHg AV Mean Grad:  5.0 mmHg LVOT Vmax:         97.90 cm/s LVOT Vmean:        67.500 cm/s LVOT VTI:          0.201 m LVOT/AV VTI ratio: 0.63  AORTA Ao Root diam: 3.20 cm Ao Asc diam:  2.80 cm MITRAL VALVE                TRICUSPID VALVE MV Area (PHT): 1.95 cm     TR Peak grad:   28.5 mmHg MV Area VTI:   2.51 cm     TR Vmax:        267.00 cm/s MV Peak grad:  5.0 mmHg MV Mean grad:  3.0 mmHg     SHUNTS MV Vmax:       1.12 m/s     Systemic VTI:  0.20 m MV Vmean:      83.4 cm/s    Systemic Diam: 2.00 cm MV Decel Time: 389 msec MV E velocity: 96.80 cm/s MV A velocity: 118.00 cm/s MV E/A ratio:  0.82 Skeet Latch MD Electronically signed by Skeet Latch MD Signature Date/Time: 07/06/2021/3:23:54 PM    Final    CT HEAD CODE  STROKE WO CONTRAST  Result Date: 07/06/2021 CLINICAL DATA:  Code stroke.  Initial evaluation for acute stroke. EXAM: CT HEAD WITHOUT CONTRAST TECHNIQUE: Contiguous axial images were obtained from the base of the skull through the vertex without intravenous contrast. RADIATION DOSE REDUCTION: This exam was performed according to the departmental dose-optimization program which includes automated exposure control, adjustment of the mA and/or kV according to patient size and/or use of iterative reconstruction technique. COMPARISON:  Head CT from 03/09/2020. FINDINGS: Brain: Acute intraparenchymal hemorrhage measuring 1.8 x 1.5 x 3.2 cm seen centered at the right thalamus (estimated volume 4 mL). Surrounding vasogenic edema without significant regional mass effect. No intraventricular extension. No other acute intracranial hemorrhage. No other large vessel territory infarct. Atrophy with chronic microvascular ischemic disease noted. No mass lesion or midline shift. No hydrocephalus or extra-axial fluid collection. Vascular: No hyperdense vessel. Calcified atherosclerosis present at skull base. Skull: 1.6 cm nodular soft tissue lesion noted at the right temporal scalp, indeterminate. Possible multifocal soft tissue contusions about the scalp vertex. Calvarium intact. Sinuses/Orbits: Globes and orbital soft tissues demonstrate no acute finding. Paranasal sinuses and mastoid air cells are clear. Other: None. ASPECTS [facility] Stroke Program Early CT Score) Acute intracranial hemorrhage. IMPRESSION: 1. 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 Surrounding vasogenic edema without significant regional mass effect. A hypertensive etiology is suspected. 2. Underlying atrophy with chronic small vessel ischemic disease. 3. Multifocal soft tissue contusions about the scalp vertex. Calvarium intact. 4. 1.6 cm nodular soft tissue lesion at the right temporal scalp,  indeterminate. Correlation with physical exam recommended. Critical Value/emergent results were called by telephone at the time of interpretation on 07/06/2021 at 3:35 am to provider [facility] , who verbally acknowledged these results. Electronically Signed   By: Jeannine Boga M.D.   On: 07/06/2021 03:38     Labs:   Basic Metabolic Panel: Recent Labs  Lab 07/09/21 0250 07/10/21 0156 07/11/21 0338  NA 137 136 135  K 4.0 3.9 3.5  CL 104 102 101  CO2 26 26 23   GLUCOSE 100* 105* 102*  BUN 18 17 15   CREATININE 0.83 0.81 0.65  CALCIUM 8.6* 8.6* 8.6*   GFR Estimated Creatinine Clearance: 52.5 mL/min (by C-G  formula based on SCr of 0.65 mg/dL). Liver Function Tests: No results for input(s): AST, ALT, ALKPHOS, BILITOT, PROT, ALBUMIN in the last 168 hours. No results for input(s): LIPASE, AMYLASE in the last 168 hours. No results for input(s): AMMONIA in the last 168 hours. Coagulation profile No results for input(s): INR, PROTIME in the last 168 hours.  CBC: Recent Labs  Lab 07/09/21 0250  WBC 8.8  HGB 11.3*  HCT 35.9*  MCV 93.7  PLT 212   Cardiac Enzymes: No results for input(s): CKTOTAL, CKMB, CKMBINDEX, TROPONINI in the last 168 hours. BNP: Invalid input(s): POCBNP CBG: Recent Labs  Lab 07/10/21 0611 07/10/21 1230 07/10/21 1555 07/10/21 2111 07/11/21 0615  GLUCAP 100* 136* 122* 126* 103*   D-Dimer No results for input(s): DDIMER in the last 72 hours. Hgb A1c No results for input(s): HGBA1C in the last 72 hours. Lipid Profile No results for input(s): CHOL, HDL, LDLCALC, TRIG, CHOLHDL, LDLDIRECT in the last 72 hours. Thyroid function studies No results for input(s): TSH, T4TOTAL, T3FREE, THYROIDAB in the last 72 hours.  Invalid input(s): FREET3 Anemia work up No results for input(s): VITAMINB12, FOLATE, FERRITIN, TIBC, IRON, RETICCTPCT in the last 72 hours. Microbiology Recent Results (from the past 240 hour(s))  Resp Panel by RT-PCR (Flu A&B,  Covid) Nasopharyngeal Swab     Status: None   Collection Time: 07/06/21  4:06 AM   Specimen: Nasopharyngeal Swab; Nasopharyngeal(NP) swabs in vial transport medium  Result Value Ref Range Status   SARS Coronavirus 2 by RT PCR NEGATIVE NEGATIVE Final    Comment: (NOTE) SARS-CoV-2 target nucleic acids are NOT DETECTED.  The SARS-CoV-2 RNA is generally detectable in upper respiratory specimens during the acute phase of infection. The lowest concentration of SARS-CoV-2 viral copies this assay can detect is 138 copies/mL. A negative result does not preclude SARS-Cov-2 infection and should not be used as the sole basis for treatment or other patient management decisions. A negative result may occur with  improper specimen collection/handling, submission of specimen other than nasopharyngeal swab, presence of viral mutation(s) within the areas targeted by this assay, and inadequate number of viral copies(<138 copies/mL). A negative result must be combined with clinical observations, patient history, and epidemiological information. The expected result is Negative.  Fact Sheet for Patients:  EntrepreneurPulse.com.au  Fact Sheet for Healthcare Providers:  IncredibleEmployment.be  This test is no t yet approved or cleared by the [place] FDA and  has been authorized for detection and/or diagnosis of SARS-CoV-2 by FDA under an Emergency Use Authorization (EUA). This EUA will remain  in effect (meaning this test can be used) for the duration of the COVID-19 declaration under Section 564(b)(1) of the Act, 21 U.S.C.section 360bbb-3(b)(1), unless the authorization is terminated  or revoked sooner.       Influenza A by PCR NEGATIVE NEGATIVE Final   Influenza B by PCR NEGATIVE NEGATIVE Final    Comment: (NOTE) The Xpert Xpress SARS-CoV-2/FLU/RSV plus assay is intended as an aid in the diagnosis of influenza from Nasopharyngeal swab specimens and should  not be used as a sole basis for treatment. Nasal washings and aspirates are unacceptable for Xpert Xpress SARS-CoV-2/FLU/RSV testing.  Fact Sheet for Patients: EntrepreneurPulse.com.au  Fact Sheet for Healthcare Providers: IncredibleEmployment.be  This test is not yet approved or cleared by the [place] FDA and has been authorized for detection and/or diagnosis of SARS-CoV-2 by FDA under an Emergency Use Authorization (EUA). This EUA will remain in effect (meaning this test can be  used) for the duration of the COVID-19 declaration under Section 564(b)(1) of the Act, 21 U.S.C. section 360bbb-3(b)(1), unless the authorization is terminated or revoked.  Performed at [facility], [address] [place], [place] [address]   MRSA Next Gen by PCR, Nasal     Status: None   Collection Time: 07/06/21  6:41 AM   Specimen: Urine, Catheterized; Nasal Swab  Result Value Ref Range Status   MRSA by PCR Next Gen NOT DETECTED NOT DETECTED Final    Comment: (NOTE) The GeneXpert MRSA Assay (FDA approved for NASAL specimens only), is one component of a comprehensive MRSA colonization surveillance program. It is not intended to diagnose MRSA infection nor to guide or monitor treatment for MRSA infections. Test performance is not FDA approved in patients less than 22 years old. Performed at [facility], [place] [address] [place], [place] [address]   Urine Culture     Status: Abnormal   Collection Time: 07/06/21  8:19 AM   Specimen: Urine, Clean Catch  Result Value Ref Range Status   Specimen Description URINE, CLEAN CATCH  Final   Special Requests   Final    NONE Performed at [facility], [place] [address], [place], [place] [address]    Culture >=100,000 COLONIES/mL ESCHERICHIA COLI (A)  Final   Report Status 07/08/2021 FINAL  Final   Organism ID, Bacteria ESCHERICHIA COLI (A)  Final      Susceptibility   Escherichia coli - MIC*     AMPICILLIN >=32 RESISTANT Resistant     CEFAZOLIN <=4 SENSITIVE Sensitive     CEFEPIME <=0.12 SENSITIVE Sensitive     CEFTRIAXONE <=0.25 SENSITIVE Sensitive     CIPROFLOXACIN <=0.25 SENSITIVE Sensitive     GENTAMICIN <=1 SENSITIVE Sensitive     IMIPENEM <=0.25 SENSITIVE Sensitive     NITROFURANTOIN <=16 SENSITIVE Sensitive     TRIMETH/SULFA <=20 SENSITIVE Sensitive     AMPICILLIN/SULBACTAM 16 INTERMEDIATE Intermediate     PIP/TAZO <=4 SENSITIVE Sensitive     * >=100,000 COLONIES/mL ESCHERICHIA COLI     Signed: Ovella Manygoats  [facility] 07/15/2021, 1:52 PM

## 2021-07-15 NOTE — H&P (Signed)
Physical Medicine and Rehabilitation Admission H&P    CC: Functional decline due to  stroke.   HPI: Michelle Bauer is a 81 year old female with history of HTN, PAF-on eliquis, anxiety/depression,scoliosis who was admitted via [facility] on 07/06/21 after fall with difficulty speaking and left sided weakness at her ALF. CT head showed Right thalamic ICH with multiple soft tissue contusions about scalp vertex and 1.6 cm nodular lesion at right temporal scalp. Bleed felt to be hypertensive in setting of eliquis and she was started on Cleveprex as SPB > 220 and eliquis reversed with Andexxa.  She was also found to have E coli UTI  and started on macrodantin-->ceftriaxone-->keflex for treatment. She was transferred to [facility] for management. CTA Head/neck showed no change in thalamic hemorrhage with mild surrounding edema, no LVO or AVM.  2D echo showed EF 60-65% with grade 1 DD.    She developed SOB with hypoxia on 01/20 and CXR done revealing chronic hypoventilation LLL with question of increase in left pleural effusion. CTA chest done and was negative for PE, cosolidative opacities LLL likely atelectasis without pleural effusion and incidental finding of enlarged aortic arch-3 cm with recs for yearly CTA or MRA for monitoring. Dr. Leonie Man recommends holding [facility] till appropriate to resume. Confusion resolving with improvement in activity and cleviprex weaned off with recs for SBP< 160. Marland Kitchen Klonopin resumed to help manage anxiety/sleep wake disruption. Has been weaned down to 2 L per [place].  She developed abdominal distension with diarrhea and KUB showed some concerns of gastric obstruction. Dr. Donne Hazel consulted and no work up indicated as patient without pain or N/V and benign exam. She continues to have left hemiplegia with sensory deficits, left body inattention,  worsening of baseline cognitive deficits with delay in processing and recall- SLUMS score 12/30 and working on balance at EOB. CIR recommended due to  functional decline.  Can be accommodated at ALF if requiring min assist at [facility] level  2 daughters are very supportive and are taking turns visiting.   Review of Systems  Constitutional:  Positive for malaise/fatigue (for past 5 years). Negative for fever.  HENT:  Negative for hearing loss.   Eyes:  Positive for blurred vision (does not feel comfortable reading per family).  Respiratory:  Positive for shortness of breath. Negative for cough and stridor.   Cardiovascular:  Negative for chest pain.  Gastrointestinal:  Positive for abdominal pain and diarrhea (has issues due to anxiety). Negative for nausea.  Musculoskeletal:  Negative for myalgias.  Neurological:  Positive for speech change and weakness. Negative for dizziness and headaches.  Psychiatric/Behavioral:  Positive for depression (has good days and bad days.  Has weeks when she does not want to leave the room.). The patient does not have insomnia.     Past Medical History:  Diagnosis Date   Anxiety    Depression    Essential hypertension    Hyperlipidemia    PAF (paroxysmal atrial fibrillation) ([place])    a. 02/2020 Post-op Afib after R Femur FX and IM nail (also hypokalemic)-->Amio/Eliquis (CHA2DS2VASc = 4).    Past Surgical History:  Procedure Laterality Date   ABDOMINAL HYSTERECTOMY     BYPASS AXILLA/BRACHIAL ARTERY     CHOLECYSTECTOMY     COLONOSCOPY WITH PROPOFOL N/A 03/07/2018   Procedure: COLONOSCOPY WITH PROPOFOL;  Surgeon: Lucilla Lame, MD;  Location: [facility] ENDOSCOPY;  Service: Endoscopy;  Laterality: N/A;   INTRAMEDULLARY (IM) NAIL INTERTROCHANTERIC Right 03/10/2020   Procedure: INTRAMEDULLARY (IM) NAIL  INTERTROCHANTRIC;  Surgeon: Thornton Park, MD;  Location: [facility] ORS;  Service: Orthopedics;  Laterality: Right;    Family History  Problem Relation Age of Onset   Stroke Mother    Heart attack Father    Alcohol abuse Father    Stroke Sister    Diabetes Brother    Colon cancer Sister     Social History:  Lives at [facility]F since hip/femur fracture 2 years ago. She was independent with walker PTA. Has had decline in mobility PTA and needed supervision with showers. She  reports that she has never smoked. She has never used smokeless tobacco. She reports that she does not drink alcohol and does not use drugs.   Allergies  Allergen Reactions   Penicillins Other (See Comments)    Has patient had a PCN reaction causing immediate rash, facial/tongue/throat swelling, SOB or lightheadedness with hypotension: Unknown Has patient had a PCN reaction causing severe rash involving mucus membranes or skin necrosis: Unknown Has patient had a PCN reaction that required hospitalization: Unknown Has patient had a PCN reaction occurring within the last 10 years: No If all of the above answers are "NO", then may proceed with Cephalosporin use.   Sulfa Antibiotics Other (See Comments)    Unknown reaction    Medications Prior to Admission  Medication Sig Dispense Refill   acetaminophen (TYLENOL) 500 MG tablet Take 2 tablets (1,000 mg total) by mouth every 6 (six) hours as needed for mild pain. Scheduled 8am 12pm 5pm 30 tablet 0   amiodarone (PACERONE) 200 MG tablet Take 1 tablet (200 mg total) by mouth daily. (Patient taking differently: Take 200 mg by mouth every morning.) 90 tablet 1   [START ON 07/16/2021] amLODipine (NORVASC) 2.5 MG tablet Take 1 tablet (2.5 mg total) by mouth daily.     cholecalciferol (VITAMIN D3) 25 MCG (1000 UNIT) tablet Take 2,000 Units by mouth every morning.     clonazePAM (KLONOPIN) 0.5 MG tablet Take 1 tablet (0.5 mg total) by mouth 2 (two) times daily. 30 tablet 0   clonazePAM (KLONOPIN) 0.5 MG tablet Take 1 tablet (0.5 mg total) by mouth 2 (two) times daily as needed (agitation, anxiety). 15 tablet 0   escitalopram (LEXAPRO) 5 MG tablet TAKE 1 TABLET(5 MG) BY MOUTH DAILY (Patient taking differently: Take 5 mg by mouth every morning.) 90 tablet 1   metoprolol tartrate  (LOPRESSOR) 25 MG tablet Take 1 tablet (25 mg total) by mouth 2 (two) times daily.     Multiple Vitamin (MULTIVITAMIN WITH MINERALS) TABS tablet Take 1 tablet by mouth every morning. Women's 50 +     Omega-3 Fatty Acids (FISH OIL PO) Take 1 capsule by mouth daily with lunch.     pantoprazole (PROTONIX) 40 MG tablet Take 1 tablet (40 mg total) by mouth at bedtime.     polyethylene glycol (MIRALAX / GLYCOLAX) 17 g packet Take 17 g by mouth daily. 14 each 0   senna-docusate (SENOKOT-S) 8.6-50 MG tablet Take 1 tablet by mouth daily as needed for mild constipation or moderate constipation.     traMADol (ULTRAM) 50 MG tablet Take 1 tablet (50 mg total) by mouth every 6 (six) hours as needed for moderate pain. 20 tablet    vitamin B-12 (CYANOCOBALAMIN) 500 MCG tablet Take 1,000 mcg by mouth every morning.      Drug Regimen Review  Drug regimen was reviewed and remains appropriate with no significant issues identified  Home: Home Living Family/patient expects to be  discharged to:: Assisted living Type of Home: Assisted living Home Equipment: Conservation officer, nature (2 wheels), Hand held shower head, Grab bars - tub/shower, Grab bars - toilet, Shower seat   Functional History: Prior Function Prior Level of Function : Needs assist Mobility Comments: uses RW for mobility. Recently has not been ambulating to dining hall. Daughter stating "she's been down in the dumps" ADLs Comments: Supervision with showers.  Daughter's assist with home management, facility provides meals and meds.   Functional Status:  Mobility: Bed Mobility Overal bed mobility: Needs Assistance Bed Mobility: Rolling, Sit to Supine Rolling: Max assist Sidelying to sit: Max assist, Mod assist, HOB elevated Supine to sit: Max assist Sit to supine: Total assist General bed mobility comments: directional verbal cues, minA to achieve full log roll, maxA for trunk elevation, pt with improved effort to use R LE in attempt to bring L LE off  EOB Transfers Overall transfer level: Needs assistance Equipment used: 2 person hand held assist (face to face with left knee blocked) Transfers: Sit to/from Stand, Bed to chair/wheelchair/BSC Sit to Stand: +2 physical assistance, Max assist Bed to/from chair/wheelchair/BSC transfer type:: Stand pivot Stand pivot transfers: Max assist, +2 physical assistance, +2 safety/equipment Step pivot transfers: Max assist, +2 physical assistance General transfer comment: Patient can bear weight and move her right leg a little and needs help managing her left leg during stand pivot transfer. Ambulation/Gait General Gait Details: unable at this time   ADL: ADL Overall ADL's : Needs assistance/impaired Eating/Feeding: Set up Grooming: Wash/dry hands, Wash/dry face, Brushing hair, Sitting, Moderate assistance Grooming Details (indicate cue type and reason): unable to use LUE to assist with self care tasks Upper Body Bathing: Moderate assistance, Sitting Lower Body Bathing: Total assistance, Bed level Upper Body Dressing : Moderate assistance, Sitting Upper Body Dressing Details (indicate cue type and reason): verbal cues to attend to left, changed gown Lower Body Dressing: Total assistance, +2 for physical assistance, Sit to/from stand Toilet Transfer: Maximal assistance, +2 for physical assistance, +2 for safety/equipment, Stand-pivot, BSC/3in1 Toilet Transfer Details (indicate cue type and reason): good push up from R side Toileting- Clothing Manipulation and Hygiene: Total assistance, +2 for physical assistance, +2 for safety/equipment, Sit to/from stand Toileting - Clothing Manipulation Details (indicate cue type and reason): Pt able to maintain standing for significant timeframe for 3rd person to complete peri care Functional mobility during ADLs: Maximal assistance, +2 for physical assistance, +2 for safety/equipment (SPT face to face 2 person) General ADL Comments: grooming and gown change  performed seated on EOB with assistance of another for sitting balance   Cognition: Cognition Overall Cognitive Status: History of cognitive impairments - at baseline Arousal/Alertness: Awake/alert Orientation Level: Oriented to person, Oriented to place Year:  (2030) Month: January Day of Week: Incorrect Attention: Focused, Sustained Focused Attention: Appears intact Sustained Attention: Appears intact Memory: Impaired Memory Impairment: Decreased recall of new information, Decreased short term memory, Retrieval deficit (Immediate: 5/5 with multiple repetitions; delayed: 1/5; 4/4 with cues) Awareness: Appears intact Problem Solving: Appears intact Executive Function: Sequencing, Technical brewer: Impaired Sequencing Impairment: Verbal complex (clock drawing: 2/4; pt stated she did not know what to do after inserting numbers) Organizing: Impaired Organizing Impairment: Verbal complex (backward digit span: 0/2) Cognition Arousal/Alertness: Awake/alert Behavior During Therapy: Flat affect Overall Cognitive Status: History of cognitive impairments - at baseline General Comments: delayed response time Difficult to assess due to: Hard of hearing/deaf  Blood pressure (!) 142/67, pulse 66, temperature 97.9 F (36.6 C), temperature source Oral,  resp. rate 14, height 5\' 3"  (1.6 m), weight 71.8 kg, SpO2 94 %. Physical Exam Vitals and nursing note reviewed.  HENT:     Head:     Comments: Multiple dry scabs left temple due to healing abrasions.  Cardiac: RRR Lungs: Nonlabored, O2 via [place] Abdominal:     General: Abdomen is flat. There is no distension.     Tenderness: There is abdominal tenderness.  Neurological/MSK:    Mental Status: She is alert and oriented to person, place, and time.     Sensory: Sensory deficit present- decreased sensation in left hand    Comments: Right gaze preference with left inattention. Left hemiplegia 0/5. Right side 5/5. Soft speech with mild  dysarthria.    No results found for this or any previous visit (from the past 48 hour(s)).  DG Abd Portable 1V  Result Date: 07/14/2021 CLINICAL DATA:  Abdominal pain. EXAM: PORTABLE ABDOMEN - 1 VIEW COMPARISON:  None. FINDINGS: The stomach is dilated and air-filled. No other dilated bowel loops are seen. Air seen to the level of the rectum. There is a large amount of stool throughout the colon. No suspicious calcifications. There is scoliosis of the thoracolumbar spine. Sternotomy wires, cholecystectomy clips and right hip screw are present. IMPRESSION: 1. The stomach is dilated and air-filled. Please correlate clinically. Findings may related to gastric outlet obstruction, gastroparesis or other etiology. Electronically Signed   By: Ronney Asters M.D.   On: 07/14/2021 19:12       Medical Problem List and Plan: 1. Functional deficits secondary to IPH  -patient may shower  -ELOS/Goals: 14-18 days S-MinA  Admit to CIR 2.  Antithrombotics: -DVT/anticoagulation:  Pharmaceutical: Lovenox  -antiplatelet therapy: N/a due to thalamic bleed.  3. Pain Management: Tylenol or tramadol prn.  4. Insomnia: Add melatonin 3mg  HS   -antipsychotic agents: N/A 5. Neuropsych: This patient is not fully capable of making decisions on her own behalf. 6. Skin/Wound Care: Routine pressure relief measures.  7. Fluids/Electrolytes/Nutrition: Monitor I/O. Check CMET in am.  --offer supplements between meals due to low calorie malnutrition (TP-5.6/alb-2.1)  8. A fib: Monitor HR TID--continue amiodarone and metoprolol.   --Off Eliquis till follow up with neurology.  9. HTN: Monitor BP TID--SBP goal, 160. On metoprolol 25 mg bid with amiodarone 200 mg/day. Continue amlodipine 2.5mg .  10. Hypoxia: Continue 2 L per [place] and wean oxygen as able.   --encourage IS/Flutter valve.  11. E coli UTI: Completed 5 day course of antibiotic on 01/22.  12. GAD/depression: Manage with Lexapro and Klonopin BID (took QID at home?) per  Dr. Elder Love.  --sleep wake chart.  13. ABLA: Recheck CBC in am.   14. Overweight: BMI 28.04- provide dietary education. 15. Anxiety: continue klonopin 0.5mg  BID- daughter says this is very helpful to her.   I have personally performed a face to face diagnostic evaluation, including, but not limited to relevant history and physical exam findings, of this patient and developed relevant assessment and plan.  Additionally, I have reviewed and concur with the physician assistant's documentation above.  Bary Leriche, PA-C   Izora Ribas, MD 07/15/2021

## 2021-07-15 NOTE — Progress Notes (Signed)
Inpatient Rehabilitation Admission Medication Review by a Pharmacist  A complete drug regimen review was completed for this patient to identify any potential clinically significant medication issues.  High Risk Drug Classes Is patient taking? Indication by Medication  Antipsychotic Yes Compazine for N/V  Anticoagulant Yes Lovenox for VTE ppx  Antibiotic No   Opioid Yes Tramadol for pain  Antiplatelet No   Hypoglycemics/insulin No   Vasoactive Medication Yes Amiodarone, metoprolol for Afib, amlodipine for BP  Chemotherapy No   Other Yes Lexapro, Klonopin for anxiety     Type of Medication Issue Identified Description of Issue Recommendation(s)  Drug Interaction(s) (clinically significant)     Duplicate Therapy     Allergy     No Medication Administration End Date     Incorrect Dose     Additional Drug Therapy Needed     Significant med changes from prior encounter (inform family/care partners about these prior to discharge).    Other       Clinically significant medication issues were identified that warrant physician communication and completion of prescribed/recommended actions by midnight of the next day:  No  Pharmacist comments: None  Time spent performing this drug regimen review (minutes):  20 minutes   Tad Moore 07/15/2021 4:09 PM

## 2021-07-15 NOTE — Progress Notes (Addendum)
Physical Therapy Treatment Patient Details Name: Michelle Bauer MRN: [id number] DOB: Oct 27, 1940 Today's Date: 07/15/2021   History of Present Illness 81 y/o female presented to [facility] ED on 07/06/21 following fall hitting head at ALF and after EMS arrived, patient with slurred speech and L sided weakness with subsequent fall out of wheelchair with EMS present. 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 PMH: Afib, HTN, anxiety, scoliosis    PT Comments    Daughter was present during the entire session. Her daughter reported that pt was feeling tired from not sleeping very well the previous night and noted increased back pain. Today's session pt was transferred to the [facility] and back. While in bed patient was able to complete rolling tasks with the use of bed rails and max assistance. On the commode she performed reaching tasks, shoulder rolls, and leftward turning activities.  During treatment session patient showed deficits in strength, endurance, and activity tolerance. Recommending therapy services at acute inpatient rehab to address the previously stated deficits. Pending medical status she may move to inpatient rehab later today. Will continue to follow acutely to maximize functional mobility, independence and safety.    Recommendations for follow up therapy are one component of a multi-disciplinary discharge planning process, led by the attending physician.  Recommendations may be updated based on patient status, additional functional criteria and insurance authorization.  Follow Up Recommendations  Acute inpatient rehab (3hours/day)     Assistance Recommended at Discharge Frequent or constant Supervision/Assistance  Patient can return home with the following Two people to help with walking and/or transfers;Two people to help with bathing/dressing/bathroom;Assistance with cooking/housework;Assistance with feeding;Direct supervision/assist for financial management;Direct supervision/assist for  medications management;Assist for transportation   Equipment Recommendations  Other (comment) (TBD)    Recommendations for Other Services       Precautions / Restrictions Precautions Precautions: Fall Precaution Comments: SBP <160, hx of severe scoliosis, L inattention, L hemiplegia Restrictions Weight Bearing Restrictions: No Other Position/Activity Restrictions: scoliosis     Mobility  Bed Mobility Overal bed mobility: Needs Assistance Bed Mobility: Rolling, Sit to Supine Rolling: Max assist   Supine to sit: Max assist Sit to supine: Total assist        Transfers Overall transfer level: Needs assistance   Transfers: Sit to/from Stand, Bed to chair/wheelchair/BSC Sit to Stand: +2 physical assistance, Max assist   Step pivot transfers: Max assist, +2 physical assistance       General transfer comment: Patient can bear weight and move her right leg a little and needs help managing her left leg during stand pivot transfer.    Ambulation/Gait                   Stairs             Wheelchair Mobility    Modified Rankin (Stroke Patients Only)       Balance Overall balance assessment: Needs assistance Sitting-balance support: Single extremity supported, Feet supported Sitting balance-Leahy Scale: Poor Sitting balance - Comments: min assist for sitting balance. Postural control: Left lateral lean Standing balance support: Bilateral upper extremity supported Standing balance-Leahy Scale: Zero Standing balance comment: maxA+2 to maintain standing                            Cognition Arousal/Alertness: Awake/alert Behavior During Therapy: Flat affect Overall Cognitive Status: History of cognitive impairments - at baseline  General Comments: delayed response time        Exercises      General Comments        Pertinent Vitals/Pain Pain Assessment Pain Assessment: Faces Faces  Pain Scale: Hurts little more Pain Location: Pain in the middle back Pain Intervention(s): Monitored during session    Home Living                          Prior Function            PT Goals (current goals can now be found in the care plan section)      Frequency    Min 4X/week      PT Plan Current plan remains appropriate    Co-evaluation              AM-PAC PT "6 Clicks" Mobility   Outcome Measure  Help needed turning from your back to your side while in a flat bed without using bedrails?: A Lot Help needed moving from lying on your back to sitting on the side of a flat bed without using bedrails?: A Lot Help needed moving to and from a bed to a chair (including a wheelchair)?: A Lot Help needed standing up from a chair using your arms (e.g., wheelchair or bedside chair)?: A Lot Help needed to walk in hospital room?: Total Help needed climbing 3-5 steps with a railing? : Total 6 Click Score: 10    End of Session Equipment Utilized During Treatment: Gait belt Activity Tolerance: Patient tolerated treatment well Patient left: with call bell/phone within reach;in bed;with bed alarm set Nurse Communication: Mobility status PT Visit Diagnosis: Unsteadiness on feet (R26.81);Muscle weakness (generalized) (M62.81);Other abnormalities of gait and mobility (R26.89);History of falling (Z91.81);Other symptoms and signs involving the nervous system (R29.898)     Time: 3532-9924 PT Time Calculation (min) (ACUTE ONLY): 42 min  Charges:  $Therapeutic Activity: 38-52 mins                     Armanda Heritage, SPT    Armanda Heritage 07/15/2021, 1:33 PM

## 2021-07-15 NOTE — Progress Notes (Signed)
Alert and oriented; patient oriented to unit and assigned room. Daughter Carollee Herter at bedside. Patient denies any discomfort or pain. Assessments complete.  Tilden Dome, LPN

## 2021-07-15 NOTE — Progress Notes (Signed)
Patient ID: Michelle Bauer, female   DOB: September 03, 1940, 81 y.o.   MRN: [id number] Brief intro with daughter and patient to review rehab process, team conference and plan of care. Reviewed toileting protocol, and secondary risk management HLD, dietary modification recommendations for [facility] diet. Patient lives in ALF and special diets are made for the patient and medications administered by staff. Dtr plans to hire additional caregivers to support family with care. Continue to follow along to discharge to address educational needs and facilitate preparation for discharge back to ALF. Pamelia Hoit

## 2021-07-15 NOTE — Evaluation (Signed)
Occupational Therapy Assessment and Plan  Patient Details  Name: Michelle Bauer MRN: [id number] Date of Birth: 08/08/1940  OT Diagnosis: abnormal posture, cognitive deficits, hemiplegia affecting non-dominant side, and muscle weakness (generalized) Rehab Potential: Rehab Potential (ACUTE ONLY): Fair ELOS: 3-4 weeks   Today's Date: 07/16/2021 OT Individual Time: 0927-1030 OT Individual Time Calculation (min): 63 min    12 minutes missed due to nursing care  Hospital Problem: Principal Problem:   ICH (intracerebral hemorrhage) ([place])   Past Medical History:  Past Medical History:  Diagnosis Date   Anxiety    Depression    Essential hypertension    Hyperlipidemia    PAF (paroxysmal atrial fibrillation) ([place])    a. 02/2020 Post-op Afib after R Femur FX and IM nail (also hypokalemic)-->Amio/Eliquis (CHA2DS2VASc = 4).   Past Surgical History:  Past Surgical History:  Procedure Laterality Date   ABDOMINAL HYSTERECTOMY     BYPASS AXILLA/BRACHIAL ARTERY     CHOLECYSTECTOMY     COLONOSCOPY WITH PROPOFOL N/A 03/07/2018   Procedure: COLONOSCOPY WITH PROPOFOL;  Surgeon: Lucilla Lame, MD;  Location: [facility] ENDOSCOPY;  Service: Endoscopy;  Laterality: N/A;   INTRAMEDULLARY (IM) NAIL INTERTROCHANTERIC Right 03/10/2020   Procedure: INTRAMEDULLARY (IM) NAIL INTERTROCHANTRIC;  Surgeon: Thornton Park, MD;  Location: [facility] ORS;  Service: Orthopedics;  Laterality: Right;    Assessment & Plan Clinical Impression: Michelle Bauer is a 81 year old female with history of HTN, PAF (eliquis), anxiety/depression, and scoliosis who was admitted via [facility] on 07/06/21 after fall at her ALF with difficulty speaking and left sided weakness. CT head showed Right thalamic ICH with multiple soft tissue contusions about scalp vertex and 1.6 cm nodular lesion at right temporal scalp. Bleed felt to be hypertensive in setting of eliquis and she was started on Cleveprex as SPB > 220 and eliquis reversed with Andexxa.  She  was also found to have E coli UTI  and started on macrodantin, switched to ceftriaxone, then switched to keflex for treatment. Follow up CTA Head/neck showed no change in thalamic hemorrhage with mild surrounding edema, no LVO or AVM.  2D echo showed EF 61-95% with G1 diastolic dysfunction. She developed SOB with hypoxia on 01/20 and CXR revealed chronic hypoventilation in left lower lobe with question of increase in left pleural effusion. CTA chest done and was negative for PE, cosolidative opacities in left lower lobe likely atelectasis without pleural effusion, and incidental finding of enlarged aortic arch-3 cm with recs for yearly CTA or MRA for monitoring. Dr. Leonie Man recommends holding [facility] for now. Confusion resolving with improvement in activity and cleviprex weaned off with recs for SBP< 160.  Klonopin resumed to help manage anxiety/sleep wake disruption. Has been weaned down to 2 L O2 per [place]. She continues to have left hemiplegia with sensory deficits, left body inattention, SLUMS score 12/30 and working on balance at EOB. Family feels cognition is baseline.  CIR recommended due to functional decline.  Patient currently requires total with basic self-care skills secondary to muscle weakness and muscle paralysis, decreased cardiorespiratoy endurance and decreased oxygen support, abnormal tone, unbalanced muscle activation, decreased coordination, and decreased motor planning, decreased midline orientation, decreased attention to left, and decreased motor planning, decreased attention, decreased awareness, decreased problem solving, and decreased safety awareness, and decreased sitting balance, decreased postural control, and decreased balance strategies.  Prior to hospitalization, patient could complete BADLs with min.  Patient will benefit from skilled intervention to increase independence with basic self-care skills prior to discharge  to  ALF .  Anticipate patient will require 24 hour supervision and  minimal physical assistance and follow up home health.  OT - End of Session Endurance Deficit: Yes Endurance Deficit Description: Pt needing rest breaks after rolling, sats in the low 90s when assessed during activity OT Assessment Rehab Potential (ACUTE ONLY): Fair OT Barriers to Discharge: Decreased caregiver support;Incontinence;Lack of/limited family support;Insurance for SNF coverage;Weight;New oxygen OT Patient demonstrates impairments in the following area(s): Balance;Sensory;Skin Integrity;Cognition;Endurance;Motor;Perception;Safety OT Basic ADL's Functional Problem(s): Grooming;Bathing;Dressing;Toileting OT Advanced ADL's Functional Problem(s): Simple Meal Preparation OT Transfers Functional Problem(s): Toilet;Tub/Shower OT Additional Impairment(s): Fuctional Use of Upper Extremity OT Plan OT Intensity: Minimum of 1-2 x/day, 45 to 90 minutes OT Frequency: 5 out of 7 days OT Duration/Estimated Length of Stay: 3-4 weeks OT Treatment/Interventions: Balance/vestibular training;Disease mangement/prevention;Neuromuscular re-education;Self Care/advanced ADL retraining;Therapeutic Exercise;Wheelchair propulsion/positioning;UE/LE Strength taining/ROM;Cognitive remediation/compensation;DME/adaptive equipment instruction;Pain management;Community reintegration;Functional electrical stimulation;Patient/family education;UE/LE Coordination activities;Therapeutic Activities;Visual/perceptual remediation/compensation;Psychosocial support;Functional mobility training;Discharge planning OT Self Feeding Anticipated Outcome(s): No goal OT Basic Self-Care Anticipated Outcome(s): Min A OT Toileting Anticipated Outcome(s): Min A OT Bathroom Transfers Anticipated Outcome(s): Min A OT Recommendation Recommendations for Other Services: Other (comment) (none) Follow Up Recommendations: Home health OT Equipment Recommended: To be determined   OT Evaluation Precautions/Restrictions   Precautions Precautions: Fall Precaution Comments: Dense Lt hemi with impaired/absent sensation on the Lt side, severe scoliosis, 2L 02, Lt shoulder sublux Pain Pain Assessment Pain Scale: 0-10 Pain Score: 0-No pain Home Living/Prior Functioning Home Living Family/patient expects to be discharged to:: Assisted living Available Help at Discharge: Personal care attendant, Family, Available 24 hours/day (family trying to piece together 24/7 care at pts ALF if possible) Type of Home: Assisted living Additional Comments: ALF access/layout information not in chart  Lives With: Other (Comment) (assisted living) IADL History Homemaking Responsibilities: No (per pt, ALF assisted with meals and cleaning) 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 Leisure and Hobbies: flower gardening Prior Function Level of Independence: Needs assistance with ADLs, Requires assistive device for independence (Pt reported having Min A from staff for her ADLs, used a RW for balance support during functional transfers) Vision Baseline Vision/History: 1 Wears glasses Ability to See in Adequate Light: 0 Adequate Patient Visual Report: Blurring of vision Vision Assessment?: Vision impaired- to be further tested in functional context Perception  Perception: Impaired Inattention/Neglect: Does not attend to left visual field;Does not attend to left side of body Praxis Praxis: Intact Cognition Arousal/Alertness: Awake/alert Orientation Level: Person;Place;Situation Year: Other (Comment) ("2030") Month: January ("January-early February") Day of Week: Incorrect Immediate Memory Recall: Sock;Blue Memory Recall Sock: Without Cue Memory Recall Blue: With Cue Memory Recall Bed: Not able to recall Sensation Sensation Light Touch: Impaired by gross assessment Hot/Cold: Impaired Detail Hot/Cold Impaired Details: Absent LUE;Absent LLE Coordination Gross Motor Movements are Fluid and  Coordinated: No Fine Motor Movements are Fluid and Coordinated: No Coordination and Movement Description: Affected by dense Lt hemiplegia with profound sensory deficits Finger Nose Finger Test: unable to complete on the Lt Motor  Motor Motor: Hemiplegia;Abnormal postural alignment and control;Abnormal tone  Trunk/Postural Assessment  Cervical Assessment Cervical Assessment: Exceptions to [facility] (forward head, Lt lateral rotation) Thoracic Assessment Thoracic Assessment: Exceptions to [facility] (scoliosis) Lumbar Assessment Lumbar Assessment: Exceptions to [facility] (posterior tilting when briefly EOB) Postural Control Postural Control: Deficits on evaluation (?Rt pusher, posterior lean/LOB and Lt lateral LOBs EOB, unsafe for pt to sit up with just 1 assist EOB)  Balance Balance Balance Assessed: Yes Dynamic Sitting Balance Sitting balance - Comments:  Static sitting: Total A of 1 Extremity/Trunk Assessment RUE Assessment RUE Assessment: Within Functional Limits LUE Assessment LUE Assessment: Exceptions to [facility] (+subluxation) LUE Body System: Neuro Brunstrum levels for arm and hand: Arm;Hand Brunstrum level for arm: Stage I Presynergy Brunstrum level for hand: Stage II Synergy is developing LUE Tone LUE Tone: Hypotonic  Care Tool Care Tool Self Care Eating      Not assessed  Oral Care     Not assessed    Bathing   Body parts bathed by patient: Chest;Abdomen Body parts bathed by helper: Right arm;Left arm;Front perineal area;Buttocks;Right upper leg;Left upper leg;Right lower leg;Left lower leg;Face   Assist Level: Maximal Assistance - Patient 24 - 49%    Upper Body Dressing(including orthotics)   What is the patient wearing?: Pull over shirt   Assist Level: Maximal Assistance - Patient 25 - 49%    Lower Body Dressing (excluding footwear)   What is the patient wearing?: Incontinence brief;Pants Assist for lower body dressing: Dependent - Patient 0%    Putting on/Taking off  footwear   What is the patient wearing?: Non-skid slipper socks Assist for footwear: Dependent - Patient 0%       Care Tool Toileting Toileting activity   Assist for toileting: Dependent - Patient 0%         Toilet transfer Toilet transfer activity did not occur: N/A (not assessed due to safety, not having +2 assist during eval)         Refer to Care Plan for Long Term Goals  SHORT TERM GOAL WEEK 1 OT Short Term Goal 1 (Week 1): Pt will complete UB ADL EOB with +2 assist to work on sitting balance OT Short Term Goal 2 (Week 1): Pt will complete 1 grooming task with Mod A using compensatory strategies as needed OT Short Term Goal 3 (Week 1): Pt will complete toilet transfer with 2 assist and LRAD to promote OOB toileting  Recommendations for other services: None    Skilled Therapeutic Intervention Skilled OT session completed with focus on initial evaluation, education on OT role/POC, and establishment of patient-centered goals.   Pt greeted in bed, time missed initially due to nursing care/bed change due to pt incontinence, pt needing 2 assist from nursing. Pt then urgently reported needing to void bladder. OT retrieved a bedpan and assisted with toileting. Pt rolling towards affected Lt side with Total A. Pt placed in chair position, B+B void. Total A for hygiene, pt rolling towards the Rt side with Mod A, Lt side with Total A. 02 sats on 2L afterwards 91%. Supine<sit completed with Total A, pt requiring Total A for balance due to trunk control deficits + severe scoliosis. She returned to bed with Total A due to OT not having +2 assist. Pt assisted OT with boosting up with bed placed in trendelenburg position. ADLs performed bedlevel with bed placed in chair position. Note that pt exhibited severe Rt lean, Lt side hypotonic with absent sensation in Lt UE/LE and also face. She has some movement in shoulder with gravity elimiated, hand, and thumb. Also noted subluxation. Due to  scoliosis, pt unable to lie flat in bed, leans towards the Lt and puts more pressure on her Lt shoulder. Daughter arrived at this time and was agreeable to help OT assist pt into sidelying position as we could not use the chuck pad to get her into a safer position to promote joint protection. PT arrived for eval at this time, direct handoff to PT.  ADL ADL Eating: Not assessed Grooming: Maximal assistance Where Assessed-Grooming: Bed level Upper Body Bathing: Moderate assistance Where Assessed-Upper Body Bathing: Bed level Lower Body Bathing: Maximal assistance Where Assessed-Lower Body Bathing: Bed level Upper Body Dressing: Maximal assistance Where Assessed-Upper Body Dressing: Bed level Lower Body Dressing: Dependent Where Assessed-Lower Body Dressing: Bed level Toileting: Dependent Where Assessed-Toileting: Bed level Toilet Transfer: Not assessed Tub/Shower Transfer: Not assessed ADL Comments: transfers not assessed due to OT not having +2 assist during eval Mobility  Bed Mobility Bed Mobility: Rolling Right;Rolling Left Rolling Right: Total Assistance - Patient < 25% Rolling Left: Moderate Assistance - Patient 50-74%   Discharge Criteria: Patient will be discharged from OT if patient refuses treatment 3 consecutive times without medical reason, if treatment goals not met, if there is a change in medical status, if patient makes no progress towards goals or if patient is discharged from hospital.  The above assessment, treatment plan, treatment alternatives and goals were discussed and mutually agreed upon: by patient and by family  Skeet Simmer 07/16/2021, 1:08 PM

## 2021-07-15 NOTE — Consult Note (Signed)
Consult Note  Michelle GlossDorothy S Bauer 06-18-1941  [id number].    Requesting MD: Dr. Pola Cornahal Chief Complaint/Reason for Consult: abdominal distension, possible GOO  HPI:  81 yo female with medical history significant for atrial fibrillation previously on eliquis, HTN, hyperlipidemia, CAD who was initially admitted on 1/18 for new hemorrhagic stroke. She has been seen by neurology during admission. During admission eliquis discontinued and has been treated for UTI. Now on hospitalist service and planning discharge to CIR. She has required supplemental oxygen via [place] but no acute findings of CT chest.  Over the last two days she has had worsening abdominal distension and xray yesterday evening showed "The stomach is dilated and air-filled. Please correlate clinically. Findings may related to gastric outlet obstruction, gastroparesis or other etiology". She is NPO currently but had been tolerating diet through yesterday. She denies nausea/emesis. No labs since 1/23. She denies abdominal pain with palpation but does have some mild periumbilical pain at rest. Per daughter abdomen may be somewhat distended but if so not enough to be substantial.  She has history colonoscopy 2019 for rectal bleeding, hematochezia with findings of diverticulosis.  She is very drowsy this am as she did not sleep well last night and history obtained from chart, patient, and patient's daughter who is bedside  Allergies: PCNs, sulfa abx - unknown reaction Blood thinners: none Past Surgeries: abominal hysterectomy, cholecystectomy  ROS: Review of Systems  Constitutional:  Negative for chills and fever.  Respiratory:  Negative for cough, shortness of breath and wheezing.   Cardiovascular:  Negative for chest pain and leg swelling.  Gastrointestinal:  Positive for abdominal pain (mild). Negative for constipation, diarrhea, nausea and vomiting.  Genitourinary: Negative.    Family History  Problem Relation Age of Onset    Stroke Mother    Heart attack Father    Alcohol abuse Father    Stroke Sister    Diabetes Brother    Colon cancer Sister     Past Medical History:  Diagnosis Date   Anxiety    Depression    Essential hypertension    Hyperlipidemia    PAF (paroxysmal atrial fibrillation) ([facility])    a. 02/2020 Post-op Afib after R Femur FX and IM nail (also hypokalemic)-->Amio/Eliquis (CHA2DS2VASc = 4).    Past Surgical History:  Procedure Laterality Date   ABDOMINAL HYSTERECTOMY     BYPASS AXILLA/BRACHIAL ARTERY     CHOLECYSTECTOMY     COLONOSCOPY WITH PROPOFOL N/A 03/07/2018   Procedure: COLONOSCOPY WITH PROPOFOL;  Surgeon: Midge MiniumWohl, Darren, MD;  Location: [facility] ENDOSCOPY;  Service: Endoscopy;  Laterality: N/A;   INTRAMEDULLARY (IM) NAIL INTERTROCHANTERIC Right 03/10/2020   Procedure: INTRAMEDULLARY (IM) NAIL INTERTROCHANTRIC;  Surgeon: Juanell FairlyKrasinski, Kevin, MD;  Location: [facility] ORS;  Service: Orthopedics;  Laterality: Right;    Social History:  reports that she has never smoked. She has never used smokeless tobacco. She reports that she does not drink alcohol and does not use drugs.  Allergies:  Allergies  Allergen Reactions   Penicillins Other (See Comments)    Has patient had a PCN reaction causing immediate rash, facial/tongue/throat swelling, SOB or lightheadedness with hypotension: Unknown Has patient had a PCN reaction causing severe rash involving mucus membranes or skin necrosis: Unknown Has patient had a PCN reaction that required hospitalization: Unknown Has patient had a PCN reaction occurring within the last 10 years: No If all of the above answers are "NO", then may proceed with Cephalosporin use.   Sulfa Antibiotics Other (See Comments)  Unknown reaction    Medications Prior to Admission  Medication Sig Dispense Refill   amiodarone (PACERONE) 200 MG tablet Take 1 tablet (200 mg total) by mouth daily. (Patient taking differently: Take 200 mg by mouth every morning.) 90 tablet 1    apixaban (ELIQUIS) 5 MG TABS tablet Take 1 tablet (5 mg total) by mouth 2 (two) times daily. 180 tablet 1   cholecalciferol (VITAMIN D3) 25 MCG (1000 UNIT) tablet Take 2,000 Units by mouth every morning.     clonazePAM (KLONOPIN) 0.5 MG tablet Take 1 tablet (0.5 mg total) by mouth 4 (four) times daily. TAKE 1 TABLET(0.5 MG) BY MOUTH FOUR TIMES DAILY (Patient taking differently: Take 0.5 mg by mouth 4 (four) times daily.) 360 tablet 1   escitalopram (LEXAPRO) 5 MG tablet TAKE 1 TABLET(5 MG) BY MOUTH DAILY (Patient taking differently: Take 5 mg by mouth every morning.) 90 tablet 1   metoprolol tartrate (LOPRESSOR) 25 MG tablet Take 0.5 tablets (12.5 mg total) by mouth 2 (two) times daily. 90 tablet 1   Multiple Vitamin (MULTIVITAMIN WITH MINERALS) TABS tablet Take 1 tablet by mouth every morning. Women's 50 +     Omega-3 Fatty Acids (FISH OIL PO) Take 1 capsule by mouth daily with lunch.     Potassium Gluconate 550 MG TABS Take 1,100 mg by mouth every morning.     traMADol (ULTRAM) 50 MG tablet Take 2 tablets (100 mg total) by mouth in the morning, at noon, and at bedtime. (Patient taking differently: Take 100 mg by mouth in the morning, at noon, and at bedtime. scheduled) 180 tablet 5   vitamin B-12 (CYANOCOBALAMIN) 500 MCG tablet Take 1,000 mcg by mouth every morning.     [DISCONTINUED] acetaminophen (TYLENOL) 500 MG tablet Take 1,000 mg by mouth 3 (three) times daily. Scheduled 8am 12pm 5pm      Blood pressure (!) 160/82, pulse 72, temperature 98.3 F (36.8 C), temperature source Oral, resp. rate 20, SpO2 96 %. Physical Exam: General: pleasant, WD, female who is laying in bed in NAD, drowsy HEENT: head is normocephalic, atraumatic.  Sclera are noninjected.  Pupils equal and round. EOMs intact.  Ears and nose without any masses or lesions.  Mouth is pink and moist Heart: regular, rate, and rhythm. Palpable radial pulses bilaterally Lungs: Respiratory effort nonlabored on supplemental O2 via nasal  cannula  Abd: soft, +BS, no masses, hernias, or organomegaly. Well healed abdominal surgical scars. Mildly distended. No  MSK: all 4 extremities are symmetrical with no cyanosis, clubbing, or edema. Skin: warm and dry with no masses, lesions, or rashes Neuro: left sided weakness  Psych: A&Ox3 with an appropriate affect.    No results found for this or any previous visit (from the past 48 hour(s)). DG Abd Portable 1V  Result Date: 07/14/2021 CLINICAL DATA:  Abdominal pain. EXAM: PORTABLE ABDOMEN - 1 VIEW COMPARISON:  None. FINDINGS: The stomach is dilated and air-filled. No other dilated bowel loops are seen. Air seen to the level of the rectum. There is a large amount of stool throughout the colon. No suspicious calcifications. There is scoliosis of the thoracolumbar spine. Sternotomy wires, cholecystectomy clips and right hip screw are present. IMPRESSION: 1. The stomach is dilated and air-filled. Please correlate clinically. Findings may related to gastric outlet obstruction, gastroparesis or other etiology. Electronically Signed   By: Darliss Cheney M.D.   On: 07/14/2021 19:12      Assessment/Plan Abdominal distension - abdominal xray 1/26: The stomach is dilated and  air-filled. Please correlate clinically. Findings may related to gastric outlet obstruction, gastroparesis or other etiology - abdominal exam benign except for mild distension. No nausea or emesis and has been tolerating diet. Having bowel movements. No acute surgical intervention recommended. Patient could benefit from contrast study especially if distension does not improve or if she develops further symptoms. Pending study findings could benefit from GI consult  We will sign off but please do not hesitate to contact us with any questions or concerns or reconsult if needed.  I reviewed hospitalist notes, last 24 h vitals and pain scores, last 48 h intake and output, and last 24 h imaging results.  This care required moderate  level of medical decision making.   Eric Form, [facility] 07/15/2021, 11:40 AM Please see Amion for pager number during day hours 7:00am-4:30pm

## 2021-07-15 NOTE — PMR Pre-admission (Signed)
PMR Admission Coordinator Pre-Admission Assessment  Patient: Michelle Bauer is an 81 y.o., female MRN: [id number] DOB: August 21, 1940 Height:   Weight:    Insurance Information HMO:     PPO:      PCP:      IPA:      80/20:      OTHER:  PRIMARY: Medicare A/B      Policy#: [id number]      Subscriber:  CM Name:       Phone#:      Fax#:  Pre-Cert#: verified Civil engineer, contracting:  Benefits:  Phone #:      Name:  Eff. Date: 03/20/07 A and B     Deduct: $1600      Out of Pocket Max: n/a      Life Max: n/a CIR: 100%      SNF: 20 full days Outpatient: 80%     Co-Ins: 20% Home Health: 100%      Co-Pay:  DME: 80%     Co-Ins: 20% Providers:  [employer] [place]      Policy#: [id number]    Phone#: [phone]  Financial Counselor:       Phone#:   The Data Collection Information Summary for patients in Inpatient Rehabilitation Facilities with attached Privacy Act [place] Records was provided and verbally reviewed with: Patient and Family  Emergency Contact Information Contact Information     Name Relation Home Work Mobile   [place] Daughter [phone]     S[place] Frederic Daughter   [phone]       Current Medical History  Patient Admitting Diagnosis: R thalamic ICH  History of Present Illness: Michelle Bauer is a 81 year old female with history of HTN, PAF (eliquis), anxiety/depression, and scoliosis who was admitted via [facility] on 07/06/21 after fall at her ALF with difficulty speaking and left sided weakness. CT head showed Right thalamic ICH with multiple soft tissue contusions about scalp vertex and 1.6 cm nodular lesion at right temporal scalp. Bleed felt to be hypertensive in setting of eliquis and she was started on Cleveprex as SPB > 220 and eliquis reversed with Andexxa.  She was also found to have E coli UTI  and started on macrodantin, switched to ceftriaxone, then switched to keflex for treatment. Follow up CTA Head/neck showed no change in thalamic  hemorrhage with mild surrounding edema, no LVO or AVM.  2D echo showed EF 14-97% with G1 diastolic dysfunction. She developed SOB with hypoxia on 01/20 and CXR revealed chronic hypoventilation in left lower lobe with question of increase in left pleural effusion. CTA chest done and was negative for PE, cosolidative opacities in left lower lobe likely atelectasis without pleural effusion, and incidental finding of enlarged aortic arch-3 cm with recs for yearly CTA or MRA for monitoring. Dr. Leonie Man recommends holding [facility] for now. Confusion resolving with improvement in activity and cleviprex weaned off with recs for SBP< 160.  Klonopin resumed to help manage anxiety/sleep wake disruption. Has been weaned down to 2 L O2 per [place]. She continues to have left hemiplegia with sensory deficits, left body inattention, SLUMS score 12/30 and working on balance at EOB. Family feels cognition is baseline.  CIR recommended due to functional decline.     Complete NIHSS TOTAL: 11  [facility]'s [facility] record from [facility] has been reviewed by the rehabilitation admission coordinator and physician.  Past Medical History  Past Medical History:  Diagnosis Date   Anxiety  Depression    Essential hypertension    Hyperlipidemia    PAF (paroxysmal atrial fibrillation) ([place])    a. 02/2020 Post-op Afib after R Femur FX and IM nail (also hypokalemic)-->Amio/Eliquis (CHA2DS2VASc = 4).    Has the patient had major surgery during 100 days prior to admission? No  Family History   family history includes Alcohol abuse in her father; Colon cancer in her sister; Diabetes in her brother; Heart attack in her father; Stroke in her mother and sister.  Current Medications  Current Facility-Administered Medications:    0.9 %  sodium chloride infusion, , Intravenous, Continuous, Dahal, Binaya, MD, Last Rate: 75 mL/hr at 07/14/21 2146, New Bag at 07/14/21 2146   acetaminophen (TYLENOL) tablet 650 mg, 650 mg, Oral, Q4H PRN, 650 mg at  07/12/21 2224 **OR** acetaminophen (TYLENOL) 160 MG/5ML solution 650 mg, 650 mg, Per Tube, Q4H PRN **OR** acetaminophen (TYLENOL) suppository 650 mg, 650 mg, Rectal, Q4H PRN, Greta Doom, MD   amiodarone (PACERONE) tablet 200 mg, 200 mg, Oral, Daily, Charlean Merl, [place], NP, 200 mg at 07/14/21 1044   amLODipine (NORVASC) tablet 2.5 mg, 2.5 mg, Oral, Daily, Dahal, Binaya, MD, 2.5 mg at 07/14/21 1046   bacitracin ointment, , Topical, PRN, Rozann Lesches, RPH   bisacodyl (DULCOLAX) suppository 10 mg, 10 mg, Rectal, Daily, Dahal, Marlowe Aschoff, MD   clonazePAM (KLONOPIN) tablet 0.5 mg, 0.5 mg, Oral, BID, Charlean Merl, [place], NP, 0.5 mg at 07/14/21 2140   clonazePAM (KLONOPIN) tablet 0.5 mg, 0.5 mg, Oral, BID PRN, Janine Ores, NP, 0.5 mg at 07/11/21 0051   enoxaparin (LOVENOX) injection 40 mg, 40 mg, Subcutaneous, Daily, Leonie Man, Pramod S, MD, 40 mg at 07/14/21 1047   escitalopram (LEXAPRO) tablet 5 mg, 5 mg, Oral, q morning, Bonnielee Haff, MD, 5 mg at 07/14/21 1045   hydrALAZINE (APRESOLINE) injection 20 mg, 20 mg, Intravenous, Q6H PRN, Janine Ores, NP, 20 mg at 07/14/21 2323   labetalol (NORMODYNE) injection 10 mg, 10 mg, Intravenous, Q10 min PRN, Charlean Merl, Marcelino Scot, NP, 10 mg at 07/15/21 0354   MEDLINE mouth rinse, 15 mL, Mouth Rinse, BID, Greta Doom, MD, 15 mL at 07/14/21 2226   metoprolol tartrate (LOPRESSOR) tablet 25 mg, 25 mg, Oral, BID, Bonnielee Haff, MD, 25 mg at 07/14/21 2140   ondansetron (ZOFRAN) injection 4 mg, 4 mg, Intravenous, Q6H PRN, Dahal, Binaya, MD   pantoprazole (PROTONIX) EC tablet 40 mg, 40 mg, Oral, QHS, Garvin Fila, MD, 40 mg at 07/14/21 2140   polyethylene glycol (MIRALAX / GLYCOLAX) packet 17 g, 17 g, Oral, Daily, Bonnielee Haff, MD   senna-docusate (Senokot-S) tablet 1 tablet, 1 tablet, Oral, Daily PRN, Bonnielee Haff, MD, 1 tablet at 07/11/21 0420   traMADol (ULTRAM) tablet 50 mg, 50 mg, Oral, Q6H PRN, Janine Ores, NP, 50 mg at 07/11/21 0051  Patients Current  Diet:  Diet Order             Diet NPO time specified  Diet effective now           Diet - low sodium heart healthy                   Precautions / Restrictions Precautions Precautions: Fall Precaution Comments: SBP <160, hx of severe scoliosis, L inattention, L hemiplegia Restrictions Weight Bearing Restrictions: No Other Position/Activity Restrictions: scoliosis   Has the patient had 2 or more falls or a fall with injury in the past year? Yes  Prior Activity Level Household: mod I with RW  in ALF, could walk to dining room most days, mod I with ADLs  Prior Functional Level Self Care: Did the patient need help bathing, dressing, using the toilet or eating? Independent  Indoor Mobility: Did the patient need assistance with walking from room to room (with or without device)? Independent  Stairs: Did the patient need assistance with internal or external stairs (with or without device)? Needed some help  Functional Cognition: Did the patient need help planning regular tasks such as shopping or remembering to take medications? Dependent  Patient Information Are you of Hispanic, Latino/a,or Spanish origin?: A. No, not of Hispanic, Latino/a, or Spanish origin What is your race?: A. White Do you need or want an interpreter to communicate with a doctor or health care staff?: 0. No  Patient's Response To:  Health Literacy and Transportation Is the patient able to respond to health literacy and transportation needs?: No Health Literacy - How often do you need to have someone help you when you read instructions, pamphlets, or other written material from your doctor or pharmacy?: Patient unable to respond In the past 12 months, has lack of transportation kept you from medical appointments or from getting medications?: No (daughter responds) In the past 12 months, has lack of transportation kept you from meetings, work, or from getting things needed for daily living?: No (daughter  responds)  Development worker, international aid / Equipment Home Equipment: Conservation officer, nature (2 wheels), Hand held shower head, Grab bars - tub/shower, Grab bars - toilet, Shower seat  Prior Device Use: Indicate devices/aids used by the patient prior to current illness, exacerbation or injury? Walker  Current Functional Level Cognition  Arousal/Alertness: Awake/alert Overall Cognitive Status: History of cognitive impairments - at baseline Difficult to assess due to: Hard of hearing/deaf Orientation Level: Oriented to person, Oriented to place General Comments: delayed response time, delayed processing however improving compared to initial eval, pt oriented to date Attention: Focused, Sustained Focused Attention: Appears intact Sustained Attention: Appears intact Memory: Impaired Memory Impairment: Decreased recall of new information, Decreased short term memory, Retrieval deficit (Immediate: 5/5 with multiple repetitions; delayed: 1/5; 4/4 with cues) Awareness: Appears intact Problem Solving: Appears intact Executive Function: Sequencing, Technical brewer: Impaired Sequencing Impairment: Verbal complex (clock drawing: 2/4; pt stated she did not know what to do after inserting numbers) Organizing: Impaired Organizing Impairment: Verbal complex (backward digit span: 0/2)    Extremity Assessment (includes Sensation/Coordination)  Upper Extremity Assessment: LUE deficits/detail LUE Deficits / Details: no sensation, no movement LUE Sensation: decreased light touch LUE Coordination: decreased fine motor, decreased gross motor  Lower Extremity Assessment: Defer to PT evaluation LLE Deficits / Details: Grossly 2+/5 with 3-/5 for knee extension sitting EOB LLE Sensation: decreased light touch, decreased proprioception LLE Coordination: decreased fine motor, decreased gross motor    ADLs  Overall ADL's : Needs assistance/impaired Eating/Feeding: Set up Grooming: Wash/dry hands, Wash/dry face,  Brushing hair, Sitting, Moderate assistance Grooming Details (indicate cue type and reason): unable to use LUE to assist with self care tasks Upper Body Bathing: Moderate assistance, Sitting Lower Body Bathing: Total assistance, Bed level Upper Body Dressing : Moderate assistance, Sitting Upper Body Dressing Details (indicate cue type and reason): verbal cues to attend to left, changed gown Lower Body Dressing: Total assistance, +2 for physical assistance, Sit to/from stand Toilet Transfer: Maximal assistance, +2 for physical assistance, +2 for safety/equipment, Stand-pivot, BSC/3in1 Toilet Transfer Details (indicate cue type and reason): good push up from R side Toileting- Clothing Manipulation and Hygiene: Total assistance, +  2 for physical assistance, +2 for safety/equipment, Sit to/from stand Toileting - Clothing Manipulation Details (indicate cue type and reason): Pt able to maintain standing for significant timeframe for 3rd person to complete peri care Functional mobility during ADLs: Maximal assistance, +2 for physical assistance, +2 for safety/equipment (SPT face to face 2 person) General ADL Comments: grooming and gown change performed seated on EOB with assistance of another for sitting balance    Mobility  Overal bed mobility: Needs Assistance Bed Mobility: Rolling, Sidelying to Sit Rolling: Min assist Sidelying to sit: Max assist, Mod assist, HOB elevated Supine to sit: Max assist General bed mobility comments: directional verbal cues, minA to achieve full log roll, maxA for trunk elevation, pt with improved effort to use R LE in attempt to bring L LE off EOB    Transfers  Overall transfer level: Needs assistance Equipment used: 2 person hand held assist (face to face with left knee blocked) Transfers: Sit to/from Stand, Bed to chair/wheelchair/BSC Sit to Stand: Mod assist, +2 physical assistance (left kneed blocked) Bed to/from chair/wheelchair/BSC transfer type:: Step  pivot Stand pivot transfers: Max assist, +2 physical assistance, +2 safety/equipment Step pivot transfers: Max assist, +2 physical assistance General transfer comment: patient shuffles right foot into position, maxA for weight shifting, maxA for L LE mangement    Ambulation / Gait / Stairs / Wheelchair Mobility  Ambulation/Gait General Gait Details: unable at this time    Posture / Balance Dynamic Sitting Balance Sitting balance - Comments: min assist for sitting balance.  Addressed posture alignment and pushing up with RUE Balance Overall balance assessment: Needs assistance Sitting-balance support: Single extremity supported, Feet supported Sitting balance-Leahy Scale: Poor Sitting balance - Comments: min assist for sitting balance.  Addressed posture alignment and pushing up with RUE Postural control: Left lateral lean Standing balance support: Bilateral upper extremity supported Standing balance-Leahy Scale: Zero Standing balance comment: maxA+2 to maintain standing    Special needs/care consideration Oxygen 2L (weaning) and Skin abrasions to face from falls   Previous Home Environment (from acute therapy documentation) Type of Home: Assisted living  Discharge Living Setting Plans for Discharge Living Setting: Other (Comment) Type of Home at Discharge: [place] Name at Discharge: [place] Discharge Bathroom Accessibility: Yes How Accessible: Accessible via walker  Social/Family/Support Systems Anticipated Caregiver: caregivers at ALF and daughters [name] and [place] Anticipated Caregiver's Contact Information: [name] [phone]; [name] [phone] Ability/Limitations of Caregiver: both work, can check on pt at ALF daily and can plan to hire caregivers to assist at ALF if that is allowed; otherwise will be decrease BOC for ST SNF Caregiver Availability: 24/7 Discharge Plan Discussed with Primary Caregiver: Yes Is Caregiver In Agreement with Plan?:  Yes Does Caregiver/Family have Issues with Lodging/Transportation while Pt is in Rehab?: No  Goals Patient/Family Goal for Rehab: PT/OT min assist w/c level, SLP n/a (pt at cognitive baseline per family) Expected length of stay: 14-16 days Additional Information: facility can provide min assist at w/c level, otherwise will need to have hired caregivers (if allowed) or pursue ST SNF Pt/Family Agrees to Admission and willing to participate: Yes Program Orientation Provided & Reviewed with Pt/Caregiver Including Roles  & Responsibilities: Yes  Decrease burden of Care through IP rehab admission: Decrease number of caregivers  Possible need for SNF placement upon discharge: Potentially.  Pt's ALF can provide up to min assist w/c level for patient.  If she requires more assist and ALF will not allow private caregivers to provide that in their  facility then pt may require a short term SNF placement for extended rehab.    Patient Condition: I have reviewed medical records from [facility], spoken with  toc team , and patient and daughter. I met with patient at the bedside for inpatient rehabilitation assessment.  Patient will benefit from ongoing PT and OT, can actively participate in 3 hours of therapy a day 5 days of the week, and can make measurable gains during the admission.  Patient will also benefit from the coordinated team approach during an Inpatient Acute Rehabilitation admission.  The patient will receive intensive therapy as well as Rehabilitation physician, nursing, social worker, and care management interventions.  Due to safety, skin/wound care, disease management, medication administration, pain management, and patient education the patient requires 24 hour a day rehabilitation nursing.  The patient is currently mod to max +2 with mobility and basic ADLs.  Discharge setting and therapy post discharge at  ALF versus SNF  is anticipated.  Patient has agreed to participate in the Acute Inpatient  Rehabilitation Program and will admit today.  Preadmission Screen Completed By:  Michel Santee, PT, DPT 07/15/2021 10:42 AM ______________________________________________________________________   Discussed status with Dr. Ranell Patrick on 07/15/21  at 10:51 AM  and received approval for admission today.  Admission Coordinator:  Michel Santee, PT, DPT time 10:51 AM Sudie Grumbling 07/15/21    Assessment/Plan: Diagnosis: ICH Does the need for close, 24 hr/day Medical supervision in concert with the patient's rehab needs make it unreasonable for this patient to be served in a less intensive setting? Yes Co-Morbidities requiring supervision/potential complications: GAD, constipation, insomnia, essential HTN, afib Due to bladder management, bowel management, safety, skin/wound care, disease management, medication administration, pain management, and patient education, does the patient require 24 hr/day rehab nursing? Yes Does the patient require coordinated care of a physician, rehab nurse, PT, OT, and SLP to address physical and functional deficits in the context of the above medical diagnosis(es)? Yes Addressing deficits in the following areas: balance, endurance, locomotion, strength, transferring, bowel/bladder control, bathing, dressing, feeding, grooming, toileting, cognition, and psychosocial support Can the patient actively participate in an intensive therapy program of at least 3 hrs of therapy 5 days a week? Yes The potential for patient to make measurable gains while on inpatient rehab is excellent Anticipated functional outcomes upon discharge from inpatient rehab: min assist PT, supervision OT, supervision SLP Estimated rehab length of stay to reach the above functional goals is: 12-16 days Anticipated discharge destination: Home 10. Overall Rehab/Functional Prognosis: good   MD Signature: Leeroy Cha, MD

## 2021-07-16 DIAGNOSIS — I4819 Other persistent atrial fibrillation: Secondary | ICD-10-CM

## 2021-07-16 DIAGNOSIS — D62 Acute posthemorrhagic anemia: Secondary | ICD-10-CM

## 2021-07-16 DIAGNOSIS — I10 Essential (primary) hypertension: Secondary | ICD-10-CM

## 2021-07-16 DIAGNOSIS — I610 Nontraumatic intracerebral hemorrhage in hemisphere, subcortical: Secondary | ICD-10-CM

## 2021-07-16 DIAGNOSIS — S43002S Unspecified subluxation of left shoulder joint, sequela: Secondary | ICD-10-CM

## 2021-07-16 NOTE — Evaluation (Signed)
Physical Therapy Assessment and Plan  Patient Details  Name: Michelle Bauer MRN: [id number] Date of Birth: 07-29-1940  PT Diagnosis: Abnormal posture, Difficulty walking, Hemiparesis non-dominant, Impaired sensation, and Muscle weakness Rehab Potential: Fair ELOS: 21-28 days   Today's Date: 07/16/2021 PT Individual Time: 1036-1200 PT Individual Time Calculation (min): 84 min    Hospital Problem: Principal Problem:   ICH (intracerebral hemorrhage) ([place])   Past Medical History:  Past Medical History:  Diagnosis Date   Anxiety    Depression    Essential hypertension    Hyperlipidemia    PAF (paroxysmal atrial fibrillation) ([place])    a. 02/2020 Post-op Afib after R Femur FX and IM nail (also hypokalemic)-->Amio/Eliquis (CHA2DS2VASc = 4).   Past Surgical History:  Past Surgical History:  Procedure Laterality Date   ABDOMINAL HYSTERECTOMY     BYPASS AXILLA/BRACHIAL ARTERY     CHOLECYSTECTOMY     COLONOSCOPY WITH PROPOFOL N/A 03/07/2018   Procedure: COLONOSCOPY WITH PROPOFOL;  Surgeon: Lucilla Lame, MD;  Location: [facility] ENDOSCOPY;  Service: Endoscopy;  Laterality: N/A;   INTRAMEDULLARY (IM) NAIL INTERTROCHANTERIC Right 03/10/2020   Procedure: INTRAMEDULLARY (IM) NAIL INTERTROCHANTRIC;  Surgeon: Thornton Park, MD;  Location: [facility] ORS;  Service: Orthopedics;  Laterality: Right;    Assessment & Plan Clinical Impression: Patient is a 81 y.o.  female with history of HTN, PAF-on eliquis, anxiety/depression,scoliosis who was admitted via [facility] on 07/06/21 after fall with difficulty speaking and left sided weakness at her ALF. CT head showed Right thalamic ICH with multiple soft tissue contusions about scalp vertex and 1.6 cm nodular lesion at right temporal scalp. Bleed felt to be hypertensive in setting of eliquis and she was started on Cleveprex as SPB > 220 and eliquis reversed with Andexxa.  She was also found to have E coli UTI  and started on macrodantin-->ceftriaxone-->keflex for  treatment. She was transferred to [facility] for management. CTA Head/neck showed no change in thalamic hemorrhage with mild surrounding edema, no LVO or AVM.  2D echo showed EF 60-65% with grade 1 DD.      She developed SOB with hypoxia on 01/20 and CXR done revealing chronic hypoventilation LLL with question of increase in left pleural effusion. CTA chest done and was negative for PE, cosolidative opacities LLL likely atelectasis without pleural effusion and incidental finding of enlarged aortic arch-3 cm with recs for yearly CTA or MRA for monitoring. Dr. Leonie Man recommends holding [facility] till appropriate to resume. Confusion resolving with improvement in activity and cleviprex weaned off with recs for SBP< 160. Marland Kitchen Klonopin resumed to help manage anxiety/sleep wake disruption. Has been weaned down to 2 L per [place].   She developed abdominal distension with diarrhea and KUB showed some concerns of gastric obstruction. Dr. Donne Hazel consulted and no work up indicated as patient without pain or N/V and benign exam. She continues to have left hemiplegia with sensory deficits, left body inattention,  worsening of baseline cognitive deficits with delay in processing and recall- SLUMS score 12/30 and working on balance at EOB. Can be accommodated at ALF if requiring min assist at [facility] level. CIR recommended due to functional decline. Patient transferred to CIR on 07/15/2021 .   Patient currently requires max assist +2 with mobility secondary to muscle weakness, decreased cardiorespiratoy endurance and decreased oxygen support, impaired timing and sequencing, unbalanced muscle activation, and decreased motor planning, decreased visual acuity, decreased visual motor skills, and hemianopsia, decreased midline orientation, decreased attention to left, left side neglect, and decreased motor planning, decreased  awareness, and decreased sitting balance, decreased standing balance, decreased postural control, decreased balance strategies,  and L hemipareisis .  Prior to hospitalization, patient was modified independent  with mobility and lived at  ([facility] in [place]) in a Assisted living home.  Home access is  Level entry.  Patient will benefit from skilled PT intervention to maximize safe functional mobility, minimize fall risk, and decrease caregiver burden for planned discharge home with 24 hour assist.  Anticipate patient will benefit from follow up [facility] at discharge.  PT - End of Session Activity Tolerance: Tolerates 30+ min activity with multiple rests Endurance Deficit: Yes Endurance Deficit Description: Pt requires frequent rest breaks throughout session, O2 sats fluctuate WFL PT Assessment Rehab Potential (ACUTE/IP ONLY): Fair PT Barriers to Discharge: Insurance for SNF coverage;Weight;Nutrition means;New oxygen PT Patient demonstrates impairments in the following area(s): Balance;Endurance;Edema;Motor;Nutrition;Perception;Safety;Sensory;Skin Integrity PT Transfers Functional Problem(s): Bed Mobility;Bed to Chair;Car;Furniture PT Locomotion Functional Problem(s): Ambulation;Wheelchair Mobility PT Plan PT Intensity: Minimum of 1-2 x/day ,45 to 90 minutes PT Frequency: 5 out of 7 days PT Duration Estimated Length of Stay: 21-28 days PT Treatment/Interventions: Ambulation/gait training;Community reintegration;DME/adaptive equipment instruction;Neuromuscular re-education;Psychosocial support;Stair training;UE/LE Strength taining/ROM;Wheelchair propulsion/positioning;UE/LE Coordination activities;Therapeutic Activities;Skin care/wound management;Pain management;Functional electrical stimulation;Discharge planning;Balance/vestibular training;Cognitive remediation/compensation;Disease management/prevention;Functional mobility training;Patient/family education;Splinting/orthotics;Therapeutic Exercise;Visual/perceptual remediation/compensation PT Transfers Anticipated Outcome(s): CGA PT Locomotion Anticipated  Outcome(s): MinA PT Recommendation Follow Up Recommendations: Home health PT Patient destination: Assisted Living   PT Evaluation Precautions/Restrictions Precautions Precautions: Fall Precaution Comments: Dense L hemi with absent sensation, severe scoliosis, 2L 02, possible L shoulder sublux General   Vital Signs Pain Pain Assessment Pain Scale: 0-10 Pain Score: 0-No pain Pain Interference Pain Interference Pain Effect on Sleep: 1. Rarely or not at all Pain Interference with Therapy Activities: 1. Rarely or not at all Pain Interference with Day-to-Day Activities: 1. Rarely or not at all Home Living/Prior [place] Available Help at Discharge: Personal care attendant;Family;Available 24 hours/day Type of Home: Assisted living Home Access: Level entry Home Layout: One level Bathroom Shower/Tub: Multimedia programmer: Handicapped height Additional Comments: ALF access/layout information not in chart  Lives With:  ([facility] in [place]) Prior Function Level of Independence: Needs assistance with ADLs;Requires assistive device for independence Driving: No Vision/Perception  Vision - History Ability to See in Adequate Light: 0 Adequate Perception Perception: Impaired Inattention/Neglect: Does not attend to left visual field;Does not attend to left side of body Praxis Praxis: Intact  Cognition Overall Cognitive Status: History of cognitive impairments - at baseline Arousal/Alertness: Awake/alert Orientation Level: Oriented to person;Oriented to place;Oriented to situation Year: Other (Comment) ("2030") Month: January ("January-early February") Day of Week: Incorrect Focused Attention: Appears intact Sustained Attention: Appears intact Memory: Impaired Immediate Memory Recall: Sock;Blue Memory Recall Sock: Without Cue Memory Recall Blue: With Cue Memory Recall Bed: Not able to recall Awareness: Appears intact Problem  Solving: Appears intact Sequencing: Impaired Organizing: Impaired Safety/Judgment: Impaired Sensation Sensation Light Touch: Impaired by gross assessment Hot/Cold: Impaired Detail Hot/Cold Impaired Details: Absent LUE;Absent LLE Coordination Gross Motor Movements are Fluid and Coordinated: No Fine Motor Movements are Fluid and Coordinated: No Coordination and Movement Description: Affected by dense Lt hemiplegia with profound sensory deficits Finger Nose Finger Test: unable to complete on the Lt Motor  Motor Motor: Hemiplegia;Abnormal postural alignment and control;Abnormal tone   Trunk/Postural Assessment  Cervical Assessment Cervical Assessment: Exceptions to [facility] (forward head, Lt lateral rotation) Thoracic Assessment Thoracic Assessment: Exceptions to [facility] (R thoracic C-curve scoliosis) Lumbar Assessment Lumbar Assessment:  Exceptions to [facility] (posterior pelvic tilt with depressed R sided innominate) Postural Control Postural Control: Deficits on evaluation (poor trunk control with anterior, posterior and L sided leans requiring Min to [place] to maintain upright balannce. Requires +2 for safety)  Balance Balance Balance Assessed: Yes Standardized Balance Assessment Standardized Balance Assessment: PASS Postural Assessment Scale for Stroke Patients=PASS 1. Sitting Without Support: Cannot sit 2. Standing With Support: Can stand with strong support of 2 people 3. Standing Without Support: Cannot stand without support 4.Standing on Nonparetic Leg: Cannot stand on nonparetic leg 5.Standing on Paretic Leg: Cannot stand on paretic leg MAINTAINING POSTURE SUBTOTAL: 1 6. Supine to Paretic Side Lateral: Can perform with much help 7. Supine to Nonparetic Side Lateral: Cannot perform 8. Supine to Sitting Up on the Edge of the Mat: Can perform with much help 9. Sitting on the Edge of the Mat to Supine: Cannot perform 10. Sitting to Standing Up: Can perform with much help 11. Standing Up  to Sitting Down: Can perform with much help 12. Standing,Picking Up a Pencil from the Floor: Cannot perform CHANGING POSTURE SUBTOTAL: 4 PASS TOTAL SCORE: 5 Static Sitting Balance Static Sitting - Balance Support: Feet supported;Right upper extremity supported Static Sitting - Level of Assistance: 1: +2 Total assist Dynamic Sitting Balance Sitting balance - Comments: Static sitting: Total A of 1 Static Standing Balance Static Standing - Balance Support: Right upper extremity supported;During functional activity Extremity Assessment  RUE Assessment RUE Assessment: Within Functional Limits LUE Assessment LUE Assessment: Exceptions to WFL (+subluxation) LUE Body System: Neuro Brunstrum levels for arm and hand: Arm;Hand Brunstrum level for arm: Stage I Presynergy Brunstrum level for hand: Stage II Synergy is developing LUE Tone LUE Tone: Hypotonic RLE Assessment General Strength Comments: Not tested formally; functionally [facility] LLE Assessment General Strength Comments: Not tested formally; functionally muscle activation in quad and glute max noted with short arc minisquats  Care Tool Care Tool Bed Mobility Roll left and right activity   Roll left and right assist level: Maximal Assistance - Patient 25 - 49%    Sit to lying activity   Sit to lying assist level: Maximal Assistance - Patient 25 - 49%    Lying to sitting on side of bed activity   Lying to sitting on side of bed assist level: the ability to move from lying on the back to sitting on the side of the bed with no back support.: Total Assistance - Patient < 25%     Care Tool Transfers Sit to stand transfer   Sit to stand assist level: Maximal Assistance - Patient 25 - 49%    Chair/bed transfer Chair/bed transfer activity did not occur: Safety/medical concerns       Toilet transfer Toilet transfer activity did not occur: Safety/medical concerns      Scientist, product/process development transfer activity did not occur: Safety/medical  concerns        Care Tool Locomotion Ambulation Ambulation activity did not occur: Safety/medical concerns        Walk 10 feet activity Walk 10 feet activity did not occur: Safety/medical concerns       Walk 50 feet with 2 turns activity Walk 50 feet with 2 turns activity did not occur: Safety/medical concerns      Walk 150 feet activity Walk 150 feet activity did not occur: Safety/medical concerns      Walk 10 feet on uneven surfaces activity Walk 10 feet on uneven surfaces activity did not occur: Safety/medical concerns  Stairs Stair activity did not occur: Safety/medical concerns        Walk up/down 1 step activity Walk up/down 1 step or curb (drop down) activity did not occur: Safety/medical concerns      Walk up/down 4 steps activity Walk up/down 4 steps activity did not occur: Safety/medical concerns      Walk up/down 12 steps activity Walk up/down 12 steps activity did not occur: Safety/medical concerns      Pick up small objects from floor Pick up small object from the floor (from standing position) activity did not occur: Safety/medical concerns      Wheelchair Is the patient using a wheelchair?: No Type of Wheelchair: Manual Wheelchair activity did not occur: Safety/medical concerns      Wheel 50 feet with 2 turns activity Wheelchair 50 feet with 2 turns activity did not occur: Safety/medical concerns    Wheel 150 feet activity Wheelchair 150 feet activity did not occur: Safety/medical concerns      Refer to Care Plan for Long Term Goals  SHORT TERM GOAL WEEK 1 PT Short Term Goal 1 (Week 1): Pt will improve trunk strength and sitting balance in order to sit unsupported with MinA for 2 minutes. PT Short Term Goal 2 (Week 1): Pt will perform sit<>supine with MaxA +1. PT Short Term Goal 3 (Week 1): Pt will perform sit<>stand with pull-to-stand technique with consistent MinA. PT Short Term Goal 4 (Week 1): Pt will maintain static standing balance with  LRAD and MaxA +1. PT Short Term Goal 5 (Week 1): Pt will perform stand pivot transfer with ModA +2  Recommendations for other services: None   Skilled Therapeutic Intervention Mobility Bed Mobility Bed Mobility: Rolling Right;Rolling Left Rolling Right: Total Assistance - Patient < 25% Rolling Left: Moderate Assistance - Patient 50-74% Transfers Transfers: Transfer via Geophysicist/field seismologist;Sit to Stand;Stand to Sit Sit to Stand: 2 Helpers Stand to Sit: 2 Press photographer (Assistive device): Other (Comment) Transfer via Lift Equipment: Animal nutritionist: No Gait Gait: No Stairs / Additional Locomotion Stairs: No Wheelchair Mobility Wheelchair Mobility: No   PT Evaluation completed; see above for results. PT educated patient in roles of PT vs OT, PT POC, rehab potential, rehab goals, and discharge recommendations along with recommendation for follow-up rehabilitation services. Individual treatment initiated:  Patient supine in bed upon PT arrival. OT completing eval on arrival. Patient alert and agreeable to PT session. OT relates potential for L shoulder subluxation. During session, pt's shoulder position assessed and no sublux noted, but pt assessed in supine and will need further assessment outside of supine as pt's severe scoliosis may affect L humeral head/ scapular positioning.  No pain complaint during session.   TIS w/c located and provided to pt.   Therapeutic Activity: Bed Mobility: Patient performed bed rolling  throughout session as pt requires cleanup after BM with strong effort during sit<>stand. Requires vc/ tc for technique to each side but is able to reach across with R hand to bedrail. Guided RLE into hooklying position to assist with push of lower body over. Supine-->sit requires MaxA with roll to sidelying and then rise to sit EOB. No ability to balance while seated EOB requiring TotA +2 initially with vc/ tc to find balance and midline and encouraged  to use RUE to balance. Brief periods of CGA while seated EOB with majority of time seated requiring Min/ ModA to maintain. Pt with extreme forward and L sided bias. Sit-->supine with MaxA. Provided verbal cues for initiation  and technique. Transfers: Patient performed sit <> stand during session with +2 at [facility]. Is able to pull at bar with ModA to rise. Demonstrates ability to produce quad muscle activation with L knee brought to near extension and pt able to perform from there with AROM. Provided vc/tc for technique throughout all attempts.  Neuromuscular Re-ed: NMR facilitated during session with focus onsitting and standing balance. Pt guided in attempt to find midline as well as hold sitting position while seated EOB. Is able to improve lean forward as well as lean to L side however is unable to hold. Standing balance/ tolerance performed in STEDY and requires RUE support to complete. Demonstrates stance for up to 2 min with few minisquats added in minimal flexed position and MinA to complete.  NMR performed for improvements in motor control and coordination, balance, sequencing, judgement, and self confidence/ efficacy in performing all aspects of mobility at highest level of independence.   Patient supine  in bed at end of session with brakes locked, bed alarm set, and all needs within reach. Pt positioned with pillows in bed in order to promote better positioning to pt's R side. Large bulge at L side of neck seen bulging at end of session. RN notified.   Discharge Criteria: Patient will be discharged from PT if patient refuses treatment 3 consecutive times without medical reason, if treatment goals not met, if there is a change in medical status, if patient makes no progress towards goals or if patient is discharged from hospital.  The above assessment, treatment plan, treatment alternatives and goals were discussed and mutually agreed upon: by patient and by family  Alger Simons 07/16/2021,  3:11 PM

## 2021-07-16 NOTE — Progress Notes (Signed)
Occupational Therapy Session Note  Patient Details  Name: Michelle Bauer MRN: [id number] Date of Birth: Jan 12, 1941  Today's Date: 07/16/2021 OT Individual Time: 6226-3335 OT Individual Time Calculation (min): 40 min    Short Term Goals: Week 1:  OT Short Term Goal 1 (Week 1): Pt will complete UB ADL EOB with +2 assist to work on sitting balance OT Short Term Goal 2 (Week 1): Pt will complete 1 grooming task with Mod A using compensatory strategies as needed OT Short Term Goal 3 (Week 1): Pt will complete toilet transfer with 2 assist and LRAD to promote OOB toileting  Skilled Therapeutic Interventions/Progress Updates:  Skilled OT intervention completed with focus on bed mobility, sitting balance, scanning for L inattention and L NMR. Pt received in sidelying with daughter present, and pt agreeable to session. Therapist educating pt and daughter on OT purpose, rehab goals, as well as encouraging pt's daughter to bring in clothes for pt for functional participation in ADLs with OT. Therapist encouraging pt to sit up at EOB, with pt requiring total A to advance LLE, min advancement completed on pt's RLE with max multimodal cues provided. Pt with history of severe scoliosis which limited pt's midline orientation while sitting EOB, but pt able to semi-sit upright with BUE supported with total A. Pt unable to scoot hips forward without posterior LOB at the trunk despite total A. Pt's daughter reporting that pt stated being tired shortly before OT arrival, with pt requesting to lay back in bed, with pt tolerating only about 2 mins sitting EOB with total A to remain upright. Pt requiring total A to return supine in bed, and total A +2 and bed in trendelenburg position to bring pt up to [facility]. While sitting upright, pt participated in table top task for L scanning task using clips, with pt able to attend to the L side with no difficulty to retrieve clips and place on her R. Therapist noting that pt has slight  return in some of her L digits, especially the thumb. Therapist providing hand over hand assist during NMR of LUE with pt visually attending to picking up/releasing clip during task. Pt able to brush hair at bed level with supervision. Pt able to verbalize that she was comfortable in long sitting, with LUE propped with pillows. Pt left in bed with bed alarm on, daughter in room and all needs in reach and on pt's intact side at end of session.    Therapy Documentation Precautions:  Precautions Precautions: Fall Precaution Comments: Dense L hemi with absent sensation, severe scoliosis, 2L 02, possible L shoulder sublux  Pain: No c/o pain   Therapy/Group: Individual Therapy  Raneen Jaffer E Zia Najera 07/16/2021, 7:51 AM

## 2021-07-16 NOTE — Plan of Care (Signed)
°  Problem: RH Balance Goal: LTG: Patient will maintain dynamic sitting balance (OT) Description: LTG:  Patient will maintain dynamic sitting balance with assistance during activities of daily living (OT) Flowsheets (Taken 07/16/2021 1548) LTG: Pt will maintain dynamic sitting balance during ADLs with: Minimal Assistance - Patient > 75%   Problem: RH Grooming Goal: LTG Patient will perform grooming w/assist,cues/equip (OT) Description: LTG: Patient will perform grooming with assist, with/without cues using equipment (OT) Flowsheets (Taken 07/16/2021 1548) LTG: Pt will perform grooming with assistance level of: Minimal Assistance - Patient > 75%   Problem: RH Bathing Goal: LTG Patient will bathe all body parts with assist levels (OT) Description: LTG: Patient will bathe all body parts with assist levels (OT) Flowsheets (Taken 07/16/2021 1548) LTG: Pt will perform bathing with assistance level/cueing: Minimal Assistance - Patient > 75%   Problem: RH Dressing Goal: LTG Patient will perform upper body dressing (OT) Description: LTG Patient will perform upper body dressing with assist, with/without cues (OT). Flowsheets (Taken 07/16/2021 1548) LTG: Pt will perform upper body dressing with assistance level of: Minimal Assistance - Patient > 75% Goal: LTG Patient will perform lower body dressing w/assist (OT) Description: LTG: Patient will perform lower body dressing with assist, with/without cues in positioning using equipment (OT) Flowsheets (Taken 07/16/2021 1548) LTG: Pt will perform lower body dressing with assistance level of: Minimal Assistance - Patient > 75%   Problem: RH Toileting Goal: LTG Patient will perform toileting task (3/3 steps) with assistance level (OT) Description: LTG: Patient will perform toileting task (3/3 steps) with assistance level (OT)  Flowsheets (Taken 07/16/2021 1548) LTG: Pt will perform toileting task (3/3 steps) with assistance level: Moderate Assistance -  Patient 50 - 74%   Problem: RH Toilet Transfers Goal: LTG Patient will perform toilet transfers w/assist (OT) Description: LTG: Patient will perform toilet transfers with assist, with/without cues using equipment (OT) Flowsheets (Taken 07/16/2021 1548) LTG: Pt will perform toilet transfers with assistance level of: Minimal Assistance - Patient > 75%   Problem: RH Tub/Shower Transfers Goal: LTG Patient will perform tub/shower transfers w/assist (OT) Description: LTG: Patient will perform tub/shower transfers with assist, with/without cues using equipment (OT) Flowsheets (Taken 07/16/2021 1548) LTG: Pt will perform tub/shower stall transfers with assistance level of: Minimal Assistance - Patient > 75%

## 2021-07-16 NOTE — Progress Notes (Signed)
PROGRESS NOTE   Subjective/Complaints: Pt slow to arouse when I arrived this morning. No issues over night per nursing  ROS: Limited due to cognitive/behavioral    Objective:   DG Abd Portable 1V  Result Date: 07/14/2021 CLINICAL DATA:  Abdominal pain. EXAM: PORTABLE ABDOMEN - 1 VIEW COMPARISON:  None. FINDINGS: The stomach is dilated and air-filled. No other dilated bowel loops are seen. Air seen to the level of the rectum. There is a large amount of stool throughout the colon. No suspicious calcifications. There is scoliosis of the thoracolumbar spine. Sternotomy wires, cholecystectomy clips and right hip screw are present. IMPRESSION: 1. The stomach is dilated and air-filled. Please correlate clinically. Findings may related to gastric outlet obstruction, gastroparesis or other etiology. Electronically Signed   By: Ronney Asters M.D.   On: 07/14/2021 19:12   No results for input(s): WBC, HGB, HCT, PLT in the last 72 hours. No results for input(s): NA, K, CL, CO2, GLUCOSE, BUN, CREATININE, CALCIUM in the last 72 hours.  Intake/Output Summary (Last 24 hours) at 07/16/2021 1420 Last data filed at 07/15/2021 1800 Gross per 24 hour  Intake 100 ml  Output --  Net 100 ml        Physical Exam: Vital Signs Blood pressure (!) 179/69, pulse 64, temperature 98.2 F (36.8 C), temperature source Oral, resp. rate 14, height 5\' 3"  (1.6 m), weight 71.8 kg, SpO2 93 %.  General: Alert and oriented x 3, No apparent distress HEENT: Head is normocephalic, atraumatic, PERRLA, EOMI, sclera anicteric, oral mucosa pink and moist, dentition intact, ext ear canals clear,  Neck: Supple without JVD or lymphadenopathy Heart: Reg rate and rhythm. No murmurs rubs or gallops Chest: CTA bilaterally without wheezes, rales, or rhonchi; no distress Abdomen: Soft, non-tender, non-distended, bowel sounds positive. Extremities: No clubbing, cyanosis, or edema.  Pulses are 2+ Psych: Pt's affect is appropriate. Pt is cooperative Skin: Clean and intact without signs of breakdown Neuro:  pt slow, delayed. Speech slurred, limited insight and awareness. Did follow simple commands. LUE 0/5. LLE 0/5. Moves right side spontaneously. Decreased left sided sensation and left inattention Musculoskeletal: subluxation left shoulder    Assessment/Plan: 1. Functional deficits which require 3+ hours per day of interdisciplinary therapy in a comprehensive inpatient rehab setting. Physiatrist is providing close team supervision and 24 hour management of active medical problems listed below. Physiatrist and rehab team continue to assess barriers to discharge/monitor patient progress toward functional and medical goals  Care Tool:  Bathing    Body parts bathed by patient: Chest, Abdomen   Body parts bathed by helper: Right arm, Left arm, Front perineal area, Buttocks, Right upper leg, Left upper leg, Right lower leg, Left lower leg, Face     Bathing assist Assist Level: Maximal Assistance - Patient 24 - 49%     Upper Body Dressing/Undressing Upper body dressing   What is the patient wearing?: Pull over shirt    Upper body assist Assist Level: Maximal Assistance - Patient 25 - 49%    Lower Body Dressing/Undressing Lower body dressing      What is the patient wearing?: Incontinence brief, Pants     Lower body assist Assist  for lower body dressing: Dependent - Patient 0%     Toileting Toileting    Toileting assist Assist for toileting: Dependent - Patient 0%     Transfers Chair/bed transfer  Transfers assist           Locomotion Ambulation   Ambulation assist              Walk 10 feet activity   Assist           Walk 50 feet activity   Assist           Walk 150 feet activity   Assist           Walk 10 feet on uneven surface  activity   Assist           Wheelchair     Assist Is the patient  using a wheelchair?: No   Wheelchair activity did not occur: Safety/medical concerns         Wheelchair 50 feet with 2 turns activity    Assist            Wheelchair 150 feet activity     Assist      Assist Level: 2 helpers   Blood pressure (!) 179/69, pulse 64, temperature 98.2 F (36.8 C), temperature source Oral, resp. rate 14, height 5\' 3"  (1.6 m), weight 71.8 kg, SpO2 93 %.  Medical Problem List and Plan: 1. 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  Antithrombotics: -DVT/anticoagulation:  Pharmaceutical: Lovenox             -antiplatelet therapy: N/a due to thalamic bleed.  3. Pain Management: Tylenol or tramadol prn.  4. Insomnia: continue melatonin 3mg  HS for now             -antipsychotic agents: N/A 5. Neuropsych: This patient is not capable of making decisions on her own behalf. 6. Skin/Wound Care: Routine pressure relief measures.  7. Fluids/Electrolytes/Nutrition: Monitor I/O. Check CMET monday             --offer supplements between meals due to low calorie malnutrition (TP-5.6/alb-2.1)  8. A fib: Monitor HR TID--continue amiodarone and metoprolol.   --Off Eliquis till follow up with neurology.  9. HTN: Monitor BP TID--SBP goal, 160. On metoprolol 25 mg bid with amiodarone 200 mg/day. Continue amlodipine 2.5mg .   -1/28 bp under reasonable control until this morning--observe 10. Hypoxia: Continue 2 L per [place] and wean oxygen as able.              --encourage IS/Flutter valve.  11. E coli UTI: Completed 5 day course of antibiotic on 01/22.  12. GAD/depression: Manage with Lexapro and Klonopin BID (took QID at home?) per Dr. Elder Love.             -keep sleep wake chart.   -daughter says klonopin works well for her  -need  to be careful not to oversedate her 13. ABLA: Recheck CBC on Monday  14. Overweight: BMI 28.04- provide dietary education.      LOS: 1 days A FACE TO FACE EVALUATION WAS PERFORMED  Meredith Staggers 07/16/2021,  2:20 PM

## 2021-07-17 MED ORDER — CLONAZEPAM 0.25 MG PO TBDP
0.2500 mg | ORAL_TABLET | Freq: Two times a day (BID) | ORAL | Status: DC | PRN
  Administered 2021-08-03: 0.25 mg via ORAL
  Filled 2021-07-17: qty 1

## 2021-07-17 NOTE — Progress Notes (Signed)
PROGRESS NOTE   Subjective/Complaints: Pt much more alert this morning. Says she didn't sleep real well. Denied pain. Ate about 50% of her breakfast.  ROS: Limited due to cognitive/behavioral    Objective:   No results found. No results for input(s): WBC, HGB, HCT, PLT in the last 72 hours. No results for input(s): NA, K, CL, CO2, GLUCOSE, BUN, CREATININE, CALCIUM in the last 72 hours. No intake or output data in the 24 hours ending 07/17/21 0759       Physical Exam: Vital Signs Blood pressure 134/66, pulse 72, temperature 98.4 F (36.9 C), temperature source Oral, resp. rate 14, height 5\' 3"  (1.6 m), weight 71.8 kg, SpO2 97 %.  Constitutional: No distress . Vital signs reviewed. HEENT: NCAT, EOMI, oral membranes moist Neck: supple Cardiovascular: RRR without murmur. No JVD    Respiratory/Chest: CTA Bilaterally without wheezes or rales. Normal effort    GI/Abdomen: BS +, non-tender, non-distended Ext: no clubbing, cyanosis, or edema Psych: pleasant and cooperative  Skin: Clean and intact without signs of breakdown Neuro:  pt slow, delayed. Speech slurred, limited insight and awareness. Did follow simple commands. LUE 0/5. LLE 0/5. Moves right side spontaneously. Decreased left sided sensation and left inattention Musculoskeletal: subluxation left shoulder    Assessment/Plan: 1. Functional deficits which require 3+ hours per day of interdisciplinary therapy in a comprehensive inpatient rehab setting. Physiatrist is providing close team supervision and 24 hour management of active medical problems listed below. Physiatrist and rehab team continue to assess barriers to discharge/monitor patient progress toward functional and medical goals  Care Tool:  Bathing    Body parts bathed by patient: Chest, Abdomen   Body parts bathed by helper: Right arm, Left arm, Front perineal area, Buttocks, Right upper leg, Left upper  leg, Right lower leg, Left lower leg, Face     Bathing assist Assist Level: Maximal Assistance - Patient 24 - 49%     Upper Body Dressing/Undressing Upper body dressing   What is the patient wearing?: Pull over shirt    Upper body assist Assist Level: Maximal Assistance - Patient 25 - 49%    Lower Body Dressing/Undressing Lower body dressing      What is the patient wearing?: Incontinence brief, Pants     Lower body assist Assist for lower body dressing: Dependent - Patient 0%     Toileting Toileting    Toileting assist Assist for toileting: Dependent - Patient 0%     Transfers Chair/bed transfer  Transfers assist  Chair/bed transfer activity did not occur: Safety/medical concerns        Locomotion Ambulation   Ambulation assist   Ambulation activity did not occur: Safety/medical concerns          Walk 10 feet activity   Assist  Walk 10 feet activity did not occur: Safety/medical concerns        Walk 50 feet activity   Assist Walk 50 feet with 2 turns activity did not occur: Safety/medical concerns         Walk 150 feet activity   Assist Walk 150 feet activity did not occur: Safety/medical concerns         Walk  10 feet on uneven surface  activity   Assist Walk 10 feet on uneven surfaces activity did not occur: Safety/medical concerns         Wheelchair     Assist Is the patient using a wheelchair?: No Type of Wheelchair: Manual Wheelchair activity did not occur: Safety/medical concerns         Wheelchair 50 feet with 2 turns activity    Assist    Wheelchair 50 feet with 2 turns activity did not occur: Safety/medical concerns       Wheelchair 150 feet activity     Assist  Wheelchair 150 feet activity did not occur: Safety/medical concerns   Assist Level: 2 helpers   Blood pressure 134/66, pulse 72, temperature 98.4 F (36.9 C), temperature source Oral, resp. rate 14, height 5\' 3"  (1.6 m), weight  71.8 kg, SpO2 97 %.  Medical Problem List and Plan: 1. 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  Antithrombotics: -DVT/anticoagulation:  Pharmaceutical: Lovenox             -antiplatelet therapy: N/a due to thalamic bleed.  3. Pain Management: Tylenol or tramadol prn.  4. Insomnia: continue melatonin 3mg  HS for now             -antipsychotic agents: N/A 5. Neuropsych: This patient is not capable of making decisions on her own behalf. 6. Skin/Wound Care: Routine pressure relief measures.  7. Fluids/Electrolytes/Nutrition: Monitor I/O. Check CMET monday             --offering supplements between meals due to low calorie malnutrition (TP-5.6/alb-2.1), family can help too  -1/29 ate little yesterday but did a fairly good job on breakfast today   -check prealbumin tomorrow 8. A fib: Monitor HR TID--continue amiodarone and metoprolol.   --Off Eliquis till follow up with neurology.  9. HTN: Monitor BP TID--SBP goal, 160. On metoprolol 25 mg bid with amiodarone 200 mg/day. Continue amlodipine 2.5mg .   -1/29 bp under reasonable control until this morning--observe 10. Hypoxia: Continue 2 L per [place] and wean oxygen as able.              --encourage IS/Flutter valve.  11. E coli UTI: Completed 5 day course of antibiotic on 01/22.  12. GAD/depression: Manage with Lexapro and Klonopin BID (took QID at home?) per Dr. Elder Love.             - sleep wake chart.   -daughter says klonopin works well for her   -need to be careful not to oversedate   -1/29 much more alert today 13. ABLA: Recheck CBC on Monday  14. Overweight: BMI 28.04- provide dietary education.      LOS: 2 days A FACE TO FACE EVALUATION WAS PERFORMED  Meredith Staggers 07/17/2021, 7:59 AM

## 2021-07-18 ENCOUNTER — Inpatient Hospital Stay (HOSPITAL_COMMUNITY): Payer: Medicare Other

## 2021-07-18 LAB — COMPREHENSIVE METABOLIC PANEL
ALT: 14 U/L (ref 0–44)
AST: 16 U/L (ref 15–41)
Albumin: 2.4 g/dL — ABNORMAL LOW (ref 3.5–5.0)
Alkaline Phosphatase: 56 U/L (ref 38–126)
Anion gap: 8 (ref 5–15)
BUN: 23 mg/dL (ref 8–23)
CO2: 27 mmol/L (ref 22–32)
Calcium: 8.6 mg/dL — ABNORMAL LOW (ref 8.9–10.3)
Chloride: 104 mmol/L (ref 98–111)
Creatinine, Ser: 0.97 mg/dL (ref 0.44–1.00)
GFR, Estimated: 59 mL/min — ABNORMAL LOW (ref >60)
Glucose, Bld: 102 mg/dL — ABNORMAL HIGH (ref 70–99)
Potassium: 4 mmol/L (ref 3.5–5.1)
Sodium: 139 mmol/L (ref 135–145)
Total Bilirubin: 0.4 mg/dL (ref 0.3–1.2)
Total Protein: 6.2 g/dL — ABNORMAL LOW (ref 6.5–8.1)

## 2021-07-18 LAB — CBC WITH DIFFERENTIAL/PLATELET
Abs Immature Granulocytes: 0.01 10*3/uL (ref 0.00–0.07)
Basophils Absolute: 0.1 10*3/uL (ref 0.0–0.1)
Basophils Relative: 1 %
Eosinophils Absolute: 0.1 10*3/uL (ref 0.0–0.5)
Eosinophils Relative: 2 %
HCT: 33.2 % — ABNORMAL LOW (ref 36.0–46.0)
Hemoglobin: 10.2 g/dL — ABNORMAL LOW (ref 12.0–15.0)
Immature Granulocytes: 0 %
Lymphocytes Relative: 26 %
Lymphs Abs: 1.5 10*3/uL (ref 0.7–4.0)
MCH: 29.1 pg (ref 26.0–34.0)
MCHC: 30.7 g/dL (ref 30.0–36.0)
MCV: 94.9 fL (ref 80.0–100.0)
Monocytes Absolute: 0.5 10*3/uL (ref 0.1–1.0)
Monocytes Relative: 8 %
Neutro Abs: 3.7 10*3/uL (ref 1.7–7.7)
Neutrophils Relative %: 63 %
Platelets: 255 10*3/uL (ref 150–400)
RBC: 3.5 MIL/uL — ABNORMAL LOW (ref 3.87–5.11)
RDW: 13.5 % (ref 11.5–15.5)
WBC: 5.9 10*3/uL (ref 4.0–10.5)
nRBC: 0 % (ref 0.0–0.2)

## 2021-07-18 LAB — PREALBUMIN: Prealbumin: 16.8 mg/dL — ABNORMAL LOW (ref 18–38)

## 2021-07-18 MED ORDER — SORBITOL 70 % SOLN
30.0000 mL | Freq: Once | Status: AC
  Administered 2021-07-18: 30 mL via ORAL
  Filled 2021-07-18: qty 30

## 2021-07-18 NOTE — Progress Notes (Signed)
Delle Reining, PA Notified of patient's DG [facility]. No new orders.  Tilden Dome, LPN

## 2021-07-18 NOTE — Progress Notes (Signed)
Inpatient Rehabilitation Care Coordinator Assessment and Plan Patient Details  Name: Michelle Bauer MRN: [id number] Date of Birth: 1940-07-10  Today's Date: 07/18/2021  Hospital Problems: Principal Problem:   ICH (intracerebral hemorrhage) ([place])  Past Medical History:  Past Medical History:  Diagnosis Date   Anxiety    Depression    Essential hypertension    Hyperlipidemia    PAF (paroxysmal atrial fibrillation) ([place])    a. 02/2020 Post-op Afib after R Femur FX and IM nail (also hypokalemic)-->Amio/Eliquis (CHA2DS2VASc = 4).   Past Surgical History:  Past Surgical History:  Procedure Laterality Date   ABDOMINAL HYSTERECTOMY     BYPASS AXILLA/BRACHIAL ARTERY     CHOLECYSTECTOMY     COLONOSCOPY WITH PROPOFOL N/A 03/07/2018   Procedure: COLONOSCOPY WITH PROPOFOL;  Surgeon: Lucilla Lame, MD;  Location: [facility] ENDOSCOPY;  Service: Endoscopy;  Laterality: N/A;   INTRAMEDULLARY (IM) NAIL INTERTROCHANTERIC Right 03/10/2020   Procedure: INTRAMEDULLARY (IM) NAIL INTERTROCHANTRIC;  Surgeon: Thornton Park, MD;  Location: [facility] ORS;  Service: Orthopedics;  Laterality: Right;   Social History:  reports that she has never smoked. She has never used smokeless tobacco. She reports that she does not drink alcohol and does not use drugs.  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 Education: high school Health Literacy - How often do you  need to have someone help you when you read instructions, pamphlets, or other written material from your doctor or pharmacy?: Never Writes: Yes Employment Status: Unemployed Public relations account executive Issues: Denies Guardian/Conservator: N/A   Abuse/Neglect Abuse/Neglect Assessment Can Be Completed: Yes Physical Abuse: Denies Verbal Abuse: Denies Sexual Abuse: Denies Exploitation of patient/patient's resources: Denies Self-Neglect: Denies  Patient response to: Social Isolation - How often do you feel lonely or isolated from those around you?: Never  Emotional Status Pt's affect, behavior and adjustment status: Pt did not speak much during assessment. Pt appeared to be tired as SW came to room after PT session. Recent Psychosocial Issues: Denies Psychiatric History: Pt dtr Michelle Bauer reports that pt has anxiety, and has psychiatrist that presrices Klonopin. Substance Abuse History: Denies  Patient / Family Perceptions, Expectations & Goals Pt/Family understanding of illness & functional limitations: Pt and dtr have a general understanding of pt care needs Premorbid pt/family roles/activities: Independent` Anticipated changes in roles/activities/participation: Assistance with ADLs/IADLs Pt/family expectations/goals: Patients goal is to work on standing, and using hands to help eat better, and get to bathroom on own. Pt dtr Michelle Bauer's goal is for pt to be able to get up and get to bathroom on her own due to previous bowel incontient issues (maintain dignity).  Community Resources [facility]: Other (Comment) (ALF- [facility] in [place], [place]) Premorbid Home Care/DME Agencies: None Transportation available at discharge: TBD Is the patient able to respond to transportation needs?: Yes In the past 12 months, has lack of transportation kept you from medical appointments or from getting medications?: No In the past 12 months, has lack of transportation kept you from meetings, work, or  from getting things needed for daily living?: No Resource referrals recommended:  Neuropsychology  Discharge Planning Living Arrangements: Other (Comment) (ALF) Support Systems: Children, Home care staff Type of Residence: [place] Name: Other (enter name of facility below) [place] Name: [facility] in [place], [place] (specify) ([place]) Financial Resources: Family Support Financial Screen Referred: No Living Expenses: Other (Comment) Money Management: Family Does the patient have any problems obtaining your medications?: No Home Management: Pt lives in ALF and has assistance with meal prep,and housekeeping. Patient/Family Preliminary Plans: TBD based on pt care needs. Possibly SNF if unable to return to ALF at this time. Care Coordinator Anticipated Follow Up Needs: HH/OP, ALF/IL, SNF Expected length of stay: 21- 28 days  Clinical Impression SW met with pt and pt dtr Michelle Bauer in room to introduce self, explain role, and discuss discharge process. 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 POA- dtr Michelle Bauer. Pt lives in [place]. SW explained process to return to ALF with private aide and therapies. SW shared will help coordinate even if pt will d/c to SNF. SW provided SNF list for review if this is decided. She indicates she is primary contact. She will be out of town on vacation Thursday-Sunday, and her sister Michelle Bauer will be visiting pt during this time. SW informed will provide updates after team conference.   1024- SW left message for Michelle Bauer (business office) with Michelle Bauer (P:[phone]/f:[phone]) to discuss d/c plan and waiting on follow-up.  [place] returned phone call to Michelle Bauer and Development worker, international aid to discuss pt admission and d/c plan. Reports they are willing to accept pt back at ALF. SW informed will continue to provide updates on pt d/c needs and date. ALF is not contracted with private aide agency and will  need to determine if the agency can provide care. If d/c to SNF, SW will coordinate DME needs before she leaves to ensure items are at the facility.   Michelle Bauer 07/18/2021, 6:49 PM

## 2021-07-18 NOTE — Plan of Care (Signed)
°  Problem: RH Balance Goal: LTG Patient will maintain dynamic sitting balance (PT) Description: LTG:  Patient will maintain dynamic sitting balance with assistance during mobility activities (PT) Flowsheets (Taken 07/16/2021 1536) LTG: Pt will maintain dynamic sitting balance during mobility activities with:: Contact Guard/Touching assist Goal: LTG Patient will maintain dynamic standing balance (PT) Description: LTG:  Patient will maintain dynamic standing balance with assistance during mobility activities (PT) Flowsheets (Taken 07/16/2021 1536) LTG: Pt will maintain dynamic standing balance during mobility activities with:: Minimal Assistance - Patient > 75%   Problem: Sit to Stand Goal: LTG:  Patient will perform sit to stand with assistance level (PT) Description: LTG:  Patient will perform sit to stand with assistance level (PT) Flowsheets (Taken 07/16/2021 1536) LTG: PT will perform sit to stand in preparation for functional mobility with assistance level: Minimal Assistance - Patient > 75%   Problem: RH Bed Mobility Goal: LTG Patient will perform bed mobility with assist (PT) Description: LTG: Patient will perform bed mobility with assistance, with/without cues (PT). Flowsheets (Taken 07/16/2021 1536) LTG: Pt will perform bed mobility with assistance level of: Minimal Assistance - Patient > 75%   Problem: RH Bed to Chair Transfers Goal: LTG Patient will perform bed/chair transfers w/assist (PT) Description: LTG: Patient will perform bed to chair transfers with assistance (PT). Flowsheets (Taken 07/16/2021 1536) LTG: Pt will perform Bed to Chair Transfers with assistance level: Minimal Assistance - Patient > 75%   Problem: RH Car Transfers Goal: LTG Patient will perform car transfers with assist (PT) Description: LTG: Patient will perform car transfers with assistance (PT). Flowsheets (Taken 07/16/2021 1536) LTG: Pt will perform car transfers with assist:: Minimal Assistance - Patient  > 75%   Problem: RH Ambulation Goal: LTG Patient will ambulate in controlled environment (PT) Description: LTG: Patient will ambulate in a controlled environment, # of feet with assistance (PT). Flowsheets (Taken 07/16/2021 1536) LTG: Pt will ambulate in controlled environ  assist needed:: Minimal Assistance - Patient > 75% LTG: Ambulation distance in controlled environment: at least 50 feet using LRAD   Problem: RH Wheelchair Mobility Goal: LTG Patient will propel w/c in controlled environment (PT) Description: LTG: Patient will propel wheelchair in controlled environment, # of feet with assist (PT) Flowsheets (Taken 07/16/2021 1536) LTG: Pt will propel w/c in controlled environ  assist needed:: Supervision/Verbal cueing LTG: Propel w/c distance in controlled environment: at least 100 ft

## 2021-07-18 NOTE — Progress Notes (Signed)
Inpatient Rehabilitation  Patient information reviewed and entered into eRehab system by Tajah Schreiner Dewaine Morocho, OTR/L.   Information including medical coding, functional ability and quality indicators will be reviewed and updated through discharge.    

## 2021-07-18 NOTE — IPOC Note (Signed)
Overall Plan of Care [facility]) Patient Details Name: Michelle Bauer MRN: [id number] DOB: 1941/05/22  Admitting Diagnosis: ICH (intracerebral hemorrhage) [facility])  Hospital Problems: Principal Problem:   ICH (intracerebral hemorrhage) (HCC)     Functional Problem List: Nursing Bladder, Medication Management, Safety, Bowel, Endurance, Skin Integrity, Pain  PT Balance, Endurance, Edema, Motor, Nutrition, Perception, Safety, Sensory, Skin Integrity  OT Balance, Sensory, Skin Integrity, Cognition, Endurance, Motor, Perception, Safety  SLP    TR         Basic ADLs: OT Grooming, Bathing, Dressing, Toileting     Advanced  ADLs: OT Simple Meal Preparation     Transfers: PT Bed Mobility, Bed to Chair, Car, Occupational psychologist, Research scientist (life sciences): PT Ambulation, Psychologist, prison and probation services     Additional Impairments: OT Fuctional Use of Upper Extremity  SLP        TR      Anticipated Outcomes Item Anticipated Outcome  Self Feeding No goal  Swallowing      Basic self-care  Min A  Toileting  Min A   Bathroom Transfers Min A  Bowel/Bladder  manage bowel with mod I and bladder with min assist  Transfers  CGA  Locomotion  MinA  Communication     Cognition     Pain  pain at or below level 4 with prn meds  Safety/Judgment  maintain safety w cues   Therapy Plan: PT Intensity: Minimum of 1-2 x/day ,45 to 90 minutes PT Frequency: 5 out of 7 days PT Duration Estimated Length of Stay: 21-28 days OT Intensity: Minimum of 1-2 x/day, 45 to 90 minutes OT Frequency: 5 out of 7 days OT Duration/Estimated Length of Stay: 3-4 weeks     Due to the current state of emergency, patients may not be receiving their 3-hours of Medicare-mandated therapy.   Team Interventions: Nursing Interventions Patient/Family Education, Bowel Management, Pain Management, Skin Care/Wound Management, Medication Management, Disease Management/Prevention, Bladder Management, Discharge Planning  PT  interventions Ambulation/gait training, Community reintegration, DME/adaptive equipment instruction, Neuromuscular re-education, Psychosocial support, Stair training, UE/LE Strength taining/ROM, Wheelchair propulsion/positioning, UE/LE Coordination activities, Therapeutic Activities, Skin care/wound management, Pain management, Functional electrical stimulation, Discharge planning, Balance/vestibular training, Cognitive remediation/compensation, Disease management/prevention, Functional mobility training, Patient/family education, Splinting/orthotics, Therapeutic Exercise, Visual/perceptual remediation/compensation  OT Interventions Balance/vestibular training, Disease mangement/prevention, Neuromuscular re-education, Self Care/advanced ADL retraining, Therapeutic Exercise, Wheelchair propulsion/positioning, UE/LE Strength taining/ROM, Cognitive remediation/compensation, DME/adaptive equipment instruction, Pain management, Community reintegration, Functional electrical stimulation, Patient/family education, UE/LE Coordination activities, Therapeutic Activities, Visual/perceptual remediation/compensation, Psychosocial support, Functional mobility training, Discharge planning  SLP Interventions    TR Interventions    SW/CM Interventions Discharge Planning, Psychosocial Support, Patient/Family Education   Barriers to Discharge MD  Medical stability  Nursing Decreased caregiver support, Incontinence ALF PTA w RW  PT Insurance for SNF coverage, Weight, Nutrition means, New oxygen    OT Decreased caregiver support, Incontinence, Lack of/limited family support, Insurance for SNF coverage, Weight, New oxygen    SLP      SW       Team Discharge Planning: Destination: PT-Assisted Living ,OT-   , SLP-  Projected Follow-up: PT-Home health PT, OT-  Home health OT, SLP-  Projected Equipment Needs: PT- , OT- To be determined, SLP-  Equipment Details: PT- , OT-  Patient/family involved in discharge planning:  PT- Patient, Family member/caregiver,  OT-Patient, Family member/caregiver, SLP-   MD ELOS: 21-28d Medical Rehab Prognosis:  Good Assessment:  81 year old female with history of HTN, PAF-on  eliquis, anxiety/depression,scoliosis who was admitted via [facility] on 07/06/21 after fall with difficulty speaking and left sided weakness at her ALF. CT head showed Right thalamic ICH with multiple soft tissue contusions about scalp vertex and 1.6 cm nodular lesion at right temporal scalp. Bleed felt to be hypertensive in setting of eliquis and she was started on Cleveprex as SPB > 220 and eliquis reversed with Andexxa.  She was also found to have E coli UTI  and started on macrodantin-->ceftriaxone-->keflex for treatment. She was transferred to [facility] for management. CTA Head/neck showed no change in thalamic hemorrhage with mild surrounding edema, no LVO or AVM.  2D echo showed EF 60-65% with grade 1 DD.      She developed SOB with hypoxia on 01/20 and CXR done revealing chronic hypoventilation LLL with question of increase in left pleural effusion. CTA chest done and was negative for PE, cosolidative opacities LLL likely atelectasis without pleural effusion and incidental finding of enlarged aortic arch-3 cm with recs for yearly CTA or MRA for monitoring. Dr. Pearlean Brownie recommends holding [facility] till appropriate to resume. Confusion resolving with improvement in activity and cleviprex weaned off with recs for SBP< 160. Marland Kitchen Klonopin resumed to help manage anxiety/sleep wake disruption. Has been weaned down to 2 L per Ragan.   She developed abdominal distension with diarrhea and KUB showed some concerns of gastric obstruction. Dr. Dwain Sarna consulted and no work up indicated as patient without pain or N/V and benign exam. She continues to have left hemiplegia with sensory deficits, left body inattention,  worsening of baseline cognitive deficits with delay in processing and recall- SLUMS score 12/30 and working on balance at EOB. CIR  recommended due to functional decline.       See Team Conference Notes for weekly updates to the plan of care

## 2021-07-18 NOTE — Progress Notes (Signed)
Physical Therapy Session Note  Patient Details  Name: Michelle Bauer MRN: [id number] Date of Birth: 08-13-1940  Today's Date: 07/18/2021 PT Individual Time: 0800-0900 PT Individual Time Calculation (min): 60 min  Co-Treatment Time: [id number] Co-Treatment Time Calculation (min): 20 min  Short Term Goals: Week 1:  PT Short Term Goal 1 (Week 1): Pt will improve trunk strength and sitting balance in order to sit unsupported with MinA for 2 minutes. PT Short Term Goal 2 (Week 1): Pt will perform sit<>supine with MaxA +1. PT Short Term Goal 3 (Week 1): Pt will perform sit<>stand with pull-to-stand technique with consistent MinA. PT Short Term Goal 4 (Week 1): Pt will maintain static standing balance with LRAD and MaxA +1. PT Short Term Goal 5 (Week 1): Pt will perform stand pivot transfer with ModA +2  Skilled Therapeutic Interventions/Progress Updates:     Patient in bed upon PT arrival. Patient alert and agreeable to PT session. Patient denied pain during session.  Patient presents with delayed initiation and processing, L hemiplegia and hemiparesis with significant L inattention, mild-moderate contraversive pushing to the L hemi side, demonstrates L flexor synergy in upper extremity and L extensor synergy in lower extremity in standing only. She has 2/5 L finger extension and 2/5 L elbow flexion/extension, otherwise 0/5 volitional muscle activation in the upper extremity and 0/5 throughout for volitional muscle activation in the lower extremity, however demonstrated automatic activation with functional standing. She has a significant R scoliosis with rotation and R pelvic obliquity, which is baseline per patient, but is presently exaggerated with L trunk weakness due to hemiplegia. Patient followed simple one-step commands consistently throughout session and maintained arousal, however demonstrated significant deficits in activity tolerance requiring multiple lying or seated rests breaks and 2  L/min O2 to maintain SPO2 >90% throughout session.   Therapeutic Activity: Bed Mobility: Patient performed rolling R/L x4 with min A to the L with use of bed rail and max A to the R. Provided manual facilitation and cues for initiation, L attention, and sequencing. Removed soiled incontinence brief, performed peri-care, donned incontinence brief and pulled up pants with total A while rolling. She performed supine to sit x2 with mod-max A progressing through R side-lying with multimodal cues and sit to supine with max A for trunk and lower extremity management.  Transfers: Patient performed sit to/from stand x2 with max A of 1 x1 and max A +2 x1. Provided verbal cues for initiation, forward weight shift, and hip/knee/trunk extension. LPN placed suppository with patient in standing during NMR. Lower body clothing management performed with total A by LPN to allow patient to focus on NMR activities.  Neuromuscular Re-ed: Patient performed the following motor control and balance activities: -sitting balance and midline orientation sitting EOB >7 min focused on reduced pushing with R hand placed on knee, visual target for R shoulder to correct L lean, facilitation to thoracic spine and chest to reduce trunk flexion, as able due to scoliosis, and sustaining posture in midline with visual cues -dynamic sitting balance lifting lower extremities to thread he legs into her pants with total A for dressing task and management of L lower extremity, no assist needed on R, provided multi-modal cues for reduced retropulsion with lifting lower extremities -static standing 2x30-60 sec focused on midline orientation and hip/trunk extension as able, provided max multi-modal cues for R trunk and weight shift, R hand placement cross body to reduce pushing, and increased gluteal and core activation for erect posture.  -L upper extremity  finger flexion/extension, elbow flexion/extension, shoulder abduction and flexion to 90 deg  with PROM and constant cuing for attention to upper extremity  Patient in bed with L upper extremity elevated at end of session with breaks locked, bed alarm set, and all needs within reach.   [id number]: Co-Treatment with Katharine Look, [place] Patient in bed with Katharine Look, OT and LPN in the room performing peri-care upon PT arrival. Patient's daughter present throughout session. Patient alert and agreeable to PT initiating co-treat with OT for skilled +2 assist to promote increased facilitation for improved postural control during ADLs and transfers. Also focused on improved sitting posture in TIS w/c at end of session. Patient denied pain during session.  Therapeutic Activity: Bed Mobility: Patient performed supine to sit with mod-max A +2 facilitating progressing through R side-lying, use of R upper extremity to push up to sitting, and max cuing for awareness of L hemi-body.  Provided posterior facilitation for anterior pelvic tilt, L trunk elongation with facilitation/approximation at L ASIS to reduce pelvic obliquity, R visual target to midline orientation, and total A support for intermittent rest breaks during upper body bathing/dressing, see OT note for details of ADLs. Transfers: Patient performed sit to/from stand x1 with mod A +2 with B HHA and facilitation for B hip extension, progressed to pivot to TIS w/c with max A +2 for weight shift and total A for L limb advancement to step. Provided verbal cues for initiation and sequencing throughout.  Seating and positioning: TIS tilted to 25 deg to maintain pelvis seated back in the chair, headrest adjusted to accommodate head support with towel roll, provided a pillow behind L trunk to accommodate R spinal curve, pillow under L axilla and trunk to reduce L trunk lean, trial of L lap tray with poor positioning that improved with use of L arm trough for upper extremity support and shoulder approximation, towel placed over trough to maintain patient's skin  integrity. Patient with improved, but continued L trunk lean and mild rotation, however trunk supported and positioned to patient comfort at this time.   Initiated discussion of expected need for a custom w/c at d/c. Informed patient that exact needs would be determined closer to d/c, however, it is likely she will need a w/c for at least community level mobility based on her current deficits and level of function. Patient will need further education and emotional support to adjust to functional impairments and loss of independence.   Patient in [place] w/c at end of session with breaks locked, seat belt alarm set, and all needs within reach. LPN agreed to assess patient in 15 min to determine patient safety and need for adjustment in TIS w/c.    Therapy Documentation Precautions:  Precautions Precautions: Fall Precaution Comments: Dense L hemi with absent sensation, severe scoliosis, 2L 02, possible L shoulder sublux Restrictions Weight Bearing Restrictions: Yes    Therapy/Group: Individual Therapy  Avonne Berkery L Tayden Nichelson PT, DPT  07/18/2021, 12:32 PM

## 2021-07-18 NOTE — Care Management (Signed)
[facility] Individual Statement of Services  Patient Name:  Michelle Bauer  Date:  07/18/2021  Welcome to the [facility].  Our goal is to provide you with an individualized program based on your diagnosis and situation, designed to meet your specific needs.  With this comprehensive rehabilitation program, you will be expected to participate in at least 3 hours of rehabilitation therapies Monday-Friday, with modified therapy programming on the weekends.  Your rehabilitation program will include the following services:  Physical Therapy (PT), Occupational Therapy (OT), 24 hour per day rehabilitation nursing, Therapeutic Recreaction (TR), Psychology, Neuropsychology, Care Coordinator, Rehabilitation Medicine, [place], and Other  Weekly team conferences will be held on Tuesdays to discuss your progress.  Your Inpatient Rehabilitation Care Coordinator will talk with you frequently to get your input and to update you on team discussions.  Team conferences with you and your family in attendance may also be held.  Expected length of stay: 21-28 days    Overall anticipated outcome: Minimal Assistance  Depending on your progress and recovery, your program may change. Your Inpatient Rehabilitation Care Coordinator will coordinate services and will keep you informed of any changes. Your Inpatient Rehabilitation Care Coordinator's name and contact numbers are listed  below.  The following services may also be recommended but are not provided by the [facility] will be made to provide these services after discharge if needed.  Arrangements include referral to agencies that provide these services.  Your insurance has been verified to be:  Medicare A/B  Your primary doctor is:   Frazier Richards  Pertinent information will be shared with your doctor and your insurance company.  Inpatient Rehabilitation Care Coordinator:  Cathleen Corti [phone] or [phone]  Information discussed with and copy given to patient by: Rana Snare, 07/18/2021, 9:14 AM

## 2021-07-18 NOTE — Progress Notes (Signed)
PROGRESS NOTE   Subjective/Complaints:  No pain c/os , [name] is irritating nostrils , mouth breathing, states that she gets short winded   ROS: Limited due to cognitive/behavioral    Objective:   No results found. Recent Labs    07/18/21 0601  WBC 5.9  HGB 10.2*  HCT 33.2*  PLT 255   Recent Labs    07/18/21 0601  NA 139  K 4.0  CL 104  CO2 27  GLUCOSE 102*  BUN 23  CREATININE 0.97  CALCIUM 8.6*   No intake or output data in the 24 hours ending 07/18/21 0907       Physical Exam: Vital Signs Blood pressure (!) 171/85, pulse 61, temperature 97.7 F (36.5 C), temperature source Oral, resp. rate 16, height 5\' 3"  (1.6 m), weight 71.8 kg, SpO2 94 %.  General: No acute distress Mood and affect are appropriate Heart: Regular rate and rhythm no rubs murmurs or extra sounds Lungs: Clear to auscultation, breathing unlabored, no rales or wheezes Abdomen: Positive bowel sounds, soft nontender to palpation, nondistended Extremities: No clubbing, cyanosis, or edema Skin: No evidence of breakdown, no evidence of rash   Neuro:  pt slow, delayed. Speech slurred, limited insight and awareness. Did follow simple commands. LUE 0/5. LLE 0/5. Moves right side spontaneously. Decreased left sided sensation and left inattention Musculoskeletal: subluxation left shoulder    Assessment/Plan: 1. Functional deficits which require 3+ hours per day of interdisciplinary therapy in a comprehensive inpatient rehab setting. Physiatrist is providing close team supervision and 24 hour management of active medical problems listed below. Physiatrist and rehab team continue to assess barriers to discharge/monitor patient progress toward functional and medical goals  Care Tool:  Bathing    Body parts bathed by patient: Chest, Abdomen   Body parts bathed by helper: Right arm, Left arm, Front perineal area, Buttocks, Right upper leg, Left  upper leg, Right lower leg, Left lower leg, Face     Bathing assist Assist Level: Maximal Assistance - Patient 24 - 49%     Upper Body Dressing/Undressing Upper body dressing   What is the patient wearing?: Pull over shirt    Upper body assist Assist Level: Maximal Assistance - Patient 25 - 49%    Lower Body Dressing/Undressing Lower body dressing      What is the patient wearing?: Incontinence brief, Pants     Lower body assist Assist for lower body dressing: Dependent - Patient 0%     Toileting Toileting    Toileting assist Assist for toileting: Dependent - Patient 0%     Transfers Chair/bed transfer  Transfers assist  Chair/bed transfer activity did not occur: Safety/medical concerns        Locomotion Ambulation   Ambulation assist   Ambulation activity did not occur: Safety/medical concerns          Walk 10 feet activity   Assist  Walk 10 feet activity did not occur: Safety/medical concerns        Walk 50 feet activity   Assist Walk 50 feet with 2 turns activity did not occur: Safety/medical concerns         Walk 150 feet activity  Assist Walk 150 feet activity did not occur: Safety/medical concerns         Walk 10 feet on uneven surface  activity   Assist Walk 10 feet on uneven surfaces activity did not occur: Safety/medical concerns         Wheelchair     Assist Is the patient using a wheelchair?: No Type of Wheelchair: Manual Wheelchair activity did not occur: Safety/medical concerns         Wheelchair 50 feet with 2 turns activity    Assist    Wheelchair 50 feet with 2 turns activity did not occur: Safety/medical concerns       Wheelchair 150 feet activity     Assist  Wheelchair 150 feet activity did not occur: Safety/medical concerns   Assist Level: 2 helpers   Blood pressure (!) 171/85, pulse 61, temperature 97.7 F (36.5 C), temperature source Oral, resp. rate 16, height 5\' 3"  (1.6  m), weight 71.8 kg, SpO2 94 %.  Medical Problem List and Plan: 1. Functional deficits secondary to right thalamic ICH, 07/06/21             -patient may shower             -ELOS/Goals: 14-18 days S-MinA          -Continue CIR therapies including PT, OT, and SLP   -keep left shoulder elevated on pillow, lap tray to limit subluxation 2.  Antithrombotics: -DVT/anticoagulation:  Pharmaceutical: Lovenox             -antiplatelet therapy: N/a due to thalamic bleed.  3. Pain Management: Tylenol or tramadol prn.  4. Insomnia: continue melatonin 3mg  HS for now             -antipsychotic agents: N/A 5. Neuropsych: This patient is not capable of making decisions on her own behalf. 6. Skin/Wound Care: Routine pressure relief measures.  7. Fluids/Electrolytes/Nutrition: Monitor I/O. Check CMET monday             --offering supplements between meals due to low calorie malnutrition (TP-5.6/alb-2.1), family can help too  -1/29 ate little yesterday but did a fairly good job on breakfast today   -check prealbumin tomorrow 8. A fib: Monitor HR TID--continue amiodarone and metoprolol.  rate control is good  --Off Eliquis till follow up with neurology.  9. HTN: Monitor BP TID--SBP goal, 160. On metoprolol 25 mg bid with amiodarone 200 mg/day. Continue amlodipine 2.5mg .    Vitals:   07/17/21 2006 07/18/21 0513  BP: (!) 127/54 (!) 171/85  Pulse: 67 61  Resp: 14 16  Temp: 98.6 F (37 C) 97.7 F (36.5 C)  SpO2: 94% 94%   Labile , HR in range  10. Hypoxia: Continue 2 L per [place] and wean oxygen as able. Satting well on 1 L and mouth breathing , poor insp effort but lungs are otherwise clear , check portable CXR              --encourage IS/Flutter valve.  11. E coli UTI: Completed 5 day course of antibiotic on 01/22.  12. GAD/depression: Manage with Lexapro and Klonopin BID (took QID at home?) per Dr. Elder Love.             - sleep wake chart.   -daughter says klonopin works well for her   -need to be careful  not to oversedate    13. ABLA: Recheck CBC Hgb down to 10.2 no overt signs of bleeding check stool guaic  14. Overweight:  BMI 28.04- provide dietary education.      LOS: 3 days A FACE TO FACE EVALUATION WAS PERFORMED  Charlett Blake 07/18/2021, 9:07 AM

## 2021-07-18 NOTE — Progress Notes (Addendum)
No longer has scab. Skin intact, skin integrity is normal to ethnic group. 1 out of 2 lacerations healed.   Tilden Dome, LPN

## 2021-07-18 NOTE — Progress Notes (Signed)
Occupational Therapy Session Note  Patient Details  Name: Michelle Bauer MRN: [id number] Date of Birth: June 24, 1940   Session 1: Today's Date: 07/18/2021 OT Individual Time: 1100-1145 OT Individual Time Calculation (min): 45 min   Session 2:   Today's Date: 07/18/2021 OT Individual Time: 1300-1407 OT Individual Time Calculation (min): 67 min    Short Term Goals: Week 1:  OT Short Term Goal 1 (Week 1): Pt will complete UB ADL EOB with +2 assist to work on sitting balance OT Short Term Goal 2 (Week 1): Pt will complete 1 grooming task with Mod A using compensatory strategies as needed OT Short Term Goal 3 (Week 1): Pt will complete toilet transfer with 2 assist and LRAD to promote OOB toileting  Skilled Therapeutic Interventions/Progress Updates:    Session 1: Pt received supine with no c/o pain, agreeable to OT session. Pt completed rolling R with mod A and L with max A. Total A for peri care and brief management in sidelying. Max A+2 for sidelying to sitting EOB. Improved sitting balance compared to earlier AM session per PT. Occasional pushing to the L with baseline scoliosis making midline orientation challenging. Frequent cueing for upright posture and midline orientation. Pt completed UB bathing with min A, with max for [facility] facilitation for the LUE inclusion and sitting balance posterior. She required max cueing for hemi techniques. Pt completed sit > stand from EOB with max A +2. Max A+2 for stand pivot transfer to the TIS w/c. Increased time and effort by OT and PT present to work on positioning in the w/c. Assessed LUE movement- active finger flexion/extension, <25% of full composite, gravity eliminated bicep activation and tricep, trace in shoulder. LUE supported in arm trough. She was left sitting up in the w/c with all needs met, chair alarm set. Dtr [name] present throughout session and supportive.    Session 2:  Pt received in TIS w/c with no c/o pain, reporting chair was  tolerable. Discussed self image and adaption to new abilities as pt admitting this is bothering her, with support from her daughter [name]. Pt was brought via w/c to the therapy gym. On 2L O2 throughout session. Pt was brought into the // bars where remainder of session worked on sit <>stand with focus on midline orientation with head and spine (as much as physically possible with severe scoliosis), L LE weightbearing with step through/back, and glute/hamstring activation. Pt required mod A for sit > stand initially and then anywhere from min-max A to bring trunk into midline with heavy L lean at times with head turn. Heavy facilitation required to provide weightbearing through the LUE and to support [place] joint. Pt returned to her room and completed a stand pivot transfer with max A back to bed. She was left supine with all needs met, bed alarm set.    Therapy Documentation Precautions:  Precautions Precautions: Fall Precaution Comments: Dense L hemi with absent sensation, severe scoliosis, 2L 02, possible L shoulder sublux Restrictions Weight Bearing Restrictions: Yes  Therapy/Group: Individual Therapy  Curtis Sites 07/18/2021, 6:28 AM

## 2021-07-18 NOTE — Progress Notes (Signed)
Patient received flutter valve.  Yehuda Mao, LPN

## 2021-07-18 NOTE — Progress Notes (Signed)
Physical Therapy Session Note  Patient Details  Name: Michelle Bauer MRN: [id number] Date of Birth: 1940-10-01  Today's Date: 07/18/2021 PT Individual Time: 1000-1030 PT Individual Time Calculation (min): 30 min   Short Term Goals: Week 1:  PT Short Term Goal 1 (Week 1): Pt will improve trunk strength and sitting balance in order to sit unsupported with MinA for 2 minutes. PT Short Term Goal 2 (Week 1): Pt will perform sit<>supine with MaxA +1. PT Short Term Goal 3 (Week 1): Pt will perform sit<>stand with pull-to-stand technique with consistent MinA. PT Short Term Goal 4 (Week 1): Pt will maintain static standing balance with LRAD and MaxA +1. PT Short Term Goal 5 (Week 1): Pt will perform stand pivot transfer with ModA +2 Week 2:    Week 3:     Skilled Therapeutic Interventions/Progress Updates:    Pt initially supine w/daughter at bedside.  Says "Hi Ayauna Mcnay" and agreeable to session. Pt rolls L/R and bridging w/therapist positioning LLE, max cues for sequencing., total assist to raise pants.   R side to sit w/max assist.   Worked on sitting balance on edge of bed, mild tendency to push w/RUE. Multidirectional balance instability and heavilty flexed posture in sitting.  Worked on midline orientation, side to/sit to R elbow, visual scanning to upright/to L/cervical rotation and extension, reaching to R, static sit while brushing hair/cues to attend to L side of head. Mod to max assist for balance.  Worked on boosting/scooting w/max to total assist to R side. Pt able to tolerate 15 min sitting balance activities. Cues and max assist for sit to side on elbow to supine.   Pt repositioned in R sidelying w/support.  Therapy Documentation Precautions:  Precautions Precautions: Fall Precaution Comments: Dense L hemi with absent sensation, severe scoliosis, 2L 02, possible L shoulder sublux Restrictions Weight Bearing Restrictions: Yes General:   Vital Signs:  P Therapy/Group:  Individual Therapy Rada Hay, PT  Shearon Balo 07/18/2021, 12:56 PM

## 2021-07-19 NOTE — Patient Care Conference (Signed)
Inpatient RehabilitationTeam Conference and Plan of Care Update Date: 07/19/2021   Time: 10:19 AM    Patient Name: Michelle Bauer      Medical Record Number: [id number]  Date of Birth: 03-11-1941 Sex: Female         Room/Bed: 5C20C/5C20C-01 Payor Info: Payor: MEDICARE / Plan: MEDICARE PART A AND B / Product Type: *No Product type* /    Admit Date/Time:  07/15/2021  3:55 PM  Primary Diagnosis:  ICH (intracerebral hemorrhage) [facility])  Hospital Problems: Principal Problem:   ICH (intracerebral hemorrhage) [facility])    Expected Discharge Date: Expected Discharge Date: 08/10/21  Team Members Present: Physician leading conference: Dr. Faith Rogue Social Worker Present: Cecile Sheerer, LCSWA Nurse Present: Kennyth Arnold, RN PT Present: Casimiro Needle, PT OT Present: Jake Shark, OT PPS Coordinator present : Fae Pippin, SLP     Current Status/Progress Goal Weekly Team Focus  Bowel/Bladder   Incontinent of B/B; LBM: 01/30  Gain continences of B/B  Assist with toileting needs prn   Swallow/Nutrition/ Hydration             ADL's   Max +2 for bed mobility and transfers. Severe scoliosis baseline makes midline orientation even harder. Moderate pushing to the L. LUE has trace activation in the shoulder, <25% of finger flex/ext composite and gravity eliminated bicep/tricep activation  min A overall  ADL retraining, sitting/standing balance, LUE NMR, transfers, family edu   Mobility   mod-max +2 overall, unable to initiate gait, significant R curve scoliosis now exagerated by L trunk weakness, L upper and lower extremity with spontaneous activation in synergistic patterns (UE flexor pattern, LE extensor pattern in standing)  Min A overall  balance, postural control, midline orientation, functional mobility, gait training, activity tolerance, sitting tolerance and positioning, patient/caregiver education   Communication             Safety/Cognition/ Behavioral Observations             Pain   no c/o pain  remain pain free  assess pain QS and prn   Skin   non-pressure wound on R of face  remain free of new skin breakdown/infection  assess skin QS and prn     Discharge Planning:  D/c location pending level of care needs: ALF with private aide and [facility] therapies vs SNF.   Team Discussion: Right Thalamic hemmorhage d/t hypertension. Soft tissue contusion, with cognitive and language deficits. Left side hemiparesis. Pre-Albumin low. Incontinent bowel/bladder. Distended abdomen. Non-pressure wounds to face healing. Chest x-ray completed yesterday. Weaning oxygen. Educate family on level of care will need at discharge. Private aide agency provided by family. Family reports patient is at baseline.   Patient on target to meet rehab goals: Min assist at [facility] level. Currently max assist +2 bed mobility and transfers. Severe Scoliosis, pushes to left. Has full gravity tricep/bicep. Will begin ESTEM. Mod/max assist. Unable to perform motor planning skills and can't process how to roll to the right. Heavy max to stand. Does have some active movement in left side.  *See Care Plan and progress notes for long and short-term goals.   Revisions to Treatment Plan:  Wean oxygen   Teaching Needs: Family education, medication management, bowel/bladder management, skin/wound care, safety awareness, transfer training, etc.  Current Barriers to Discharge: Decreased caregiver support, Incontinence, Wound care, Lack of/limited family support, Weight, Medication compliance, and Nutritional means  Possible Resolutions to Barriers: Family education Order recommended DME Provide nutritional supplements      Medical  Summary Current Status: right thalamic hemorrhage. left hemiparesis, pneumonia improved. still on oxygen but weaning. nutritional status poor--ate better yesterday     Possible Resolutions to [employer] Focus: daily assessment of nutrition, bp, pulmonary status, pt  vs/data   Continued Need for Acute Rehabilitation Level of Care: The patient requires daily medical management by a physician with specialized training in physical medicine and rehabilitation for the following reasons: Direction of a multidisciplinary physical rehabilitation program to maximize functional independence : Yes Medical management of patient stability for increased activity during participation in an intensive rehabilitation regime.: Yes Analysis of laboratory values and/or radiology reports with any subsequent need for medication adjustment and/or medical intervention. : Yes   I attest that I was present, lead the team conference, and concur with the assessment and plan of the team.   Tennis Must 07/19/2021, 3:09 PM

## 2021-07-19 NOTE — Progress Notes (Signed)
Patient ID: Michelle Bauer, female   DOB: [birth year], 81 y.o.   MRN: [id number]  [id number] left message for pt dtr Carollee Herter [phone]) to provide updates from team conference and d/c date 2/22. Reports she intends to discuss with her sister [name] on best plan of care. Likely pt will d/c to SNF.  Cecile Sheerer, MSW, LCSWA Office: [phone] Cell: [phone] Fax: [phone]

## 2021-07-19 NOTE — Progress Notes (Signed)
Physical Therapy Session Note  Patient Details  Name: Michelle Bauer MRN: [id number] Date of Birth: 08/25/1940  Today's Date: 07/19/2021 PT Individual Time: 0807-0903 PT Individual Time Calculation (min): 56 min   Short Term Goals: Week 1:  PT Short Term Goal 1 (Week 1): Pt will improve trunk strength and sitting balance in order to sit unsupported with MinA for 2 minutes. PT Short Term Goal 2 (Week 1): Pt will perform sit<>supine with MaxA +1. PT Short Term Goal 3 (Week 1): Pt will perform sit<>stand with pull-to-stand technique with consistent MinA. PT Short Term Goal 4 (Week 1): Pt will maintain static standing balance with LRAD and MaxA +1. PT Short Term Goal 5 (Week 1): Pt will perform stand pivot transfer with ModA +2  Skilled Therapeutic Interventions/Progress Updates:    Pt received supine in bed, HOB elevated, having just finished breakfast with NT exiting upon therapist arrival. Pt agreeable to therapy session. MD in/out for morning assessment. Pt continues to demo [facility] and delayed and minimal verbal response to questions with increased time for processing. Received and maintained on 2L of O2 via nasal cannula - when pt's posture becomes poor preventing adequate lung expansion during seated balance task EOB her SpO2 decreased as low as 87%; once therapist assisted with improved posture and cued for deeper breaths it recovered to >90%.   Pt noted to be incontinent of bladder. Rolling L min assist using bedrails and rolling R total assist for L hemibody management and cuing for sequencing due to impaired motor planning - total assist LB clothing management and peri-care. Supine>sitting R EOB, HOB flat but using bedrail, with heavy max assist for trunk upright and assist to bring L LE over to EOB.   Functional sitting balance EOB >15minutes while participating in total assist LB clothing task of threading on pants and donning tennis shoes - pt requires frequent ~every 30sec-47minute total  assist to return trunk upright to midline due to progressively worse anterior trunk flexion (pt will fold so far forward her trunk is lying in her lap) - does have significantly delayed awareness of L anterior LOB but will attempt to grab bedrail with R UE to prevent total LOB but not sufficient to maintain upright without max/total assist for recovery - pt also demos pusher tendencies with R UE resulting in worsening L lean upon therapist attempts at facilitating upright midline posture - educated pt on "safe posture" of R lateral trunk lean on forearm support which pt can maintain with close supervision for ~40 seconds prior to starting to have worsening anterior trunk lean/flexion.   Sit>stand from EOB with heavy max assist of 1 for lifting to rise and blocking L knee - pt continues to demo pusher tendencies with strong L lean and pt lying her trunk and head against therapist on the L side for total assist trunk support and unable to correct despite multimodal cuing - standing with heavy max assist pulled pants up over hips with max/total assist as pt attempts to assist with R side. Performed 3 additional sit>stands with attempting use of RW on 1 but pt demos worsening pushing with R UE and more impaired motor planning resulting in more forward flexed trunk posturing as compared to without AD.   L lateral scoot EOB>TIS w/c with total assist for slide board placement (pt using R lateral trunk lean onto forearm to maintain sitting balance with only min assist) and heavy mod assist for lifting/pivoting hips and scooting with pt demoing good motor  planning and activation to assist with lifting hips.   Pt reports "I'm ashamed of myself" - therapist provides emotional support and encouragement on her CLOF and continued participation with therapy.  Pt left seated tilted back in TIS w/c with needs in reach, seat belt alarm on, L UE supported on arm trough, and lines intact.   Therapy Documentation Precautions:   Precautions Precautions: Fall Precaution Comments: Dense L hemi with absent sensation, severe scoliosis, 2L 02, possible L shoulder sublux Restrictions Weight Bearing Restrictions: No   Pain: No reports or indications of pain throughout session.    Therapy/Group: Individual Therapy  Ginny Forth , PT, DPT, NCS, CSRS  07/19/2021, 7:42 AM

## 2021-07-19 NOTE — Progress Notes (Signed)
Occupational Therapy Session Note  Patient Details  Name: Michelle Bauer MRN: [id number] Date of Birth: [birth year]  Session 1: Today's Date: 07/19/2021 OT Individual Time: 1033-1100 OT Individual Time Calculation (min): 27 min    Session 2: Today's Date: 07/19/2021 OT Individual Time: 1300-1400 OT Individual Time Calculation (min): 60 min    Short Term Goals: Week 1:  OT Short Term Goal 1 (Week 1): Pt will complete UB ADL EOB with +2 assist to work on sitting balance OT Short Term Goal 2 (Week 1): Pt will complete 1 grooming task with Mod A using compensatory strategies as needed OT Short Term Goal 3 (Week 1): Pt will complete toilet transfer with 2 assist and LRAD to promote OOB toileting  Skilled Therapeutic Interventions/Progress Updates:    Session 1: Pt received sitting in the TIS w/c with no c/o pain, dtr Michelle Bauer present. She completed sit > stand at the sink with mod-max A, dtr acting as +2, providing min A. Facilitated LUE weightbearing through the sink as well as L knee blocking with max-total A. Worked on midline orientation with exaggerated R lean to combat L pushing. Pt was able to complete oral care in standing with max A for standing balance. Pt completed a stand pivot transfer back to the bed with max +2 assist. Pt was left sidelying with all needs met, bed alarm set.    Session 2:  Pt received supine with no c/o pain agreeable to OT session. Brief change at bed level d/t incontinent urine with total A. Pt completed bed mobility to EOB with max facilitation for motor planning knees to chest and pushing from the elbow to come sitting EOB- max A overall. Fluctuating min-max A sitting balance as pt would begin pushing to the R and losing trunk control, flexing forward with trunk and head. Pt completed a stand pivot transfer with max +2 assist to the TIS w/c and then was taken to the therapy gym total A. From EOM, worked on sitting balance with focus on midline orientation with  head/neck and trunk. Worked on several exercises to increase trunk righting reactions coming laterally and then to center, as well as head-hips relationship facilitation. Used the eva walker to work on BUE weightbearing and standing balance- with max facilitation provided at the L knee for quad activation. Mirror used anteriorly to provide visual feedback throughout session. Pt stable on 1-2L O2 throughout session. Pt returned to her w/c and to her room. She was left sitting up with all needs met, daughter Michelle Bauer present.   [place] Stim One applied to pt's L anterior and middle deltoid for joint approximation and muscle activation. Pt with no adverse skin reaction or reports of pain before/after tx. 60 min unattended.  330 pulse width 35 Hz pulse rate On 8 sec/ off 8 sec Ramp up/ down 2 sec Symmetrical Biphasic wave form  Max intensity 161m at 500 Ohm load    Therapy Documentation Precautions:  Precautions Precautions: Fall Precaution Comments: Dense L hemi with absent sensation, severe scoliosis, 2L 02, possible L shoulder sublux Restrictions Weight Bearing Restrictions: No  Therapy/Group: Individual Therapy  SCurtis Sites1/31/2023, 6:31 AM

## 2021-07-19 NOTE — Progress Notes (Signed)
PROGRESS NOTE   Subjective/Complaints:  Pt up in bed. About to get started with therapy. No new problems. Very alert. Seems to have slept  ROS: Limited due to cognitive/behavioral/language   Objective:   DG Chest Port 1 View  Result Date: 07/18/2021 CLINICAL DATA:  Hypoxia. EXAM: PORTABLE CHEST 1 VIEW COMPARISON:  07/08/2021 portable chest and chest CTA FINDINGS: Stable severe dextroconvex thoracic scoliosis. Stable enlarged cardiac silhouette and post CABG changes. Stable prominent epicardial fat pad on the left. Clear lungs with normal vascularity. Cholecystectomy clips. IMPRESSION: No acute abnormality. Electronically Signed   By: Claudie Revering M.D.   On: 07/18/2021 09:57   Recent Labs    07/18/21 0601  WBC 5.9  HGB 10.2*  HCT 33.2*  PLT 255   Recent Labs    07/18/21 0601  NA 139  K 4.0  CL 104  CO2 27  GLUCOSE 102*  BUN 23  CREATININE 0.97  CALCIUM 8.6*    Intake/Output Summary (Last 24 hours) at 07/19/2021 0906 Last data filed at 07/19/2021 [id number] Gross per 24 hour  Intake 358 ml  Output --  Net 358 ml         Physical Exam: Vital Signs Blood pressure (!) 155/67, pulse 62, temperature 98.1 F (36.7 C), resp. rate 14, height 5\' 3"  (1.6 m), weight 71.8 kg, SpO2 90 %.  Constitutional: No distress . Vital signs reviewed. HEENT: NCAT, EOMI, oral membranes moist Neck: supple Cardiovascular: RRR without murmur. No JVD    Respiratory/Chest: CTA Bilaterally without wheezes or rales. Normal effort    GI/Abdomen: BS +, non-tender, non-distended Ext: no clubbing, cyanosis, or edema Psych: pleasant and cooperative  Skin: No evidence of breakdown, no evidence of rash Neuro:  delayed speech, processing. . Did follow simple commands. LUE 0/5. LLE 0/5. Moves right side spontaneously. Decreased left sided sensation and left inattention but does react to pain in left arm and leg Musculoskeletal: minimal subluxation  left shoulder    Assessment/Plan: 1. Functional deficits which require 3+ hours per day of interdisciplinary therapy in a comprehensive inpatient rehab setting. Physiatrist is providing close team supervision and 24 hour management of active medical problems listed below. Physiatrist and rehab team continue to assess barriers to discharge/monitor patient progress toward functional and medical goals  Care Tool:  Bathing    Body parts bathed by patient: Chest, Abdomen   Body parts bathed by helper: Right arm, Left arm, Front perineal area, Buttocks, Right upper leg, Left upper leg, Right lower leg, Left lower leg, Face     Bathing assist Assist Level: Maximal Assistance - Patient 24 - 49%     Upper Body Dressing/Undressing Upper body dressing   What is the patient wearing?: Pull over shirt    Upper body assist Assist Level: Maximal Assistance - Patient 25 - 49%    Lower Body Dressing/Undressing Lower body dressing      What is the patient wearing?: Incontinence brief, Pants     Lower body assist Assist for lower body dressing: Dependent - Patient 0%     Toileting Toileting    Toileting assist Assist for toileting: Dependent - Patient 0%     Transfers Chair/bed  transfer  Transfers assist  Chair/bed transfer activity did not occur: Safety/medical concerns        Locomotion Ambulation   Ambulation assist   Ambulation activity did not occur: Safety/medical concerns          Walk 10 feet activity   Assist  Walk 10 feet activity did not occur: Safety/medical concerns        Walk 50 feet activity   Assist Walk 50 feet with 2 turns activity did not occur: Safety/medical concerns         Walk 150 feet activity   Assist Walk 150 feet activity did not occur: Safety/medical concerns         Walk 10 feet on uneven surface  activity   Assist Walk 10 feet on uneven surfaces activity did not occur: Safety/medical concerns          Wheelchair     Assist Is the patient using a wheelchair?: No Type of Wheelchair: Manual Wheelchair activity did not occur: Safety/medical concerns         Wheelchair 50 feet with 2 turns activity    Assist    Wheelchair 50 feet with 2 turns activity did not occur: Safety/medical concerns       Wheelchair 150 feet activity     Assist  Wheelchair 150 feet activity did not occur: Safety/medical concerns   Assist Level: 2 helpers   Blood pressure (!) 155/67, pulse 62, temperature 98.1 F (36.7 C), resp. rate 14, height 5\' 3"  (1.6 m), weight 71.8 kg, SpO2 90 %.  Medical Problem List and Plan: 1. 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 Interdisciplinary team conference today to discuss goals, barriers to discharge, and dc planning.     -keeping left shoulder elevated on pillow, lap tray to limit subluxation 2.  Antithrombotics: -DVT/anticoagulation:  Pharmaceutical: Lovenox             -antiplatelet therapy: N/a due to thalamic bleed.  3. Pain Management: Tylenol or tramadol prn.  4. Insomnia: continue melatonin 3mg  HS for now             -antipsychotic agents: N/A 5. Neuropsych: This patient is not capable of making decisions on her own behalf. 6. Skin/Wound Care: Routine pressure relief measures.  7. Fluids/Electrolytes/Nutrition: Monitor I/O. Check CMET monday             --offering supplements between meals due to low calorie malnutrition (TP-5.6/alb-2.1), family can help too   1/31-intake better yesterday   -prealbumin only 16.8 1/30   -continue to push po 8. A fib: Monitor HR TID--continue amiodarone and metoprolol.  rate control is good  --Off Eliquis till follow up with neurology.  9. HTN: Monitor BP TID--SBP goal, 160. On metoprolol 25 mg bid with amiodarone 200 mg/day. Continue amlodipine 2.5mg .    Reasonable control, HR in range  10. Hypoxia: Continue 2 L per [place] and wean oxygen as able.              --encourage IS/Flutter valve, oob   -still needs 1-2 L O2 11. E coli UTI: Completed 5 day course of antibiotic on 01/22.  12. GAD/depression: Manage with Lexapro and Klonopin BID (took QID at home?) per  Dr. Elder Love.             - sleep wake chart.   -daughter says klonopin works well for her   -need to be careful not to oversedate    13. ABLA: Recheck CBC Hgb down to 10.2  -no overt signs of bleeding  -stool guaic pending 14. Overweight: BMI 28.04- provide dietary education.      LOS: 4 days A FACE TO FACE EVALUATION WAS PERFORMED  Meredith Staggers 07/19/2021, 9:06 AM

## 2021-07-20 NOTE — Progress Notes (Signed)
Physical Therapy Session Note  Patient Details  Name: Michelle Bauer MRN: [id number] Date of Birth: 05/09/1941  Today's Date: 07/20/2021 PT Individual Time: 1030-1130 and 1301-1359 PT Individual Time Calculation (min): 60 min and 58 min  Short Term Goals: Week 1:  PT Short Term Goal 1 (Week 1): Pt will improve trunk strength and sitting balance in order to sit unsupported with MinA for 2 minutes. PT Short Term Goal 2 (Week 1): Pt will perform sit<>supine with MaxA +1. PT Short Term Goal 3 (Week 1): Pt will perform sit<>stand with pull-to-stand technique with consistent MinA. PT Short Term Goal 4 (Week 1): Pt will maintain static standing balance with LRAD and MaxA +1. PT Short Term Goal 5 (Week 1): Pt will perform stand pivot transfer with ModA +2  Skilled Therapeutic Interventions/Progress Updates:  Session 1:  Patient seated in TIS on entrance to room. Patient alert and agreeable to PT session. Dtr in room and shows videos of pt's therapy the previous day.    Patient with no pain complaint throughout session.  Pt brought to therapy gym TotA for time.   Therapeutic Activity: Transfers: Patient performed squat pivot transfer from w/c to mat table with ModA +2. Provided vc/tc  for forward lean, foot placement, use of RUE. Pt requires MaxA +2 for return to w/c to R side at end of session.   Neuromuscular Re-ed: NMR facilitated during session with focus on sitting balance, standing balance, sit<>stand training. Pt guided in sitting balance challenge without back support. Pt consistently melting forward, backward, and to L sides. VC throughout to guide pt and ask pt how to correct in order to work on proprioception and ability find midline. With fatigue, pt requires mor physical assist to correct.   Sit<>stand training with +2 for RUE support. Attempt to guard LLE for pt to step RLE with pt unable to lift foot. Corrections with vc provided for upright posture. Lateral weight shifting  performed with initiation from pt.    NMR performed for improvements in motor control and coordination, balance, sequencing, judgement, and self confidence/ efficacy in performing all aspects of mobility at highest level of independence.   Patient seated  in TIS and mildly tilted back  at end of session with brakes locked, belt alarm set, and all needs within reach. Dtr in room.   Session 2: Patient seated in TIS w/c on entrance to room. Patient alert and agreeable to PT session. Noted incontinence and pt transferred to bed for brief change and cleanup prior to session. STEDY used for time in transfer to bed.  Patient with no pain complaint throughout session.  Therapeutic Activity: Bed Mobility: Patient performed sit-->supine requiring MaxA +2 for time. During brief change and pericare, pt guided in segmental log rolling technique for improving ability to roll to R side. Able to follow instructions for ModA to complete. MinA to roll to L side and holds to sidelying with supervision.   Transfers: Patient performed sit<>stand at EOB requiring ModA +2 to reach upright stance. Mild R knee block. Provided vc/tc  for correcting balance/ posture.  Neuromuscular Re-ed: NMR facilitated during session with focus on standing balance and muscle activation. Pt guided in sit<>stand performance and vc/ tc for reaching upright posture. Performance of minisquats improves pt's ability to produce full body extension when pt unable to perform statically with vc. Attempt again for block into extension for LLE and request for step forward in RLE, but pt unable to lift foot additional lean to L.  When looking at pt, increased pushing noted to L. NMR performed for improvements in motor control and coordination, balance, sequencing, judgement, and self confidence/ efficacy in performing all aspects of mobility at highest level of independence.   Patient pillow propped to sidelying on R side  in bed at end of session with  brakes locked, bed alarm set, and all needs within reach. NT notified that pt's dtr has left and to monitor pt for toileting and fluid intake.    Therapy Documentation Precautions:  Precautions Precautions: Fall Precaution Comments: Dense L hemi with absent sensation, severe scoliosis, 2L 02, possible L shoulder sublux Restrictions Weight Bearing Restrictions: No General:   Vital Signs:  Pain: Pain Assessment Pain Scale: 0-10 Pain Score: 0-No pain Mobility:   Locomotion :    Trunk/Postural Assessment :    Balance:   Exercises:   Other Treatments:      Therapy/Group: Individual Therapy  Alger Simons PT, DPT 07/20/2021, 12:57 PM

## 2021-07-20 NOTE — Progress Notes (Signed)
Occupational Therapy Session Note  Patient Details  Name: Michelle Bauer MRN: [id number] Date of Birth: 1940/08/22  Today's Date: 07/20/2021 OT Individual Time: 1427-6701 OT Individual Time Calculation (min): 60 min    Short Term Goals: Week 1:  OT Short Term Goal 1 (Week 1): Pt will complete UB ADL EOB with +2 assist to work on sitting balance OT Short Term Goal 2 (Week 1): Pt will complete 1 grooming task with Mod A using compensatory strategies as needed OT Short Term Goal 3 (Week 1): Pt will complete toilet transfer with 2 assist and LRAD to promote OOB toileting  Skilled Therapeutic Interventions/Progress Updates:    Pt received supine with no c/o pain, resting on 2L O2 via [place], she was agreeable to OT session. Bed mobility with focus on motor planning rolling and management of hemi LUE/LLE. Max A to roll R and min A to roll L. Max A for management/positioning of the LUE. Max A for peri hygiene, pt able to complete anterior but not posterior while supine. Pants donned with max A, pt able to bridge R hip with LLE supported in flexed knee/hip. Pt completed sidelying > sitting EOB transfer with focus placed on exaggerated hip and knee flexion, requiring max A overall. Mod A overall to remain sitting statically. Max A +2 present for safety for stand pivot transfer to the TIS w/c. Blocking of the L knee required. Pt was tilted back in the chair and then assisted with hair washing from the w/c with a tray placed posteriorly. Focus on head midline orientation and trunk extension, both of which were facilitated nicely reclined back. UB bathing and dressing with mod A overall. She was left sitting up with all needs met, chair alarm set.    Sa[place]o Stim One placed on pt's middle and anterior deltoid to facilitate joint approximation and muscle activation. No adverse skin reactions/pain before/after tx. 60 min unattended.  330 pulse width 35 Hz pulse rate On 8 sec/ off 8 sec Ramp up/ down 2  sec Symmetrical Biphasic wave form  Max intensity 116m at 500 Ohm load   Therapy Documentation Precautions:  Precautions Precautions: Fall Precaution Comments: Dense L hemi with absent sensation, severe scoliosis, 2L 02, possible L shoulder sublux Restrictions Weight Bearing Restrictions: No    Therapy/Group: Individual Therapy  SCurtis Sites2/06/2021, 6:20 AM

## 2021-07-20 NOTE — Progress Notes (Signed)
Physical Therapy Session Note  Patient Details  Name: ANTHEA UDOVICH MRN: [id number] Date of Birth: 1940-07-20  Today's Date: 07/20/2021 PT Individual Time:  -      Short Term Goals: Week 1:  PT Short Term Goal 1 (Week 1): Pt will improve trunk strength and sitting balance in order to sit unsupported with MinA for 2 minutes. PT Short Term Goal 2 (Week 1): Pt will perform sit<>supine with MaxA +1. PT Short Term Goal 3 (Week 1): Pt will perform sit<>stand with pull-to-stand technique with consistent MinA. PT Short Term Goal 4 (Week 1): Pt will maintain static standing balance with LRAD and MaxA +1. PT Short Term Goal 5 (Week 1): Pt will perform stand pivot transfer with ModA +2  Skilled Therapeutic Interventions/Progress Updates:  Patient seated in TIS on entrance to room. Patient alert and agreeable to PT session. Agreeable to presence of PT student throughout session.   Patient with no pain complaint throughout session.  Therapeutic Activity: Transfers: Patient performed sit<>stand and stand pivot transfers throughout session with up to ModA +2. Provided verbal cues for technique including forward lean for appropriate anterior weight shift and increased effort in extensor push of whole body.  Neuromuscular Re-ed: NMR facilitated during session with focus on sitting balance, standing balance, proprioception, midline orientation, muscle facilitation. Pt guided in seated balance challenge in reaching for ball held just outside BOS and LUE supported by therapist. Pt  with LOB with reaches to L or R but is able to maintain with direct anterior reach prior to fatigue. Requires vc for using RUE and trunk musculature to correct posture to midline.   Standiing balance/ tolerance challenged with sit<> stand training from w/c. MinA +2 required for first attempt and pt able to reach upright posture with min cueing. ModA +2 required for remaining attempts and requires mod to max cueing with NDT trunk  facilitation as well as BLE activation in order to facilitate more upright hold of UB and head. During standing, Pt's L knee did not require block during first stance , then requires Mod block for remaining standing bouts. Facilitated lateral weight shifting, light lift of feet, then progressed to minisquat performance with pt able to perform and improve posture with rise in extension. NMR performed for improvements in motor control and coordination, balance, sequencing, judgement, and self confidence/ efficacy in performing all aspects of mobility at highest level of independence.    Patient tilted back  in TIS at end of session with brakes locked, belt alarm set, and all needs within reach. NT notified as to pt's positioning and need for int checks.     Therapy Documentation Precautions:  Precautions Precautions: Fall Precaution Comments: Dense L hemi with absent sensation, severe scoliosis, 2L 02, possible L shoulder sublux Restrictions Weight Bearing Restrictions: No General:   Pain:  No pain complaint from pt during session.    Therapy/Group: Individual Therapy  Loel Dubonnet PT, DPT 07/20/2021, 6:59 AM

## 2021-07-21 MED ORDER — STUDY - ASPIRE - APIXABAN 5 MG OR PLACEBO TABLET (PI-SETHI)
5.0000 mg | ORAL_TABLET | Freq: Two times a day (BID) | ORAL | Status: DC
  Administered 2021-07-22 – 2021-08-03 (×25): 5 mg via ORAL
  Filled 2021-07-21 (×23): qty 1

## 2021-07-21 MED ORDER — STUDY - ASPIRE - ASPIRIN 81 MG OR PLACEBO TABLET (PI-SETHI)
81.0000 mg | ORAL_TABLET | Freq: Every day | ORAL | Status: DC
  Administered 2021-07-22 – 2021-08-03 (×13): 81 mg via ORAL
  Filled 2021-07-21 (×14): qty 1

## 2021-07-21 NOTE — Progress Notes (Signed)
Physical Therapy Session Note  Patient Details  Name: Michelle Bauer MRN: [id number] Date of Birth: 08/05/1940  Today's Date: 07/21/2021 PT Individual Time: 1499-6924 PT Individual Time Calculation (min): 69 min   Short Term Goals: Week 1:  PT Short Term Goal 1 (Week 1): Pt will improve trunk strength and sitting balance in order to sit unsupported with MinA for 2 minutes. PT Short Term Goal 2 (Week 1): Pt will perform sit<>supine with MaxA +1. PT Short Term Goal 3 (Week 1): Pt will perform sit<>stand with pull-to-stand technique with consistent MinA. PT Short Term Goal 4 (Week 1): Pt will maintain static standing balance with LRAD and MaxA +1. PT Short Term Goal 5 (Week 1): Pt will perform stand pivot transfer with ModA +2  Skilled Therapeutic Interventions/Progress Updates:    Pt received sitting in WC and agreeable to PT. Stedy transfer with max A of 1 and max cues for midline. Attempted to perform partial stand with erect posture, but pt unable to sustain trunk extension with LE flexion.   Sitting balance min-mod assist to perform lateral reach to the R and return to midline 6x 4 . Sit<>stand x 5 mod assist with RUE supported and LLE blocked. Sustained standing x 3sec each.   Supine>sit with max assist on the R. Supine NMR SAQ, SLR, hip abduction, ankle pumps quad sests. Each performed x 10 each with AAROM on the LLE. Heel cord stretch BLE x 1 min each..   Left supine in bed with call bell in reach with all needs met.       Therapy Documentation Precautions:  Precautions Precautions: Fall Precaution Comments: Dense L hemi with absent sensation, severe scoliosis, 2L 02, possible L shoulder sublux Restrictions Weight Bearing Restrictions: No    Vital Signs: Therapy Vitals Temp: 97.8 F (36.6 C) Pulse Rate: (!) 56 Resp: 17 BP: (!) 158/63 Patient Position (if appropriate): Lying Oxygen Therapy SpO2: 92 % O2 Device: Nasal Cannula O2 Flow Rate (L/min): 2  L/min Pain: denies    Therapy/Group: Individual Therapy  Lorie Phenix 07/21/2021, 6:05 PM

## 2021-07-21 NOTE — Progress Notes (Signed)
 Investigational Drug Service New Start Study: ASPIRE Principal Investigator (PI): Antony Contras, MD Research Group: [facility]  SUMMARY For more information refer to: [email or web address]. 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  Brief Summary ASPIRE is a randomized, double-blinded, phase III clinical trial designed to test the efficacy and safety of anticoagulation, compared with aspirin, in patients with a recent ICH and high-risk non-valvular AF (CHA2DS2-VASc score ? 2). Seven hundred patients will be enrolled over 3.5 years and followed for study outcomes for a minimum of 12 months and maximum of 36 months.  Design Phase 3, randomized, blinded, prevention trial.  Outcomes To determine if Apixaban is superior to Aspirin for prevention of the composite outcome of any stroke (hemorrhagic or ischemic) or death from any cause in patients with recent ICH and AF.    Participant Population Inclusion: >6 yo, ICH confirmed by CT or MRI, can be randomized within 14 - 120 days after ICH onset.  Non-valvular AF, CHA2DS2-VASc score > 2. Exclusion: infective endocarditis, SCr > 2.5 mg/dL, active hepatitis/hepatic insufficiency Child-Pugh score B or C, Hgb <8 g/dL or plt <100k.  Intervention  Apixiaban 5 mg or placebo tab po BID + aspirin 81 mg or placebo tab po daily    Additional Information First dose of study medication should be taking within 48 hours of randomization.   Prohibited Therapy: Study drug must be held if any of the following open-label medications are started: Aspirin, non-aspirin antiplatelet, or anticoagulant therapy due to potential interaction with the study.  Restart study as soon as  the open-label medication has been stopped.  Additional Information: An adjusted dose of Apixaban 2.5/Placebo will be used for subjects with ? 2 of the following are present: age ? 80 years, body weight ? 60 kg, or serum creatinine 1.5- 2.4 mg/dl  or subject is taking a strong CYP3A4 /pGP inhibitor (ketoconazole, itraconazole, ritonavir, or clarithromycin).    Administration: Apixaban/placebo may be crushed for g-tube administration (g-tube feed held for 1 hour before and 1 hour after administration).  At discharge: Please pick up medications from [facility] main pharmacy to be sent home with patient.   Plan: Patient will start apixaban 5 mg/placebo BID and aspirin 81 mg/placebo on 07/22/2021 at 0800. Enoxaparin was discontinued due to it being a prohibited medication for the study.   The Investigational Drug Service will be overseeing the dispensation of the medication. If you have any questions or concerns, please reach out to the Investigational Drug Service at [phone].  Acey Lav, PharmD, [facility] Clinical Pharmacist

## 2021-07-21 NOTE — Progress Notes (Signed)
PROGRESS NOTE   Subjective/Complaints:  Pt in bed. Gave me a loud greeting when I arrived. No obvious issues. Perhaps didn't sleep that well?  ROS: Limited due to cognitive/behavioral    Objective:   No results found. No results for input(s): WBC, HGB, HCT, PLT in the last 72 hours.  No results for input(s): NA, K, CL, CO2, GLUCOSE, BUN, CREATININE, CALCIUM in the last 72 hours.   Intake/Output Summary (Last 24 hours) at 07/21/2021 0925 Last data filed at 07/20/2021 2145 Gross per 24 hour  Intake 340 ml  Output --  Net 340 ml         Physical Exam: Vital Signs Blood pressure (!) 152/65, pulse (!) 58, temperature (!) 97.5 F (36.4 C), resp. rate 19, height 5\' 3"  (1.6 m), weight 71.8 kg, SpO2 91 %.  Constitutional: No distress . Vital signs reviewed. HEENT: NCAT, EOMI, oral membranes moist Neck: supple Cardiovascular: RRR without murmur. No JVD    Respiratory/Chest: CTA Bilaterally without wheezes or rales. Normal effort    GI/Abdomen: BS +, non-tender, non-distended Ext: no clubbing, cyanosis, or edema Psych: pleasant and cooperative  Skin: No evidence of breakdown, no evidence of rash Neuro:  delayed speech, processing. . Did follow simple commands. LUE   LLE tr-1/5 at best. Some early flexor tone developing in LUE and extensor tone in LLE. Moves RUE/RLE 4/5.  Decreased left sided sensation and left inattention.  Musculoskeletal: minimal subluxation left shoulder on exam. Denies pain with ROM of LUE and LLE today    Assessment/Plan: 1. Functional deficits which require 3+ hours per day of interdisciplinary therapy in a comprehensive inpatient rehab setting. Physiatrist is providing close team supervision and 24 hour management of active medical problems listed below. Physiatrist and rehab team continue to assess barriers to discharge/monitor patient progress toward functional and medical goals  Care  Tool:  Bathing    Body parts bathed by patient: Chest, Abdomen   Body parts bathed by helper: Right arm, Left arm, Front perineal area, Buttocks, Right upper leg, Left upper leg, Right lower leg, Left lower leg, Face     Bathing assist Assist Level: Maximal Assistance - Patient 24 - 49%     Upper Body Dressing/Undressing Upper body dressing   What is the patient wearing?: Pull over shirt    Upper body assist Assist Level: Maximal Assistance - Patient 25 - 49%    Lower Body Dressing/Undressing Lower body dressing      What is the patient wearing?: Incontinence brief, Pants     Lower body assist Assist for lower body dressing: Dependent - Patient 0%     Toileting Toileting    Toileting assist Assist for toileting: Dependent - Patient 0%     Transfers Chair/bed transfer  Transfers assist  Chair/bed transfer activity did not occur: Safety/medical concerns  Chair/bed transfer assist level: Maximal Assistance - Patient 25 - 49% Chair/bed transfer assistive device: Sliding board   Locomotion Ambulation   Ambulation assist   Ambulation activity did not occur: Safety/medical concerns          Walk 10 feet activity   Assist  Walk 10 feet activity did not occur: Safety/medical concerns  Walk 50 feet activity   Assist Walk 50 feet with 2 turns activity did not occur: Safety/medical concerns         Walk 150 feet activity   Assist Walk 150 feet activity did not occur: Safety/medical concerns         Walk 10 feet on uneven surface  activity   Assist Walk 10 feet on uneven surfaces activity did not occur: Safety/medical concerns         Wheelchair     Assist Is the patient using a wheelchair?: No Type of Wheelchair: Manual Wheelchair activity did not occur: Safety/medical concerns         Wheelchair 50 feet with 2 turns activity    Assist    Wheelchair 50 feet with 2 turns activity did not occur: Safety/medical  concerns       Wheelchair 150 feet activity     Assist  Wheelchair 150 feet activity did not occur: Safety/medical concerns   Assist Level: 2 helpers   Blood pressure (!) 152/65, pulse (!) 58, temperature (!) 97.5 F (36.4 C), resp. rate 19, height 5\' 3"  (1.6 m), weight 71.8 kg, SpO2 91 %.  Medical Problem List and Plan: 1. 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  Antithrombotics: -DVT/anticoagulation:  Pharmaceutical: Lovenox             -antiplatelet therapy: N/a due to thalamic bleed.  3. Pain Management: Tylenol or tramadol prn.  4. Insomnia: continue melatonin 3mg  HS for now             -antipsychotic agents: N/A 5. Neuropsych: This patient is not capable of making decisions on her own behalf. 6. Skin/Wound Care: Routine pressure relief measures.  7. Fluids/Electrolytes/Nutrition: Monitor I/O. Check CMET monday             --offering supplements between meals due to low calorie malnutrition (TP-5.6/alb-2.1), family can help too   2/2 po intake inconsistent   -prealbumin only 16.8 1/30   -continue to push po   -add megace to boost appetite 8. A fib: Monitor HR TID--continue amiodarone and metoprolol.  rate control is good  --Off Eliquis till follow up with neurology.  9. HTN: Monitor BP TID--SBP goal, 160. On metoprolol 25 mg bid with amiodarone 200 mg/day. Continue amlodipine 2.5mg .   Reasonable control, HR in range  10. Hypoxia: Continue 2 L per Isabel and wean oxygen as able.              --encourage IS/Flutter valve, oob   -still needs 1-2 L O2 11. E coli UTI: Completed 5 day course of antibiotic on 01/22.  12. GAD/depression: Manage with Lexapro and Klonopin BID (took QID at home?) per Dr. Elder Love.             -sleep fair   -daughter  says klonopin works well for her   -need to be careful not to oversedate    13. ABLA: Most recent Hgb down to 10.2  2/2-no overt signs of bleeding  -stool guaic pending--will reorder -recheck CBC tomorrow 14. Overweight: BMI 28.04- provide dietary education.      LOS:  6 days A FACE TO FACE EVALUATION WAS PERFORMED  Meredith Staggers 07/21/2021, 9:25 AM

## 2021-07-21 NOTE — Progress Notes (Signed)
Occupational Therapy Session Note  Patient Details  Name: Michelle Bauer MRN: [id number] Date of Birth: Nov 26, 1940  Today's Date: 07/21/2021 OT Individual Time: 4098-1191 OT Individual Time Calculation (min): 57 min   Short Term Goals: Week 1:  OT Short Term Goal 1 (Week 1): Pt will complete UB ADL EOB with +2 assist to work on sitting balance OT Short Term Goal 2 (Week 1): Pt will complete 1 grooming task with Mod A using compensatory strategies as needed OT Short Term Goal 3 (Week 1): Pt will complete toilet transfer with 2 assist and LRAD to promote OOB toileting  Skilled Therapeutic Interventions/Progress Updates:    Pt greeted seated in TIS wc with daughter present. Pt's daughter reported she thought patient needed to be changed. Stedy used for sit<>stands with max+2 to power up and strong push to the L requiring 1 person to assist with upright standing while OT assisted with total A peri-care and brief change. Utilized mirror feedback to try to bring awareness and posture to midline. Worked on weight shifting R with all opportunities to reach for grooming objects and clothing. Pt able to assist with threading pant legs reaching forward, then stedy used to again to pull up pants. UB bathing/dressing from wc at the sink with focus on hemi-dressing techniques, hand over hand for L UE neuro re-ed. Pt brought to the gym in wc and worked on weight shifting to the R to decrease pusher tendencies by reaching for cones to the R and overhead. NDT techniques to facilitate movement patterns from hips and trunk. Pt returned to room and left seated in TIS wc with alarm belt on, call bell in reach, and daughter present.  Therapy Documentation Precautions:  Precautions Precautions: Fall Precaution Comments: Dense L hemi with absent sensation, severe scoliosis, 2L 02, possible L shoulder sublux Restrictions Weight Bearing Restrictions: No Pain: Pain Assessment Pain Scale: 0-10 Pain Score: 0-No  pain   Therapy/Group: Individual Therapy  Mal Amabile 07/21/2021, 2:53 PM

## 2021-07-21 NOTE — Progress Notes (Signed)
Physical Therapy Session Note  Patient Details  Name: Michelle Bauer MRN: [id number] Date of Birth: 1940-08-03  Today's Date: 07/21/2021 PT Individual Time: 0916-1028 PT Individual Time Calculation (min): 72 min   Short Term Goals: Week 1:  PT Short Term Goal 1 (Week 1): Pt will improve trunk strength and sitting balance in order to sit unsupported with MinA for 2 minutes. PT Short Term Goal 2 (Week 1): Pt will perform sit<>supine with MaxA +1. PT Short Term Goal 3 (Week 1): Pt will perform sit<>stand with pull-to-stand technique with consistent MinA. PT Short Term Goal 4 (Week 1): Pt will maintain static standing balance with LRAD and MaxA +1. PT Short Term Goal 5 (Week 1): Pt will perform stand pivot transfer with ModA +2  Skilled Therapeutic Interventions/Progress Updates:  Pt received supine in bed, on 2L O2, denied pain and was agreeable to PT. Emphasis of session on midline orientation, L NMR and sit <>stands. Pt reported wearing soiled brief and rolled to L side w/CGA and R side w/mod A to perform brief change, peri care and lower body dressing w/total A. Supine <>sit w/max A x2 for BLE management, trunk support and LUE support, mod verbal cues for hand placement and motor planning. Pt required max A for static sitting balance at EOB due to significant posterolateral lean to L, pt able to initiate midline orientation w/max verbal and tactile cues. Transferred pt to 2L portable O2 and pt performed squat pivot from EOB to TIS on R side w/mod A x2, max verbal cues for motor planning and positioning. Max Ax2 for repositioning hips in chair. Once in TIS, pt demonstrated excessive L pushing, requiring max A to avoid falling out of chair. Added towel wedge under L hip to bias lean towards R side and propped LUE on pillow. Pt reclined in TIS until head touched headrest and pt transported to main gym w/total A for time management.   NMR Pt performed 2 sit <>stands w/60-90s hold w/max A x2 for LUE  management, L knee block and trunk support. Noted significant posterolateral lean to L side that pt able to self correct for 3-5 seconds w/max multimodal cues. No buckling of L knee noted, but pt demonstrated poor foot placement bilaterally and due to posterior lean, L foot slid forward. Pt unable to shift weight anteriorly to avoid pushing back on TIS. Stand <>sit w/max A for eccentric control.   Pt transported back to room w/total A and was left seated in reclined TIS, LUE propped on pillows, daughter present and all needs in reach. Pt transferred back to 2L wall O2.   Therapy Documentation Precautions:  Precautions Precautions: Fall Precaution Comments: Dense L hemi with absent sensation, severe scoliosis, 2L 02, possible L shoulder sublux Restrictions Weight Bearing Restrictions: No   Therapy/Group: Individual Therapy Jill Alexanders Latrelle Bazar, PT, DPT  07/21/2021, 7:52 AM

## 2021-07-22 LAB — CBC
HCT: 35.4 % — ABNORMAL LOW (ref 36.0–46.0)
Hemoglobin: 11 g/dL — ABNORMAL LOW (ref 12.0–15.0)
MCH: 29.2 pg (ref 26.0–34.0)
MCHC: 31.1 g/dL (ref 30.0–36.0)
MCV: 93.9 fL (ref 80.0–100.0)
Platelets: 299 10*3/uL (ref 150–400)
RBC: 3.77 MIL/uL — ABNORMAL LOW (ref 3.87–5.11)
RDW: 13.3 % (ref 11.5–15.5)
WBC: 5.3 10*3/uL (ref 4.0–10.5)
nRBC: 0 % (ref 0.0–0.2)

## 2021-07-22 NOTE — Progress Notes (Signed)
Physical Therapy Session Note  Patient Details  Name: Michelle Bauer MRN: [id number] Date of Birth: 25-May-1941  Today's Date: 07/22/2021 PT Individual Time: 1003-1100 PT Individual Time Calculation (min): 57 min   Short Term Goals: Week 1:  PT Short Term Goal 1 (Week 1): Pt will improve trunk strength and sitting balance in order to sit unsupported with MinA for 2 minutes. PT Short Term Goal 2 (Week 1): Pt will perform sit<>supine with MaxA +1. PT Short Term Goal 3 (Week 1): Pt will perform sit<>stand with pull-to-stand technique with consistent MinA. PT Short Term Goal 4 (Week 1): Pt will maintain static standing balance with LRAD and MaxA +1. PT Short Term Goal 5 (Week 1): Pt will perform stand pivot transfer with ModA +2  Skilled Therapeutic Interventions/Progress Updates:    Pt met sitting in TIS, family present, and ready for therapy. New portable O2 tank retrieved as pt required 2L O2 throughout session. Lotion and guaze applied behind ears due redness observed from O2 tube, LPN notified. Pt wheeled down to 22M rehab gym for standing and ambulation training. Pt completed standing HHA modA +2 2x30-60s. Pt required mulitmodal cues including a mirror to maintain upright posture in sitting and standing. W/ mirror and VC pt demonstrated ability to correct flexed trunk posture during sitting and standing. Pt required multimodal facilitation to scoot hips posteriorly from sacral sitting edge of w/c. Pt completed 2x9f ambulation 3 musketeers w/ R hand rail maxA +2 for w/c follow, maintain L knee extension in stance, flex L knee during swing, and manage portable O2. Pt required frequent encouraging on progress made due to self doubting attitude. Pt wheeled back to room due to time, left sitting in TIS, pillow behind L shoulder to provide upright support, call bell in bed next to pt, family present, and all needs met.  Therapy Documentation Precautions:  Precautions Precautions: Fall Precaution  Comments: Dense L hemi with absent sensation, severe scoliosis, 2L 02, possible L shoulder sublux Restrictions Weight Bearing Restrictions: No General:     Therapy/Group: Individual Therapy  CLucile Shutters SPT  07/22/2021, 12:17 PM

## 2021-07-22 NOTE — Progress Notes (Signed)
PROGRESS NOTE   Subjective/Complaints:  Pt up in bed. Seems to be in good spirits this morning. Reports that she is still "weak" (referring to her performance in therapy)  ROS: limited due to language/communication    Objective:   No results found. Recent Labs    07/22/21 0507  WBC 5.3  HGB 11.0*  HCT 35.4*  PLT 299    No results for input(s): NA, K, CL, CO2, GLUCOSE, BUN, CREATININE, CALCIUM in the last 72 hours.   Intake/Output Summary (Last 24 hours) at 07/22/2021 1129 Last data filed at 07/22/2021 0815 Gross per 24 hour  Intake 440 ml  Output --  Net 440 ml         Physical Exam: Vital Signs Blood pressure 131/63, pulse (!) 58, temperature 98.1 F (36.7 C), temperature source Oral, resp. rate 17, height 5\' 3"  (1.6 m), weight 71.8 kg, SpO2 94 %.  Constitutional: No distress . Vital signs reviewed. HEENT: NCAT, EOMI, oral membranes moist Neck: supple Cardiovascular: RRR without murmur. No JVD    Respiratory/Chest: CTA Bilaterally without wheezes or rales. Normal effort    GI/Abdomen: BS +, non-tender, non-distended Ext: no clubbing, cyanosis, or edema Psych: pleasant and cooperative   Skin: No evidence of breakdown, no evidence of rash Neuro:  very alert, attentive. Seemed to be more automatic with her language today bu definitely still demonstrating expressive deficits.  LUE and LLE tr-1/5 at best. Some early flexor tone developing in LUE and extensor tone in LLE. Moves RUE/RLE 4/5.  Decreased left sided sensation and left inattention.  Musculoskeletal: minimal subluxation left shoulder on exam. Denies pain with ROM of LUE and LLE today    Assessment/Plan: 1. Functional deficits which require 3+ hours per day of interdisciplinary therapy in a comprehensive inpatient rehab setting. Physiatrist is providing close team supervision and 24 hour management of active medical problems listed below. Physiatrist  and rehab team continue to assess barriers to discharge/monitor patient progress toward functional and medical goals  Care Tool:  Bathing    Body parts bathed by patient: Chest, Abdomen   Body parts bathed by helper: Right arm, Left arm, Front perineal area, Buttocks, Right upper leg, Left upper leg, Right lower leg, Left lower leg, Face     Bathing assist Assist Level: Maximal Assistance - Patient 24 - 49%     Upper Body Dressing/Undressing Upper body dressing   What is the patient wearing?: Pull over shirt    Upper body assist Assist Level: Maximal Assistance - Patient 25 - 49%    Lower Body Dressing/Undressing Lower body dressing      What is the patient wearing?: Incontinence brief, Pants     Lower body assist Assist for lower body dressing: Dependent - Patient 0%     Toileting Toileting    Toileting assist Assist for toileting: Dependent - Patient 0%     Transfers Chair/bed transfer  Transfers assist  Chair/bed transfer activity did not occur: Safety/medical concerns  Chair/bed transfer assist level: 2 Helpers (Squat pivot) Chair/bed transfer assistive device: Sliding board   Locomotion Ambulation   Ambulation assist   Ambulation activity did not occur: Safety/medical concerns  Walk 10 feet activity   Assist  Walk 10 feet activity did not occur: Safety/medical concerns        Walk 50 feet activity   Assist Walk 50 feet with 2 turns activity did not occur: Safety/medical concerns         Walk 150 feet activity   Assist Walk 150 feet activity did not occur: Safety/medical concerns         Walk 10 feet on uneven surface  activity   Assist Walk 10 feet on uneven surfaces activity did not occur: Safety/medical concerns         Wheelchair     Assist Is the patient using a wheelchair?: No Type of Wheelchair: Manual Wheelchair activity did not occur: Safety/medical concerns         Wheelchair 50 feet with  2 turns activity    Assist    Wheelchair 50 feet with 2 turns activity did not occur: Safety/medical concerns       Wheelchair 150 feet activity     Assist  Wheelchair 150 feet activity did not occur: Safety/medical concerns   Assist Level: 2 helpers   Blood pressure 131/63, pulse (!) 58, temperature 98.1 F (36.7 C), temperature source Oral, resp. rate 17, height 5\' 3"  (1.6 m), weight 71.8 kg, SpO2 94 %.  Medical Problem List and Plan: 1. 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 She is definitely much more alert and attentive than she was last weekend. 2.  Antithrombotics: -DVT/anticoagulation:  Pharmaceutical: Lovenox             -antiplatelet therapy: N/a due to thalamic bleed.  3. Pain Management: Tylenol or tramadol prn.  4. Insomnia: continue melatonin 3mg  HS for now             -antipsychotic agents: N/A 5. Neuropsych: This patient is not capable of making decisions on her own behalf. 6. Skin/Wound Care: Routine pressure relief measures.  7. Fluids/Electrolytes/Nutrition: Monitor I/O. Check CMET monday             --offering supplements between meals due to low calorie malnutrition (TP-5.6/alb-2.1), family can help too   2/3 po intake has been inconsistent   -prealbumin only 16.8 1/30   -pushing po   -added megace to boost appetite   -ate 100% dinner and 80% breakfast this morning   -recheck bmet monday 8. A fib: Monitor HR TID--continue amiodarone and metoprolol.  rate control is good  --Off Eliquis till follow up with neurology.  9. HTN: Monitor BP TID--SBP goal, 160. On metoprolol 25 mg bid with amiodarone 200 mg/day. Continue amlodipine 2.5mg .   Reasonable control, HR in range  10. Hypoxia: Continue 2 L per [place] and wean oxygen as able.               --encourage IS/Flutter valve, oob   -still needs 1-2 L O2 11. E coli UTI: Completed 5 day course of antibiotic on 01/22.  12. GAD/depression: Manage with Lexapro and Klonopin BID (took QID at home?) per Dr. 2/30.             -sleep fair   -  daughter reported klonopin works well for her   -need to be careful not to oversedate    13. ABLA:    2/3-no overt signs of bleeding  -stool guaic pending  -hgb back up to 11.0 today 14. Overweight: BMI 28.04- provide dietary education.      LOS: 7 days A FACE TO FACE EVALUATION WAS PERFORMED  Ranelle Oyster 07/22/2021, 11:29 AM

## 2021-07-22 NOTE — Progress Notes (Signed)
Occupational Therapy Session Note  Patient Details  Name: Michelle Bauer MRN: [id number] Date of Birth: [birth year]  Session 1 Today's Date: 07/22/2021 OT Individual Time: 9562-1308 OT Individual Time Calculation (min): 60 min   Session 2 Today's Date: 07/22/2021 OT Individual Time: 6578-4696 OT Individual Time Calculation (min): 55 min    Short Term Goals: Week 1:  OT Short Term Goal 1 (Week 1): Pt will complete UB ADL EOB with +2 assist to work on sitting balance OT Short Term Goal 2 (Week 1): Pt will complete 1 grooming task with Mod A using compensatory strategies as needed OT Short Term Goal 3 (Week 1): Pt will complete toilet transfer with 2 assist and LRAD to promote OOB toileting  Skilled Therapeutic Interventions/Progress Updates:    Session 1 Pt supine with no c/o pain, still with low affect/mood re current situation. Encouragement and emotional support provided. Observed redness from Michelle Bauer just distal to pt's nose from the Michelle Bauer pushing into her skin. Consulted with RN and switched out standard Michelle Bauer to high flow  since this tubing is much softer and there is no nasal piece. Pt on 2L and while supine her SpO2 was 90% with pt obviously mouth breathing. Pt having difficulty following cueing for nasal breathing. Pt's dtr Michelle Bauer arrived. Pt reported she was incontinent of urine and encouraged pt to take ownership of calling out for assist with incontinence. Pt required only mod A for peri hygiene in sidelying with min A to maintain L sidelying. Max A to come to R sidelying. Pt came to EOB with heavy facilitation for head-hips relationship and max A. Pt completed stand pivot transfer with max A. Pt completed UB bathing/dressing at the sink with mod-max A for midline orientation. Mod A to don shirt and complete hygiene. Extra time taken to incorporate LUE into functional grasp with items. Pt was left sitting up with all needs met, daughter present.   [place] Stim One was placed on her L anterior and  middle deltoid for joint approximation and muscle activation. 60 min unattended. OT returned to remove and there were no adverse skin reactions or pain reported.  330 pulse width 35 Hz pulse rate On 8 sec/ off 8 sec Ramp up/ down 2 sec Symmetrical Biphasic wave form  Max intensity 151m at 500 Ohm load   Session 2 Pt received supine with no c/o pain, alert and ready for therapy session. She completed bed mobility to EOB with max A. Max A stand pivot transfer to the TIS w/c with L knee blocking. Pt brought to the therapy gym via w/c and transferred to EOM. From here worked on sitting balance with mirror anterior for visual input, adding in lateral leans and wedge posteriorly and then laterally to address pelvic obliquity. Pt required fluctuating min-max A as she fatigued. She worked on Michelle Stanleywith and without e-stim on her wrist/finger flexors and bicep. Good activation of the fingers flexion and bicep, with gravity eliminated she was able to activate the bicep almost full range. Pt transitioned to sidelying on the mat and continued working on active range with e-stim assist. Pt returned to the EOM and transferred to the TIS w/c with max A. Pt returned to her room and was left sitting up with all needs met, daughter present.    Therapy Documentation Precautions:  Precautions Precautions: Fall Precaution Comments: Dense L hemi with absent sensation, severe scoliosis, 2L 02, possible L shoulder sublux Restrictions Weight Bearing Restrictions: No  Therapy/Group: Individual Therapy  Michelle Bauer 07/22/2021, 6:36 AM

## 2021-07-23 NOTE — Progress Notes (Signed)
PROGRESS NOTE   Subjective/Complaints: No complaints Ambulated 30 feet yesterday MaxAx2 No issues reported overnight Requiring 2L O2 with activity  ROS: Limited due to language/communication    Objective:   No results found. Recent Labs    07/22/21 0507  WBC 5.3  HGB 11.0*  HCT 35.4*  PLT 299    No results for input(s): NA, K, CL, CO2, GLUCOSE, BUN, CREATININE, CALCIUM in the last 72 hours.   Intake/Output Summary (Last 24 hours) at 07/23/2021 1449 Last data filed at 07/23/2021 1300 Gross per 24 hour  Intake 360 ml  Output --  Net 360 ml         Physical Exam: Vital Signs Blood pressure (!) 122/54, pulse 60, temperature 97.8 F (36.6 C), resp. rate 16, height 5\' 3"  (1.6 m), weight 71.8 kg, SpO2 94 %.  Gen: no distress, normal appearing HEENT: oral mucosa pink and moist, NCAT Cardio: Reg rate Chest: normal effort, normal rate of breathing Abd: soft, non-distended Ext: no edema Psych: pleasant, normal affect   Skin: No evidence of breakdown, no evidence of rash Neuro:  very alert, attentive. Seemed to be more automatic with her language today bu definitely still demonstrating expressive deficits.  LUE and LLE tr-1/5 at best. Some early flexor tone developing in LUE and extensor tone in LLE. Moves RUE/RLE 4/5.  Decreased left sided sensation and left inattention.  Musculoskeletal: minimal subluxation left shoulder on exam. Denies pain with ROM of LUE and LLE today    Assessment/Plan: 1. Functional deficits which require 3+ hours per day of interdisciplinary therapy in a comprehensive inpatient rehab setting. Physiatrist is providing close team supervision and 24 hour management of active medical problems listed below. Physiatrist and rehab team continue to assess barriers to discharge/monitor patient progress toward functional and medical goals  Care Tool:  Bathing    Body parts bathed by patient:  Chest, Abdomen   Body parts bathed by helper: Right arm, Left arm, Front perineal area, Buttocks, Right upper leg, Left upper leg, Right lower leg, Left lower leg, Face     Bathing assist Assist Level: Maximal Assistance - Patient 24 - 49%     Upper Body Dressing/Undressing Upper body dressing   What is the patient wearing?: Pull over shirt    Upper body assist Assist Level: Maximal Assistance - Patient 25 - 49%    Lower Body Dressing/Undressing Lower body dressing      What is the patient wearing?: Incontinence brief, Pants     Lower body assist Assist for lower body dressing: Dependent - Patient 0%     Toileting Toileting    Toileting assist Assist for toileting: Dependent - Patient 0%     Transfers Chair/bed transfer  Transfers assist  Chair/bed transfer activity did not occur: Safety/medical concerns  Chair/bed transfer assist level: 2 Helpers (Squat pivot) Chair/bed transfer assistive device: Sliding board   Locomotion Ambulation   Ambulation assist   Ambulation activity did not occur: Safety/medical concerns          Walk 10 feet activity   Assist  Walk 10 feet activity did not occur: Safety/medical concerns        Walk 50  feet activity   Assist Walk 50 feet with 2 turns activity did not occur: Safety/medical concerns         Walk 150 feet activity   Assist Walk 150 feet activity did not occur: Safety/medical concerns         Walk 10 feet on uneven surface  activity   Assist Walk 10 feet on uneven surfaces activity did not occur: Safety/medical concerns         Wheelchair     Assist Is the patient using a wheelchair?: No Type of Wheelchair: Manual Wheelchair activity did not occur: Safety/medical concerns         Wheelchair 50 feet with 2 turns activity    Assist    Wheelchair 50 feet with 2 turns activity did not occur: Safety/medical concerns       Wheelchair 150 feet activity     Assist   Wheelchair 150 feet activity did not occur: Safety/medical concerns   Assist Level: 2 helpers   Blood pressure (!) 122/54, pulse 60, temperature 97.8 F (36.6 C), resp. rate 16, height 5\' 3"  (1.6 m), weight 71.8 kg, SpO2 94 %.  Medical Problem List and Plan: 1. 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 She is definitely much more alert and attentive than she was last weekend. 2.  Antithrombotics: -DVT/anticoagulation:  Pharmaceutical: Lovenox             -antiplatelet therapy: N/a due to thalamic bleed.  3. Pain Management: Tylenol or tramadol prn.  4. Insomnia: continue melatonin 3mg  HS for now             -antipsychotic agents: N/A 5. Neuropsych: This patient is not capable of making decisions on her own behalf. 6. Skin/Wound Care: Routine pressure relief measures.  7. Fluids/Electrolytes/Nutrition: Monitor I/O. Check CMET monday             --offering supplements between meals due to low calorie malnutrition (TP-5.6/alb-2.1), family can help too   2/3 po intake has been inconsistent   -prealbumin only 16.8 1/30   -pushing po   -added megace to boost appetite   -ate 100% dinner and 80% breakfast this morning   -recheck bmet monday 8. A fib: Monitor HR TID--rate control is good/bradycardic- continue amiodarone and metoprolol.  --Off Eliquis till follow up with neurology.  9. HTN: Monitor BP TID--SBP goal, 160. BP labile, continue metoprolol 25 mg bid with amiodarone 200 mg/day. Continue amlodipine 2.5mg .   Reasonable control, HR in range  10. Hypoxia: Continue 2 L per [place] and wean oxygen as able.              --encourage IS/Flutter valve, oob   -still needs 1-2 L O2 11. E coli UTI: Completed 5 day course of antibiotic on 01/22.  12. GAD/depression: Manage with Lexapro and  Klonopin BID (took QID at home?) per Dr. Elder Love.             -sleep fair   -daughter reported klonopin works well for her   -need to be careful not to oversedate    13. ABLA:    2/3-no overt signs of  bleeding  -stool guaic pending  -hgb back up to 11.0 today 14. Overweight: BMI 28.04- provide dietary education.      LOS: 8 days A FACE TO FACE EVALUATION WAS PERFORMED  Michelle Bauer Michelle Bauer 07/23/2021, 2:49 PM

## 2021-07-24 NOTE — Progress Notes (Signed)
Physical Therapy Weekly Progress Note  Patient Details  Name: Michelle Bauer MRN: [id number] Date of Birth: 03-13-1941  Beginning of progress report period: July 16, 2021 End of progress report period: July 24, 2021  Today's Date: 07/24/2021 PT Individual Time: 1500-1540 PT Individual Time Calculation (min): 40 min   Patient has met 2 of 5 short term goals.  Patient with slow progress this week limited by slow motor recovery of L hemi-body, decreased motor planning, and challenges of baseline scoliosis posture exaggerated by L trunk weakness with functional mobility. Patient currently requires mod-max A +2 for all mobility, has progressed to gait up to 30 feet using a R rail with +2 w/c follow.   Patient continues to demonstrate the following deficits decreased cardiorespiratoy endurance, abnormal tone, decreased coordination, and decreased motor planning, decreased midline orientation and decreased attention to left, and decreased sitting balance, decreased standing balance, decreased postural control, hemiplegia, and decreased balance strategies and therefore will continue to benefit from skilled PT intervention to increase functional independence with mobility.  Patient progressing toward long term goals..  Continue plan of care.  PT Short Term Goals Week 1:  PT Short Term Goal 1 (Week 1): Pt will improve trunk strength and sitting balance in order to sit unsupported with MinA for 2 minutes. PT Short Term Goal 1 - Progress (Week 1): Progressing toward goal PT Short Term Goal 2 (Week 1): Pt will perform sit<>supine with MaxA +1. PT Short Term Goal 2 - Progress (Week 1): Met PT Short Term Goal 3 (Week 1): Pt will perform sit<>stand with pull-to-stand technique with consistent MinA. PT Short Term Goal 3 - Progress (Week 1): Progressing toward goal PT Short Term Goal 4 (Week 1): Pt will maintain static standing balance with LRAD and MaxA +1. PT Short Term Goal 4 - Progress (Week 1):  Met PT Short Term Goal 5 (Week 1): Pt will perform stand pivot transfer with ModA +2 PT Short Term Goal 5 - Progress (Week 1): Progressing toward goal Week 2:  PT Short Term Goal 1 (Week 2): Pt will improve trunk strength and sitting balance in order to sit unsupported with MinA for 2 minutes. PT Short Term Goal 2 (Week 2): Pt will perform sit<>stand with pull-to-stand technique with consistent mod A consistently. PT Short Term Goal 3 (Week 2): Pt will perform bed<>chair transfers with max A using LRAD.  Skilled Therapeutic Interventions/Progress Updates:     Patient in bed with her daughter at bedside upon PT arrival. Patient alert and agreeable to PT session. Patient reported un-rated headache pain during session, RN made aware and provided Tylenol during session. PT provided repositioning, rest breaks, and distraction as pain interventions throughout session.   Therapeutic Activity: Bed Mobility: Patient performed supine to/from sit with max A and min A +2 for posterior trunk support. Provided verbal cues for progressing through R side-lying and using R elbow to push up to sitting. Patient sat EOB >6 min focused on midline orientation and erect posture to tolerance. Used visual target for R shoulder on R and R hand placement on knee to reduce mild pushing to the L. Patient with urinary urgency followed by incontinence in sitting.  Transfers: Patient performed stand pivot bed<>BSC with max A +2 and max A for L foot management and blocking L knee. Provided verbal cues for initiation, forward weight shift, hip/trunk extension, and sequencing and weight shift for turn. Patient performed sit to stand from [facility] x1 with max A of 1 person and  CGA from +2 during peri-care, limited by increased L lean with prolonged standing >60 sec.  Patient was continent of bowl and bladder and incontinence of bladder during toileting. Performed lower-body clothing management and peri-care with total A.  Patient expressed  frustration and reported feeling ashamed of incontinence. PT provided gentle encouragement and education on stroke impact on bowl and bladder continence and motor deficits impact on urgency. Patient with mild improvement in affect following education, will need reinforcement.  Discussed TIS w/c with patient, who reports that the chair is not very comfortable in sitting. Will trial a new TIS w/c with patient tomorrow and set-up w/c evaluation this week with [facility]. Educated patient on need for w/c for mobility at d/c for safety, however, will continue to focus on ambulation as a therapeutic and possible functional activity prior to d/c. Patient again with poor affect during conversation and reports feeling frustrated by need of w/c for mobility. Will continue education and encouragement for patient.   Patient in bed with her L upper extremity elevated and her daughter at bedside at end of session with breaks locked, bed alarm set, and all needs within reach.   Therapy Documentation Precautions:  Precautions Precautions: Fall Precaution Comments: Dense L hemi with absent sensation, severe scoliosis, 2L 02, possible L shoulder sublux Restrictions Weight Bearing Restrictions: No   Therapy/Group: Individual Therapy  Atilla Zollner L Valjean Ruppel PT, DPT  07/24/2021, 3:46 PM

## 2021-07-24 NOTE — Progress Notes (Signed)
Occupational Therapy Session Note  Patient Details  Name: Michelle Bauer MRN: [id number] Date of Birth: [birth year]  Today's Date: 07/24/2021 OT Individual Time: 1030-1100 OT Individual Time Calculation (min): 30 min    Short Term Goals: Week 2:  OT Short Term Goal 1 (Week 2): Pt will complete toilet transfer with +1 assist using LRAD OT Short Term Goal 2 (Week 2): Pt will recall hemi dressing techniques with no more than min cueing OT Short Term Goal 3 (Week 2): Pt will require no more than mod cueing for positioning of her LUE at rest OT Short Term Goal 4 (Week 2): Pt will sit EOB with no more than mod A  Skilled Therapeutic Interventions/Progress Updates:    Session focused on LUE NMR. Pt in [place] w/c with her daughter [name] present. Worked on gravity eliminated active movement in her bicep and tricep initially without e-stim (via saebo unit). About 20 degrees of active bicep and 5 degrees of tricep. Frequent cueing for visual attention. CVA recovery and NMR education provided throughout session. Shoulder horizontal abduction and adduction performed in gravity eliminated position with 25 degrees of adduction activation and <5 degrees of abduction. E-stim then placed on her bicep which had great activation and she was able with AAROM to bring her hand to mouth with min facilitation. E-stim pad switched to anterior wrist to target finger flexors- pt was able to grasp a water bottle during 8 sec on phase with active involvement. Pt was left sitting up with all needs met, chair alarm set. Daughter present.   Saebo Stim One 330 pulse width 35 Hz pulse rate On 8 sec/ off 8 sec Ramp up/ down 2 sec Symmetrical Biphasic wave form  Max intensity 127m at 500 Ohm load   Therapy Documentation Precautions:  Precautions Precautions: Fall Precaution Comments: Dense L hemi with absent sensation, severe scoliosis, 2L 02, possible L shoulder sublux Restrictions Weight Bearing Restrictions:  No    Therapy/Group: Individual Therapy  SCurtis Sites2/10/2021, 12:17 PM

## 2021-07-24 NOTE — Progress Notes (Signed)
Occupational Therapy Weekly Progress Note  Patient Details  Name: Michelle Bauer MRN: [id number] Date of Birth: August 04, 1940  Beginning of progress report period: 07/16/21 End of progress report period: 07/24/20  Today's Date: 07/24/2021 OT Individual Time: 0850-0950 OT Individual Time Calculation (min): 60 min    Patient has met 3 of 3 short term goals.  Dottie continues to make good progress with OT,   Patient continues to demonstrate the following deficits: muscle weakness and muscle joint tightness, impaired timing and sequencing, unbalanced muscle activation, decreased coordination, and decreased motor planning, decreased midline orientation and decreased attention to right, delayed processing, and decreased sitting balance, decreased standing balance, decreased postural control, hemiplegia, and decreased balance strategies and therefore will continue to benefit from skilled OT intervention to enhance overall performance with BADL and Reduce care partner burden.  Patient progressing toward long term goals..  Continue plan of care.  OT Short Term Goals Week 1:  OT Short Term Goal 1 (Week 1): Pt will complete UB ADL EOB with +2 assist to work on sitting balance OT Short Term Goal 1 - Progress (Week 1): Met OT Short Term Goal 2 (Week 1): Pt will complete 1 grooming task with Mod A using compensatory strategies as needed OT Short Term Goal 2 - Progress (Week 1): Met OT Short Term Goal 3 (Week 1): Pt will complete toilet transfer with 2 assist and LRAD to promote OOB toileting OT Short Term Goal 3 - Progress (Week 1): Met Week 2:  OT Short Term Goal 1 (Week 2): Pt will complete toilet transfer with +1 assist using LRAD OT Short Term Goal 2 (Week 2): Pt will recall hemi dressing techniques with no more than min cueing OT Short Term Goal 3 (Week 2): Pt will require no more than mod cueing for positioning of her LUE at rest OT Short Term Goal 4 (Week 2): Pt will sit EOB with no more than mod  A  Skilled Therapeutic Interventions/Progress Updates:    Pt received supine, no c/o pain. Pt completed bed mobility to the R with min A, she completed anterior peri hygiene but then began urinating and having flatulence. Encouraged pt to get on bedpan and pt was agreeable. Pt had no further void. Posterior hygiene provided with max A. Max A bed mobility to EOB with more core activation/support than previous sessions. She required mod-max A for sitting balance overall. Max A stand pivot transfer with improved LLE activation. Pt able to activate quad and kick out L foot for LB dressing. Max A overall still required for LB dressing. Overall pt had improved midline orientation and awareness today. Still with a heavy L lean. Pt completed UB bathing with min A at the sink, mod A to don shirt. PROM provided to pt's LUE, with resistance/tone felt in shoulder flexion/abduction/extension and external rotation. Provided gentle stretch with increased time to allow muscle to relax into stretch. Pt was left sitting up in the TIS w/c with all needs met, chair alarm set.    Saebo Stim One applied to pt's L anterior and middle/posterior deltoid for joint approximation and muscle activation. 60 min unattended, OT returned to remove saebo and pt had no adverse skin reactions. No c/o pain.  330 pulse width 35 Hz pulse rate On 8 sec/ off 8 sec Ramp up/ down 2 sec Symmetrical Biphasic wave form  Max intensity 168m at 500 Ohm load   Therapy Documentation Precautions:  Precautions Precautions: Fall Precaution Comments: Dense L hemi with absent  sensation, severe scoliosis, 2L 02, possible L shoulder sublux Restrictions Weight Bearing Restrictions: No   Therapy/Group: Individual Therapy  Curtis Sites 07/24/2021, 7:49 AM

## 2021-07-25 NOTE — Progress Notes (Signed)
Occupational Therapy Session Note  Patient Details  Name: Michelle Bauer MRN: [id number] Date of Birth: [birth year]  Today's Date: 07/25/2021 OT Individual Time: 1020-1050 OT Individual Time Calculation (min): 30 min   Short Term Goals: Week 2:  OT Short Term Goal 1 (Week 2): Pt will complete toilet transfer with +1 assist using LRAD OT Short Term Goal 2 (Week 2): Pt will recall hemi dressing techniques with no more than min cueing OT Short Term Goal 3 (Week 2): Pt will require no more than mod cueing for positioning of her LUE at rest OT Short Term Goal 4 (Week 2): Pt will sit EOB with no more than mod A  Skilled Therapeutic Interventions/Progress Updates:    Pt greeted semi-reclined in bed with eyes closed and on 2L of O2. Pt reported recent return from PT session where she tried walking along the wall. PT agreeable to OT treatment but did not want to get back up at this time. OT brought pt into gravity eliminated sidelying position in bed with max A to the R. OT utilized pillows to help maintain sidelying position. OT provided joint input through elbow and wrist to help bring L UE through full ROM. Sidelying shoulder elevation and depression with pt demonstrating trace activation. OT able to elicit grasp and issued soft yellow sponge to continue working on grasp and release. Pt able to squeeze soft foam 10x with intermittent rest breaks. Pt left semi-reclined in bed with bed alarm on, call bell in reach, and needs met.   Therapy Documentation Precautions:  Precautions Precautions: Fall Precaution Comments: Dense L hemi with absent sensation, severe scoliosis, 2L 02, possible L shoulder sublux Restrictions Weight Bearing Restrictions: No Pain: Denies pain  Therapy/Group: Individual Therapy  Valma Cava 07/25/2021, 10:46 AM

## 2021-07-25 NOTE — Progress Notes (Signed)
Occupational Therapy Session Note  Patient Details  Name: Michelle Bauer MRN: [id number] Date of Birth: 1941/01/01  Session 1 Today's Date: 07/25/2021 OT Individual Time: 3818-2993 OT Individual Time Calculation (min): 60 min   Session 2 Today's Date: 07/25/2021 OT Individual Time: 1304-1400 OT Individual Time Calculation (min): 56 min    Short Term Goals: Week 2:  OT Short Term Goal 1 (Week 2): Pt will complete toilet transfer with +1 assist using LRAD OT Short Term Goal 2 (Week 2): Pt will recall hemi dressing techniques with no more than min cueing OT Short Term Goal 3 (Week 2): Pt will require no more than mod cueing for positioning of her LUE at rest OT Short Term Goal 4 (Week 2): Pt will sit EOB with no more than mod A  Skilled Therapeutic Interventions/Progress Updates:    Session 1 Pt received supine, no c/o pain and agreeable to OT session. She was often slow to respond/slow processing but this is her baseline. She completed bed mobility to EOB with improved LLE activation and maintenance of knee flexion to facilitate rolling, requiring only mod A for transfer. Stedy used to transfer pt- she was able to come into standing with max facilitation of the LUE and only min A to power up. She was transferred via stedy to the standard [facility] to continue assessing safety of nursing completing this transfer. Her sitting balance fluctuated and she could be as good as CGA with cueing for her RUE to rest on the sink to achieve midline, and then with fatigue she could require as much as max A with facilitated LUE weightbearing. Continued pushing through the RUE at times. Mod A for UB bathing and dressing seated on the BSC. Max A for LB ADLs and max +2 for standing from the [facility] with heavy blocking of the LLE. Pt completed another stedy transfer and while perched completed oral care with mod A for sitting balance and heavy cueing for midline awareness. Pt was transferred to the TIS w/c where she was left  sitting up, all needs met, chair alarm on.   Saebo Stim One placed on her L middle and anterior deltoid for joint approximation and muscle activation. 60 min unattended. No adverse skin reactions or reports or pain.  330 pulse width 35 Hz pulse rate On 8 sec/ off 8 sec Ramp up/ down 2 sec Symmetrical Biphasic wave form  Max intensity 168m at 500 Ohm load    Session 2 Pt supine, more lethargic than normal, daughter Michelle Beachreporting she is tired from therapy today. Pt still agreeable to participate and perked up once OOB. Bed mobility R and L with mod A for total A peri hygiene and brief change. Improved ability to separate head vs hips rolling and body mechanics overall. Bed mobility to EOB with max A. Mod A to maintain static sitting balance. Pt completed stand pivot transfer to the w/c with max A. She was taken to the therapy gym via w/c. Pt on 2L O2 via [place] throughout session. A lateral support was placed in her w/c on the L to assist with trunk midline orientation and hip obliquity. She transferred to the therapy mat and worked on sitting balance, midline orientation/awareness, and core activation. Several NDT strategies used to facilitate upright posture and weightbearing through the LUE. Max facilitation at the elbow required to remain in full extension. She completed functional reaching to the R and required mod A for returning to midline. Pt returned to the w/c, improved  standing overall this session, max A stand pivot. She was returned to the room and left supine with all needs met, bed alarm set.     Therapy Documentation Precautions:  Precautions Precautions: Fall Precaution Comments: Dense L hemi with absent sensation, severe scoliosis, 2L 02, possible L shoulder sublux Restrictions Weight Bearing Restrictions: No  Therapy/Group: Individual Therapy  Curtis Sites 07/25/2021, 6:10 AM

## 2021-07-25 NOTE — Progress Notes (Signed)
PROGRESS NOTE   Subjective/Complaints: Up in w/c. No new complaints today. Told me her daughter knitted the beret she's wearing.  ROS: limited due to language/communication    Objective:   No results found. No results for input(s): WBC, HGB, HCT, PLT in the last 72 hours.   No results for input(s): NA, K, CL, CO2, GLUCOSE, BUN, CREATININE, CALCIUM in the last 72 hours.   Intake/Output Summary (Last 24 hours) at 07/25/2021 1204 Last data filed at 07/25/2021 0845 Gross per 24 hour  Intake 840 ml  Output --  Net 840 ml         Physical Exam: Vital Signs Blood pressure 135/60, pulse 63, temperature 98.2 F (36.8 C), temperature source Oral, resp. rate (!) 22, height 5\' 3"  (1.6 m), weight 71.8 kg, SpO2 94 %.  Constitutional: No distress . Vital signs reviewed. HEENT: NCAT, EOMI, oral membranes moist Neck: supple Cardiovascular: RRR without murmur. No JVD    Respiratory/Chest: CTA Bilaterally without wheezes or rales. Normal effort    GI/Abdomen: BS +, non-tender, non-distended Ext: no clubbing, cyanosis, or edema Psych: pleasant and cooperative  Skin: No evidence of breakdown, no evidence of rash Neuro:  very alert, attentive. Seemed to be more automatic with her language today bu definitely still demonstrating expressive deficits.  LUE and LLE 1/5 with wrist/finger flexion, didn't see voluntary movement in leg.  Some early flexor tone developing in LUE and extensor tone in LLE. Moves RUE/RLE 4/5.  Decreased left sided sensation and left inattention.  Musculoskeletal: minimal subluxation left shoulder on exam. Denies pain with ROM of LUE and LLE today    Assessment/Plan: 1. Functional deficits which require 3+ hours per day of interdisciplinary therapy in a comprehensive inpatient rehab setting. Physiatrist is providing close team supervision and 24 hour management of active medical problems listed below. Physiatrist  and rehab team continue to assess barriers to discharge/monitor patient progress toward functional and medical goals  Care Tool:  Bathing    Body parts bathed by patient: Right arm, Chest, Abdomen, Front perineal area, Buttocks, Right upper leg, Left upper leg, Face   Body parts bathed by helper: Right lower leg, Left lower leg, Left arm     Bathing assist Assist Level: Moderate Assistance - Patient 50 - 74%     Upper Body Dressing/Undressing Upper body dressing   What is the patient wearing?: Pull over shirt    Upper body assist Assist Level: Moderate Assistance - Patient 50 - 74%    Lower Body Dressing/Undressing Lower body dressing      What is the patient wearing?: Incontinence brief, Pants     Lower body assist Assist for lower body dressing: Maximal Assistance - Patient 25 - 49%     Toileting Toileting    Toileting assist Assist for toileting: Maximal Assistance - Patient 25 - 49%     Transfers Chair/bed transfer  Transfers assist  Chair/bed transfer activity did not occur: Safety/medical concerns  Chair/bed transfer assist level: Maximal Assistance - Patient 25 - 49% Chair/bed transfer assistive device: Sliding board   Locomotion Ambulation   Ambulation assist   Ambulation activity did not occur: Safety/medical concerns  Walk 10 feet activity   Assist  Walk 10 feet activity did not occur: Safety/medical concerns        Walk 50 feet activity   Assist Walk 50 feet with 2 turns activity did not occur: Safety/medical concerns         Walk 150 feet activity   Assist Walk 150 feet activity did not occur: Safety/medical concerns         Walk 10 feet on uneven surface  activity   Assist Walk 10 feet on uneven surfaces activity did not occur: Safety/medical concerns         Wheelchair     Assist Is the patient using a wheelchair?: No Type of Wheelchair: Manual Wheelchair activity did not occur: Safety/medical  concerns         Wheelchair 50 feet with 2 turns activity    Assist    Wheelchair 50 feet with 2 turns activity did not occur: Safety/medical concerns       Wheelchair 150 feet activity     Assist  Wheelchair 150 feet activity did not occur: Safety/medical concerns   Assist Level: 2 helpers   Blood pressure 135/60, pulse 63, temperature 98.2 F (36.8 C), temperature source Oral, resp. rate (!) 22, height 5\' 3"  (1.6 m), weight 71.8 kg, SpO2 94 %.  Medical Problem List and Plan: 1. 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 Making some functional gains. 2.  Antithrombotics: -DVT/anticoagulation:  Pharmaceutical: Lovenox             -antiplatelet therapy: N/a due to thalamic bleed.  3. Pain Management: Tylenol or tramadol prn.  4. Insomnia: continue melatonin 3mg  HS for now             -antipsychotic agents: N/A 5. Neuropsych: This patient is not capable of making decisions on her own behalf. 6. Skin/Wound Care: Routine pressure relief measures.  7. Fluids/Electrolytes/Nutrition:               --offering supplements between meals due to low calorie malnutrition (TP-5.6/alb-2.1), family can help too   2/3 po intake has been inconsistent   -prealbumin only 16.8 1/30   -pushing po   -added megace to boost appetite   2/6 -intake much improved--check labs Tuesday 8. A fib: Monitor HR TID--rate control is good/bradycardic- continue amiodarone and metoprolol.  --Off Eliquis till follow up with neurology.  9. HTN: Monitor BP TID--SBP goal, 160. BP labile, continue metoprolol 25 mg bid with amiodarone 200 mg/day. Continue amlodipine 2.5mg .   Reasonable control, HR in range  10. Hypoxia: Continue 2 L per [place] and wean oxygen as able.              --encourage IS/Flutter valve, oob   -still needs 1-2 L O2 11. E coli UTI:  Completed 5 day course of antibiotic on 01/22.  12. GAD/depression: Manage with Lexapro and Klonopin BID (took QID at home?) per Dr. Elder Love.             -sleep fair   -daughter reported klonopin works well for her   -need to be careful not to oversedate    13. ABLA:     -no overt signs of bleeding  -  stool guaic pending  -hgb at 11.0  2/3 14. Overweight: BMI 28.04- provide dietary education.      LOS: 10 days A FACE TO FACE EVALUATION WAS PERFORMED  Meredith Staggers 07/25/2021, 12:04 PM

## 2021-07-25 NOTE — Progress Notes (Signed)
Physical Therapy Session Note  Patient Details  Name: Michelle Bauer MRN: [id number] Date of Birth: 10/25/1940  Today's Date: 07/25/2021 PT Individual Time: 0905-1015 PT Individual Time Calculation (min): 70 min   Short Term Goals: Week 2:  PT Short Term Goal 1 (Week 2): Pt will improve trunk strength and sitting balance in order to sit unsupported with MinA for 2 minutes. PT Short Term Goal 2 (Week 2): Pt will perform sit<>stand with pull-to-stand technique with consistent mod A consistently. PT Short Term Goal 3 (Week 2): Pt will perform bed<>chair transfers with max A using LRAD.  Skilled Therapeutic Interventions/Progress Updates:     Patient in TIS w/c in the room upon PT arrival. Patient alert and agreeable to PT session. Patient reported 4/10 back pain during session, patient declined pain medicine at this time. PT provided repositioning, rest breaks, and distraction as pain interventions throughout session.   Therapeutic Activity: Bed Mobility: Patient performed sit to supine with max A for trunk and lower extremity management. Provided verbal cues for use of R arm to lower her trunk to side-lying and bringing knees to chest to lift her legs onto the bed. Transfers: Patient performed squat pivot transfers TIS w/c<>mat table and TIS w/c>bed with mod A with PT blocking L knee. Provided multi-modal cues for hand placement and head-hips relationship for proper technique and decreased assist with transfers. Patient performed sit to/from stand x2 with mod A using R hand rail. Provided multi-modal cues and facilitation for forward and R weight shift "toward the wall" to prevent pushing, and hip/knee/trunk extension L>R.  Gait Training:  Patient ambulated 2x15 feet using R rail with max A for trunk support and L lower extremity advancement and +2 for w/c follow for safety. Ambulated with L and forward trunk lean, decreased L weight shift, good quad activation in L stance phase without knee  buckling with facilitation, blocked L knee hyper extension due to intermittent occurrence in stance, and facilitated L weight shift to allow for L foot placement with max-total A. Provided verbal cues for sequencing, and visual cue to lean trunk toward the wall to reduce pushing and erect posture.  Neuromuscular Re-ed: Patient performed the following sitting and standing balance and midline orientation activities: -sitting EOM >6 min progressing from min A to supervision, focused on maintaining midline using visual cues/targets, and erect posture with intermittent tactile cues for L trunk elongation and thoracic extension -sit to stand holding back of TIS w/c with R hand to focus on forward weight shift and midline orientation with visual target with mod-min A x2 and PT facilitating downward push and extension through L knee -standing balance 2x30-60 sec with min A in set-up above, focused on L knee quad activation and trunk extension in midline   Patient in bed due to fatigue with her daughter in the room at end of session with breaks locked, bed alarm set, and all needs within reach.   Therapy Documentation Precautions:  Precautions Precautions: Fall Precaution Comments: Dense L hemi with absent sensation, severe scoliosis, 2L 02, possible L shoulder sublux Restrictions Weight Bearing Restrictions: No   Therapy/Group: Individual Therapy  Lyra Alaimo L Fabian Walder PT, DPT  07/25/2021, 12:55 PM

## 2021-07-26 NOTE — Progress Notes (Signed)
Occupational Therapy Session Note  Patient Details  Name: Michelle Bauer MRN: [id number] Date of Birth: 25-Sep-1940  Today's Date: 07/26/2021 OT Individual Time: 0810-0900 OT Individual Time Calculation (min): 50 min    Short Term Goals: Week 2:  OT Short Term Goal 1 (Week 2): Pt will complete toilet transfer with +1 assist using LRAD OT Short Term Goal 2 (Week 2): Pt will recall hemi dressing techniques with no more than min cueing OT Short Term Goal 3 (Week 2): Pt will require no more than mod cueing for positioning of her LUE at rest OT Short Term Goal 4 (Week 2): Pt will sit EOB with no more than mod A  Skilled Therapeutic Interventions/Progress Updates:    Pt received supine with no c/o pain. Pt was very slow to respond today, usually the case when her daughters aren't present. Pt rolled R and L for max A peri hygiene. Urine had soaked through to bed so assisted pt to EOB with max A. Improved sitting balance, requiring min-mod A overall. Max A stand pivot transfer to the w/c. Pt stood at the sink with mod A and OT provided max A peri hygiene. She began urinating in standing, as well as having smear of BM. Pt returned to sitting, her bed linens were changed and she was returned to supine in bed for peri hygiene d/t OT not having +2 assist. Pt rolled R and L with min-mod A overall while OT donned clean brief. She was left supine with all needs met, bed alarm set.   [place] Stim One applied to pt's L anterior and middle deltoid for joint approximation and muscle activation. 60 min unattended, no adverse skin reaction or pain reported.  330 pulse width 35 Hz pulse rate On 8 sec/ off 8 sec Ramp up/ down 2 sec Symmetrical Biphasic wave form  Max intensity 139m at 500 Ohm load   Therapy Documentation Precautions:  Precautions Precautions: Fall Precaution Comments: Dense L hemi with absent sensation, severe scoliosis, 2L 02, possible L shoulder sublux Restrictions Weight Bearing  Restrictions: No   Therapy/Group: Individual Therapy  SCurtis Sites2/12/2021, 6:45 AM

## 2021-07-26 NOTE — Progress Notes (Signed)
Physical Therapy Session Note  Patient Details  Name: Michelle Bauer MRN: [id number] Date of Birth: 07/09/1940  Today's Date: 07/26/2021 PT Individual Time: 0950-1015 PT Individual Time Calculation (min): 25 min   Short Term Goals: Week 2:  PT Short Term Goal 1 (Week 2): Pt will improve trunk strength and sitting balance in order to sit unsupported with MinA for 2 minutes. PT Short Term Goal 2 (Week 2): Pt will perform sit<>stand with pull-to-stand technique with consistent mod A consistently. PT Short Term Goal 3 (Week 2): Pt will perform bed<>chair transfers with max A using LRAD.  Skilled Therapeutic Interventions/Progress Updates:    Pt met asleep in bed, slowly awoken, reported no pain, and agreeable to PT. New TIS w/c present in room w/ oversized soiled cushion. 16x16 clean rojo cushion retrieved and replaced soiled cushion after cleaning w/c. Pants donned maxA for time w/ pt able to initiate rolling R and L to don pants. Supine<>sit EOB modA +2 for posterior trunk support EOB, transferred maxA +2 squat pivot to w/c due to time. ModA to posterior scoot hips in w/c w/ pt demonstrating ability to control forward trunk lean w/ w/c tilted posteriorly. Pt left sitting upright in w/c, daughter present, call bell next to pt, and all needs met.  Therapy Documentation Precautions:  Precautions Precautions: Fall Precaution Comments: Dense L hemi with absent sensation, severe scoliosis, 2L 02, possible L shoulder sublux Restrictions Weight Bearing Restrictions: No General:     Therapy/Group: Individual Therapy  Lucile Shutters, SPT  07/26/2021, 7:47 AM

## 2021-07-26 NOTE — Patient Care Conference (Signed)
Inpatient RehabilitationTeam Conference and Plan of Care Update Date: 07/26/2021   Time: 10:09 AM    Patient Name: Michelle Bauer      Medical Record Number: [id number]  Date of Birth: 1941-03-27 Sex: Female         Room/Bed: 5C20C/5C20C-01 Payor Info: Payor: MEDICARE / Plan: MEDICARE PART A AND B / Product Type: *No Product type* /    Admit Date/Time:  07/15/2021  3:55 PM  Primary Diagnosis:  ICH (intracerebral hemorrhage) [facility])  Hospital Problems: Principal Problem:   ICH (intracerebral hemorrhage) [facility])    Expected Discharge Date: Expected Discharge Date: 08/10/21  Team Members Present: Physician leading conference: Dr. Faith Rogue Social Worker Present: Cecile Sheerer, LCSWA Nurse Present: Kennyth Arnold, RN PT Present: Karolee Stamps, PT OT Present: Roney Mans, OT PPS Coordinator present : Fae Pippin, SLP     Current Status/Progress Goal Weekly Team Focus  Bowel/Bladder   incontinent b/b  Gain continences of B/B  time toileting q 2hr ang prn   Swallow/Nutrition/ Hydration             ADL's   Slow but steady progress. Skilled max A transfer, improved sitting balance. LUE at same level as before- <25% composite finger flex/ext, gravity eliminated elbow flex/ext about 10 degrees  min A overall  ADL retraining, sitting/standing balance, LUE NMR, transfers   Mobility   mod-max A overall with +2 for safety/w/c follow, gait 30 feet using R rail, controversive pushing improving, but still present, intermittent volitional activation of L quads in sitting and standing, poor affect about loss of independence  Min A overall  balance, postural control, midline orientation, functional mobility, gait training, activity tolerance, sitting tolerance and positioning, patient/caregiver education   Communication             Safety/Cognition/ Behavioral Observations            Pain   5/10 generalized pain  < 3  assess pain q 4hr and prn   Skin   redness from nasal cannula   no new breakdown  assess skin q shift and prn     Discharge Planning:  D/c location pending level of care needs: ALF with private aide and HH therapies vs SNF.   Team Discussion: Now on Megace. PO intake improving. Should plan on SNF placement. Incontinent B/B. Reports 5/10 generalized pain. Redness to base of nose from nasal cannula. Patient reports not knowing when she has to go to the bathroom. Poor affect. Need Neuropsychology consult.  Patient on target to meet rehab goals: yes, min assist goals, may have to downgrade goals. Can transfer but it is very skilled. Improved sitting balance. LUE same as before. Mod/max assist +2 for safety. Has done some walking with use of rail. Heavy push to left.   *See Care Plan and progress notes for long and short-term goals.   Revisions to Treatment Plan:  Adjusting medications.   Teaching Needs: Family education, bowel/bladder management, medication/pain management, skin/wound care, transfer/gait training, etc.  Current Barriers to Discharge: Decreased caregiver support, Home enviroment access/layout, Incontinence, Wound care, Lack of/limited family support, Weight, and Medication compliance  Possible Resolutions to Barriers: Family education SNF placement [facility]     Medical Summary Current Status: improved arousal and mentation. still aphasic. po intake better. bp better controlled.  Barriers to Discharge: Medical stability   Possible Resolutions to [employer] Focus: daily assessment of labs and patient data, nutritional support   Continued Need for Acute Rehabilitation Level of Care: The  patient requires daily medical management by a physician with specialized training in physical medicine and rehabilitation for the following reasons: Direction of a multidisciplinary physical rehabilitation program to maximize functional independence : Yes Medical management of patient stability for increased activity during participation in an  intensive rehabilitation regime.: Yes Analysis of laboratory values and/or radiology reports with any subsequent need for medication adjustment and/or medical intervention. : Yes   I attest that I was present, lead the team conference, and concur with the assessment and plan of the team.   Tennis Must 07/26/2021, 1:34 PM

## 2021-07-26 NOTE — Progress Notes (Signed)
Patient ID: Michelle Bauer, female   DOB: 18-Aug-1940, 81 y.o.   MRN: [id number]  SW met with pt and pt dtr Michelle Bauer to provide updates from team conference, and recommendation of SNF placement. SW reviewed SNF placement process. SW will follow-up on Friday to discuss SNF preference.   SW sent out SNF referral.   1628- Forrestine Him and Executive Director [phone]) to inform on above with regard to current plan. SW will update once there is more information.   Loralee Pacas, MSW, [place] Office: [phone] Cell: [phone] Fax: [phone]

## 2021-07-26 NOTE — Progress Notes (Signed)
PROGRESS NOTE   Subjective/Complaints: No new issues overnight. Seems to be in good spirits this morning.   ROS: Limited due to cognitive/behavioral    Objective:   No results found. No results for input(s): WBC, HGB, HCT, PLT in the last 72 hours.   No results for input(s): NA, K, CL, CO2, GLUCOSE, BUN, CREATININE, CALCIUM in the last 72 hours.   Intake/Output Summary (Last 24 hours) at 07/26/2021 1154 Last data filed at 07/26/2021 0810 Gross per 24 hour  Intake 240 ml  Output --  Net 240 ml         Physical Exam: Vital Signs Blood pressure 109/82, pulse 80, temperature 98.1 F (36.7 C), resp. rate 16, height 5\' 3"  (1.6 m), weight 71.8 kg, SpO2 91 %.  Constitutional: No distress . Vital signs reviewed. HEENT: NCAT, EOMI, oral membranes moist Neck: supple Cardiovascular: RRR without murmur. No JVD    Respiratory/Chest: CTA Bilaterally without wheezes or rales. Normal effort    GI/Abdomen: BS +, non-tender, non-distended Ext: no clubbing, cyanosis, or edema Psych: pleasant and cooperative  Skin: No evidence of breakdown, no evidence of rash Neuro:  very alert, attentive. Seemed to be more automatic with her language today bu definitely still demonstrating expressive deficits.  LUE and LLE 1/5 with wrist/finger flexion, didn't see voluntary movement in leg.  Some early flexor tone developing in LUE and extensor tone in LLE. Moves RUE/RLE 4/5.  Sensation 1+/2 on left Musculoskeletal: mild  subluxation left shoulder on exam. Denies pain with ROM of LUE and LLE today    Assessment/Plan: 1. Functional deficits which require 3+ hours per day of interdisciplinary therapy in a comprehensive inpatient rehab setting. Physiatrist is providing close team supervision and 24 hour management of active medical problems listed below. Physiatrist and rehab team continue to assess barriers to discharge/monitor patient progress toward  functional and medical goals  Care Tool:  Bathing    Body parts bathed by patient: Right arm, Chest, Abdomen, Front perineal area, Buttocks, Right upper leg, Left upper leg, Face   Body parts bathed by helper: Right lower leg, Left lower leg, Left arm     Bathing assist Assist Level: Moderate Assistance - Patient 50 - 74%     Upper Body Dressing/Undressing Upper body dressing   What is the patient wearing?: Pull over shirt    Upper body assist Assist Level: Moderate Assistance - Patient 50 - 74%    Lower Body Dressing/Undressing Lower body dressing      What is the patient wearing?: Incontinence brief, Pants     Lower body assist Assist for lower body dressing: Maximal Assistance - Patient 25 - 49%     Toileting Toileting    Toileting assist Assist for toileting: Maximal Assistance - Patient 25 - 49%     Transfers Chair/bed transfer  Transfers assist  Chair/bed transfer activity did not occur: Safety/medical concerns  Chair/bed transfer assist level: Maximal Assistance - Patient 25 - 49% Chair/bed transfer assistive device: Sliding board   Locomotion Ambulation   Ambulation assist   Ambulation activity did not occur: Safety/medical concerns          Walk 10 feet activity  Assist  Walk 10 feet activity did not occur: Safety/medical concerns        Walk 50 feet activity   Assist Walk 50 feet with 2 turns activity did not occur: Safety/medical concerns         Walk 150 feet activity   Assist Walk 150 feet activity did not occur: Safety/medical concerns         Walk 10 feet on uneven surface  activity   Assist Walk 10 feet on uneven surfaces activity did not occur: Safety/medical concerns         Wheelchair     Assist Is the patient using a wheelchair?: No Type of Wheelchair: Manual Wheelchair activity did not occur: Safety/medical concerns         Wheelchair 50 feet with 2 turns activity    Assist     Wheelchair 50 feet with 2 turns activity did not occur: Safety/medical concerns       Wheelchair 150 feet activity     Assist  Wheelchair 150 feet activity did not occur: Safety/medical concerns   Assist Level: 2 helpers   Blood pressure 109/82, pulse 80, temperature 98.1 F (36.7 C), resp. rate 16, height 5\' 3"  (1.6 m), weight 71.8 kg, SpO2 91 %.  Medical Problem List and Plan: 1. 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 Interdisciplinary team conference today to discuss goals, barriers to discharge, and dc planning.    -spoke with daughter today. Likely the plan will be to go to SNF given her slow progress.  2.  Antithrombotics: -DVT/anticoagulation:  Pharmaceutical: Lovenox             -antiplatelet therapy: N/a due to thalamic bleed.  3. Pain Management: Tylenol or tramadol prn.  4. Insomnia: continue melatonin 3mg  HS for now             -antipsychotic agents: N/A 5. Neuropsych: This patient is not capable of making decisions on her own behalf. 6. Skin/Wound Care: Routine pressure relief measures.  7. Fluids/Electrolytes/Nutrition:               --offering supplements between meals due to low calorie malnutrition (TP-5.6/alb-2.1), family can help too   2/3 po intake has been inconsistent   -prealbumin only 16.8 1/30   -pushing po   -added megace to boost appetite   2/7 -intake much improved- eating 50-75% -check labs Thursday 8. A fib: Monitor HR TID--rate control is good/bradycardic- continue amiodarone and metoprolol.  --Off Eliquis till follow up with neurology.  9. HTN: Monitor BP TID--SBP goal, 160. BP labile, continue metoprolol 25 mg bid with amiodarone 200 mg/day. Continue amlodipine 2.5mg .   Reasonable control, HR in range  10. Hypoxia: Continue 2 L per [place] and wean oxygen as able.              --encourage  IS/Flutter valve, oob   -still needs 1-2 L O2 to maintain sats 11. E coli UTI: Completed 5 day course of antibiotic on 01/22.  12. GAD/depression: Manage with Lexapro and Klonopin BID (took QID at home?) per Dr. Elder Love.             -sleep fair   -daughter reported klonopin works well for  her   -need to be careful not to oversedate    13. ABLA:     -no overt signs of bleeding  -stool guaic pending  -hgb at 11.0  2/3---recheck Thursday        LOS: 11 days A FACE TO FACE EVALUATION WAS PERFORMED  Meredith Staggers 07/26/2021, 11:54 AM

## 2021-07-26 NOTE — Progress Notes (Signed)
Occupational Therapy Session Note  Patient Details  Name: Michelle Bauer MRN: [id number] Date of Birth: 09-Aug-1940  Today's Date: 07/26/2021 OT Individual Time: 1100-1130 OT Individual Time Calculation (min): 30 min   Short Term Goals: Week 2:  OT Short Term Goal 1 (Week 2): Pt will complete toilet transfer with +1 assist using LRAD OT Short Term Goal 2 (Week 2): Pt will recall hemi dressing techniques with no more than min cueing OT Short Term Goal 3 (Week 2): Pt will require no more than mod cueing for positioning of her LUE at rest OT Short Term Goal 4 (Week 2): Pt will sit EOB with no more than mod A  Skilled Therapeutic Interventions/Progress Updates:    Pt greeted seated in TIS wc and agreeable to OT treatment session. Pt on 2L of O2 throughout session. Pt brought to therapy gym and worked on sit<>stands and lateral weight shift. Incorporated weight bearing through L UE while pt reached with R UE to place clothes pins on higher pole. OT facilitation for upright hip and trunk. L UE NMR using PNF patterns and hand over hand A. Pt with voluntary flexor synergy pattern noted. Addressed L UE forearm pronation and supination with pt able to initiate in both directions today. Addressed grasp with foam block and educated daughter on continuing to work on grasp and L UE activation. Pt returned to room and left seated in TIS wc with daughter present and needs met.   Therapy Documentation Precautions:  Precautions Precautions: Fall Precaution Comments: Dense L hemi with absent sensation, severe scoliosis, 2L 02, possible L shoulder sublux Restrictions Weight Bearing Restrictions: No Pain:  Denies pain   Therapy/Group: Individual Therapy  Valma Cava 07/26/2021, 11:35 AM

## 2021-07-26 NOTE — NC FL2 (Signed)
[place] LEVEL OF CARE SCREENING TOOL     IDENTIFICATION  Patient Name: Michelle Bauer Birthdate: 04-13-1941 Sex: female Admission Date (Current Location): 07/15/2021  [facility] and [place] Number:  Herbalist and Address:  [facility] [place], [place] [address] [place], [place] [address]      Provider Number: [id number]  Attending Physician Name and Address:  Meredith Staggers, MD  Relative Name and Phone Number:  Rhunette Croft [phone]    Current Level of Care: Hospital Recommended Level of Care: [place] Prior Approval Number:    Date Approved/Denied:   PASRR Number: [id number]  Discharge Plan: SNF    Current Diagnoses: Patient Active Problem List   Diagnosis Date Noted   ICH (intracerebral hemorrhage) ([place]) 07/15/2021   Normocytic anemia 07/09/2021   Intracranial hemorrhage ([place]) 07/08/2021   Urinary tract infection 07/08/2021   Hypoxia 07/08/2021   Stroke (cerebrum) ([place]) 07/06/2021   Paroxysmal A-fib ([place])    Left lower lobe pneumonia 03/15/2020   Atrial fibrillation with rapid ventricular response ([place])    Acute delirium 03/11/2020   Leukocytosis 03/11/2020   Closed right hip fracture ([place]) 03/09/2020   Essential hypertension 03/09/2020   Hypokalemia 03/09/2020   Hematochezia    Rectal bleeding    GI bleed 03/05/2018   Encounter for long-term (current) use of other medications 09/25/2017   Chronic diarrhea 09/11/2017   CAD in native artery 11/08/2015   Other long term (current) drug therapy 11/08/2015   Elevated fasting blood sugar 11/08/2015   Generalized anxiety disorder 12/07/2014   Back ache 02/14/2014   Scoliosis 05/27/2012   BP (high blood pressure) 11/20/2011   HLD (hyperlipidemia) 11/20/2011    Orientation RESPIRATION BLADDER Height & Weight     Self, Time, Situation, Place  O2 (Nasal cannula 2L) Incontinent, Continent Weight: 158 lb 4.6 oz (71.8 kg) Height:  5\' 3"  (160  cm)  BEHAVIORAL SYMPTOMS/MOOD NEUROLOGICAL BOWEL NUTRITION STATUS      Incontinent Diet (no added salt/heart healthy diet)  AMBULATORY STATUS COMMUNICATION OF NEEDS Skin   Limited Assist Verbally Normal                       Personal Care Assistance Level of Assistance  Bathing, Feeding, Dressing Bathing Assistance: Limited assistance Feeding assistance: Limited assistance Dressing Assistance: Limited assistance     Functional Limitations Info    Sight Info: Adequate        SPECIAL CARE FACTORS FREQUENCY  PT (By licensed PT), OT (By licensed OT)     PT Frequency: 5xs per week OT Frequency: 5xs per week            Contractures Contractures Info: Not present    Additional Factors Info  Code Status, Allergies, Insulin Sliding Scale Code Status Info: DNR Allergies Info: penicillis and sulfa antibiotics   Insulin Sliding Scale Info: sliding scale       Current Medications (07/26/2021):  This is the current hospital active medication list Current Facility-Administered Medications  Medication Dose Route Frequency Provider Last Rate Last Admin   acetaminophen (TYLENOL) tablet 325-650 mg  325-650 mg Oral Q4H PRN Bary Leriche, PA-C   650 mg at 07/24/21 1512   alum & mag hydroxide-simeth (MAALOX/MYLANTA) 200-200-20 MG/5ML suspension 30 mL  30 mL Oral Q4H PRN Love, Pamela S, PA-C       amiodarone (PACERONE) tablet 200 mg  200 mg Oral Daily Love, Ivan Anchors, PA-C  200 mg at 07/26/21 0815   amLODipine (NORVASC) tablet 2.5 mg  2.5 mg Oral Daily Raulkar, Clide Deutscher, MD   2.5 mg at 07/26/21 0815   bacitracin ointment   Topical PRN Love, Ivan Anchors, PA-C       bisacodyl (DULCOLAX) suppository 10 mg  10 mg Rectal Daily Bary Leriche, PA-C   10 mg at 07/26/21 0815   clonazePAM (KLONOPIN) disintegrating tablet 0.25 mg  0.25 mg Oral BID PRN Meredith Staggers, MD       clonazePAM Bobbye [place]) tablet 0.5 mg  0.5 mg Oral BID Love, Pamela S, PA-C   0.5 mg at 07/26/21 0815   diphenhydrAMINE  (BENADRYL) 12.5 MG/5ML elixir 12.5-25 mg  12.5-25 mg Oral Q6H PRN Love, Pamela S, PA-C       escitalopram (LEXAPRO) tablet 5 mg  5 mg Oral q morning Love, Pamela S, PA-C   5 mg at 07/26/21 1044   guaiFENesin-dextromethorphan (ROBITUSSIN DM) 100-10 MG/5ML syrup 5-10 mL  5-10 mL Oral Q6H PRN Bary Leriche, PA-C       MEDLINE mouth rinse  15 mL Mouth Rinse BID Bary Leriche, PA-C   15 mL at 07/25/21 2052   melatonin tablet 3 mg  3 mg Oral QHS Raulkar, Clide Deutscher, MD   3 mg at 07/25/21 2050   metoprolol tartrate (LOPRESSOR) tablet 25 mg  25 mg Oral BID Love, Pamela S, PA-C   25 mg at 07/26/21 0815   pantoprazole (PROTONIX) EC tablet 40 mg  40 mg Oral QHS Bary Leriche, PA-C   40 mg at 07/25/21 2051   polyethylene glycol (MIRALAX / GLYCOLAX) packet 17 g  17 g Oral Daily Bary Leriche, PA-C   17 g at 07/26/21 0815   polyethylene glycol (MIRALAX / GLYCOLAX) packet 17 g  17 g Oral Daily PRN Love, Pamela S, PA-C       prochlorperazine (COMPAZINE) tablet 5-10 mg  5-10 mg Oral Q6H PRN Love, Pamela S, PA-C       Or   prochlorperazine (COMPAZINE) injection 5-10 mg  5-10 mg Intramuscular Q6H PRN Love, Pamela S, PA-C       Or   prochlorperazine (COMPAZINE) suppository 12.5 mg  12.5 mg Rectal Q6H PRN Love, Pamela S, PA-C       sodium phosphate (FLEET) 7-19 GM/118ML enema 1 enema  1 enema Rectal Once PRN Love, Pamela S, PA-C       STUDY - ASPIRE - apixaban 5 mg or placebo tablet (PI-Sethi)  5 mg Oral BID Garvin Fila, MD   5 mg at 07/26/21 [id number]   And   STUDY - ASPIRE - aspirin 81 mg or placebo tablet (PI-Sethi)  81 mg Oral Daily Garvin Fila, MD   81 mg at 07/26/21 0815   traMADol (ULTRAM) tablet 50 mg  50 mg Oral Q6H PRN Love, Pamela S, PA-C       traZODone (DESYREL) tablet 25-50 mg  25-50 mg Oral QHS PRN Bary Leriche, PA-C         Discharge Medications: Please see discharge summary for a list of discharge medications.  Relevant Imaging Results:  Relevant Lab Results:   Additional  Information SSN: [id number]. JANSSEN COVID-19 VACCINE 03/17/2020,  Moderna COVID-19 Vaccine 01/04/2021,  Pfizer COVID-19 Vaccine 03/16/2020  Rana Snare, LCSW

## 2021-07-26 NOTE — Progress Notes (Signed)
Physical Therapy Session Note  Patient Details  Name: Michelle Bauer MRN: [id number] Date of Birth: [birth year]  Today's Date: 07/26/2021 PT Individual Time: 1400-1445 PT Individual Time Calculation (min): 45 min   Short Term Goals: Week 2:  PT Short Term Goal 1 (Week 2): Pt will improve trunk strength and sitting balance in order to sit unsupported with MinA for 2 minutes. PT Short Term Goal 2 (Week 2): Pt will perform sit<>stand with pull-to-stand technique with consistent mod A consistently. PT Short Term Goal 3 (Week 2): Pt will perform bed<>chair transfers with max A using LRAD.  Skilled Therapeutic Interventions/Progress Updates:     Patient in TIS w/c with significant L trunk lean and her daughter in the room upon PT arrival. Patient alert and agreeable to PT session. Patient denied pain during session. Noted patient's hips shifted far to the R of the TIS w/c, will continue to assess patient's positioning and sitting tolerance. Patient reports increased fatigue with afternoon and requests to return to bed.  Therapeutic Activity: Bed Mobility: Patient performed sit to supine with mod A +2. Provided verbal cues for lowering to her R elbow for trunk control and bringing knees to chest to bring her legs onto the bed and roll onto her back. Transfers: Patient performed a squat pivot transfer TIS w/c>bed with mod-max A to the L. Provided cues for hand placement and head-hips relationship for proper technique and decreased assist with transfers.   Neuromuscular Re-ed: Patient performed the following sitting balance and L hemi-body motor control activities: -sitting balance EOB >5 min focused on L trunk elongation progressing to performing lateral lean to the R to her elbow and returning to midline with min-mod A -supine L elbow and finger flexion/extension with multimodal facilitation, L shoulder flexion/abduction to 90 deg PROM, all 2x5 -supine L knee/hip flexion/extension, L hip abd/add,  and ankle DF/PF with multimodal facilitation and notable muscle activation, extensors>flexors, all 2x5  Patient performed motor control activities to max reps and was unable to produce muscle activation at end of repetitions due to fatigue.  Discussed SNF placement and benefits of increased time for recovery. Educated on stroke recovery and reduced speed of recovery with increased age, however, with time recovery of function is still possible. Patient minimally engaged in the conversation, however attentive to education.  Patient in bed with her daughter at bedside at end of session with breaks locked, bed alarm set, and all needs within reach. Patient missed 15 min of skilled PT due to fatigue, RN made aware. Will attempt to make-up missed time as able.    Therapy Documentation Precautions:  Precautions Precautions: Fall Precaution Comments: Dense L hemi with absent sensation, severe scoliosis, 2L 02, possible L shoulder sublux Restrictions Weight Bearing Restrictions: No General: PT Amount of Missed Time (min): 15 Minutes PT Missed Treatment Reason: Patient fatigue   Therapy/Group: Individual Therapy  [name] PT, DPT  07/26/2021, 3:59 PM

## 2021-07-27 NOTE — Progress Notes (Signed)
Physical Therapy Session Note  Patient Details  Name: Michelle Bauer MRN: [id number] Date of Birth: 08/17/1940  Today's Date: 07/27/2021 PT Individual Time: 1415-1525 PT Individual Time Calculation (min): 70 min   Short Term Goals: Week 2:  PT Short Term Goal 1 (Week 2): Pt will improve trunk strength and sitting balance in order to sit unsupported with MinA for 2 minutes. PT Short Term Goal 2 (Week 2): Pt will perform sit<>stand with pull-to-stand technique with consistent mod A consistently. PT Short Term Goal 3 (Week 2): Pt will perform bed<>chair transfers with max A using LRAD.  Skilled Therapeutic Interventions/Progress Updates:      Pt received supine in bed to start session. Daughter at the bedside. Pt on 2L oxygen throughout session. She reports some unrated R back pain, unable provide descriptors, intensity, or specific locations. Treatment to tolerance with rest breaks provided for pain management. Donned tennis shoes with totalA for time.   Completed supine<>sit with totalA (pt <25%) with HOB flat. Requires maxA for static sitting balance due to L truncal lean and significant scoliosis and thoracic kyphosis. Completed lateral scooting along EOB with totalA (pt<25%) and squat<>pivot transfer with totalA with +2 assist for stabilizing the wheelchair. Pt required totalA for repositioning in TIS w/c and provided pillow for lateral support due to persistent L lean.   Transported in w/c downstairs to 71M ortho rehab gym and wheeled inside standing frame. Completed dependent stand in frame with +2 assist for safety for trunk support and managing paretic LUE. Standing trial for 5 minutes for pressure relief, hip flexor stretching, and truncal extension - used mirror for visual feedback. 2nd standing trial used BITS system with program for visual scanning with user paced target reaching to promote forward weight shifting, glut activation, and reaching outside BOS. 3rd standing trial introduced  mini-squats in standing frame within very small ROM to promote glut activation and hip extension - completed 2x10.   Noted pt to be incontinent of bowel and bladder at this point of session. Transported back upstairs in TIS w/c and completed squat<>pivot transfer with maxA back to bed. Required totalA for sit>supine. Rolling in bed with maxA towards her L and +2 totalA to her R. Small BM and brief change and pericare completed at bed level.   Pt remained supine in bed at conclusion of session. Made comfortable with pillows supporting LUE. Daughter at bedside. Bed alarm on, all needs in reach.     Therapy Documentation Precautions:  Precautions Precautions: Fall Precaution Comments: Dense L hemi with absent sensation, severe scoliosis, 2L 02, possible L shoulder sublux Restrictions Weight Bearing Restrictions: No General:    Therapy/Group: Individual Therapy  Orrin Brigham 07/27/2021, 3:29 PM

## 2021-07-27 NOTE — Progress Notes (Incomplete)
Physical Therapy Session Note  Patient Details  Name: Michelle Bauer MRN: [id number] Date of Birth: 03-04-1941  {CHL IP REHAB PT TIME CALCULATION:[id number]}  Short Term Goals: Week 2:  PT Short Term Goal 1 (Week 2): Pt will improve trunk strength and sitting balance in order to sit unsupported with MinA for 2 minutes. PT Short Term Goal 2 (Week 2): Pt will perform sit<>stand with pull-to-stand technique with consistent mod A consistently. PT Short Term Goal 3 (Week 2): Pt will perform bed<>chair transfers with max A using LRAD.  Skilled Therapeutic Interventions/Progress Updates:     Pt received supine in bed and agrees to therapy. No report of pain. Just reports, "Not being able to move.". Pt performs bilateral rolling, minA to the L, maxA to the R, to assist with donning pants. MaxA for supine to sit and cues for body mechanics and sequencing. Pt sits at EOB, requiring frequent modA to maintain sitting balance, due primarily to LOBs posteriorly but also to the L due to pushing with R hand. Pt is able to maintain static sitting balance up to 30 seconds at a time with CGA. PT provides totalA to don socks and shoes. MaxA for stand pivot to [facility] to the L with cues for upright gaze to improve posture and balance, sequencing, and positioning. WC transport to gym for time management.  Pt attempts ambulation with R hand rail in hallway. Sit to stand with maxA. Pt is extremely forward flexed in standing and has difficulty extending hips or trunk. PT attempts to provide manual facilitation of upright posture and pt has several instance of buckling in L knee. Activity adjusted due to difficulty of task.  Pt performs stand pivot transfer to mat table with maxA and cues for body mechanics and sequencing. Pt works on sitting balance in short sitting on edge of mat with mirror for visual feedback. PT provides cues for hand placement and posture, as pt tends to push firmly to the L. Pt able to maintain upright  balance for ~30 seconds at a time but loses balance consistently and requires mod to maxA to return to midline. Pt performs sit to stand with modA +2 and has improved posture relative to previous attempt, with PT blocking L knee and facilitating lateral weight shifting.   Stand pivot back to WC with maxA. Left sated in tilt in space with alarm intact and all needs within reach.  Therapy Documentation Precautions:  Precautions Precautions: Fall Precaution Comments: Dense L hemi with absent sensation, severe scoliosis, 2L 02, possible L shoulder sublux Restrictions Weight Bearing Restrictions: No   Therapy/Group: Individual Therapy  Beau Fanny 07/27/2021, 9:05 AM

## 2021-07-27 NOTE — Progress Notes (Signed)
Occupational Therapy Session Note  Patient Details  Name: Michelle Bauer MRN: [id number] Date of Birth: 1940-06-23  Today's Date: 07/27/2021 OT Individual Time: 1001-1057 OT Individual Time Calculation (min): 56 min    Short Term Goals: Week 2:  OT Short Term Goal 1 (Week 2): Pt will complete toilet transfer with +1 assist using LRAD OT Short Term Goal 2 (Week 2): Pt will recall hemi dressing techniques with no more than min cueing OT Short Term Goal 3 (Week 2): Pt will require no more than mod cueing for positioning of her LUE at rest OT Short Term Goal 4 (Week 2): Pt will sit EOB with no more than mod A  Skilled Therapeutic Interventions/Progress Updates:  Pt greeted seated in TIS agreeable to OT intervention. Session focus on BADL reeducation, functional mobility, dynamic sitting balance and decreasing overall caregiver burden. Pt reports need to void b/b. Utilized stedy for toilet transfer to [facility] d/t urgency. Pt needed total A for 3/3 toileting tasks. MAX A to stand in stedy for hygiene and clothing mgmt. Pt with recurrent BM episodes and + urine void needing increased time on toilet. +2 help arrived with pt needed MAX A +2 to assist while standing while tech completed posterior pericare. Pt transported to bed with total A from stedy. Worked on static and dynamic sitting balance from EOB. With pt needing up to MOD A at times for dynamic balance when pt reaching out of BOS with RUE and cued to return to midline.pt with moments of CGA for static sitting balance with RUE supported on techs leg. OTA completed PROM streching to LUE, noted trace activation in digits and elbow extension. Pt returned to supine with MAX A +2. +2 to reposition in bed.  pt left supine in bed with bed alarm activated and all needs within reach.                      Pt on 2L [place] during session with SpO2 ranging from 92-98%  Therapy Documentation Precautions:  Precautions Precautions: Fall Precaution Comments: Dense L  hemi with absent sensation, severe scoliosis, 2L 02, possible L shoulder sublux Restrictions Weight Bearing Restrictions: No  Pain: unrated R shoulder pain, rest breaks and repositioning provided as needed    Therapy/Group: Individual Therapy  [facility] 07/27/2021, 12:07 PM

## 2021-07-28 LAB — BASIC METABOLIC PANEL
Anion gap: 8 (ref 5–15)
BUN: 33 mg/dL — ABNORMAL HIGH (ref 8–23)
CO2: 27 mmol/L (ref 22–32)
Calcium: 8.9 mg/dL (ref 8.9–10.3)
Chloride: 105 mmol/L (ref 98–111)
Creatinine, Ser: 1.06 mg/dL — ABNORMAL HIGH (ref 0.44–1.00)
GFR, Estimated: 53 mL/min — ABNORMAL LOW (ref >60)
Glucose, Bld: 105 mg/dL — ABNORMAL HIGH (ref 70–99)
Potassium: 3.9 mmol/L (ref 3.5–5.1)
Sodium: 140 mmol/L (ref 135–145)

## 2021-07-28 LAB — CBC
HCT: 33.5 % — ABNORMAL LOW (ref 36.0–46.0)
Hemoglobin: 10.6 g/dL — ABNORMAL LOW (ref 12.0–15.0)
MCH: 29.9 pg (ref 26.0–34.0)
MCHC: 31.6 g/dL (ref 30.0–36.0)
MCV: 94.6 fL (ref 80.0–100.0)
Platelets: 243 10*3/uL (ref 150–400)
RBC: 3.54 MIL/uL — ABNORMAL LOW (ref 3.87–5.11)
RDW: 13.3 % (ref 11.5–15.5)
WBC: 6.1 10*3/uL (ref 4.0–10.5)
nRBC: 0 % (ref 0.0–0.2)

## 2021-07-28 NOTE — Progress Notes (Signed)
Physical Therapy Session Note  Patient Details  Name: Michelle Bauer MRN: [id number] Date of Birth: 03-30-1941  Today's Date: 07/28/2021 PT Individual Time: 1003-1059 PT Individual Time Calculation (min): 56 min   Short Term Goals: Week 2:  PT Short Term Goal 1 (Week 2): Pt will improve trunk strength and sitting balance in order to sit unsupported with MinA for 2 minutes. PT Short Term Goal 2 (Week 2): Pt will perform sit<>stand with pull-to-stand technique with consistent mod A consistently. PT Short Term Goal 3 (Week 2): Pt will perform bed<>chair transfers with max A using LRAD.  Skilled Therapeutic Interventions/Progress Updates:     Pt received supine in bed and agrees to therapy. No complaint of pain. Pt performs supine to sit with modA and cues for logrolling and sequencing. Sit to stand and stand pivot transfer to [facility] with maxA and heavy facilitation of anterior weight shift and blocking of L knee. WC transport to gym for time management. Pt performs gait training along R hall rail. Sit to stand with modA and blocking of L knee, with cues for body mechanics and sequencing. Pt ambulates x10' with heavy maxA +2, with PT pushing strongly to the L with R hand and requiring frequent cues to mitigate pushing. PT provides totalA to manage L lower extremity and rehab tech assist with pt's trunk due to significant L lean.   Pt performs multiple reps of sit to stand with mirror provided for visual feedback. Pt able to complete with modA and cues to utilize mirror to achieve midline posture and cervical and thoracic extension. Pt remainds standing with mod to maxA +2 and performs lateral weight shifting as well as 3x10 R knee bends to provide NMR and limit tendency to push heavily with R lower and upper extremity. WC transport back to room. Stand pivot transfer to bed with maxA. Sit to supine with totalA. Left with alarm intact and all needs within reach.  Therapy Documentation Precautions:   Precautions Precautions: Fall Precaution Comments: Dense L hemi with absent sensation, severe scoliosis, 2L 02, possible L shoulder sublux Restrictions Weight Bearing Restrictions: No   Therapy/Group: Individual Therapy  Beau Fanny, PT, DPT 07/28/2021, 5:45 PM

## 2021-07-28 NOTE — Progress Notes (Signed)
Occupational Therapy Session Note  Patient Details  Name: Michelle Bauer MRN: [id number] Date of Birth: [birth year]  Today's Date: 07/28/2021 OT Individual Time: 1761-6073 OT Individual Time Calculation (min): 54 min    Short Term Goals: Week 2:  OT Short Term Goal 1 (Week 2): Pt will complete toilet transfer with +1 assist using LRAD OT Short Term Goal 2 (Week 2): Pt will recall hemi dressing techniques with no more than min cueing OT Short Term Goal 3 (Week 2): Pt will require no more than mod cueing for positioning of her LUE at rest OT Short Term Goal 4 (Week 2): Pt will sit EOB with no more than mod A  Skilled Therapeutic Interventions/Progress Updates:  Pt greeted supine in bed reporting being wet but  agreeable to OT intervention. Session focus on BADL reeducation, functional mobility, sitting balance, increasing overall activity tolerance and decreasing overall caregiver burden.  Pt required MIN A to roll to L side and MAX A to roll to R side throughout session for pericare and LB dressing. Pt currently requires total A +2 for LB dressing from bed level to don new brief 2x during session d/t incontinence. MAX A for supine>sit with pt exiting to R side of bed. Pt required MAX A for UB bathing from EOB with pt needing assist to wash LUE. MOD A for UB dressing from EOB. MOD A +2 for sit>lying as reports need to void bowels.  MAX A +2 for toileting on bed pan. -BM. pt left supine in bed with bed alarm activated and all needs within reach.          Pt very fixated on not having enough muscle strength and being upset that she can't control her bladder. Provided therapeutic use of self and active listening.                Pt on 3L [place] with Spo2 >93%  Therapy Documentation Precautions:  Precautions Precautions: Fall Precaution Comments: Dense L hemi with absent sensation, severe scoliosis, 2L 02, possible L shoulder sublux Restrictions Weight Bearing Restrictions: No  Pain: unrated pain  reported during session, provided rest breaks as needed.     Therapy/Group: Individual Therapy  Pollyann [facility] 07/28/2021, 8:58 AM

## 2021-07-28 NOTE — Progress Notes (Signed)
Physical Therapy Session Note  Patient Details  Name: Michelle Bauer MRN: [id number] Date of Birth: Nov 26, 1940  Today's Date: 07/28/2021 PT Individual Time: 1300-1430 PT Individual Time Calculation (min): 90 min   Short Term Goals: Week 1:  PT Short Term Goal 1 (Week 1): Pt will improve trunk strength and sitting balance in order to sit unsupported with MinA for 2 minutes. PT Short Term Goal 1 - Progress (Week 1): Progressing toward goal PT Short Term Goal 2 (Week 1): Pt will perform sit<>supine with MaxA +1. PT Short Term Goal 2 - Progress (Week 1): Met PT Short Term Goal 3 (Week 1): Pt will perform sit<>stand with pull-to-stand technique with consistent MinA. PT Short Term Goal 3 - Progress (Week 1): Progressing toward goal PT Short Term Goal 4 (Week 1): Pt will maintain static standing balance with LRAD and MaxA +1. PT Short Term Goal 4 - Progress (Week 1): Met PT Short Term Goal 5 (Week 1): Pt will perform stand pivot transfer with ModA +2 PT Short Term Goal 5 - Progress (Week 1): Progressing toward goal Week 2:  PT Short Term Goal 1 (Week 2): Pt will improve trunk strength and sitting balance in order to sit unsupported with MinA for 2 minutes. PT Short Term Goal 2 (Week 2): Pt will perform sit<>stand with pull-to-stand technique with consistent mod A consistently. PT Short Term Goal 3 (Week 2): Pt will perform bed<>chair transfers with max A using LRAD. Week 3:    Week 4:     Skilled Therapeutic Interventions/Progress Updates:    Pt initially resting in supine, agreeable to session.  Pt on 3L 02 via [place] throughout session.  Daughter present and assisted throughout session, very helpful and open to education.    Supine to L side to sit w/cues for sequencing, use of rail, mod to max assist.  Initially pushes w/RUE, worked on reaching to R, leaning to R elbow, using therapist for midline visual cues, mod assist fading to min at times. Squat pivot to R w/max assist, second person  guiding R UE to prevent pushing w/transition.  Pt transported to hallway.   Using mirror for feeback, tray table for support/L hand stabilized w/dysom when upright, worked on standing and reaching w/RUE to targets to R of midline using RUE and return to midline between each, initially max assist due to tendency to push w/RUE, blocking of LLE, cues to use mirror for recentering between reaching.    W/repeated efforts, assist required fades to min assist at times/mod assist at times, multimodal cues, blocking of LLE, frequent verbal cues to attend to task and posture.  Engaged in 3-5 min standing activities x 3 w/seated rest between.  Midline sitting balance improved following each standing bout and pt able to sit at midline in wc w/verbal reminders to attend to midline.  Wc to mat to L w/max assist, mat to wc to R w/min assist and second person guiding RUE to prevent pushing.  In sitting, again w/mirror for feedback worked on midline static sitting, then reaching to R w/return to midline w/assist from cga to mod assist and cues for attention to task, easily distracted by activity in environment.    Tolerated 15 min sitting activity.  At end of session, pt transported to room.  Squat pivot to bed w/max assist to L side.  Sit to L side to supine w/cues for sequencing, use of rail, mod assist of 1. Pt incontent of bowels and NT notified of need for  personal care.   Pt left supine w/rails up x 3, alarm set, bed in lowest position, and needs in reach.   Therapy Documentation Precautions:  Precautions Precautions: Fall Precaution Comments: Dense L hemi with absent sensation, severe scoliosis, 2L 02, possible L shoulder sublux Restrictions Weight Bearing Restrictions: No   Therapy/Group: Individual Therapy Callie Fielding, [place] 07/28/2021, 2:47 PM

## 2021-07-28 NOTE — Progress Notes (Signed)
PROGRESS NOTE   Subjective/Complaints: Pt without new issues. Slept well.   ROS: Patient denies fever, rash, sore throat, blurred vision, dizziness, nausea, vomiting, diarrhea, cough, shortness of breath or chest pain, joint or back/neck pain, headache, or mood change.    Objective:   No results found. Recent Labs    07/28/21 0605  WBC 6.1  HGB 10.6*  HCT 33.5*  PLT 243     Recent Labs    07/28/21 0605  NA 140  K 3.9  CL 105  CO2 27  GLUCOSE 105*  BUN 33*  CREATININE 1.06*  CALCIUM 8.9     Intake/Output Summary (Last 24 hours) at 07/28/2021 1022 Last data filed at 07/28/2021 0811 Gross per 24 hour  Intake 238 ml  Output --  Net 238 ml         Physical Exam: Vital Signs Blood pressure (!) 145/52, pulse (!) 52, temperature 97.6 F (36.4 C), temperature source Oral, resp. rate 17, height 5\' 3"  (1.6 m), weight 71.8 kg, SpO2 92 %.  Constitutional: No distress . Vital signs reviewed. HEENT: NCAT, EOMI, oral membranes moist Neck: supple Cardiovascular: RRR without murmur. No JVD    Respiratory/Chest: CTA Bilaterally without wheezes or rales. Normal effort    GI/Abdomen: BS +, non-tender, non-distended Ext: no clubbing, cyanosis, or edema Psych: pleasant and cooperative   Skin: No evidence of breakdown, no evidence of rash Neuro: alert, makes eye contact. Seems more automatic with her language   but continued expressive deficits.  LUE and LLE 1/5 with wrist/finger flexion, didn't see voluntary movement in leg.  Some early flexor tone developing in LUE and extensor tone in LLE. Moves RUE/RLE 4/5.  Sensation 1+/2 on left Musculoskeletal: mild  subluxation left shoulder on exam. Denies pain with ROM of LUE and LLE today    Assessment/Plan: 1. Functional deficits which require 3+ hours per day of interdisciplinary therapy in a comprehensive inpatient rehab setting. Physiatrist is providing close team  supervision and 24 hour management of active medical problems listed below. Physiatrist and rehab team continue to assess barriers to discharge/monitor patient progress toward functional and medical goals  Care Tool:  Bathing    Body parts bathed by patient: Chest, Abdomen, Front perineal area, Buttocks, Right upper leg, Left upper leg, Face, Left arm, Left lower leg   Body parts bathed by helper: Right lower leg, Right arm     Bathing assist Assist Level: Maximal Assistance - Patient 24 - 49%     Upper Body Dressing/Undressing Upper body dressing   What is the patient wearing?: Pull over shirt    Upper body assist Assist Level: Moderate Assistance - Patient 50 - 74%    Lower Body Dressing/Undressing Lower body dressing      What is the patient wearing?: Incontinence brief     Lower body assist Assist for lower body dressing: 2 Helpers (MAX A +2)     Toileting Toileting    Toileting assist Assist for toileting: 2 Helpers     Transfers Chair/bed transfer  Transfers assist  Chair/bed transfer activity did not occur: Safety/medical concerns  Chair/bed transfer assist level: Dependent - Patient 0% Chair/bed transfer assistive device:  Walker   Locomotion Ambulation   Ambulation assist   Ambulation activity did not occur: Safety/medical concerns          Walk 10 feet activity   Assist  Walk 10 feet activity did not occur: Safety/medical concerns        Walk 50 feet activity   Assist Walk 50 feet with 2 turns activity did not occur: Safety/medical concerns         Walk 150 feet activity   Assist Walk 150 feet activity did not occur: Safety/medical concerns         Walk 10 feet on uneven surface  activity   Assist Walk 10 feet on uneven surfaces activity did not occur: Safety/medical concerns         Wheelchair     Assist Is the patient using a wheelchair?: No Type of Wheelchair: Manual Wheelchair activity did not occur:  Safety/medical concerns         Wheelchair 50 feet with 2 turns activity    Assist    Wheelchair 50 feet with 2 turns activity did not occur: Safety/medical concerns       Wheelchair 150 feet activity     Assist  Wheelchair 150 feet activity did not occur: Safety/medical concerns   Assist Level: 2 helpers   Blood pressure (!) 145/52, pulse (!) 52, temperature 97.6 F (36.4 C), temperature source Oral, resp. rate 17, height 5\' 3"  (1.6 m), weight 71.8 kg, SpO2 92 %.  Medical Problem List and Plan: 1. 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  2.  Antithrombotics: -DVT/anticoagulation:  Pharmaceutical: Lovenox             -antiplatelet therapy: N/a due to thalamic bleed.  3. Pain Management: Tylenol or tramadol prn.  4. Insomnia: continue melatonin 3mg  HS for now             -antipsychotic agents: N/A 5. Neuropsych: This patient is not capable of making decisions on her own behalf. 6. Skin/Wound Care: Routine pressure relief measures.  7. Fluids/Electrolytes/Nutrition:               --offering supplements between meals due to low calorie malnutrition (TP-5.6/alb-2.1), family can help too   2/3 po intake has been inconsistent   -prealbumin only 16.8 1/30   -pushing po   -added megace to boost appetite   2/9 intake better but BUN/Cr elevated    -push fluids    -recheck bmet tomorrow 8. A fib: Monitor HR TID--rate control is good/bradycardic- continue amiodarone and metoprolol.  --Off Eliquis till follow up with neurology.  9. HTN: Monitor BP TID--SBP goal, 160. BP labile, continue metoprolol 25 mg bid with amiodarone 200 mg/day. Continue amlodipine 2.5mg .  -HR in 50's--continue same meds for now as bp controlled  10. Hypoxia: Continue 2 L per [place] and wean oxygen as able.              --encourage IS/Flutter valve, oob    -still needs 1-2 L O2 to maintain sats 11. E coli UTI: Completed 5 day course of antibiotic on 01/22.  12. GAD/depression: Manage with Lexapro and Klonopin BID (took QID at home?) per Dr. Elder Love.             -  sleep seems reasonable  -daughter reported klonopin works well for her   -need to be careful not to oversedate    13. ABLA:     -no overt signs of bleeding  -stool guaic pending  -hgb at 11.0  --> 10.6 2/9        LOS: 13 days A FACE TO [place] T Remo Kirschenmann 07/28/2021, 10:22 AM

## 2021-07-29 LAB — BASIC METABOLIC PANEL
Anion gap: 8 (ref 5–15)
BUN: 30 mg/dL — ABNORMAL HIGH (ref 8–23)
CO2: 27 mmol/L (ref 22–32)
Calcium: 9 mg/dL (ref 8.9–10.3)
Chloride: 105 mmol/L (ref 98–111)
Creatinine, Ser: 0.83 mg/dL (ref 0.44–1.00)
GFR, Estimated: >60 mL/min (ref >60)
Glucose, Bld: 99 mg/dL (ref 70–99)
Potassium: 4 mmol/L (ref 3.5–5.1)
Sodium: 140 mmol/L (ref 135–145)

## 2021-07-29 NOTE — Progress Notes (Signed)
PROGRESS NOTE   Subjective/Complaints: Pt without complaints today. Was resting when I came in but woke up easily. Denied pain.   ROS: Limited due to cognitive/behavioral    Objective:   No results found. Recent Labs    07/28/21 0605  WBC 6.1  HGB 10.6*  HCT 33.5*  PLT 243     Recent Labs    07/28/21 0605 07/29/21 0516  NA 140 140  K 3.9 4.0  CL 105 105  CO2 27 27  GLUCOSE 105* 99  BUN 33* 30*  CREATININE 1.06* 0.83  CALCIUM 8.9 9.0     Intake/Output Summary (Last 24 hours) at 07/29/2021 1327 Last data filed at 07/29/2021 1015 Gross per 24 hour  Intake 218 ml  Output --  Net 218 ml         Physical Exam: Vital Signs Blood pressure 135/60, pulse 63, temperature 98 F (36.7 C), resp. rate 16, height 5\' 3"  (1.6 m), weight 71.8 kg, SpO2 98 %.  Constitutional: No distress . Vital signs reviewed. HEENT: NCAT, EOMI, oral membranes moist Neck: supple Cardiovascular: RRR without murmur. No JVD    Respiratory/Chest: CTA Bilaterally without wheezes or rales. Normal effort    GI/Abdomen: BS +, non-tender, non-distended Ext: no clubbing, cyanosis, or edema Psych: pleasant and cooperative  Skin: No evidence of breakdown, no evidence of rash Neuro: alert, makes eye contact. Seems more automatic with her language   but continued expressive deficits.  LUE and LLE 1/5 with wrist/finger flexion, didn't see voluntary movement in leg.  Some early flexor tone developing in LUE and extensor tone in LLE. Moves RUE/RLE 4/5.  Sensation 1+/2 on left Musculoskeletal: mild  subluxation left shoulder on exam. No pain with ROM of LUE and LLE today    Assessment/Plan: 1. Functional deficits which require 3+ hours per day of interdisciplinary therapy in a comprehensive inpatient rehab setting. Physiatrist is providing close team supervision and 24 hour management of active medical problems listed below. Physiatrist and rehab  team continue to assess barriers to discharge/monitor patient progress toward functional and medical goals  Care Tool:  Bathing    Body parts bathed by patient: Chest, Abdomen, Front perineal area, Buttocks, Right upper leg, Left upper leg, Face, Left arm, Left lower leg   Body parts bathed by helper: Right lower leg, Right arm     Bathing assist Assist Level: Maximal Assistance - Patient 24 - 49%     Upper Body Dressing/Undressing Upper body dressing   What is the patient wearing?: Pull over shirt    Upper body assist Assist Level: Moderate Assistance - Patient 50 - 74%    Lower Body Dressing/Undressing Lower body dressing      What is the patient wearing?: Incontinence brief     Lower body assist Assist for lower body dressing: 2 Helpers (MAX A +2)     Toileting Toileting    Toileting assist Assist for toileting: 2 Helpers     Transfers Chair/bed transfer  Transfers assist  Chair/bed transfer activity did not occur: Safety/medical concerns  Chair/bed transfer assist level: Dependent - Patient 0% Chair/bed transfer assistive device: Programmer, multimedia   Ambulation assist  Ambulation activity did not occur: Safety/medical concerns          Walk 10 feet activity   Assist  Walk 10 feet activity did not occur: Safety/medical concerns        Walk 50 feet activity   Assist Walk 50 feet with 2 turns activity did not occur: Safety/medical concerns         Walk 150 feet activity   Assist Walk 150 feet activity did not occur: Safety/medical concerns         Walk 10 feet on uneven surface  activity   Assist Walk 10 feet on uneven surfaces activity did not occur: Safety/medical concerns         Wheelchair     Assist Is the patient using a wheelchair?: No Type of Wheelchair: Manual Wheelchair activity did not occur: Safety/medical concerns         Wheelchair 50 feet with 2 turns activity    Assist     Wheelchair 50 feet with 2 turns activity did not occur: Safety/medical concerns       Wheelchair 150 feet activity     Assist  Wheelchair 150 feet activity did not occur: Safety/medical concerns   Assist Level: 2 helpers   Blood pressure 135/60, pulse 63, temperature 98 F (36.7 C), resp. rate 16, height 5\' 3"  (1.6 m), weight 71.8 kg, SpO2 98 %.  Medical Problem List and Plan: 1. 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  2.  Antithrombotics: -DVT/anticoagulation:  Pharmaceutical: Lovenox             -antiplatelet therapy: N/a due to thalamic bleed.  3. Pain Management: Tylenol or tramadol prn.  4. Insomnia: continue melatonin 3mg  HS for now             -antipsychotic agents: N/A 5. Neuropsych: This patient is not capable of making decisions on her own behalf. 6. Skin/Wound Care: Routine pressure relief measures.  7. Fluids/Electrolytes/Nutrition:               --offering supplements between meals due to low calorie malnutrition (TP-5.6/alb-2.1), family can help too   2/3 po intake has been inconsistent   -prealbumin only 16.8 1/30   -pushing po   -added megace to boost appetite   2/10 intake better    -labs improved today    -continue to encourage PO    -hold off on IVF    -check bmet monday 8. A fib: Monitor HR TID--rate control is good/bradycardic- continue amiodarone and metoprolol.  --Off Eliquis till follow up with neurology.  9. HTN: Monitor BP TID--SBP goal, 160. BP labile, continue metoprolol 25 mg bid with amiodarone 200 mg/day. Continue amlodipine 2.5mg .  -HR in 60's--continue same meds for now as bp controlled  10. Hypoxia: Continue 2 L per [place] and wean oxygen as able.              --encourage IS/Flutter valve, oob   -still needs 1-2 L O2 to maintain sats 11. E coli UTI: Completed 5 day course of antibiotic on  01/22.  12. GAD/depression: Manage with Lexapro and Klonopin PRN(took QID at home?) per Dr. Elder Love.             -  sleep seems reasonable  -pt is rarely using klonopin at this time    13. ABLA:     -no overt signs of bleeding  -stool guaic pending --request again -recheck hgb monday         LOS: 14 days A FACE TO FACE EVALUATION WAS PERFORMED  Meredith Staggers 07/29/2021, 1:27 PM

## 2021-07-29 NOTE — Progress Notes (Addendum)
Patient ID: Michelle Bauer, female   DOB: [birth year], 81 y.o.   MRN: [id number]  07/28/2021- SW spoke with Andrea/Admissions with [facility] who reported there are currently no short term rehab beds, and unsure if there will be any available next week.   SW left message for pt dtr Michelle Bauer [phone]) to follow-up about preferred SNF locations.  *SW met with pt and pt dtr Michelle Bauer in room to review SNF preference. SW shared about updates with regard to [facility]. Would like to know if any facilities are offering long term care options as they would like to avoid transitioning her mother to several places. SW informed will discuss with SNF locations. SW reviewed SNF bed offers. Pt dtr to begin visiting SNF locations. Would like SW to [facility].  1243- SW left message for Kelly/Admissions with [facility] [phone]) to discuss bed offer and if there are any potential LTC bed options.  *Reports able to offer short term rehab bed, however, family would have to private pay for LTC bed due to 1 year waiting list for Medicaid LTC bed. SW informed Michelle Bauer will provide her contact information to the dtr and one of them will follow-up to discuss further.   SW called pt dtr Michelle Bauer to discuss above about SNF. Reports she and her sister Michelle Bauer will be viewing  facility on Monday since administrator is out today. Will f/u after touring facility.  Michelle Bauer, MSW, [place] Office: [phone] Cell: [phone] Fax: [phone]

## 2021-07-29 NOTE — Progress Notes (Signed)
Occupational Therapy Session Note  Patient Details  Name: Michelle Bauer MRN: [id number] Date of Birth: 1941-04-10  Today's Date: 07/29/2021 OT Individual Time: [phone] OT Individual Time Calculation (min): 50 min    Short Term Goals: Week 2:  OT Short Term Goal 1 (Week 2): Pt will complete toilet transfer with +1 assist using LRAD OT Short Term Goal 2 (Week 2): Pt will recall hemi dressing techniques with no more than min cueing OT Short Term Goal 3 (Week 2): Pt will require no more than mod cueing for positioning of her LUE at rest OT Short Term Goal 4 (Week 2): Pt will sit EOB with no more than mod A  Skilled Therapeutic Interventions/Progress Updates:    Pt received supine with no c/o pain, agreeable to OT session. She completed anterior peri hygiene with set up assist and then mod A for posterior hygiene in sidelying. She required mod A to roll L. Max A to come to sitting EOB. With facilitation of proper body mechanics pt was able to sit EOB with mod A. Max A stand pivot to the TIS w/c. UB ADLs at the sink with good initiation and sequencing, still requiring max facilitation to lift the LUE and to ask for assist. Pt was set up to eat breakfast and 10 min missed while she did this. She ended with oral hygiene at the sink, set up assist. Lateral support placed in w/c on the L to support pelvic obliquity and trunk misalignment (as much as possible with scoliosis). She was left sitting up with all needs met, chair alarm set.    Sa[place]o Stim One placed on her L middle and anterior deltoid for joint approximation and muscle activation. 60 min unattended. No c/o pain or adverse skin reactions.  330 pulse width 35 Hz pulse rate On 8 sec/ off 8 sec Ramp up/ down 2 sec Symmetrical Biphasic wave form  Max intensity 136m at 500 Ohm load   Therapy Documentation Precautions:  Precautions Precautions: Fall Precaution Comments: Dense L hemi with absent sensation, severe scoliosis, 2L 02,  possible L shoulder sublux Restrictions Weight Bearing Restrictions: No     Therapy/Group: Individual Therapy  SCurtis Sites2/03/2022, 6:35 AM

## 2021-07-29 NOTE — Progress Notes (Signed)
Physical Therapy Session Note  Patient Details  Name: Michelle Bauer MRN: [id number] Date of Birth: [birth year]  Today's Date: 07/29/2021 PT Individual Time: 1419-1530 PT Individual Time Calculation (min): 71 min   Short Term Goals: Week 2:  PT Short Term Goal 1 (Week 2): Pt will improve trunk strength and sitting balance in order to sit unsupported with MinA for 2 minutes. PT Short Term Goal 2 (Week 2): Pt will perform sit<>stand with pull-to-stand technique with consistent mod A consistently. PT Short Term Goal 3 (Week 2): Pt will perform bed<>chair transfers with max A using LRAD.  Skilled Therapeutic Interventions/Progress Updates: Pt presents supine in bed w/ dtr present and getting ready for therapy session.  Pt rolled to left w/ mod A w/ verbal cues for flexed knee and reaching across, max A to right to complete donning of pants.  Pt required max A for R sidelying to sit.  Pt required verbal and manual cueing for attaining mid-line seated position (pt is scoliotic so does not look midline, but upright and looks good per dtr).  Pt performed sit to stand w/ max A and blocking L knee for SPT to TIS.  Pt wheeled to main gym for use of full-length mirror for visual input.  Pt able to self-correct posture w/ use of mirror.  Pt performed multiple sit to stand transfers w/ A + 2 and verbal cues.  Pt able to step forward x 5 steps w/ A + 2 and blocking of L knee and facilitation for wt shift and verbal cues for sequencing.  Pt fatigued from AM session, but still willing to work.  Pt required extended seated rest breaks between trials.  Pt performed sit to stand w/ max A and SPT w/c > bed.  Pt performed sitting to sidelying w/ A + 2 w/ verbal cueing for sequencing.  Pt remained in bed w/ bed alarm on and all needs in reach.     Therapy Documentation Precautions:  Precautions Precautions: Fall Precaution Comments: Dense L hemi with absent sensation, severe scoliosis, 2L 02, possible L shoulder  sublux Restrictions Weight Bearing Restrictions: No General: PT Amount of Missed Time (min): 15 Minutes PT Missed Treatment Reason: Toileting Vital Signs: Therapy Vitals Temp: 98.2 F (36.8 C) Temp Source: Oral Pulse Rate: 64 Resp: 17 BP: (!) 136/56 Patient Position (if appropriate): Lying Oxygen Therapy SpO2: 96 % O2 Device: Nasal Cannula Pain:0/10 Pain Assessment Pain Scale: 0-10 Pain Score: 0-No pain Mobility:      Therapy/Group: Individual Therapy  Lucio Edward 07/29/2021, 3:42 PM

## 2021-07-29 NOTE — Plan of Care (Signed)
°  Problem: Consults Goal: RH STROKE PATIENT EDUCATION Description: See Patient Education module for education specifics  Outcome: Progressing   Problem: RH BOWEL ELIMINATION Goal: RH STG MANAGE BOWEL WITH ASSISTANCE Description: STG Manage Bowel with mod I  Assistance. Outcome: Progressing Goal: RH STG MANAGE BOWEL W/MEDICATION W/ASSISTANCE Description: STG Manage Bowel with Medication with mod I  Assistance. Outcome: Progressing   Problem: RH BLADDER ELIMINATION Goal: RH STG MANAGE BLADDER WITH ASSISTANCE Description: STG Manage Bladder With min Assistance Outcome: Progressing   Problem: RH SKIN INTEGRITY Goal: RH STG SKIN FREE OF INFECTION/BREAKDOWN Description: With min assist Outcome: Progressing   Problem: RH SAFETY Goal: RH STG ADHERE TO SAFETY PRECAUTIONS W/ASSISTANCE/DEVICE Description: STG Adhere to Safety Precautions With supervision Assistance/Device. Outcome: Progressing   Problem: RH PAIN MANAGEMENT Goal: RH STG PAIN MANAGED AT OR BELOW PT'S PAIN GOAL Description: At or below level 4 with prn meds Outcome: Progressing   Problem: RH KNOWLEDGE DEFICIT Goal: RH STG INCREASE KNOWLEDGE OF HYPERTENSION Description: Patient's caregiver will be able to manage HTN with medications and dietary modifications using handouts and educational materials inconveniently Outcome: Progressing Goal: RH STG INCREASE KNOWLEGDE OF HYPERLIPIDEMIA Description: Patient's caregiver will be able to manage HLD with medications and dietary modifications using handouts and educational materials inconveniently Outcome: Progressing   Problem: RH KNOWLEDGE DEFICIT BRAIN INJURY Goal: RH STG INCREASE KNOWLEDGE OF SELF CARE AFTER BRAIN INJURY Description: Patient's caregiver will be able to manage care with medications and dietary modifications using handouts and educational materials inconveniently Outcome: Progressing

## 2021-07-29 NOTE — Progress Notes (Signed)
Physical Therapy Session Note  Patient Details  Name: Michelle Bauer MRN: [id number] Date of Birth: 07/31/1940  Today's Date: 07/29/2021 PT Individual Time: 1015-1100 PT Individual Time Calculation (min): 45 min   Short Term Goals: Week 1:  PT Short Term Goal 1 (Week 1): Pt will improve trunk strength and sitting balance in order to sit unsupported with MinA for 2 minutes. PT Short Term Goal 1 - Progress (Week 1): Progressing toward goal PT Short Term Goal 2 (Week 1): Pt will perform sit<>supine with MaxA +1. PT Short Term Goal 2 - Progress (Week 1): Met PT Short Term Goal 3 (Week 1): Pt will perform sit<>stand with pull-to-stand technique with consistent MinA. PT Short Term Goal 3 - Progress (Week 1): Progressing toward goal PT Short Term Goal 4 (Week 1): Pt will maintain static standing balance with LRAD and MaxA +1. PT Short Term Goal 4 - Progress (Week 1): Met PT Short Term Goal 5 (Week 1): Pt will perform stand pivot transfer with ModA +2 PT Short Term Goal 5 - Progress (Week 1): Progressing toward goal Week 2:  PT Short Term Goal 1 (Week 2): Pt will improve trunk strength and sitting balance in order to sit unsupported with MinA for 2 minutes. PT Short Term Goal 2 (Week 2): Pt will perform sit<>stand with pull-to-stand technique with consistent mod A consistently. PT Short Term Goal 3 (Week 2): Pt will perform bed<>chair transfers with max A using LRAD. Week 3:    Week 4:     Skilled Therapeutic Interventions/Progress Updates:    Denies pain.  Pt on 3L 02 via [place] for full session.  Pt initially w/nursing receiving assistance w/brief change due to bowel incontinence.  15 min missed time due to this.  Pt then oob in wc and agreeable to session.  Daughter accompanies pt to session and assists w/managing equipment during session.  Squat pivot wc to mat w/mod assist.  In sitting worked on midline sitting posture using mirror for feedback and multimodal cues due to tendency to push  w/RUE/lean to L and posteriorly.  Sit to stand w/+31mx assist and mirror for feedback.  Pt w/difficulty achieving midline w/skilled PT on pt L, PT tech on R.  Second PT then assists and pt able to stand at midline w/mod assist, therapist blocking LLE, cues to prevent pushing w/RUE, multimodal cues and mirror for feeback, improved midline.  Progressed from standing to gait x 55fw/max assist to advance LLE thru swing, mod ato max assist ssist and max cues to maintain midline/wt shifting, daughter assisted by rolling mirror in front of pt which pt utilized 100% time during gait for feedback., "3 muskateers" assist provided for gait.  Pt transported back to room.  Squat pivot wc to bed w/mod assist and daughter guides RUE during session to facilitate wt shift and prevent pushing.  Sit to supine w/mod assist and max cues.  Pt left supine w/rails up x 3, alarm set, bed in lowest position, and needs in reach.  Therapy Documentation Precautions:  Precautions Precautions: Fall Precaution Comments: Dense L hemi with absent sensation, severe scoliosis, 2L 02, possible L shoulder sublux Restrictions Weight Bearing Restrictions: No   Therapy/Group: Individual Therapy BaCallie FieldingPTFairview/03/2022, 2:55 PM

## 2021-07-30 NOTE — Plan of Care (Signed)
°  Problem: Consults °Goal: RH STROKE PATIENT EDUCATION °Description: See Patient Education module for education specifics  °Outcome: Progressing °  °Problem: RH BOWEL ELIMINATION °Goal: RH STG MANAGE BOWEL WITH ASSISTANCE °Description: STG Manage Bowel with mod I  Assistance. °Outcome: Progressing °Goal: RH STG MANAGE BOWEL W/MEDICATION W/ASSISTANCE °Description: STG Manage Bowel with Medication with mod I  Assistance. °Outcome: Progressing °  °Problem: RH BLADDER ELIMINATION °Goal: RH STG MANAGE BLADDER WITH ASSISTANCE °Description: STG Manage Bladder With min Assistance °Outcome: Progressing °  °Problem: RH SKIN INTEGRITY °Goal: RH STG SKIN FREE OF INFECTION/BREAKDOWN °Description: With min assist °Outcome: Progressing °  °Problem: RH SAFETY °Goal: RH STG ADHERE TO SAFETY PRECAUTIONS W/ASSISTANCE/DEVICE °Description: STG Adhere to Safety Precautions With supervision Assistance/Device. °Outcome: Progressing °  °Problem: RH PAIN MANAGEMENT °Goal: RH STG PAIN MANAGED AT OR BELOW PT'S PAIN GOAL °Description: At or below level 4 with prn meds °Outcome: Progressing °  °Problem: RH KNOWLEDGE DEFICIT °Goal: RH STG INCREASE KNOWLEDGE OF HYPERTENSION °Description: Patient's caregiver will be able to manage HTN with medications and dietary modifications using handouts and educational materials inconveniently °Outcome: Progressing °Goal: RH STG INCREASE KNOWLEGDE OF HYPERLIPIDEMIA °Description: Patient's caregiver will be able to manage HLD with medications and dietary modifications using handouts and educational materials inconveniently °Outcome: Progressing °  °Problem: RH KNOWLEDGE DEFICIT BRAIN INJURY °Goal: RH STG INCREASE KNOWLEDGE OF SELF CARE AFTER BRAIN INJURY °Description: Patient's caregiver will be able to manage care with medications and dietary modifications using handouts and educational materials inconveniently °Outcome: Progressing °  °

## 2021-07-30 NOTE — Progress Notes (Signed)
Physical Therapy Session Note  Patient Details  Name: Michelle Bauer MRN: [id number] Date of Birth: April 04, 1941  Today's Date: 07/30/2021 PT Individual Time: 5027-7412 PT Individual Time Calculation (min): 61 min   Short Term Goals: Week 2:  PT Short Term Goal 1 (Week 2): Pt will improve trunk strength and sitting balance in order to sit unsupported with MinA for 2 minutes. PT Short Term Goal 2 (Week 2): Pt will perform sit<>stand with pull-to-stand technique with consistent mod A consistently. PT Short Term Goal 3 (Week 2): Pt will perform bed<>chair transfers with max A using LRAD.  Skilled Therapeutic Interventions/Progress Updates:    Pt received supine in bed on 2L of O2 via nasal cannula with SpO2 maintaining >95% throughout session. Pt agreeable to therapy session.   Supine>sitting R EOB, HOB partially elevated and bedrails available, with total assist for L hemibody management and trunk upright. L squat pivot EOB>TIS w/c heavy mod assist for lifting/pivoting hips and maintaining trunk control - cuing for head/hips relationship - therapist blocking L knee to facilitate WBing.  Transported to/from gym in w/c for time management and energy conservation.  Sit<>stands to/from TIS w/c using R UE support on hallway rail with mirror feedback and light mod assist for lifting/lowering hips and guarding L knee to promote WBing but no knee buckling noticed in standing. In standing, with heavy min assist for balance, pt able to maintain static standing balance with good trunk control and midline orientation with no significant L lean nor pushing noticed.   Gait training 80ft 2x at R hallway with mod assist of 1 for balance and L LE management and +2 w/c follow and managing O2 line - pt able to advance L LE ~25-50% inconsistently during swing requiring assist to fully advance and position - pt demos good L knee control during stance requiring therapist to primarily facilitate increased L hip extension  while blocking L knee hyperextension and facilitating L weight shift during stance - reciprocal pattern throughout with pt taking excessively large R step lengths with cuing to correct. Donned L LE DF assist ACE wrap for 2nd gait trail and for the following.   Gait training 48ft with +2 R HHA and to manage O2 line while 3rd person assist provided w/c follow - continues to require mod assist of 1 for balance and L LE management as described above with slightly more impaired balance noticed compared to at hallway rail and pt relying heavily on R HHA.   Transported back to room and nurse requests therapist assist pt back to bed because earlier today pt was not successful with standing in stedy with +2 assist in order to safely transfer with nursing staff. Pt also reports difficulty standing in stedy.   R squat pivot TIS w/c>EOB with heavy max assist for lifting/pivoting hips and maintaining trunk control in this direction compared to towards L due to pushing and worsening L trunk lean/LOB.  Sit>R sidelying with mod assist for trunk descent and B LE management onto bed. Pt agreeable to remain in R sidelying for sacral pressure relief - therapist placed pillows for therapeutic L hemibody positioning. At end of session, pt left in bed with needs in reach and bed alarm on.    Therapy Documentation Precautions:  Precautions Precautions: Fall Precaution Comments: Dense L hemi with absent sensation, severe scoliosis, 2L 02, possible L shoulder sublux Restrictions Weight Bearing Restrictions: No   Pain:  No reports of pain throughout session.   Therapy/Group: Individual Therapy  Samayah Novinger  Verdell Face , PT, DPT, NCS, CSRS  07/30/2021, 3:27 PM

## 2021-07-31 NOTE — Progress Notes (Signed)
Occupational Therapy Session Note  Patient Details  Name: Michelle Bauer MRN: [id number] Date of Birth: 1940-07-15  Today's Date: 07/31/2021 OT Individual Time: 4715-9539 OT Individual Time Calculation (min): 57 min    Short Term Goals: Week 1:  OT Short Term Goal 1 (Week 1): Pt will complete UB ADL EOB with +2 assist to work on sitting balance OT Short Term Goal 1 - Progress (Week 1): Met OT Short Term Goal 2 (Week 1): Pt will complete 1 grooming task with Mod A using compensatory strategies as needed OT Short Term Goal 2 - Progress (Week 1): Met OT Short Term Goal 3 (Week 1): Pt will complete toilet transfer with 2 assist and LRAD to promote OOB toileting OT Short Term Goal 3 - Progress (Week 1): Met Week 2:  OT Short Term Goal 1 (Week 2): Pt will complete toilet transfer with +1 assist using LRAD OT Short Term Goal 2 (Week 2): Pt will recall hemi dressing techniques with no more than min cueing OT Short Term Goal 3 (Week 2): Pt will require no more than mod cueing for positioning of her LUE at rest OT Short Term Goal 4 (Week 2): Pt will sit EOB with no more than mod A   Skilled Therapeutic Interventions/Progress Updates:    Pt greeted at time of session semireclined bed level agreeable to OT session, no pain reported. Daughter present at beginning of session but did not remain. Pt agreeable to OOB activity, supine > sit Mod/Max and able to sit EOB Mod fading to CGA for sitting balance. L lean noted but able to correct with verbal cues and increasing body awareness by paying attention to her environment. Donned shoes sitting EOB with therapist assist to thread and encouraged pt to push down for input. Stedy transfer bed > wheelchair with therapist assist to manage LUE on bar. Set up at sink to brush teeth per pt request, significant lean noted when reaching for objects, again verbal and visual cues with mirror for feedback for posture. Educated on hemitechniques for brushing teeth and some  carryover noted. Switched to portable O2, remained on 2L throughout. Transported room <> ortho gym and set up with task of whiping off # 1-10 on mirror, difficulty anterior weight shifting to reach or could be 2/2 scoliosis, with weightbearing through LUE while reaching with RUE. Attempted activity to use LUE grossly to knock over light weight cups - trace activation noted in 5th digit but no other active movement. Transported back to room alarm on call bell in reach.  Therapy Documentation Precautions:  Precautions Precautions: Fall Precaution Comments: Dense L hemi with absent sensation, severe scoliosis, 2L 02, possible L shoulder sublux Restrictions Weight Bearing Restrictions: No      Therapy/Group: Individual Therapy  Viona Gilmore 07/31/2021, 1:13 PM

## 2021-07-31 NOTE — Progress Notes (Signed)
Vitals checked at 0400 and HR between 47-52 bpm.   0500 vitals rechecked HR currently 52 and BP has reduced slightly. [place] is currently green and 0.

## 2021-07-31 NOTE — Progress Notes (Addendum)
Physical Therapy Session Note  Patient Details  Name: Michelle Bauer MRN: [id number] Date of Birth: 08-18-1940  Today's Date: 07/31/2021 PT Individual Time: [id number] PT Individual Time Calculation (min): 40 min   Short Term Goals: Week 2:  PT Short Term Goal 1 (Week 2): Pt will improve trunk strength and sitting balance in order to sit unsupported with MinA for 2 minutes. PT Short Term Goal 2 (Week 2): Pt will perform sit<>stand with pull-to-stand technique with consistent mod A consistently. PT Short Term Goal 3 (Week 2): Pt will perform bed<>chair transfers with max A using LRAD.  Skilled Therapeutic Interventions/Progress Updates:     Patient in [place] w/c, handed off from [place], [place] upon PT arrival. Patient alert and agreeable to PT session. Patient reported mild-moderate back pain during session, RN made aware. PT provided repositioning, rest breaks, and distraction as pain interventions throughout session.   Patient expressed concerns about her loss of function and frustrations with incontinence to PT. Reports that her goals are to walk into the bathroom and be independent at home again. PT initiated a lengthy discussion for management of expectations during her recovery. Patient became tearful during discussion, expressed concerns for placing a high burden of care on her daughters. Reiterated previous education about increased age and recovery from stroke requiring increased time. Provided encouragement of increased time and skilled therapies at SNF to reduce the burden of care and improve patient's quality of life prior to d/c. Patient continued to express fears and sadness about her current situation. Discussed benefits of Palliative care and asked patient if she would be interested in a consult with them, patient declined at this time, as she has not had these discussions with her daughters yet. Encouraged patient to initiate discussions about her feelings with her family and to let any  staff know if she changes her mind about meeting with Palliative care, patient very appreciative.   Patient uncomfortable in the TIS w/c and requested to return to bed. Patient performed a lateral scoot transfer TIS w/c>bed with max A with increased pushing with R hand on bed rail despite cues to reduce pushing and let go of the bed rail. Patient performed sitting to side-lying with max A for lower extremity management. Patient became incontinent of bladder and visibly upset about this. PT comforted patient and handed off to NT for peri-care. Will discuss toileting with rehab team to try to improve toilet transfers and toileting frequency to help manage patient's challenges with incontinence.   Patient in R side-lying in bed with NT in the room at end of session with breaks locked, bed alarm set, and all needs within reach.   Therapy Documentation Precautions:  Precautions Precautions: Fall Precaution Comments: Dense L hemi with absent sensation, severe scoliosis, 2L 02, possible L shoulder sublux Restrictions Weight Bearing Restrictions: No    Therapy/Group: Individual Therapy  Michelle Bauer PT, DPT  07/31/2021, 4:41 PM

## 2021-07-31 NOTE — Plan of Care (Signed)
°  Problem: Consults Goal: RH STROKE PATIENT EDUCATION Description: See Patient Education module for education specifics  Outcome: Progressing   Problem: RH BOWEL ELIMINATION Goal: RH STG MANAGE BOWEL WITH ASSISTANCE Description: STG Manage Bowel with mod I  Assistance. Outcome: Progressing Goal: RH STG MANAGE BOWEL W/MEDICATION W/ASSISTANCE Description: STG Manage Bowel with Medication with mod I  Assistance. Outcome: Progressing   Problem: RH BLADDER ELIMINATION Goal: RH STG MANAGE BLADDER WITH ASSISTANCE Description: STG Manage Bladder With min Assistance Outcome: Progressing   Problem: RH SKIN INTEGRITY Goal: RH STG SKIN FREE OF INFECTION/BREAKDOWN Description: With min assist Outcome: Progressing   Problem: RH SAFETY Goal: RH STG ADHERE TO SAFETY PRECAUTIONS W/ASSISTANCE/DEVICE Description: STG Adhere to Safety Precautions With supervision Assistance/Device. Outcome: Progressing   Problem: RH PAIN MANAGEMENT Goal: RH STG PAIN MANAGED AT OR BELOW PT'S PAIN GOAL Description: At or below level 4 with prn meds Outcome: Progressing   Problem: RH KNOWLEDGE DEFICIT Goal: RH STG INCREASE KNOWLEDGE OF HYPERTENSION Description: Patient's caregiver will be able to manage HTN with medications and dietary modifications using handouts and educational materials inconveniently Outcome: Progressing Goal: RH STG INCREASE KNOWLEGDE OF HYPERLIPIDEMIA Description: Patient's caregiver will be able to manage HLD with medications and dietary modifications using handouts and educational materials inconveniently Outcome: Progressing   Problem: RH KNOWLEDGE DEFICIT BRAIN INJURY Goal: RH STG INCREASE KNOWLEDGE OF SELF CARE AFTER BRAIN INJURY Description: Patient's caregiver will be able to manage care with medications and dietary modifications using handouts and educational materials inconveniently Outcome: Progressing

## 2021-08-01 LAB — BASIC METABOLIC PANEL
Anion gap: 8 (ref 5–15)
BUN: 36 mg/dL — ABNORMAL HIGH (ref 8–23)
CO2: 26 mmol/L (ref 22–32)
Calcium: 9 mg/dL (ref 8.9–10.3)
Chloride: 107 mmol/L (ref 98–111)
Creatinine, Ser: 0.87 mg/dL (ref 0.44–1.00)
GFR, Estimated: >60 mL/min (ref >60)
Glucose, Bld: 96 mg/dL (ref 70–99)
Potassium: 4.3 mmol/L (ref 3.5–5.1)
Sodium: 141 mmol/L (ref 135–145)

## 2021-08-01 LAB — CBC
HCT: 35 % — ABNORMAL LOW (ref 36.0–46.0)
Hemoglobin: 10.9 g/dL — ABNORMAL LOW (ref 12.0–15.0)
MCH: 29.9 pg (ref 26.0–34.0)
MCHC: 31.1 g/dL (ref 30.0–36.0)
MCV: 95.9 fL (ref 80.0–100.0)
Platelets: 191 10*3/uL (ref 150–400)
RBC: 3.65 MIL/uL — ABNORMAL LOW (ref 3.87–5.11)
RDW: 13.5 % (ref 11.5–15.5)
WBC: 6.7 10*3/uL (ref 4.0–10.5)
nRBC: 0 % (ref 0.0–0.2)

## 2021-08-01 MED ORDER — SODIUM CHLORIDE 0.45 % IV SOLN: INTRAVENOUS | Status: DC

## 2021-08-01 NOTE — Progress Notes (Addendum)
Patient ID: Michelle Bauer, female   DOB: 07-25-1940, 81 y.o.   MRN: [id number]  SW left message for [name]/Admissions with [facility] [phone]) to discuss referral and if bed offer remains. SW waiting on follow-up.  *SW received updates reporting met with pt dtr and they intend to a view a private room tomorrow and will make a decision.   SW met with [name] to follow-up about SNF. Reports she viewed facility,a dn intends to go back tomorrow with [name] to view room. States if all goes well, pt will d/c to SNF. States she understands there are no LTC beds at this time. SW will f.u tomorrow on decision.   Loralee Pacas, MSW, [place] Office: [phone] Cell: [phone] Fax: [phone]

## 2021-08-01 NOTE — Plan of Care (Signed)
Goals downgraded to reflect SNF d/c   Problem: RH Balance Goal: LTG: Patient will maintain dynamic sitting balance (OT) Description: LTG:  Patient will maintain dynamic sitting balance with assistance during activities of daily living (OT) Flowsheets (Taken 08/01/2021 0812) LTG: Pt will maintain dynamic sitting balance during ADLs with: (downgraded d/t SNF d/c- SD) Moderate Assistance - Patient 50 - 74%   Problem: RH Dressing Goal: LTG Patient will perform lower body dressing w/assist (OT) Description: LTG: Patient will perform lower body dressing with assist, with/without cues in positioning using equipment (OT) Flowsheets (Taken 08/01/2021 0812) LTG: Pt will perform lower body dressing with assistance level of: (downgraded d/t SNF d/c- SD) Maximal Assistance - Patient 25 - 49%   Problem: RH Toilet Transfers Goal: LTG Patient will perform toilet transfers w/assist (OT) Description: LTG: Patient will perform toilet transfers with assist, with/without cues using equipment (OT) Flowsheets (Taken 08/01/2021 0812) LTG: Pt will perform toilet transfers with assistance level of: (downgraded d/t SNF d/c- SD) Maximal Assistance - Patient 25 - 49%   Problem: RH Tub/Shower Transfers Goal: LTG Patient will perform tub/shower transfers w/assist (OT) Description: LTG: Patient will perform tub/shower transfers with assist, with/without cues using equipment (OT) Outcome: Not Applicable

## 2021-08-01 NOTE — Progress Notes (Signed)
PROGRESS NOTE   Subjective/Complaints: No new issues. Seems to be in good spirits. No problems this weekend  ROS: limited due to language/communication    Objective:   No results found. Recent Labs    08/01/21 0606  WBC 6.7  HGB 10.9*  HCT 35.0*  PLT 191     Recent Labs    08/01/21 0606  NA 141  K 4.3  CL 107  CO2 26  GLUCOSE 96  BUN 36*  CREATININE 0.87  CALCIUM 9.0    No intake or output data in the 24 hours ending 08/01/21 1152        Physical Exam: Vital Signs Blood pressure (!) 153/67, pulse (!) 53, temperature (!) 97.5 F (36.4 C), temperature source Oral, resp. rate 18, height 5\' 3"  (1.6 m), weight 71.8 kg, SpO2 96 %.  Constitutional: No distress . Vital signs reviewed. HEENT: NCAT, EOMI, oral membranes moist Neck: supple Cardiovascular: RRR without murmur. No JVD    Respiratory/Chest: CTA Bilaterally without wheezes or rales. Normal effort    GI/Abdomen: BS +, non-tender, non-distended Ext: no clubbing, cyanosis, or edema Psych: pleasant and cooperative  Skin: No evidence of breakdown, no evidence of rash Neuro: allert with expressive language deficits. Best with automatic speech. LUE and LLE 1/5 with wrist/finger flexion, didn't see voluntary movement in leg.  Some early flexor tone developing in LUE and extensor tone in LLE. Moves RUE/RLE 4/5.  Sensation 1+/2 on left Musculoskeletal: sl subluxation left shoulder on exam. No pain with ROM of LUE and LLE today    Assessment/Plan: 1. Functional deficits which require 3+ hours per day of interdisciplinary therapy in a comprehensive inpatient rehab setting. Physiatrist is providing close team supervision and 24 hour management of active medical problems listed below. Physiatrist and rehab team continue to assess barriers to discharge/monitor patient progress toward functional and medical goals  Care Tool:  Bathing    Body parts bathed by  patient: Chest, Abdomen, Front perineal area, Buttocks, Right upper leg, Left upper leg, Face, Left arm, Left lower leg   Body parts bathed by helper: Right lower leg, Right arm     Bathing assist Assist Level: Maximal Assistance - Patient 24 - 49%     Upper Body Dressing/Undressing Upper body dressing   What is the patient wearing?: Pull over shirt    Upper body assist Assist Level: Moderate Assistance - Patient 50 - 74%    Lower Body Dressing/Undressing Lower body dressing      What is the patient wearing?: Incontinence brief     Lower body assist Assist for lower body dressing: 2 Helpers     Toileting Toileting    Toileting assist Assist for toileting: 2 Helpers     Transfers Chair/bed transfer  Transfers assist  Chair/bed transfer activity did not occur: Safety/medical concerns  Chair/bed transfer assist level: Maximal Assistance - Patient 25 - 49% Chair/bed transfer assistive device: Programmer, multimedia   Ambulation assist   Ambulation activity did not occur: Safety/medical concerns  Assist level: 2 helpers Assistive device: Other (comment) (R HHA) Max distance: 103ft   Walk 10 feet activity   Assist  Walk 10 feet  activity did not occur: Safety/medical concerns        Walk 50 feet activity   Assist Walk 50 feet with 2 turns activity did not occur: Safety/medical concerns         Walk 150 feet activity   Assist Walk 150 feet activity did not occur: Safety/medical concerns         Walk 10 feet on uneven surface  activity   Assist Walk 10 feet on uneven surfaces activity did not occur: Safety/medical concerns         Wheelchair     Assist Is the patient using a wheelchair?: No Type of Wheelchair: Manual Wheelchair activity did not occur: Safety/medical concerns         Wheelchair 50 feet with 2 turns activity    Assist    Wheelchair 50 feet with 2 turns activity did not occur: Safety/medical  concerns       Wheelchair 150 feet activity     Assist  Wheelchair 150 feet activity did not occur: Safety/medical concerns   Assist Level: 2 helpers   Blood pressure (!) 153/67, pulse (!) 53, temperature (!) 97.5 F (36.4 C), temperature source Oral, resp. rate 18, height 5\' 3"  (1.6 m), weight 71.8 kg, SpO2 96 %.  Medical Problem List and Plan: 1. 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  Antithrombotics: -DVT/anticoagulation:  Pharmaceutical: Lovenox             -antiplatelet therapy: N/a due to thalamic bleed.  3. Pain Management: Tylenol or tramadol prn.  4. Insomnia: continue melatonin 3mg  HS for now             -antipsychotic agents: N/A 5. Neuropsych: This patient is not capable of making decisions on her own behalf. 6. Skin/Wound Care: Routine pressure relief measures.  7. Fluids/Electrolytes/Nutrition:               --offering supplements between meals due to low calorie malnutrition (TP-5.6/alb-2.1), family can help too    -pushing po   -added megace to boost appetite   2/13 intake better    -but BUN up    -add IVF today in expectation of dc to snf soon 8. A fib: Monitor HR TID--rate control is good/bradycardic- continue amiodarone and metoprolol.  --Off Eliquis till follow up with neurology.  9. HTN: Monitor BP TID--SBP goal, 160. BP labile, continue metoprolol 25 mg bid with amiodarone 200 mg/day. Continue amlodipine 2.5mg .  -HR in 60's--continue same meds for now as bp controlled  10. Hypoxia: Continue 2 L per War and wean oxygen as able.              --encourage IS/Flutter valve, oob   -still needs 1-2 L O2 to maintain sats 11. E coli UTI: Completed 5 day course of antibiotic on 01/22.  12. GAD/depression: Manage with Lexapro and Klonopin PRN(took QID at home?) per Dr. Elder Love.             -sleep  seems reasonable  -pt is rarely using klonopin at this time    13. ABLA:     -no overt signs of bleeding  -stool guaic pending --request again -2/13 hgb stable at  10.9         LOS: 17 days A FACE TO FACE EVALUATION WAS PERFORMED  Meredith Staggers 08/01/2021, 11:52 AM

## 2021-08-01 NOTE — Progress Notes (Signed)
Physical Therapy Weekly Progress Note  Patient Details  Name: Michelle Bauer MRN: [id number] Date of Birth: September 16, 1940  Beginning of progress report period: July 24, 2021 End of progress report period: August 01, 2021  Today's Date: 08/01/2021 PT Individual Time: 1300-1310 PT Individual Time Calculation (min): 10 min   Patient has met 3 of 3 short term goals.  Patient with slow, but steady progress this week. Continues to be limited by controversive pushing to the L, limited motor return of L hemi-body with inattention, and pre-morbid scoliosis. She currently requires mod-max A with all mobility and max +2 for gait up to 50 ft. She has demonstrated 2/5 quad activation in her L lower extremity, but no other volitional activation at this time. Have initiated patient and caregiver education for management of expectations of recovery and updated POC for recommendation that patient d/c to SNF, family in agreement.   Patient continues to demonstrate the following deficits muscle weakness and muscle joint tightness, decreased cardiorespiratoy endurance and decreased oxygen support, abnormal tone, decreased coordination, and decreased motor planning, decreased midline orientation and decreased attention to left, decreased initiation, decreased attention, decreased problem solving, decreased memory, and delayed processing, and decreased sitting balance, decreased standing balance, decreased postural control, hemiplegia, and decreased balance strategies and therefore will continue to benefit from skilled PT intervention to increase functional independence with mobility.  Patient not progressing toward long term goals.  See goal revision..  Plan of care revisions: downgraded long term goals to mod A for bed mobility and transfers with dependent therapeutic gait training, d/c plan updated to SNF.  PT Short Term Goals Week 2:  PT Short Term Goal 1 (Week 2): Pt will improve trunk strength and sitting  balance in order to sit unsupported with MinA for 2 minutes. PT Short Term Goal 1 - Progress (Week 2): Met PT Short Term Goal 2 (Week 2): Pt will perform sit<>stand with pull-to-stand technique with consistent mod A consistently. PT Short Term Goal 2 - Progress (Week 2): Met PT Short Term Goal 3 (Week 2): Pt will perform bed<>chair transfers with max A using LRAD. PT Short Term Goal 3 - Progress (Week 2): Met Week 3:  PT Short Term Goal 1 (Week 3): STG=LTG due to ELOS.  Skilled Therapeutic Interventions/Progress Updates:     Patient in bed with her daughter at bedside upon PT arrival. Patient denied pain, however declined participating in therapy today. Her daughter reports that she is having a bad day. States that the patient refused lunch, but was able to eat some with her daughter providing total A for feeding. Patient appears despondent and did not want to engage in any conversation. She nodded yes when asked if she was having a tough time coping with her present condition. Provided education and therapeutic listening for patient's daughter for coping strategies and emotional challenges with patient's present condition. Patient and her daughter appreciative. Patient declined repositioning at this time. Patient in bed with her daughter at bedside with bed alarm set, 4 rails up per family request, and all needs in reach.  Therapy Documentation Precautions:  Precautions Precautions: Fall Precaution Comments: Dense L hemi with absent sensation, severe scoliosis, 2L 02, possible L shoulder sublux Restrictions Weight Bearing Restrictions: No General: PT Amount of Missed Time (min): 50 Minutes PT Missed Treatment Reason: Patient fatigue;Patient unwilling to participate   Therapy/Group: Individual Therapy  Edahi Kroening L Keelon Zurn PT, DPT  08/01/2021, 4:33 PM

## 2021-08-01 NOTE — Progress Notes (Signed)
Physical Therapy Session Note  Patient Details  Name: Michelle Bauer MRN: [id number] Date of Birth: 1941/05/20  Today's Date: 08/01/2021 PT Missed Time: 60 Minutes Missed Time Reason: Patient fatigue;Patient unwilling to participate  Skilled Therapeutic Interventions/Progress Updates:    Pt received supine in bed. Shakes head "no" to therapy and had reportedly refused OT previously this AM. PT will follow up as able.    Therapy Documentation Precautions:  Precautions Precautions: Fall Precaution Comments: Dense L hemi with absent sensation, severe scoliosis, 2L 02, possible L shoulder sublux Restrictions Weight Bearing Restrictions: No General: PT Amount of Missed Time (min): 60 Minutes PT Missed Treatment Reason: Patient fatigue;Patient unwilling to participate   Therapy/Group: Individual Therapy  Beau Fanny, PT, DPT 08/01/2021, 1:45 PM

## 2021-08-01 NOTE — Progress Notes (Signed)
Occupational Therapy Session Note  Patient Details  Name: Michelle Bauer MRN: [id number] Date of Birth: [birth year]  Today's Date: 08/01/2021 OT Individual Time: 5784-6962 OT Individual Time Calculation (min): 24 min  30 min missed d/t pt refusal    Short Term Goals: Week 2:  OT Short Term Goal 1 (Week 2): Pt will complete toilet transfer with +1 assist using LRAD OT Short Term Goal 2 (Week 2): Pt will recall hemi dressing techniques with no more than min cueing OT Short Term Goal 3 (Week 2): Pt will require no more than mod cueing for positioning of her LUE at rest OT Short Term Goal 4 (Week 2): Pt will sit EOB with no more than mod A  Skilled Therapeutic Interventions/Progress Updates:    Pt supine, closing eyes when OT entered room. She stated "go away" several times with eyes closed. Therapeutic use of self and emotional support provided to determine why pt was refusing session. Pt with decreased mood and self efficacy, stating she "doesn't want to do this anymore" and is tired of being a "burden". Provided therapeutic listening and offered pt favorite drink, diet dr pepper, which she accepted. She was agreeable to OT changing her brief at bed level, total A. Pt continued to decline any participation, missing 30 min of skilled OT. Will f/u with pt's family as well.   Therapy Documentation Precautions:  Precautions Precautions: Fall Precaution Comments: Dense L hemi with absent sensation, severe scoliosis, 2L 02, possible L shoulder sublux Restrictions Weight Bearing Restrictions: No General: General OT Amount of Missed Time: 30 Minutes   Therapy/Group: Individual Therapy  Crissie Reese 08/01/2021, 8:14 AM

## 2021-08-01 NOTE — Plan of Care (Signed)
Problem: RH Car Transfers Goal: LTG Patient will perform car transfers with assist (PT) Description: LTG: Patient will perform car transfers with assistance (PT). Outcome: Not Applicable Flowsheets (Taken 08/01/2021 2637) LTG: Pt will perform car transfers with assist:: (d/c goal due to d/c plan changed to SNF placement, patient unsafe to perform car transfers at this time with present deficits.) -- Note: d/c goal due to d/c plan changed to SNF placement, patient unsafe to perform car transfers at this time with present deficits.   Problem: RH Balance Goal: LTG Patient will maintain dynamic standing balance (PT) Description: LTG:  Patient will maintain dynamic standing balance with assistance during mobility activities (PT) Flowsheets (Taken 08/01/2021 0628) LTG: Pt will maintain dynamic standing balance during mobility activities with:: (downgraded goal due to limited progress and minimal motor return of L hemi-body motor control with functional mobility.) Moderate Assistance - Patient 50 - 74% Note: downgraded goal due to limited progress and minimal motor return of L hemi-body motor control with functional mobility.   Problem: Sit to Stand Goal: LTG:  Patient will perform sit to stand with assistance level (PT) Description: LTG:  Patient will perform sit to stand with assistance level (PT) Flowsheets (Taken 08/01/2021 8588) LTG: PT will perform sit to stand in preparation for functional mobility with assistance level: (downgraded goal due to limited progress and minimal motor return of L hemi-body motor control with functional mobility.) Moderate Assistance - Patient 50 - 74% Note: downgraded goal due to limited progress and minimal motor return of L hemi-body motor control with functional mobility.   Problem: RH Bed Mobility Goal: LTG Patient will perform bed mobility with assist (PT) Description: LTG: Patient will perform bed mobility with assistance, with/without cues (PT). Flowsheets  (Taken 08/01/2021 [phone]) LTG: Pt will perform bed mobility with assistance level of: (downgraded goal due to limited progress and minimal motor return of L hemi-body motor control with functional mobility.) Moderate Assistance - Patient 50 - 74% Note: downgraded goal due to limited progress and minimal motor return of L hemi-body motor control with functional mobility.   Problem: RH Bed to Chair Transfers Goal: LTG Patient will perform bed/chair transfers w/assist (PT) Description: LTG: Patient will perform bed to chair transfers with assistance (PT). Flowsheets (Taken 08/01/2021 [phone]) LTG: Pt will perform Bed to Chair Transfers with assistance level: (downgraded goal due to limited progress and minimal motor return of L hemi-body motor control with functional mobility.) Moderate Assistance - Patient 50 - 74% Note: downgraded goal due to limited progress and minimal motor return of L hemi-body motor control with functional mobility.   Problem: RH Ambulation Goal: LTG Patient will ambulate in controlled environment (PT) Description: LTG: Patient will ambulate in a controlled environment, # of feet with assistance (PT). Flowsheets (Taken 08/01/2021 [phone]) LTG: Pt will ambulate in controlled environ  assist needed:: (downgraded goal due to limited progress and minimal motor return of L hemi-body motor control with functional mobility.) Dependent - Patient equals 0% LTG: Ambulation distance in controlled environment: >50 feet consistently with focus on therapeutic gait for improved functional mobility and L hemi-body motor control Note: downgraded goal due to limited progress and minimal motor return of L hemi-body motor control with functional mobility.   Problem: RH Wheelchair Mobility Goal: LTG Patient will propel w/c in controlled environment (PT) Description: LTG: Patient will propel wheelchair in controlled environment, # of feet with assist (PT) Flowsheets (Taken 08/01/2021 7672) LTG: Pt will  propel w/c in controlled environ  assist needed:: (downgraded goal  due to limited progress and minimal motor return of L hemi-body motor control with functional mobility.) Supervision/Verbal cueing LTG: Propel w/c distance in controlled environment: at least 50 ft Note: downgraded goal due to limited progress and minimal motor return of L hemi-body motor control with functional mobility.

## 2021-08-02 DIAGNOSIS — R7989 Other specified abnormal findings of blood chemistry: Secondary | ICD-10-CM

## 2021-08-02 LAB — BASIC METABOLIC PANEL
Anion gap: 7 (ref 5–15)
BUN: 28 mg/dL — ABNORMAL HIGH (ref 8–23)
CO2: 25 mmol/L (ref 22–32)
Calcium: 9 mg/dL (ref 8.9–10.3)
Chloride: 104 mmol/L (ref 98–111)
Creatinine, Ser: 0.88 mg/dL (ref 0.44–1.00)
GFR, Estimated: >60 mL/min (ref >60)
Glucose, Bld: 97 mg/dL (ref 70–99)
Potassium: 4.1 mmol/L (ref 3.5–5.1)
Sodium: 136 mmol/L (ref 135–145)

## 2021-08-02 LAB — SARS CORONAVIRUS 2 (TAT 6-24 HRS): SARS Coronavirus 2: NEGATIVE

## 2021-08-02 MED ORDER — STUDY - ASPIRE - ASPIRIN 81 MG OR PLACEBO TABLET (PI-SETHI): 81.0000 mg | ORAL_TABLET | Freq: Every day | ORAL | Status: DC

## 2021-08-02 MED ORDER — ACETAMINOPHEN 325 MG PO TABS: 325.0000 mg | ORAL_TABLET | ORAL | Status: AC | PRN

## 2021-08-02 MED ORDER — ESCITALOPRAM OXALATE 10 MG PO TABS: ORAL_TABLET | ORAL | Status: DC

## 2021-08-02 MED ORDER — TRAZODONE HCL 50 MG PO TABS: 25.0000 mg | ORAL_TABLET | Freq: Every evening | ORAL | Status: AC | PRN

## 2021-08-02 MED ORDER — CLONAZEPAM 0.25 MG PO TBDP
0.2500 mg | ORAL_TABLET | Freq: Two times a day (BID) | ORAL | 0 refills | Status: AC | PRN
Start: 2021-08-02 — End: ?

## 2021-08-02 MED ORDER — BISACODYL 10 MG RE SUPP: 10.0000 mg | Freq: Every day | RECTAL | 0 refills | Status: DC

## 2021-08-02 MED ORDER — STUDY - ASPIRE - APIXABAN 5 MG OR PLACEBO TABLET (PI-SETHI): 5.0000 mg | ORAL_TABLET | Freq: Two times a day (BID) | ORAL | Status: DC

## 2021-08-02 MED ORDER — MELATONIN 3 MG PO TABS: 3.0000 mg | ORAL_TABLET | Freq: Every day | ORAL | 0 refills | Status: AC

## 2021-08-02 MED ORDER — ESCITALOPRAM OXALATE 10 MG PO TABS
10.0000 mg | ORAL_TABLET | Freq: Every morning | ORAL | Status: DC
  Administered 2021-08-03: 10 mg via ORAL
  Filled 2021-08-02: qty 1

## 2021-08-02 MED ORDER — FLEET ENEMA 7-19 GM/118ML RE ENEM: 1.0000 | ENEMA | Freq: Once | RECTAL | 0 refills | Status: DC | PRN

## 2021-08-02 MED ORDER — BACITRACIN ZINC 500 UNIT/GM EX OINT: TOPICAL_OINTMENT | CUTANEOUS | 0 refills | Status: DC | PRN

## 2021-08-02 NOTE — Discharge Summary (Addendum)
Physician Discharge Summary  Patient ID: Michelle Bauer MRN: [id number] DOB/AGE: January 04, 1941 81 y.o.  Admit date: 07/15/2021 Discharge date: 08/03/2021  Discharge Diagnoses:  Principal Problem:   ICH (intracerebral hemorrhage) ([place]) Active Problems:   Generalized anxiety disorder   Elevated fasting blood sugar   Essential hypertension   Paroxysmal A-fib (HCC)   Normocytic anemia   Prerenal azotemia   Abnormality of aortic arch--3 cm enlargement.    Discharged Condition: stable  Significant Diagnostic Studies:  DG Chest Port 1 View  Result Date: 07/18/2021 CLINICAL DATA:  Hypoxia. EXAM: PORTABLE CHEST 1 VIEW COMPARISON:  07/08/2021 portable chest and chest CTA FINDINGS: Stable severe dextroconvex thoracic scoliosis. Stable enlarged cardiac silhouette and post CABG changes. Stable prominent epicardial fat pad on the left. Clear lungs with normal vascularity. Cholecystectomy clips. IMPRESSION: No acute abnormality. Electronically Signed   By: Claudie Revering M.D.   On: 07/18/2021 09:57    Labs:  Basic Metabolic Panel: Recent Labs  Lab 07/28/21 0605 07/29/21 0516 08/01/21 0606 08/02/21 0511  NA 140 140 141 136  K 3.9 4.0 4.3 4.1  CL 105 105 107 104  CO2 27 27 26 25   GLUCOSE 105* 99 96 97  BUN 33* 30* 36* 28*  CREATININE 1.06* 0.83 0.87 0.88  CALCIUM 8.9 9.0 9.0 9.0    CBC: CBC Latest Ref Rng & Units 08/01/2021 07/28/2021 07/22/2021  WBC 4.0 - 10.5 K/uL 6.7 6.1 5.3  Hemoglobin 12.0 - 15.0 g/dL 10.9(L) 10.6(L) 11.0(L)  Hematocrit 36.0 - 46.0 % 35.0(L) 33.5(L) 35.4(L)  Platelets 150 - 400 K/uL 191 243 299      Brief HPI:   Michelle Bauer is a 81 y.o. female with history of HTN, PAF-on eliquis, anxiety/depression, scoliosis who was admitted via [facility] after fall with difficulty speaking and left sided weakness. 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  Dr. Leonie Man felt that bleed was hypertensive in setting  of [facility].  Eliquis reversed and she was started on Cleveprex as BP >220 at admission. She was also started on antibiotics for treatment of E coli UTI and has required oxygen due to issues with hypoxia. CTA chest was negative for PE and showed consolidated LLL opacities likely atelectasis without effusion and incidental finding of enlarged aortic arch at  3 cm with recommendations for yearly CTA or MRA for monitoring.   Clear duplex was weaned off with recommendations to keep systolic blood pressure less than 160.  Klonopin was resumed to help manage anxiety and sleeping disruption.  Confusion was resolving and she was weaned down to 2 L per nasal cannula due to ongoing issues with hypoxia.  She did have some issues with abdominal distention and concerns of gastric obstruction but no work-up indicated as patient without pain or nausea vomiting per general surgery input.  She continued to be limited by left hemiplegia with sensory deficits, left body inattention, worsening of baseline cognitive deficits and was working with therapy on balance at edge of bed.  CIR was recommended due to functional decline.   Hospital Course: Michelle Bauer was admitted to rehab 07/15/2021 for inpatient therapies to consist of PT, ST and OT at least three hours five days a week. Past admission physiatrist, therapy team and rehab RN have worked together to provide customized collaborative inpatient rehab. Blood pressures and HR were monitored on TID basis and have been controlled on amiodarone and metoprolol. She continues to have issues  with hypoxia and pulmonary hygiene encouraged. She requires 2 L oxygen per [place] due to ongoing issues with hypoxia. Anxiety/mood has been managed on home regimen but noted to have some increase in anxiety prior to discharge therefore Lexapro was increased to 10 mg at discharge.    Follow up check of lytes showed evidence of pre-renal azotemia and she was encouraged to increase fluid intake without  improvement. She  was started on nocturnal IVF for gentle hydration with improvement and recommend offering fluids frequently to maintain adequate hydration status. Follow up CBC showed H/H to be stable. She was started on ASPIRE study drug on 02/03 and she is to follow up with neurology for .  She has been making slow progress and continues to be limited by severe scoliosis, hypoxia, dense left hemiplegia with sensory deficits as well as cognitive deficits affecting functional status. Family has elected on SNF for progressive therapy as her ALF is unable to provide care needed. She was discharged to [facility] on 08/03/21    Rehab course: During patient's stay in rehab weekly team conferences were held to monitor patient's progress, set goals and discuss barriers to discharge. At admission, patient required max assist +2 with mobility and total assist with basic self care tasks.  She  has had improvement in activity tolerance, balance, postural control as well as ability to compensate for deficits. She has had improvement in functional use LUE  and LLE as well as improvement in awareness. She requires min to mod mod assist for upper body ADLs and mod to max max assist for lower body tasks. She requires mod to max assist for bed mobility. She requires mod to max assist for stand pivot transfers with left knee blocked and is able to stand for a minute with extended rest breaks. .   Disposition: Skilled nursing facility  Diet: Heart Healthy.  Off of fluids frequently to maintain adequate hydration  Special Instructions: Recommend monitoring of hydration status with serial check of electrolytes.  Recommend yearly CTA or MRA chest for monitoring of aortic arch enlargement. 3. Note patient on Aspire study medication which will be provided by neurology research team. A month supply sent with patient and contact Neurology stroke team [phone]) at [facility] for questions. .   Allergies as of 08/03/2021        Reactions   Penicillins Other (See Comments)   Has patient had a PCN reaction causing immediate rash, facial/tongue/throat swelling, SOB or lightheadedness with hypotension: Unknown Has patient had a PCN reaction causing severe rash involving mucus membranes or skin necrosis: Unknown Has patient had a PCN reaction that required hospitalization: Unknown Has patient had a PCN reaction occurring within the last 10 years: No If all of the above answers are "NO", then may proceed with Cephalosporin use.   Sulfa Antibiotics Other (See Comments)   Unknown reaction        Medication List     STOP taking these medications    cholecalciferol 25 MCG (1000 UNIT) tablet Commonly known as: VITAMIN D3   clonazePAM 0.5 MG tablet Commonly known as: KLONOPIN Replaced by: clonazePAM 0.25 MG disintegrating tablet You also have another medication with the same name that you need to continue taking as instructed.   FISH OIL PO   senna-docusate 8.6-50 MG tablet Commonly known as: Senokot-S   traMADol 50 MG tablet Commonly known as: ULTRAM       TAKE these medications    acetaminophen 325 MG tablet Commonly known as:  TYLENOL Take 1-2 tablets (325-650 mg total) by mouth every 4 (four) hours as needed for mild pain. What changed:  medication strength how much to take when to take this additional instructions   amiodarone 200 MG tablet Commonly known as: PACERONE Take 1 tablet (200 mg total) by mouth daily. What changed: when to take this   amLODipine 2.5 MG tablet Commonly known as: NORVASC Take 1 tablet (2.5 mg total) by mouth daily.   apixaban or placebo 5 mg Tabs tablet Take 1 tablet (5 mg total) by mouth 2 (two) times daily.   aspirin or placebo 81 mg Tabs tablet Take 1 tablet (81 mg total) by mouth daily.   bacitracin ointment Apply topically as needed for wound care.   bisacodyl 10 MG suppository Commonly known as: DULCOLAX Place 1 suppository (10 mg total) rectally  daily.   clonazePAM 0.5 MG tablet Commonly known as: KLONOPIN Take 1 tablet (0.5 mg total) by mouth 2 (two) times daily. What changed:  Another medication with the same name was added. Make sure you understand how and when to take each. Another medication with the same name was removed. Continue taking this medication, and follow the directions you see here.   clonazePAM 0.25 MG disintegrating tablet Commonly known as: KLONOPIN Take 1 tablet (0.25 mg total) by mouth 2 (two) times daily as needed (agitation, anxiety). What changed: You were already taking a medication with the same name, and this prescription was added. Make sure you understand how and when to take each. Replaces: clonazePAM 0.5 MG tablet   escitalopram 10 MG tablet Commonly known as: LEXAPRO TAKE 1 TABLET(5 MG) BY MOUTH DAILY What changed: medication strength   melatonin 3 MG Tabs tablet Take 1 tablet (3 mg total) by mouth at bedtime.   metoprolol tartrate 25 MG tablet Commonly known as: LOPRESSOR Take 1 tablet (25 mg total) by mouth 2 (two) times daily.   multivitamin with minerals Tabs tablet Take 1 tablet by mouth every morning. Women's 50 +   pantoprazole 40 MG tablet Commonly known as: PROTONIX Take 1 tablet (40 mg total) by mouth at bedtime.   polyethylene glycol 17 g packet Commonly known as: MIRALAX / GLYCOLAX Take 17 g by mouth daily.   sodium phosphate 7-19 GM/118ML Enem Place 133 mLs (1 enema total) rectally once as needed for severe constipation.   traZODone 50 MG tablet Commonly known as: DESYREL Take 0.5-1 tablets (25-50 mg total) by mouth at bedtime as needed for sleep.   vitamin B-12 500 MCG tablet Commonly known as: CYANOCOBALAMIN Take 1,000 mcg by mouth every morning.        Contact information for follow-up providers     Meredith Staggers, MD Follow up.   Specialty: Physical Medicine and Rehabilitation Why: office will call you with follow up appointment Contact  information: [address] [place] [address] [phone]         Kirk Ruths, MD Follow up.   Specialty: Internal Medicine Contact information: [place] [address] [phone]         Minna Merritts, MD .   Specialty: Cardiology Contact information: [place] [address] [phone]              Contact information for after-discharge care     Destination     [facility]erred [facility] .   Service: Skilled Nursing Contact information: [phone]  [place] [phone]                     Signed: Bary Leriche 08/03/2021, 9:37 AM

## 2021-08-02 NOTE — Progress Notes (Signed)
Physical Therapy Discharge Summary  Patient Details  Name: Michelle Bauer MRN: [id number] Date of Birth: [birth year]  Today's Date: 08/02/2021   Patient has met 6 of 7 long term goals due to improved activity tolerance, improved balance, improved postural control, increased strength, decreased pain, ability to compensate for deficits, improved attention, improved awareness, and improved coordination.  Patient to discharge at a wheelchair level [place].   Patient's care partner requires assistance to provide the necessary physical assistance at discharge, plan to d/c to SNF for continued progress with skilled therapies for reduced caregiver burden.   Reasons goals not met: Patient unable to tolerate sitting in a manual w/c at this time and unable to progress to w/c mobility at this time.  Recommendation:  Patient will benefit from ongoing skilled PT services in skilled nursing facility setting to continue to advance safe functional mobility, address ongoing impairments in balance, L attention, postural control, midline orientation, functional mobility, activity tolerance, patient/caregiver education, and minimize fall risk.  Equipment: No equipment provided, equipment to be provided at next level of care  Reasons for discharge: treatment goals met and discharge from hospital  Patient/family agrees with progress made and goals achieved: Yes  PT Discharge Precautions/Restrictions Precautions Precautions: Fall Precaution Comments: Dense L hemi with deminished sensation, severe scoliosis, 2L/min 02 Restrictions Weight Bearing Restrictions: No Pain Interference Pain Interference Pain Effect on Sleep: 2. Occasionally Pain Interference with Therapy Activities: 1. Rarely or not at all Pain Interference with Day-to-Day Activities: 1. Rarely or not at all Vision/Perception  Vision - History Ability to See in Adequate Light: 0 Adequate Perception Perception: Impaired Inattention/Neglect:  Does not attend to left visual field;Does not attend to left side of body Praxis Praxis: Intact  Cognition Overall Cognitive Status: History of cognitive impairments - at baseline Arousal/Alertness: Awake/alert Orientation Level: Oriented X4 Year: 2023 Month: January Day of Week: Incorrect Attention: Selective Focused Attention: Appears intact Sustained Attention: Appears intact Selective Attention: Impaired Selective Attention Impairment: Verbal basic;Functional basic Memory: Impaired Memory Impairment: Decreased recall of new information;Decreased short term memory;Retrieval deficit Decreased Short Term Memory: Verbal basic;Functional basic Immediate Memory Recall: Sock;Blue;Bed Memory Recall Sock: With Cue Memory Recall Blue: With Cue Memory Recall Bed: Not able to recall Awareness: Appears intact Problem Solving: Appears intact Executive Function:  (all impaired) Safety/Judgment: Appears intact Comments: Delayed responses and minimal verbilizations Sensation Sensation Light Touch: Impaired Detail Central sensation comments: L hemi-body with deminished sensation to light touch and intact with noxious stimulus Light Touch Impaired Details: Impaired LUE;Impaired LLE Hot/Cold: Appears Intact Proprioception: Impaired Detail Proprioception Impaired Details: Impaired LUE;Impaired LLE Coordination Gross Motor Movements are Fluid and Coordinated: No Fine Motor Movements are Fluid and Coordinated: No Coordination and Movement Description: Affected by dense L hemiplegia with L inattention Motor  Motor Motor: Hemiplegia;Abnormal postural alignment and control;Abnormal tone Motor - Discharge Observations: Remains with L hemi and generalized weakness  Mobility Bed Mobility Bed Mobility: Sit to Supine;Supine to Sit Rolling Right: Moderate Assistance - Patient 50-74% Rolling Left: Moderate Assistance - Patient 50-74% Supine to Sit: Maximal Assistance - Patient - Patient  25-49% Sit to Supine: Moderate Assistance - Patient 50-74% (with use of hospital bed features) Transfers Transfers: Squat Pivot Transfers Sit to Stand: Moderate Assistance - Patient 50-74% (using R rail) Stand to Sit: Moderate Assistance - Patient 50-74% (with R rail) Squat Pivot Transfers: Moderate Assistance - Patient 50-74% Transfer via Lift Equipment: Stedy (with min A of 1 person) Locomotion  Gait Ambulation: Yes Gait Assistance: 2 Helpers  Gait Distance (Feet): 50 Feet Assistive device: 2 person hand held assist Gait Assistance Details: Manual facilitation for placement;Manual facilitation for weight shifting;Manual facilitation for weight bearing;Verbal cues for gait pattern;Tactile cues for posture;Tactile cues for initiation Gait Assistance Details: max A for L lower extremity advancement and placement, block L knee in stance to prevent recurvatum Gait Gait: Yes Gait Pattern: Impaired Gait Pattern: Step-through pattern;Decreased stance time - left;Decreased stride length;Decreased hip/knee flexion - left;Decreased dorsiflexion - left;Decreased weight shift to left;Left genu recurvatum;Lateral hip instability;Lateral trunk lean to left;Decreased trunk rotation;Poor foot clearance - left Gait velocity: significantly decreased Stairs / Additional Locomotion Stairs: No Wheelchair Mobility Wheelchair Mobility: No  Trunk/Postural Assessment  Cervical Assessment Cervical Assessment: Exceptions to [facility] (forward head, Lt lateral rotation) Thoracic Assessment Thoracic Assessment: Exceptions to [facility] (R thoracic C-curve scoliosis) Lumbar Assessment Lumbar Assessment: Exceptions to [facility] (posterior pelvic tilt with depressed R sided innominate) Postural Control Postural Control: Deficits on evaluation (mild controversive pushig to the L with L trunk lean, exacerbated by scoliosis)  Balance Balance Balance Assessed: Yes Standardized Balance Assessment Standardized Balance Assessment:  PASS Postural Assessment Scale for Stroke Patients=PASS 1. Sitting Without Support: Can sit for more than 10 seconds without support 2. Standing With Support: Can stand with moderate support of 1 person 3. Standing Without Support: Cannot stand without support 4.Standing on Nonparetic Leg: Cannot stand on nonparetic leg 5.Standing on Paretic Leg: Cannot stand on paretic leg MAINTAINING POSTURE SUBTOTAL: 4 6. Supine to Paretic Side Lateral: Can perform with much help 7. Supine to Nonparetic Side Lateral: Cannot perform 8. Supine to Sitting Up on the Edge of the Mat: Can perform with much help 9. Sitting on the Edge of the Mat to Supine: Can perform with much help 10. Sitting to Standing Up: Can perform with much help 11. Standing Up to Sitting Down: Can perform with much help 12. Standing,Picking Up a Pencil from the Floor: Cannot perform CHANGING POSTURE SUBTOTAL: 5 PASS TOTAL SCORE: 9 Static Sitting Balance Static Sitting - Balance Support: Feet supported;Right upper extremity supported Static Sitting - Level of Assistance: 5: Stand by assistance Static Standing Balance Static Standing - Balance Support: Right upper extremity supported;During functional activity Static Standing - Level of Assistance: 3: Mod assist (with R hand on rail) Extremity Assessment  RLE Assessment RLE Assessment: Within Functional Limits General Strength Comments: Grossly 4+ to 5/5 throughout LLE Assessment LLE Assessment: Exceptions to [facility] LLE Strength LLE Overall Strength: Deficits Left Hip Flexion: 2-/5 Left Knee Flexion: 1/5 Left Knee Extension: 2+/5 Left Ankle Dorsiflexion: 1/5 Left Ankle Plantar Flexion: 1/5    Bionca Mckey L Lei Dower PT, DPT  08/02/2021, 12:51 PM

## 2021-08-02 NOTE — Progress Notes (Signed)
Physical Therapy Session Note  Patient Details  Name: Michelle Bauer MRN: [id number] Date of Birth: 1941/02/11  Today's Date: 08/02/2021 PT Individual Time: 1346-1445 PT Individual Time Calculation (min): 59 min   Short Term Goals: Week 3:  PT Short Term Goal 1 (Week 3): STG=LTG due to ELOS.  Skilled Therapeutic Interventions/Progress Updates:     Pt received supine in bed and agrees to therapy. No complaint of pain. Pt performs rolling to the L with minA and rolling to the R with maxA to doff brief. PT performs pericare as pt had been incontinent of bowel and bladder. Bilateral rolling with cues for body mechanics and hand placement to assist with donning clean brief and pants. MaxA for supine to sit with cues for sequencing. MaxA for sit to stand and stand pivot transfer to [facility] with heavy facilitation of anterior weight shift and cues for posture. WC transport to gym for time management. Pt utilizes mirror for visual feedback to attain neutral posture. Pt then performs x2 reps of sit to stand with modA/maxA and PT blocking L knee, with cues for upright gaze to improve posture and balance, increased hip and trunk extension, and hand placement for safety. Pt remains standing ~1 minute each rep and requires extended seated rest break in between bouts.   Pt performs stand pivot transfer back to bed with maxA and cues for hand placement and sequencing. Sit to supine with totalA. Left with alarm intact and all needs within reach.  Therapy Documentation Precautions:  Precautions Precautions: Fall Precaution Comments: Dense L hemi with deminished sensation, severe scoliosis, 2L/min 02 Restrictions Weight Bearing Restrictions: No  Therapy/Group: Individual Therapy  Beau Fanny, PT, DPT 08/02/2021, 4:06 PM

## 2021-08-02 NOTE — Progress Notes (Signed)
Patient ID: Michelle Bauer, female   DOB: 1940/12/09, 81 y.o.   MRN: [id number]  SW received message from pt dtr Michelle Bauer reporting that she and her sister viewed Michelle Bauer and will move forward with placement. Reports her sister Michelle Bauer will be at hospital today.   SW scheduled [facility] ambulance pick up for tomorrow at 10:30am.  SW spoke with Michelle/Admission with [facility] to confirm above. Willing to accept pt for tomorrow. SW informed on transportation. SW will provide COVID test as requested.   SW met with pt and pt dtr Michelle Bauer in room to discuss transition tomorrow and above about transportation. No questions/concerns reported.   Medical team notified.  Loralee Pacas, MSW, [place] Office: [phone] Cell: [phone] Fax: [phone]

## 2021-08-02 NOTE — Progress Notes (Signed)
Occupational Therapy Discharge Summary  Patient Details  Name: Michelle Bauer MRN: [id number] Date of Birth: 12/04/1940   Patient has met 5 of 7 long term goals due to improved activity tolerance, improved balance, postural control, ability to compensate for deficits, functional use of  LEFT upper and LEFT lower extremity, improved attention, improved awareness, and improved coordination.  Patient to discharge at overall Mod Assist level.  Patient's care partner is independent to provide the necessary physical and cognitive assistance at discharge. Family education has been completed and determined that pt would benefit from SNF placement for continued rehab. Her LUE has gravity eliminated elbow flexion (40 degrees) and about 10 degrees of elbow extension. She has 25% composite grasp flexion/extension. Pt's is significantly limited by baseline scoliosis that makes midline orientation difficult/uncomfortable.   Reasons goals not met: Pt with slower than expected progress and will require SNF at discharge for care needs. She still requires mod A for UB ADLs.   Recommendation:  Patient will benefit from ongoing skilled OT services in skilled nursing facility setting to continue to advance functional skills in the area of BADL and Reduce care partner burden.  Equipment: No equipment provided  Reasons for discharge: treatment goals met and discharge from hospital  Patient/family agrees with progress made and goals achieved: Yes  OT Discharge Precautions/Restrictions  Precautions Precautions: Fall Precaution Comments: Dense L hemi with deminished sensation, severe scoliosis, 2L/min 02 Restrictions Weight Bearing Restrictions: No Pain Pain Assessment Pain Scale: 0-10 Pain Score: 0-No pain ADL ADL Eating: Set up Where Assessed-Eating: Bed level Grooming: Supervision/safety Where Assessed-Grooming: Wheelchair Upper Body Bathing: Minimal assistance Where Assessed-Upper Body Bathing:  Sitting at sink Lower Body Bathing: Moderate assistance Where Assessed-Lower Body Bathing: Sitting at sink Upper Body Dressing: Moderate assistance Where Assessed-Upper Body Dressing: Sitting at sink Lower Body Dressing: Maximal assistance Where Assessed-Lower Body Dressing: Sitting at sink, Wheelchair Toileting: Moderate assistance Where Assessed-Toileting: Bed level Toilet Transfer: Maximal assistance Toilet Transfer Method: Stand pivot Toilet Transfer Equipment: Grab bars, Bedside commode Tub/Shower Transfer: Unable to assess ADL Comments: transfers not assessed due to OT not having +2 assist during eval Vision Baseline Vision/History: 1 Wears glasses Patient Visual Report: No change from baseline Vision Assessment?: No apparent visual deficits Perception  Perception: Impaired Inattention/Neglect: Does not attend to left visual field;Does not attend to left side of body Praxis Praxis: Intact Cognition Overall Cognitive Status: History of cognitive impairments - at baseline Arousal/Alertness: Awake/alert Orientation Level: Oriented X4 Year: 2023 Month: January Day of Week: Incorrect Attention: Selective Focused Attention: Appears intact Sustained Attention: Appears intact Selective Attention: Impaired Selective Attention Impairment: Verbal basic;Functional basic Memory: Impaired Memory Impairment: Decreased recall of new information;Decreased short term memory;Retrieval deficit Decreased Short Term Memory: Verbal basic;Functional basic Immediate Memory Recall: Sock;Blue;Bed Memory Recall Sock: With Cue Memory Recall Blue: With Cue Memory Recall Bed: Not able to recall Awareness: Appears intact Problem Solving: Appears intact Executive Function:  (all impaired) Safety/Judgment: Appears intact Comments: Delayed responses and minimal verbilizations Sensation Sensation Light Touch: Impaired Detail Central sensation comments: L hemi-body with deminished sensation to  light touch and intact with noxious stimulus Light Touch Impaired Details: Impaired LUE;Impaired LLE Hot/Cold: Appears Intact Proprioception: Impaired Detail Proprioception Impaired Details: Impaired LUE;Impaired LLE Coordination Gross Motor Movements are Fluid and Coordinated: No Fine Motor Movements are Fluid and Coordinated: No Coordination and Movement Description: Affected by dense L hemiplegia with L inattention Motor  Motor Motor: Hemiplegia;Abnormal postural alignment and control;Abnormal tone Motor - Discharge Observations: Remains with L  hemi and generalized weakness Mobility  Bed Mobility Bed Mobility: Sit to Supine;Supine to Sit Rolling Right: Moderate Assistance - Patient 50-74% Rolling Left: Moderate Assistance - Patient 50-74% Supine to Sit: Moderate Assistance - Patient 50-74% Sit to Supine: Moderate Assistance - Patient 50-74% Transfers Sit to Stand: Moderate Assistance - Patient 50-74% (using R rail) Stand to Sit: Moderate Assistance - Patient 50-74% (R rail)  Trunk/Postural Assessment  Cervical Assessment Cervical Assessment: Exceptions to WFL (forward, L lean) Thoracic Assessment Thoracic Assessment: Exceptions to [facility] (R thoracic C curve scoliosis) Lumbar Assessment Lumbar Assessment: Exceptions to [facility] (Posterior pelvic tilt, pelvic obliquity) Postural Control Postural Control: Deficits on evaluation (mild L pushing still present)  Balance Balance Balance Assessed: Yes Standardized Balance Assessment Standardized Balance Assessment: PASS Postural Assessment Scale for Stroke Patients=PASS 1. Sitting Without Support: Can sit for more than 10 seconds without support 2. Standing With Support: Can stand with moderate support of 1 person 3. Standing Without Support: Cannot stand without support 4.Standing on Nonparetic Leg: Cannot stand on nonparetic leg 5.Standing on Paretic Leg: Cannot stand on paretic leg MAINTAINING POSTURE SUBTOTAL: 4 6. Supine to  Paretic Side Lateral: Can perform with much help 7. Supine to Nonparetic Side Lateral: Cannot perform 8. Supine to Sitting Up on the Edge of the Mat: Can perform with much help 9. Sitting on the Edge of the Mat to Supine: Can perform with much help 10. Sitting to Standing Up: Can perform with much help 11. Standing Up to Sitting Down: Can perform with much help 12. Standing,Picking Up a Pencil from the Floor: Cannot perform CHANGING POSTURE SUBTOTAL: 5 PASS TOTAL SCORE: 9 Static Sitting Balance Static Sitting - Balance Support: Feet supported;Right upper extremity supported Static Sitting - Level of Assistance: 5: Stand by assistance Static Standing Balance Static Standing - Balance Support: Right upper extremity supported;During functional activity Static Standing - Level of Assistance: 3: Mod assist (R hand on rail) Extremity/Trunk Assessment RUE Assessment RUE Assessment: Within Functional Limits LUE Assessment LUE Assessment: Exceptions to [facility] LUE Body System: Neuro Brunstrum levels for arm and hand: Arm;Hand Brunstrum level for arm: Stage II Synergy is developing Brunstrum level for hand: Stage II Synergy is developing   Curtis Sites 08/02/2021, 12:54 PM

## 2021-08-02 NOTE — Progress Notes (Signed)
Covid test obtained and taken to lab

## 2021-08-02 NOTE — Patient Care Conference (Signed)
Inpatient RehabilitationTeam Conference and Plan of Care Update Date: 08/02/2021   Time: 10:22 AM    Patient Name: Michelle Bauer      Medical Record Number: [id number]  Date of Birth: 07-08-1940 Sex: Female         Room/Bed: 5C20C/5C20C-01 Payor Info: Payor: MEDICARE / Plan: MEDICARE PART A AND B / Product Type: *No Product type* /    Admit Date/Time:  07/15/2021  3:55 PM  Primary Diagnosis:  ICH (intracerebral hemorrhage) [facility])  Hospital Problems: Principal Problem:   ICH (intracerebral hemorrhage) [facility])    Expected Discharge Date: Expected Discharge Date: 08/03/21 (SNF)  Team Members Present: Physician leading conference: Dr. Alger Simons Social Worker Present: Loralee Pacas, [place] Nurse Present: Dorthula Nettles, RN PT Present: Apolinar Junes, PT OT Present: Laverle Hobby, OT PPS Coordinator present : Gunnar Fusi, SLP     Current Status/Progress Goal Weekly Team Focus  Bowel/Bladder   Patient is Incontinent of Bladder and Bowel. LBM 08/01/21  Contol of urinary and bowel incontinence  Assess toileting Q 2 hours and PRN   Swallow/Nutrition/ Hydration             ADL's   Still very involved, sitting balance min-mod A, mod-max A stand pivot transfers, Some L pushing. No real change in the LUE, 25% composite grasp, activation in bicep/tricep with 60 degrees of AROM in gravity eliminated position  Downgraded to reflect SNF d/c- mod -max A overall  Family education, d/c planning, LUE NMR, sitting balance   Mobility   mod-max A overall, gait 50 ft max +2, controversive pushing still present, volitional quad activation on L, affect continues to decline about loss of independence  Downgraded to mod A overall due to slow progress.  balance, postural control, midline orientation, bed mobility and transfer training, therapeutic gait, activity tolerance, patient/caregiver education   Communication             Safety/Cognition/ Behavioral Observations            Pain    Generalized pain  < 3/10  Assess pain QS/and Q4hrs prn   Skin   Nasal redness resolving, slight redness , no new areas noted  no new integrity breakdown  Skin assessment QS/PRN     Discharge Planning:  Pt will d/c to SNF. Family visiting [facility]. SNF palcement pending bed offer as family is tryin to find a facility that also offers LTC.   Team Discussion: Patient has no new issues. Medically ready for discharge. Remains incontinent, facial wound healed. Reports being depressed. Discharging to SNF.  Patient on target to meet rehab goals: yes, mod assist goals.  *See Care Plan and progress notes for long and short-term goals.   Revisions to Treatment Plan:  Finalizing discharge mediations and discharge plans.   Teaching Needs: Family education completed. Discharging to SNF.   Current Barriers to Discharge: No barriers noted.  Possible Resolutions to Barriers: Discharging to [facility] 08/03/2021.     Medical Summary Current Status: more alert, slow functional progress overall. shes eating better but hydration has  been an issue  Barriers to Discharge: Medical stability   Possible Resolutions to [employer] Focus: daily assessment of labs and pt datta. finally dc planning   Continued Need for Acute Rehabilitation Level of Care: The patient requires daily medical management by a physician with specialized training in physical medicine and rehabilitation for the following reasons: Direction of a multidisciplinary physical rehabilitation program to maximize functional independence : Yes Medical management of patient stability  for increased activity during participation in an intensive rehabilitation regime.: Yes Analysis of laboratory values and/or radiology reports with any subsequent need for medication adjustment and/or medical intervention. : Yes   I attest that I was present, lead the team conference, and concur with the assessment and plan of the team.   Cristi Loron 08/02/2021, 2:37 PM

## 2021-08-02 NOTE — Progress Notes (Signed)
Occupational Therapy Session Note  Patient Details  Name: Michelle Bauer MRN: [id number] Date of Birth: 23-May-1941  Today's Date: 08/02/2021 OT Individual Time: 7829-5621 OT Individual Time Calculation (min): 70 min    Short Term Goals: Week 2:  OT Short Term Goal 1 (Week 2): Pt will complete toilet transfer with +1 assist using LRAD OT Short Term Goal 2 (Week 2): Pt will recall hemi dressing techniques with no more than min cueing OT Short Term Goal 3 (Week 2): Pt will require no more than mod cueing for positioning of her LUE at rest OT Short Term Goal 4 (Week 2): Pt will sit EOB with no more than mod A  Skilled Therapeutic Interventions/Progress Updates:   Pt received supine with improved mood today, but still reporting she is sad about SNF d/c and "too heavy for you to move". Provided clinical insight into progress so far and improvements pt has made. Also spent extra time encouraging and educating pt on need to advocate for herself and her needs. Provided emotional support and encouragement that she has two loving daughters who will not leave her side as well. Pt completed bed mobility with mod A for mod A brief change. She came to EOB with mod A and was able to maintain sitting balance with min-mod A. Max A stand pivot transfer to the TIS w/c. Max A to don pants sit <> stand. She was agreeable to hair wash tilted back in the w/c- which was done with max A. Pt able to hold hair washing tray. She completed UB bathing and dressing with mod A overall- improved awareness of need for assist with L hemi side. She brushed her teeth with set up assist. Pt reported she was incontinent and was provided max A for brief change in standing. Pt was left sitting up in the TIS w/c with all needs met, chair alarm set. RN present.   Therapy Documentation Precautions:  Precautions Precautions: Fall Precaution Comments: Dense L hemi with absent sensation, severe scoliosis, 2L 02, possible L shoulder  sublux Restrictions Weight Bearing Restrictions: No  Therapy/Group: Individual Therapy  Curtis Sites 08/02/2021, 9:02 AM

## 2021-08-02 NOTE — Progress Notes (Signed)
Physical Therapy Session Note  Patient Details  Name: Michelle Bauer MRN: [id number] Date of Birth: 1941/01/16  Today's Date: 08/02/2021 PT Individual Time: 1030-1130 PT Individual Time Calculation (min): 60 min   Short Term Goals: Week 3:  PT Short Term Goal 1 (Week 3): STG=LTG due to ELOS.  Skilled Therapeutic Interventions/Progress Updates:     Patient in TIS w/c in the room upon PT arrival. Patient alert and agreeable to PT session. Patient reported 3/10 back pain during session, RN made aware. PT provided repositioning, rest breaks, and distraction as pain interventions throughout session. Patient's daughter arrived during session.   Focused session on d/c assessment, see d/c note for details, and neuromotor reeducation for balance and lower extremity motor control with functional mobility.   Therapeutic Activity: Bed Mobility: Patient performed sit to supine with mod-max A for trunk and lower extremity support. Provided verbal cues for using her R arm to lower her trunk and bringing her knees to her chest to lift her legs onto the bed. She performed rolling R/L with min A to the L and mod A to the R with use of bed rail and scooting up in bed with max A +2 with cues for using her R leg to push herself up. Transfers: Patient performed sit to/from stand x1 with mod A pulling up with R hand on Steady bar without use of shin block and x1 with mod A +2 with R HHA. Provided multimodal cues for hand placement, R weight shift, L quad activation, and forward weight shift. Patient was incontinent of bowl and bladder at beginning of session, performed peri-care and lower body clothing management with total A during NMR in [place], see details below.   Neuromuscular Re-ed: Patient performed the following balance and lower extremity motor control activities for improved muscle activation and coordination/balance with functional mobility: -standing balance in front of a mirror for visual feedback in  the [place] x3 30-60 sec, focused on L quad and gluteal activation along with midline orientation with L trunk elongation with min A for balance and facilitation of muscle activation -sit to stand from [employer] seat x3 with min A-CGA focused on maintaining midline with equal weight bearing through B lower extremity  -therapeutic gait training x30 feet with max A +2 and max A for L leg advancement and placement focused on reciprocal gait pattern, quad and hamstring activation in L stance for improved knee control and activation for weight shifting and initiation of L swing phase  Patient in bed with R upper extremity elevated and her daughter at bedside at end of session with breaks locked, all needs in reach, and bed alarm off to allow patient's daughter to sit on the bed with her to read, instructed to inform nursing when she leaves to have alarm set, RN in agreement.   Therapy Documentation Precautions:  Precautions Precautions: Fall Precaution Comments: Dense L hemi with deminished sensation, severe scoliosis, 2L/min 02 Restrictions Weight Bearing Restrictions: No    Therapy/Group: Individual Therapy  Michelle Bauer L [name] PT, DPT  08/02/2021, 7:17 PM

## 2021-08-02 NOTE — Progress Notes (Addendum)
PROGRESS NOTE   Subjective/Complaints: Pt up in bed. No new issues. Therapy reported increased depression?  ROS: limited due to language/communication    Objective:   No results found. Recent Labs    08/01/21 0606  WBC 6.7  HGB 10.9*  HCT 35.0*  PLT 191     Recent Labs    08/01/21 0606 08/02/21 0511  NA 141 136  K 4.3 4.1  CL 107 104  CO2 26 25  GLUCOSE 96 97  BUN 36* 28*  CREATININE 0.87 0.88  CALCIUM 9.0 9.0     Intake/Output Summary (Last 24 hours) at 08/02/2021 1146 Last data filed at 08/02/2021 0900 Gross per 24 hour  Intake 1736.89 ml  Output 0 ml  Net 1736.89 ml          Physical Exam: Vital Signs Blood pressure (!) 117/51, pulse 69, temperature 98.1 F (36.7 C), temperature source Oral, resp. rate 18, height 5\' 3"  (1.6 m), weight 71.8 kg, SpO2 92 %.  Constitutional: No distress . Vital signs reviewed. HEENT: NCAT, EOMI, oral membranes moist Neck: supple Cardiovascular: RRR without murmur. No JVD    Respiratory/Chest: CTA Bilaterally without wheezes or rales. Normal effort    GI/Abdomen: BS +, non-tender, non-distended Ext: no clubbing, cyanosis, or edema Psych: pleasant and cooperative, seems to be in relatively good spirits  Skin: No evidence of breakdown, no evidence of rash Neuro: allert with expressive language deficits. Best with automatic speech. LUE and LLE 1/5 with wrist/finger flexion, didn't see voluntary movement in leg.  Some early flexor tone developing in LUE and extensor tone in LLE. Moves RUE/RLE 4/5.  Sensation 1+/2 on left Musculoskeletal: sl subluxation left shoulder on exam. No pain with ROM of LUE and LLE today    Assessment/Plan: 1. Functional deficits which require 3+ hours per day of interdisciplinary therapy in a comprehensive inpatient rehab setting. Physiatrist is providing close team supervision and 24 hour management of active medical problems listed  below. Physiatrist and rehab team continue to assess barriers to discharge/monitor patient progress toward functional and medical goals  Care Tool:  Bathing    Body parts bathed by patient: Left arm, Chest, Abdomen, Front perineal area, Right upper leg, Left upper leg, Right lower leg, Left lower leg, Face   Body parts bathed by helper: Right arm, Buttocks     Bathing assist Assist Level: Moderate Assistance - Patient 50 - 74%     Upper Body Dressing/Undressing Upper body dressing   What is the patient wearing?: Pull over shirt    Upper body assist Assist Level: Moderate Assistance - Patient 50 - 74%    Lower Body Dressing/Undressing Lower body dressing      What is the patient wearing?: Underwear/pull up, Pants     Lower body assist Assist for lower body dressing: Maximal Assistance - Patient 25 - 49%     Toileting Toileting    Toileting assist Assist for toileting: Moderate Assistance - Patient 50 - 74% (bed level)     Transfers Chair/bed transfer  Transfers assist  Chair/bed transfer activity did not occur: Safety/medical concerns  Chair/bed transfer assist level: Maximal Assistance - Patient 25 - 49% Chair/bed transfer  assistive device: Museum/gallery exhibitions officer assist   Ambulation activity did not occur: Safety/medical concerns  Assist level: 2 helpers Assistive device: Other (comment) (R HHA) Max distance: 14ft   Walk 10 feet activity   Assist  Walk 10 feet activity did not occur: Safety/medical concerns        Walk 50 feet activity   Assist Walk 50 feet with 2 turns activity did not occur: Safety/medical concerns         Walk 150 feet activity   Assist Walk 150 feet activity did not occur: Safety/medical concerns         Walk 10 feet on uneven surface  activity   Assist Walk 10 feet on uneven surfaces activity did not occur: Safety/medical concerns         Wheelchair     Assist Is the patient  using a wheelchair?: No Type of Wheelchair: Manual Wheelchair activity did not occur: Safety/medical concerns         Wheelchair 50 feet with 2 turns activity    Assist    Wheelchair 50 feet with 2 turns activity did not occur: Safety/medical concerns       Wheelchair 150 feet activity     Assist  Wheelchair 150 feet activity did not occur: Safety/medical concerns   Assist Level: 2 helpers   Blood pressure (!) 117/51, pulse 69, temperature 98.1 F (36.7 C), temperature source Oral, resp. rate 18, height 5\' 3"  (1.6 m), weight 71.8 kg, SpO2 92 %.  Medical Problem List and Plan: 1. 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            -Continue CIR therapies including PT, OT, and SLP. Interdisciplinary team conference today to discuss goals, barriers to discharge, and dc planning.     2.  Antithrombotics: -DVT/anticoagulation:  Pharmaceutical: Lovenox             -antiplatelet therapy: N/a due to thalamic bleed.  3. Pain Management: Tylenol or tramadol prn.  4. Insomnia/mood: continue melatonin 3mg  HS for now  2/14 increase lexapro to 10mg  qhs             -antipsychotic agents: N/A 5. Neuropsych: This patient is not capable of making decisions on her own behalf. 6. Skin/Wound Care: Routine pressure relief measures.  7. Fluids/Electrolytes/Nutrition:               --offering supplements between meals due to low calorie malnutrition (TP-5.6/alb-2.1), family can help too    -pushing po   -added megace to boost appetite   2/14 intake improved    -BUN decreased today (28)    -continue IVF until dc to SNF 8. A fib: Monitor HR TID--rate control is good/bradycardic- continue amiodarone and metoprolol.  --Off Eliquis till follow up with neurology.  9. HTN: Monitor BP TID--SBP goal, 160. BP labile, continue metoprolol 25 mg bid with amiodarone 200 mg/day. Continue amlodipine  2.5mg .  -HR in 60's--continue same meds for now as bp controlled  10. Hypoxia: Continue 2 L per [place] and wean oxygen as able.              --encourage IS/Flutter valve, oob   -still needs 1-2 L O2 to maintain sats 11. E coli UTI: Completed 5 day course of antibiotic on  01/22.  12. GAD/depression: Manage with Lexapro and Klonopin PRN(took QID at home?) per Dr. Elder Love.             -sleep seems reasonable  -pt is rarely using klonopin at this time    13. ABLA:     -no overt signs of bleeding  -stool guaic pending --request again -2/13 hgb stable at 10.9         LOS: 18 days A FACE TO FACE EVALUATION WAS PERFORMED  Meredith Staggers 08/02/2021, 11:46 AM

## 2021-08-03 ENCOUNTER — Inpatient Hospital Stay (HOSPITAL_COMMUNITY)
Admission: RE | Admit: 2021-08-03 | Discharge: 2021-08-03 | Disposition: A | Discharge status: Skilled Nursing Facility | Payer: Medicare Other | Source: Intra-hospital | Attending: Physical Medicine & Rehabilitation | Admitting: Physical Medicine & Rehabilitation

## 2021-08-03 DIAGNOSIS — R7989 Other specified abnormal findings of blood chemistry: Secondary | ICD-10-CM | POA: Diagnosis present

## 2021-08-03 DIAGNOSIS — I480 Paroxysmal atrial fibrillation: Secondary | ICD-10-CM | POA: Diagnosis present

## 2021-08-03 DIAGNOSIS — D649 Anemia, unspecified: Secondary | ICD-10-CM

## 2021-08-03 DIAGNOSIS — I629 Nontraumatic intracranial hemorrhage, unspecified: Secondary | ICD-10-CM

## 2021-08-03 DIAGNOSIS — Q254 Congenital malformation of aorta unspecified: Secondary | ICD-10-CM

## 2021-08-03 DIAGNOSIS — F411 Generalized anxiety disorder: Secondary | ICD-10-CM

## 2021-08-03 DIAGNOSIS — I639 Cerebral infarction, unspecified: Secondary | ICD-10-CM | POA: Diagnosis present

## 2021-08-03 DIAGNOSIS — R0902 Hypoxemia: Secondary | ICD-10-CM | POA: Diagnosis present

## 2021-08-03 DIAGNOSIS — I10 Essential (primary) hypertension: Secondary | ICD-10-CM | POA: Diagnosis present

## 2021-08-03 MED ORDER — ASPIRIN 81 MG PO TBEC: 81.0000 mg | DELAYED_RELEASE_TABLET | Freq: Every day | ORAL | 11 refills | Status: AC

## 2021-08-03 MED ORDER — POLYETHYLENE GLYCOL 3350 17 G PO PACK: 17.0000 g | PACK | Freq: Every day | ORAL | Status: DC | PRN

## 2021-08-03 MED ORDER — STUDY - ASPIRE - APIXABAN 5 MG OR PLACEBO TABLET (PI-SETHI)
5.0000 mg | ORAL_TABLET | Freq: Two times a day (BID) | ORAL | Status: DC
  Filled 2021-08-03: qty 1

## 2021-08-03 MED ORDER — PROCHLORPERAZINE 25 MG RE SUPP: 12.5000 mg | Freq: Four times a day (QID) | RECTAL | Status: DC | PRN

## 2021-08-03 MED ORDER — ACETAMINOPHEN 325 MG PO TABS: 325.0000 mg | ORAL_TABLET | ORAL | Status: DC | PRN

## 2021-08-03 MED ORDER — ESCITALOPRAM OXALATE 10 MG PO TABS: 10.0000 mg | ORAL_TABLET | Freq: Every day | ORAL | Status: AC

## 2021-08-03 MED ORDER — BISACODYL 10 MG RE SUPP: 10.0000 mg | Freq: Every day | RECTAL | Status: DC | PRN

## 2021-08-03 MED ORDER — TRAZODONE HCL 50 MG PO TABS: 25.0000 mg | ORAL_TABLET | Freq: Every evening | ORAL | Status: DC | PRN

## 2021-08-03 MED ORDER — PROCHLORPERAZINE MALEATE 5 MG PO TABS: 5.0000 mg | ORAL_TABLET | Freq: Four times a day (QID) | ORAL | Status: DC | PRN

## 2021-08-03 MED ORDER — CLONAZEPAM 0.25 MG PO TBDP: 0.2500 mg | ORAL_TABLET | Freq: Two times a day (BID) | ORAL | Status: DC | PRN

## 2021-08-03 MED ORDER — PANTOPRAZOLE SODIUM 40 MG PO TBEC: 40.0000 mg | DELAYED_RELEASE_TABLET | Freq: Every day | ORAL | Status: DC

## 2021-08-03 MED ORDER — CLONAZEPAM 0.5 MG PO TABS: 0.5000 mg | ORAL_TABLET | Freq: Two times a day (BID) | ORAL | Status: DC

## 2021-08-03 MED ORDER — STUDY - ASPIRE - ASPIRIN 81 MG OR PLACEBO TABLET (PI-SETHI): 81.0000 mg | ORAL_TABLET | Freq: Every day | ORAL | Status: DC

## 2021-08-03 MED ORDER — DIPHENHYDRAMINE HCL 12.5 MG/5ML PO ELIX: 12.5000 mg | ORAL_SOLUTION | Freq: Four times a day (QID) | ORAL | Status: DC | PRN

## 2021-08-03 MED ORDER — GUAIFENESIN-DM 100-10 MG/5ML PO SYRP: 5.0000 mL | ORAL_SOLUTION | Freq: Four times a day (QID) | ORAL | Status: DC | PRN

## 2021-08-03 MED ORDER — ALUM & MAG HYDROXIDE-SIMETH 200-200-20 MG/5ML PO SUSP: 30.0000 mL | ORAL | Status: DC | PRN

## 2021-08-03 MED ORDER — ADULT MULTIVITAMIN W/MINERALS CH: 1.0000 | ORAL_TABLET | Freq: Every morning | ORAL | Status: DC

## 2021-08-03 MED ORDER — POLYETHYLENE GLYCOL 3350 17 G PO PACK: 17.0000 g | PACK | Freq: Every day | ORAL | Status: DC

## 2021-08-03 MED ORDER — FLEET ENEMA 7-19 GM/118ML RE ENEM: 1.0000 | ENEMA | Freq: Once | RECTAL | Status: DC | PRN

## 2021-08-03 MED ORDER — AMIODARONE HCL 200 MG PO TABS: 200.0000 mg | ORAL_TABLET | Freq: Every morning | ORAL | Status: DC

## 2021-08-03 MED ORDER — CLONAZEPAM 0.5 MG PO TABS
0.5000 mg | ORAL_TABLET | Freq: Two times a day (BID) | ORAL | 0 refills | Status: AC
Start: 2021-08-03 — End: ?

## 2021-08-03 MED ORDER — AMLODIPINE BESYLATE 2.5 MG PO TABS: 2.5000 mg | ORAL_TABLET | Freq: Every day | ORAL | Status: DC

## 2021-08-03 MED ORDER — PROCHLORPERAZINE EDISYLATE 10 MG/2ML IJ SOLN: 5.0000 mg | Freq: Four times a day (QID) | INTRAMUSCULAR | Status: DC | PRN

## 2021-08-03 MED ORDER — ASPIRIN EC 81 MG PO TBEC: 81.0000 mg | DELAYED_RELEASE_TABLET | Freq: Every day | ORAL | Status: DC

## 2021-08-03 MED ORDER — ESCITALOPRAM OXALATE 10 MG PO TABS: 10.0000 mg | ORAL_TABLET | Freq: Every day | ORAL | Status: DC

## 2021-08-03 MED ORDER — MELATONIN 3 MG PO TABS: 3.0000 mg | ORAL_TABLET | Freq: Every day | ORAL | Status: DC

## 2021-08-03 MED ORDER — METOPROLOL TARTRATE 25 MG PO TABS: 25.0000 mg | ORAL_TABLET | Freq: Two times a day (BID) | ORAL | Status: DC

## 2021-08-03 NOTE — Progress Notes (Signed)
Report given to receiving nurse at [facility] place, chart, history, trial meds, and discharge packet discussed. Provided unit station phone number in case further questions arise.

## 2021-08-03 NOTE — Progress Notes (Signed)
Inpatient Rehabilitation Discharge Medication Review by a Pharmacist  A complete drug regimen review was completed for this patient to identify any potential clinically significant medication issues.  High Risk Drug Classes Is patient taking? Indication by Medication  Antipsychotic No   Anticoagulant No   Antibiotic No   Opioid No   Antiplatelet Yes ASA -  CVA  Hypoglycemics/insulin No   Vasoactive Medication Yes Amiodarone, metoprolol for Afib, amlodipine for BP  Chemotherapy No   Other Yes Lexapro, Klonopin for anxiety Protonix - GERD     Type of Medication Issue Identified Description of Issue Recommendation(s)  Drug Interaction(s) (clinically significant)     Duplicate Therapy     Allergy     No Medication Administration End Date     Incorrect Dose     Additional Drug Therapy Needed     Significant med changes from prior encounter (inform family/care partners about these prior to discharge).    Other       Clinically significant medication issues were identified that warrant physician communication and completion of prescribed/recommended actions by midnight of the next day:  No  Pharmacist comments: None  Time spent performing this drug regimen review (minutes):  20 minutes  Ulyses Southward, PharmD, [place], AAHIVP, CPP Infectious Disease Pharmacist 08/03/2021 1:56 PM

## 2021-08-03 NOTE — H&P (Signed)
This is an interrupted stay with no need for another H&P. Please see today's progress for details of medical management and rehab needs.

## 2021-08-03 NOTE — Progress Notes (Signed)
Inpatient Rehabilitation  Patient resuming Inpatient Rehabilitation Program following an interruption in stay today.  Patient information will continue to be reviewed and entered into eRehab system by [facility]. Karen Kays., CCC/SLP, PPS Coordinator.  Information including medical coding, functional ability, and quality indicators will be reviewed and updated through discharge.

## 2021-08-03 NOTE — Progress Notes (Signed)
Inpatient Rehabilitation Admission Medication Review by a Pharmacist  A complete drug regimen review was completed for this patient to identify any potential clinically significant medication issues.  High Risk Drug Classes Is patient taking? Indication by Medication  Antipsychotic Yes Compazine - PRN N/V  Anticoagulant Yes Aspire Study Drug  Antibiotic No   Opioid No   Antiplatelet Yes Aspire Study Drug  Hypoglycemics/insulin No   Vasoactive Medication Yes Amiodarone, metoprolol for Afib, amlodipine for BP  Chemotherapy No   Other Yes Lexapro, Klonopin for anxiety Protonix - GERD     Type of Medication Issue Identified Description of Issue Recommendation(s)  Drug Interaction(s) (clinically significant)     Duplicate Therapy     Allergy     No Medication Administration End Date     Incorrect Dose     Additional Drug Therapy Needed     Significant med changes from prior encounter (inform family/care partners about these prior to discharge).    Other       Clinically significant medication issues were identified that warrant physician communication and completion of prescribed/recommended actions by midnight of the next day:  No  Pharmacist comments: None  Time spent performing this drug regimen review (minutes):  20 minutes  Ulyses Southward, PharmD, [place], AAHIVP, CPP Infectious Disease Pharmacist 08/03/2021 12:22 PM

## 2021-08-03 NOTE — Progress Notes (Signed)
Inpatient Rehabilitation Discharge Medication Review by a Pharmacist  A complete drug regimen review was completed for this patient to identify any potential clinically significant medication issues.  High Risk Drug Classes Is patient taking? Indication by Medication  Antipsychotic No   Anticoagulant Yes Aspire Study Drug  Antibiotic No   Opioid No   Antiplatelet Yes Aspire Study Drug  Hypoglycemics/insulin No   Vasoactive Medication Yes Amiodarone, metoprolol for Afib, amlodipine for BP  Chemotherapy No   Other Yes Lexapro, Klonopin for anxiety Protonix - GERD     Type of Medication Issue Identified Description of Issue Recommendation(s)  Drug Interaction(s) (clinically significant)     Duplicate Therapy     Allergy     No Medication Administration End Date     Incorrect Dose     Additional Drug Therapy Needed     Significant med changes from prior encounter (inform family/care partners about these prior to discharge).    Other       Clinically significant medication issues were identified that warrant physician communication and completion of prescribed/recommended actions by midnight of the next day:  No  Pharmacist comments: None  Time spent performing this drug regimen review (minutes):  20 minutes  Ulyses Southward, PharmD, [place], AAHIVP, CPP Infectious Disease Pharmacist 08/03/2021 8:42 AM

## 2021-08-03 NOTE — Progress Notes (Signed)
PROGRESS NOTE   Subjective/Complaints: Pt resting. Awoke easily. No problems overnight  ROS: limited due to language/communication     Objective:   No results found. Recent Labs    08/01/21 0606  WBC 6.7  HGB 10.9*  HCT 35.0*  PLT 191     Recent Labs    08/01/21 0606 08/02/21 0511  NA 141 136  K 4.3 4.1  CL 107 104  CO2 26 25  GLUCOSE 96 97  BUN 36* 28*  CREATININE 0.87 0.88  CALCIUM 9.0 9.0     Intake/Output Summary (Last 24 hours) at 08/03/2021 0814 Last data filed at 08/03/2021 0730 Gross per 24 hour  Intake 336 ml  Output --  Net 336 ml          Physical Exam: Vital Signs Blood pressure (!) 122/51, pulse (!) 56, temperature 98.7 F (37.1 C), temperature source Oral, resp. rate 18, height 5\' 3"  (1.6 m), weight 71.8 kg, SpO2 94 %.  Constitutional: No distress . Vital signs reviewed. HEENT: NCAT, EOMI, oral membranes moist Neck: supple Cardiovascular: RRR without murmur. No JVD    Respiratory/Chest: CTA Bilaterally without wheezes or rales. Normal effort    GI/Abdomen: BS +, non-tender, non-distended Ext: no clubbing, cyanosis, or edema Psych: pleasant  Skin: No evidence of breakdown, no evidence of rash Neuro: allert with expressive language deficits. Best with automatic speech. LUE and LLE 1/5 with wrist/finger flexion, didn't see voluntary movement in leg.    flexor tone developing in LUE and extensor tone in LLE. Moves RUE/RLE 4/5.  Sensation impaired still on left Musculoskeletal: sl shoulder sublux   Assessment/Plan: 1. Functional deficits which require 3+ hours per day of interdisciplinary therapy in a comprehensive inpatient rehab setting. Physiatrist is providing close team supervision and 24 hour management of active medical problems listed below. Physiatrist and rehab team continue to assess barriers to discharge/monitor patient progress toward functional and medical goals  Care  Tool:  Bathing    Body parts bathed by patient: Left arm, Chest, Abdomen, Front perineal area, Right upper leg, Left upper leg, Right lower leg, Left lower leg, Face   Body parts bathed by helper: Right arm, Buttocks     Bathing assist Assist Level: Moderate Assistance - Patient 50 - 74%     Upper Body Dressing/Undressing Upper body dressing   What is the patient wearing?: Pull over shirt    Upper body assist Assist Level: Moderate Assistance - Patient 50 - 74%    Lower Body Dressing/Undressing Lower body dressing      What is the patient wearing?: Underwear/pull up, Pants     Lower body assist Assist for lower body dressing: Maximal Assistance - Patient 25 - 49%     Toileting Toileting    Toileting assist Assist for toileting: Moderate Assistance - Patient 50 - 74% (bed level)     Transfers Chair/bed transfer  Transfers assist  Chair/bed transfer activity did not occur: Safety/medical concerns  Chair/bed transfer assist level: Moderate Assistance - Patient 50 - 74% Chair/bed transfer assistive device: Programmer, multimedia   Ambulation assist   Ambulation activity did not occur: Safety/medical concerns  Assist level: 2  helpers Assistive device: Other (comment) (R HHA) Max distance: 35ft   Walk 10 feet activity   Assist  Walk 10 feet activity did not occur: Safety/medical concerns  Assist level: 2 helpers     Walk 50 feet activity   Assist Walk 50 feet with 2 turns activity did not occur: Safety/medical concerns  Assist level: 2 helpers      Walk 150 feet activity   Assist Walk 150 feet activity did not occur: Safety/medical concerns         Walk 10 feet on uneven surface  activity   Assist Walk 10 feet on uneven surfaces activity did not occur: Safety/medical concerns         Wheelchair     Assist Is the patient using a wheelchair?: Yes Type of Wheelchair:  (TIS for positioning and sitting tolerance, unable to  safely sit in manual chair at this time.) Wheelchair activity did not occur: Safety/medical concerns         Wheelchair 50 feet with 2 turns activity    Assist    Wheelchair 50 feet with 2 turns activity did not occur: Safety/medical concerns       Wheelchair 150 feet activity     Assist  Wheelchair 150 feet activity did not occur: Safety/medical concerns   Assist Level: 2 helpers   Blood pressure (!) 122/51, pulse (!) 56, temperature 98.7 F (37.1 C), temperature source Oral, resp. rate 18, height 5\' 3"  (1.6 m), weight 71.8 kg, SpO2 94 %.  Medical Problem List and Plan: 1. 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 antiplatelet therapy: N/a due to thalamic bleed.  3. Pain Management: Tylenol or tramadol prn.  4. Insomnia/mood: continue melatonin 3mg  HS for now  2/14 increased lexapro to 10mg  qhs                7. Fluids/Electrolytes/Nutrition:               --dc IVF today  -will need to be pushed for po intake of fluids, eating well 8. A fib: Monitor HR TID--rate control is good/bradycardic- continue amiodarone and metoprolol.  --Off Eliquis till follow up with neurology.  9. HTN: Monitor BP TID--SBP goal, 160. BP labile, continue metoprolol 25 mg bid with amiodarone 200 mg/day. Continue amlodipine 2.5mg .  -controlled  10. Chronic O2  11. E coli UTI: Completed 5 day course of antibiotic on 01/22.    13. ABLA:     -no overt signs of bleeding  -stool guaic pending --request again -2/13 hgb stable at 10.9         LOS: 19 days A FACE TO FACE EVALUATION WAS PERFORMED  Meredith Staggers 08/03/2021, 8:14 AM

## 2021-08-03 NOTE — Progress Notes (Signed)
Patient is interrupted stay- stable for readmission to CIR

## 2021-08-03 NOTE — Plan of Care (Signed)
°  Problem: Consults Goal: RH STROKE PATIENT EDUCATION Description: See Patient Education module for education specifics  Outcome: Progressing   Problem: RH BOWEL ELIMINATION Goal: RH STG MANAGE BOWEL WITH ASSISTANCE Description: STG Manage Bowel with mod I  Assistance. Outcome: Progressing Goal: RH STG MANAGE BOWEL W/MEDICATION W/ASSISTANCE Description: STG Manage Bowel with Medication with mod I  Assistance. Outcome: Progressing   Problem: RH BLADDER ELIMINATION Goal: RH STG MANAGE BLADDER WITH ASSISTANCE Description: STG Manage Bladder With min Assistance Outcome: Progressing   Problem: RH SKIN INTEGRITY Goal: RH STG SKIN FREE OF INFECTION/BREAKDOWN Description: With min assist Outcome: Progressing   Problem: RH SAFETY Goal: RH STG ADHERE TO SAFETY PRECAUTIONS W/ASSISTANCE/DEVICE Description: STG Adhere to Safety Precautions With supervision Assistance/Device. Outcome: Progressing   Problem: RH PAIN MANAGEMENT Goal: RH STG PAIN MANAGED AT OR BELOW PT'S PAIN GOAL Description: At or below level 4 with prn meds Outcome: Progressing   Problem: RH KNOWLEDGE DEFICIT Goal: RH STG INCREASE KNOWLEDGE OF HYPERTENSION Description: Patient's caregiver will be able to manage HTN with medications and dietary modifications using handouts and educational materials inconveniently Outcome: Progressing Goal: RH STG INCREASE KNOWLEGDE OF HYPERLIPIDEMIA Description: Patient's caregiver will be able to manage HLD with medications and dietary modifications using handouts and educational materials inconveniently Outcome: Progressing   Problem: RH KNOWLEDGE DEFICIT BRAIN INJURY Goal: RH STG INCREASE KNOWLEDGE OF SELF CARE AFTER BRAIN INJURY Description: Patient's caregiver will be able to manage care with medications and dietary modifications using handouts and educational materials inconveniently Outcome: Progressing

## 2021-08-03 NOTE — Progress Notes (Signed)
Patient ID: MINNETTE MERIDA, female   DOB: 02-14-1941, 81 y.o.   MRN: [id number]  Facility declined placement due to pt being in experimental/placebo drug trial (Eliquis vs Aspirin). SW informed pt dtrs [name] and [place] on information and pt will return to previous rehab room. Family would like to pull her out of the clinical trial as well.   SW waiting on updates from Michelle/Admissions with [facility] to discuss what they will need for pt to be admitted since our medical team is working on removing her from the trial.   *Pt removed from trial. Facility willing to accept pt to facility. [facility] rescheduled and will come pick up pt before 6/6:30pm. SW met with pt and [name] in room to discuss transition today. Medical team updated on continued d/c today.   Loralee Pacas, MSW, [place] Office: [phone] Cell: [phone] Fax: [phone]

## 2021-08-03 NOTE — Discharge Summary (Addendum)
Physician Discharge Summary  Patient ID: MUSKAN DOWN MRN: [id number] DOB/AGE: June 24, 1940 81 y.o.  Admit date: 07/15/2021 Discharge date: pending  Discharge Diagnoses:  Principal Problem:   Stroke (cerebrum) [facility]) Active Problems:   Generalized anxiety disorder   Essential hypertension   Paroxysmal A-fib (HCC)   Hypoxia   Prerenal azotemia   Abnormality of aortic arch--3 cm enlargement.    Discharged Condition: stable  Significant Diagnostic Studies:  DG Chest Port 1 View  Result Date: 07/18/2021 CLINICAL DATA:  Hypoxia. EXAM: PORTABLE CHEST 1 VIEW COMPARISON:  07/08/2021 portable chest and chest CTA FINDINGS: Stable severe dextroconvex thoracic scoliosis. Stable enlarged cardiac silhouette and post CABG changes. Stable prominent epicardial fat pad on the left. Clear lungs with normal vascularity. Cholecystectomy clips. IMPRESSION: No acute abnormality. Electronically Signed   By: Claudie Revering M.D.   On: 07/18/2021 09:57    Labs:  Basic Metabolic Panel: Recent Labs  Lab 07/28/21 0605 07/29/21 0516 08/01/21 0606 08/02/21 0511  NA 140 140 141 136  K 3.9 4.0 4.3 4.1  CL 105 105 107 104  CO2 27 27 26 25   GLUCOSE 105* 99 96 97  BUN 33* 30* 36* 28*  CREATININE 1.06* 0.83 0.87 0.88  CALCIUM 8.9 9.0 9.0 9.0    CBC: CBC Latest Ref Rng & Units 08/01/2021 07/28/2021 07/22/2021  WBC 4.0 - 10.5 K/uL 6.7 6.1 5.3  Hemoglobin 12.0 - 15.0 g/dL 10.9(L) 10.6(L) 11.0(L)  Hematocrit 36.0 - 46.0 % 35.0(L) 33.5(L) 35.4(L)  Platelets 150 - 400 K/uL 191 243 299      Brief HPI:   TWANIA SIMMERING is a 81 y.o. female with history of HTN, PAF-on eliquis, anxiety/depression, scoliosis who was admitted via [facility] after fall with difficulty speaking and left sided weakness. CT head showed right thalamic ICH with multiple soft tissue contusion and she was transferred to [facility] for management.  Dr. Leonie Man felt that bleed was hypertensive in setting of [facility].  Eliquis reversed and she was started on  Cleveprex as BP >220 at admission. She was also started on antibiotics for treatment of E coli UTI and has required oxygen due to issues with hypoxia. CTA chest was negative for PE and showed consolidated LLL opacities likely atelectasis without effusion and incidental finding of enlarged aortic arch at  3 cm with recommendations for yearly CTA or MRA for monitoring.   Clear duplex was weaned off with recommendations to keep systolic blood pressure less than 160.  Klonopin was resumed to help manage anxiety and sleeping disruption.  Confusion was resolving and she was weaned down to 2 L per nasal cannula due to ongoing issues with hypoxia.  She did have some issues with abdominal distention and concerns of gastric obstruction but no work-up indicated as patient without pain or nausea vomiting per general surgery input.  She continued to be limited by left hemiplegia with sensory deficits, left body inattention, worsening of baseline cognitive deficits and was working with therapy on balance at edge of bed.  CIR was recommended due to functional decline.   Hospital Course: TYRIEL KARGBO was admitted to rehab 08/03/2021 for inpatient therapies to consist of PT, ST and OT at least three hours five days a week. Past admission physiatrist, therapy team and rehab RN have worked together to provide customized collaborative inpatient rehab. Blood pressures and HR were monitored on TID basis and have been controlled on amiodarone and metoprolol. She continues to have issues with hypoxia and pulmonary hygiene encouraged. She requires  2 L oxygen per [place] due to ongoing issues with hypoxia. Anxiety/mood has been managed on home regimen but noted to have some increase in anxiety prior to discharge therefore Lexapro was increased to 10 mg at discharge.    Follow up check of lytes showed evidence of pre-renal azotemia and she was encouraged to increase fluid intake without improvement. She  was started on nocturnal IVF for  gentle hydration with improvement and recommend offering fluids frequently to maintain adequate hydration status. Follow up CBC showed H/H to be stable. She was started on ASPIRE study drug on 02/03 but family elected to discontinue participation in study in order to improve their pool of accepting facilities. Case was discussed with Dr. Leonie Man who recommended transitioning patient to baby ASA and evaluation in office after discharge for input on resumption of DOAC.  She has been making slow progress and continues to be limited by severe scoliosis, hypoxia, dense left hemiplegia with sensory deficits as well as cognitive deficits affecting functional status. Family has elected on SNF for progressive therapy as her ALF is unable to provide care needed. She was [facility] to [facility] on 08/03/21    Rehab course: During patient's stay in rehab weekly team conferences were held to monitor patient's progress, set goals and discuss barriers to discharge. At admission, patient required max assist +2 with mobility and total assist with basic self care tasks.  She  has had improvement in activity tolerance, balance, postural control as well as ability to compensate for deficits. She has had improvement in functional use LUE  and LLE as well as improvement in awareness. She requires min to mod mod assist for upper body ADLs and mod to max max assist for lower body tasks. She requires mod to max assist for bed mobility. She requires mod to max assist for stand pivot transfers with left knee blocked and is able to stand for a minute with extended rest breaks. .   Disposition: Skilled nursing facility  Diet: Heart Healthy.  Offer fluids frequently to maintain adequate hydration  Special Instructions: Recommend monitoring of hydration status with serial check of electrolytes.  Recommend yearly CTA or MRA chest for monitoring of aortic arch enlargement.   Discharge Instructions     Ambulatory referral to  Neurology   Complete by: As directed    An appointment is requested in approximately: 2-3 weeks   Ambulatory referral to Physical Medicine Rehab   Complete by: As directed        Allergies as of 08/03/2021       Reactions   Penicillins Other (See Comments)   Has patient had a PCN reaction causing immediate rash, facial/tongue/throat swelling, SOB or lightheadedness with hypotension: Unknown Has patient had a PCN reaction causing severe rash involving mucus membranes or skin necrosis: Unknown Has patient had a PCN reaction that required hospitalization: Unknown Has patient had a PCN reaction occurring within the last 10 years: No If all of the above answers are "NO", then may proceed with Cephalosporin use.   Sulfa Antibiotics Other (See Comments)   Unknown reaction        Medication List     STOP taking these medications    apixaban or placebo 5 mg Tabs tablet   aspirin or placebo 81 mg Tabs tablet Replaced by: aspirin 81 MG EC tablet   bacitracin ointment   bisacodyl 10 MG suppository Commonly known as: DULCOLAX   sodium phosphate 7-19 GM/118ML Enem   vitamin B-12 500 MCG  tablet Commonly known as: CYANOCOBALAMIN       TAKE these medications    acetaminophen 325 MG tablet Commonly known as: TYLENOL Take 1-2 tablets (325-650 mg total) by mouth every 4 (four) hours as needed for mild pain.   amiodarone 200 MG tablet Commonly known as: PACERONE Take 1 tablet (200 mg total) by mouth daily. What changed: when to take this   amLODipine 2.5 MG tablet Commonly known as: NORVASC Take 1 tablet (2.5 mg total) by mouth daily.   aspirin 81 MG EC tablet Take 1 tablet (81 mg total) by mouth daily. Swallow whole. Start taking on: August 04, 2021 Replaces: aspirin or placebo 81 mg Tabs tablet   clonazePAM 0.25 MG disintegrating tablet Commonly known as: KLONOPIN Take 1 tablet (0.25 mg total) by mouth 2 (two) times daily as needed (agitation, anxiety).    clonazePAM 0.5 MG tablet Commonly known as: KLONOPIN Take 1 tablet (0.5 mg total) by mouth 2 (two) times daily.   escitalopram 10 MG tablet Commonly known as: LEXAPRO Take 1 tablet (10 mg total) by mouth daily. What changed: additional instructions   melatonin 3 MG Tabs tablet Take 1 tablet (3 mg total) by mouth at bedtime.   metoprolol tartrate 25 MG tablet Commonly known as: LOPRESSOR Take 1 tablet (25 mg total) by mouth 2 (two) times daily.   multivitamin with minerals Tabs tablet Take 1 tablet by mouth every morning. Women's 50 +   pantoprazole 40 MG tablet Commonly known as: PROTONIX Take 1 tablet (40 mg total) by mouth at bedtime.   polyethylene glycol 17 g packet Commonly known as: MIRALAX / GLYCOLAX Take 17 g by mouth daily.   traZODone 50 MG tablet Commonly known as: DESYREL Take 0.5-1 tablets (25-50 mg total) by mouth at bedtime as needed for sleep.           Contact information for follow-up providers     Meredith Staggers, MD Follow up.   Specialty: Physical Medicine and Rehabilitation Why: office will call you with follow up appointment Contact information: [address] [place] [address] [phone]         Garvin Fila, MD Follow up.   Specialties: Neurology, Radiology Why: office will call you with follow up appointment Contact information: [place] [address] [phone]              Contact information for after-discharge care     Destination     [facility] Preferred SNF .   Service: Skilled Nursing Contact information: [address] [place] [phone]                     Signed: Bary Leriche 08/03/2021, 1:44 PM

## 2021-08-04 NOTE — Progress Notes (Signed)
Inpatient Rehabilitation Care Coordinator Discharge Note   Patient Details  Name: Michelle Bauer MRN: [id number] Date of Birth: 29-Apr-1941   Discharge location: D/c to [facility]  Length of Stay: 19 days  Discharge activity level: wheelchair level Mod Assist  Home/community participation: Limited  Patient response DG:LOVFIE Literacy - How often do you need to have someone help you when you read instructions, pamphlets, or other written material from your doctor or pharmacy?: Never  Patient response PP:IRJJOA Isolation - How often do you feel lonely or isolated from those around you?: Never  Services provided included: MD, RD, PT, OT, RN, TR, CM, Pharmacy, Neuropsych, SW, SLP  Financial Services:  Financial Services Utilized: Medicare    Choices offered to/list presented to:    Follow-up services arranged:      Patient response to transportation need: Is the patient able to respond to transportation needs?: Yes In the past 12 months, has lack of transportation kept you from medical appointments or from getting medications?: No In the past 12 months, has lack of transportation kept you from meetings, work, or from getting things needed for daily living?: No  Comments (or additional information):  Patient/Family verbalized understanding of follow-up arrangements:  Yes  Individual responsible for coordination of the follow-up plan: contact pt dtr Carollee Herter or [name]  Confirmed correct DME delivered: Gretchen Short 08/04/2021    Gretchen Short

## 2021-09-19 ENCOUNTER — Inpatient Hospital Stay: Payer: Self-pay | Admitting: Diagnostic Neuroimaging

## 2021-10-06 ENCOUNTER — Inpatient Hospital Stay: Payer: Self-pay | Admitting: Neurology

## 2021-11-09 ENCOUNTER — Encounter: Payer: Medicare Other | Attending: Physical Medicine & Rehabilitation | Admitting: Physical Medicine & Rehabilitation

## 2022-01-30 ENCOUNTER — Telehealth: Payer: Self-pay | Admitting: Cardiovascular Disease

## 2022-01-30 NOTE — Telephone Encounter (Signed)
Attempted to schedule 3 times, deleting from recall. 

## 2022-06-19 IMAGING — DX DG ABD PORTABLE 1V
2 series · 2 of 2 positions shown · non-contrast
Comparison: None.

CLINICAL DATA: Abdominal pain.

EXAM:
PORTABLE ABDOMEN - 1 VIEW

[abdomen kub (1 of 2)]
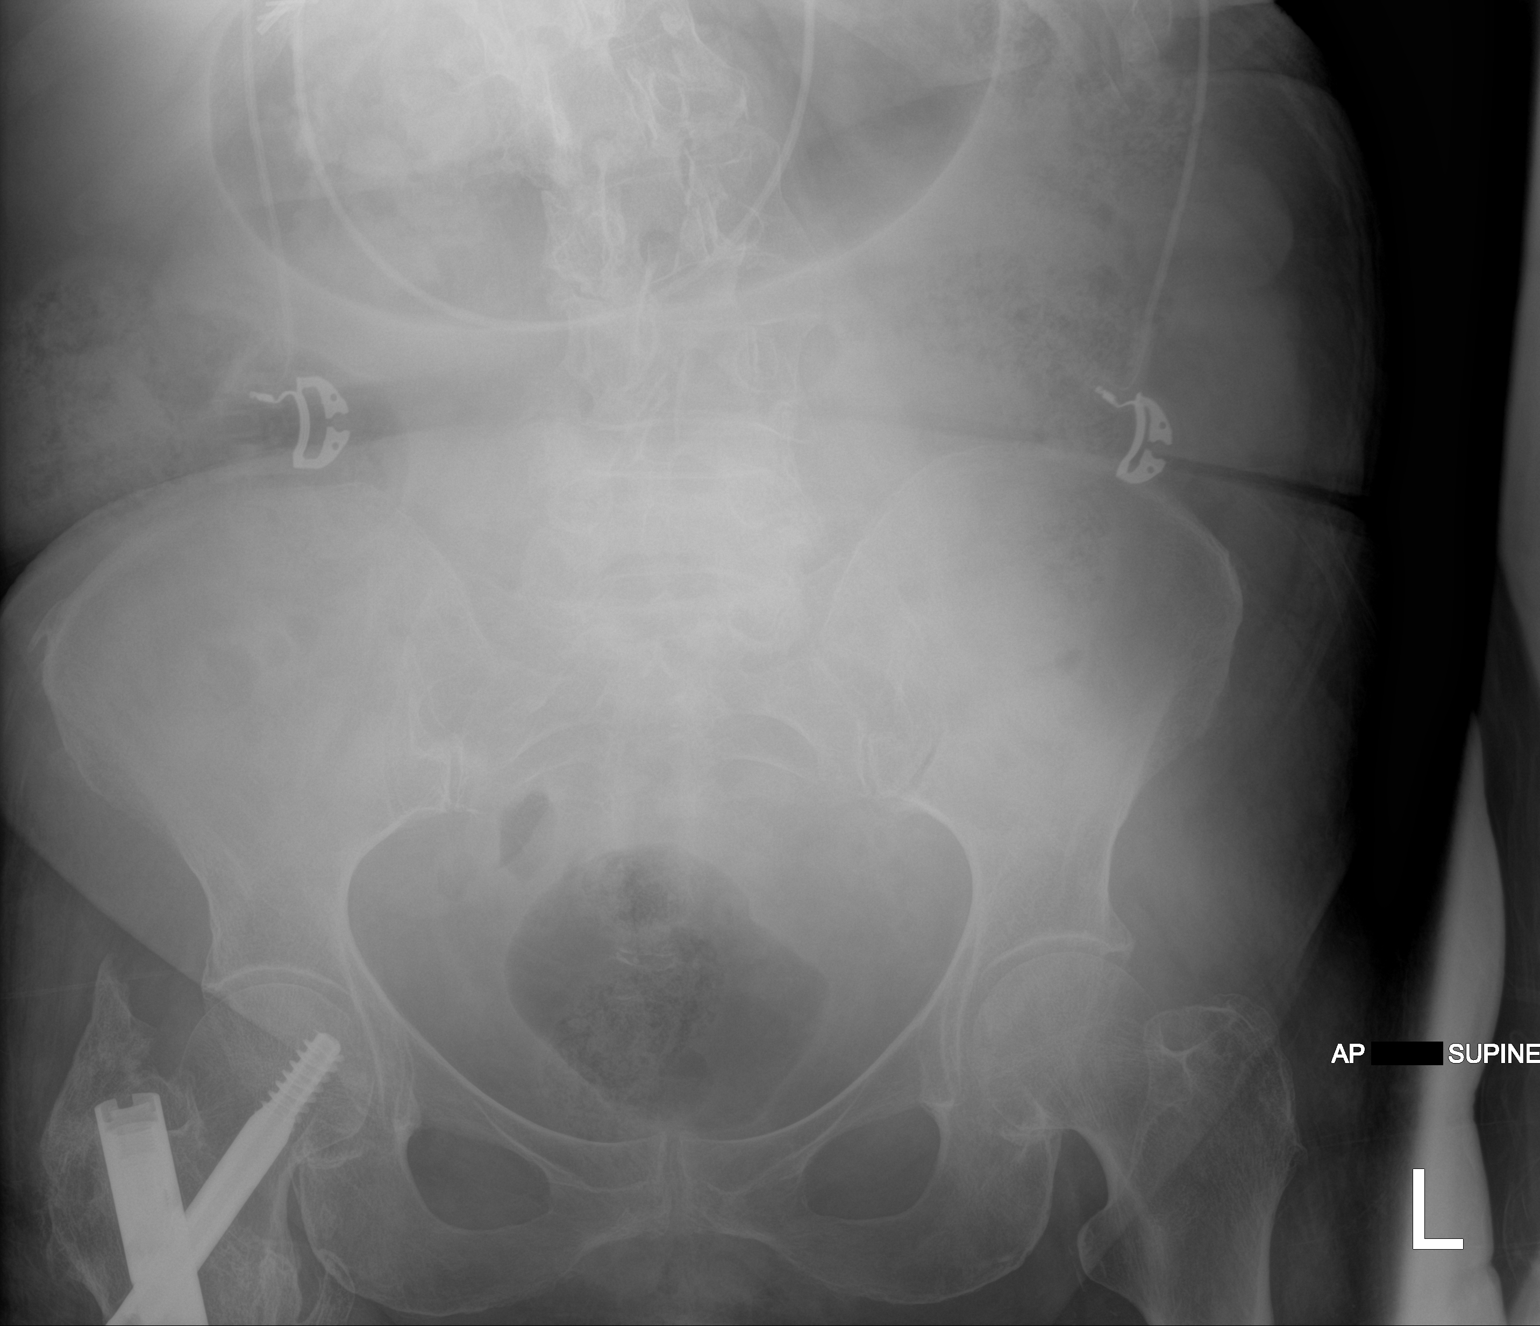

[abdomen kub (2 of 2)]
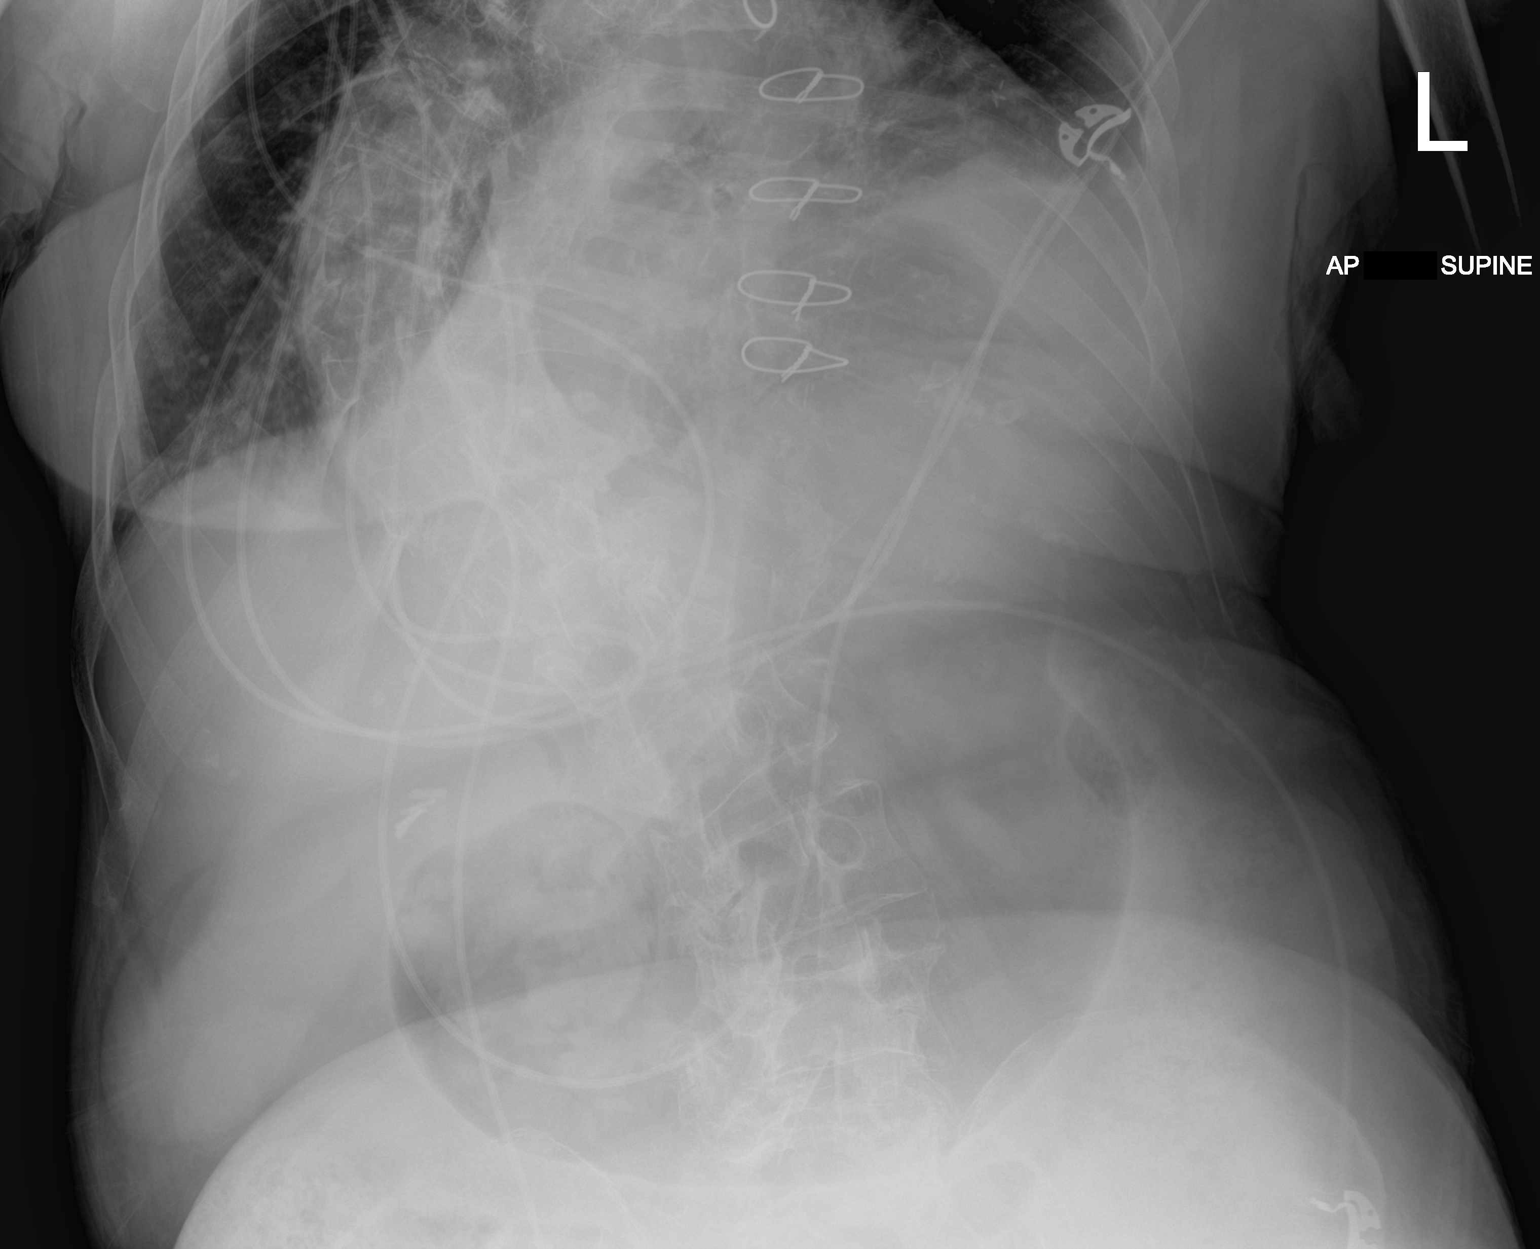

[2 of 2 positions shown; findings below may reference images not displayed]

FINDINGS: The stomach is dilated and air-filled. No other dilated bowel loops
are seen. Air seen to the level of the rectum. There is a large
amount of stool throughout the colon. No suspicious calcifications.

There is scoliosis of the thoracolumbar spine. Sternotomy wires,
cholecystectomy clips and right hip screw are present.
IMPRESSION: 1. The stomach is dilated and air-filled. Please correlate
clinically. Findings may related to gastric outlet obstruction,
gastroparesis or other etiology.

## 2022-06-23 IMAGING — DX DG CHEST 1V PORT
2 series · 2 of 2 positions shown · non-contrast
Comparison: 07/08/2021 portable chest and chest CTA

CLINICAL DATA: Hypoxia.

EXAM:
PORTABLE CHEST 1 VIEW

[chest ap (1 of 2)]
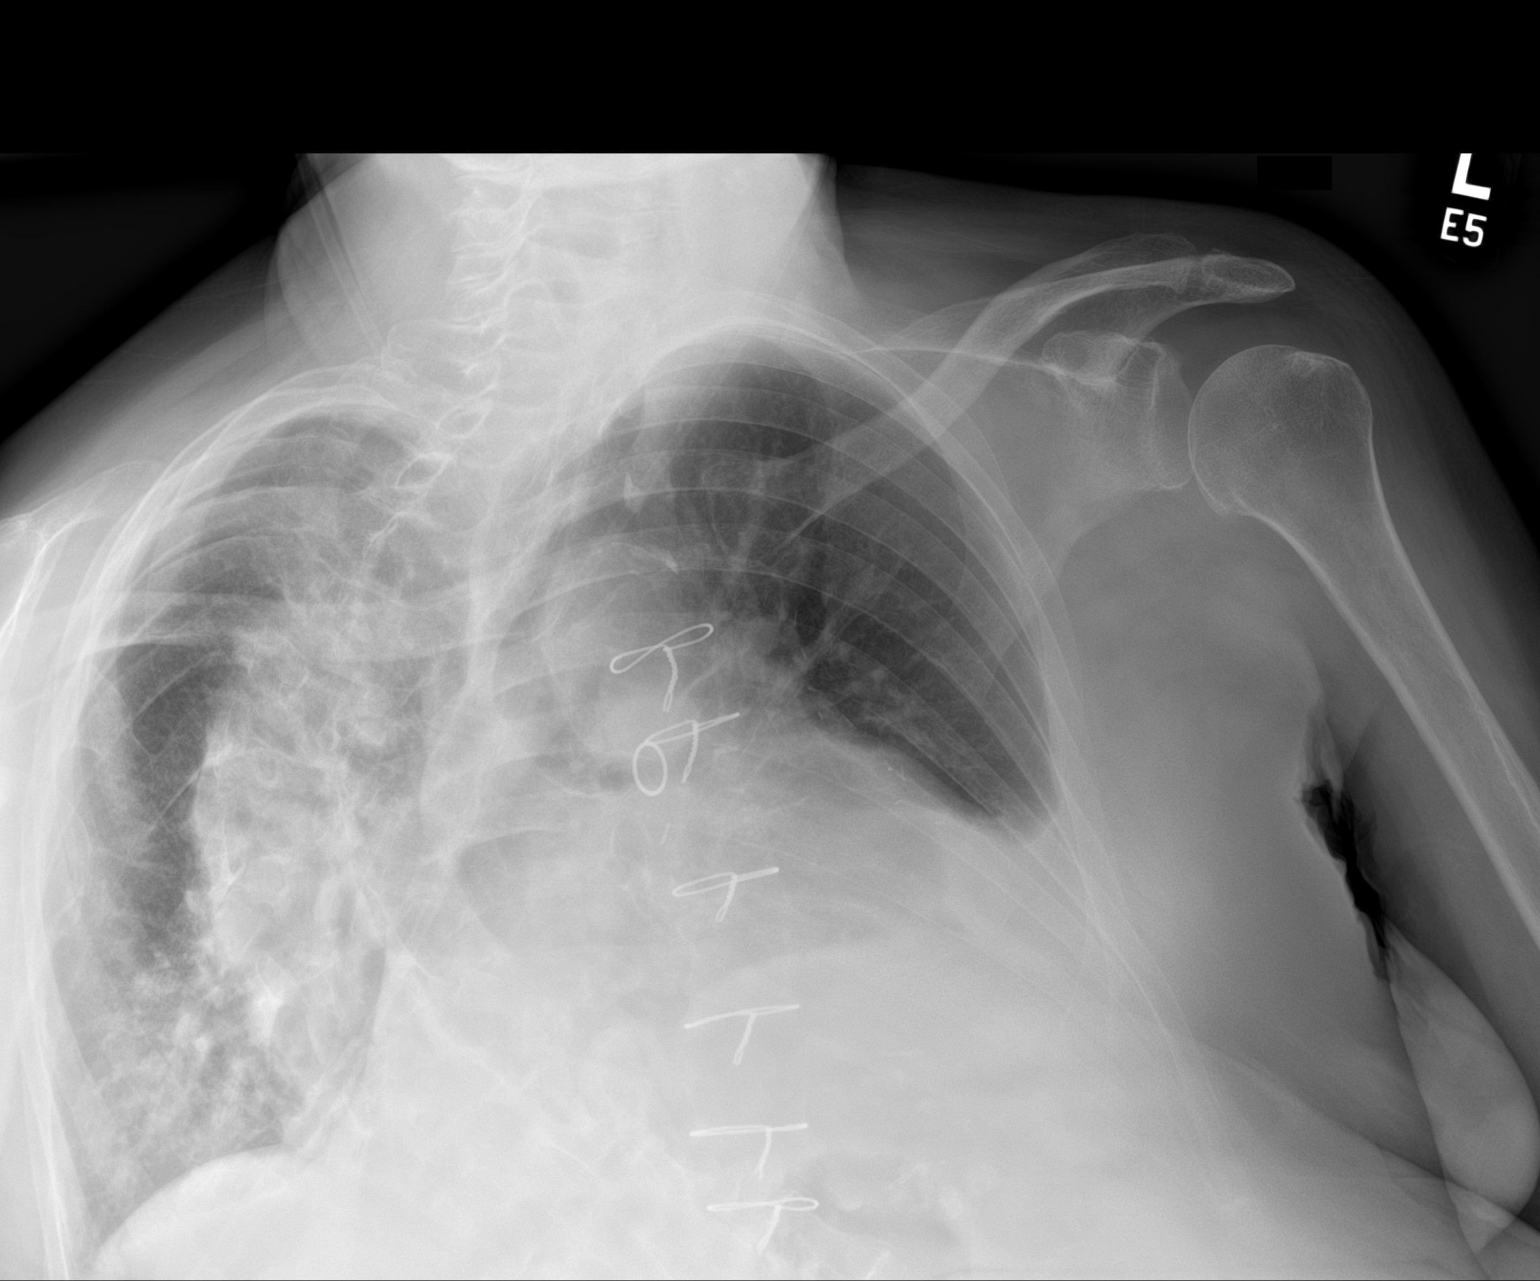

[chest ap (2 of 2)]
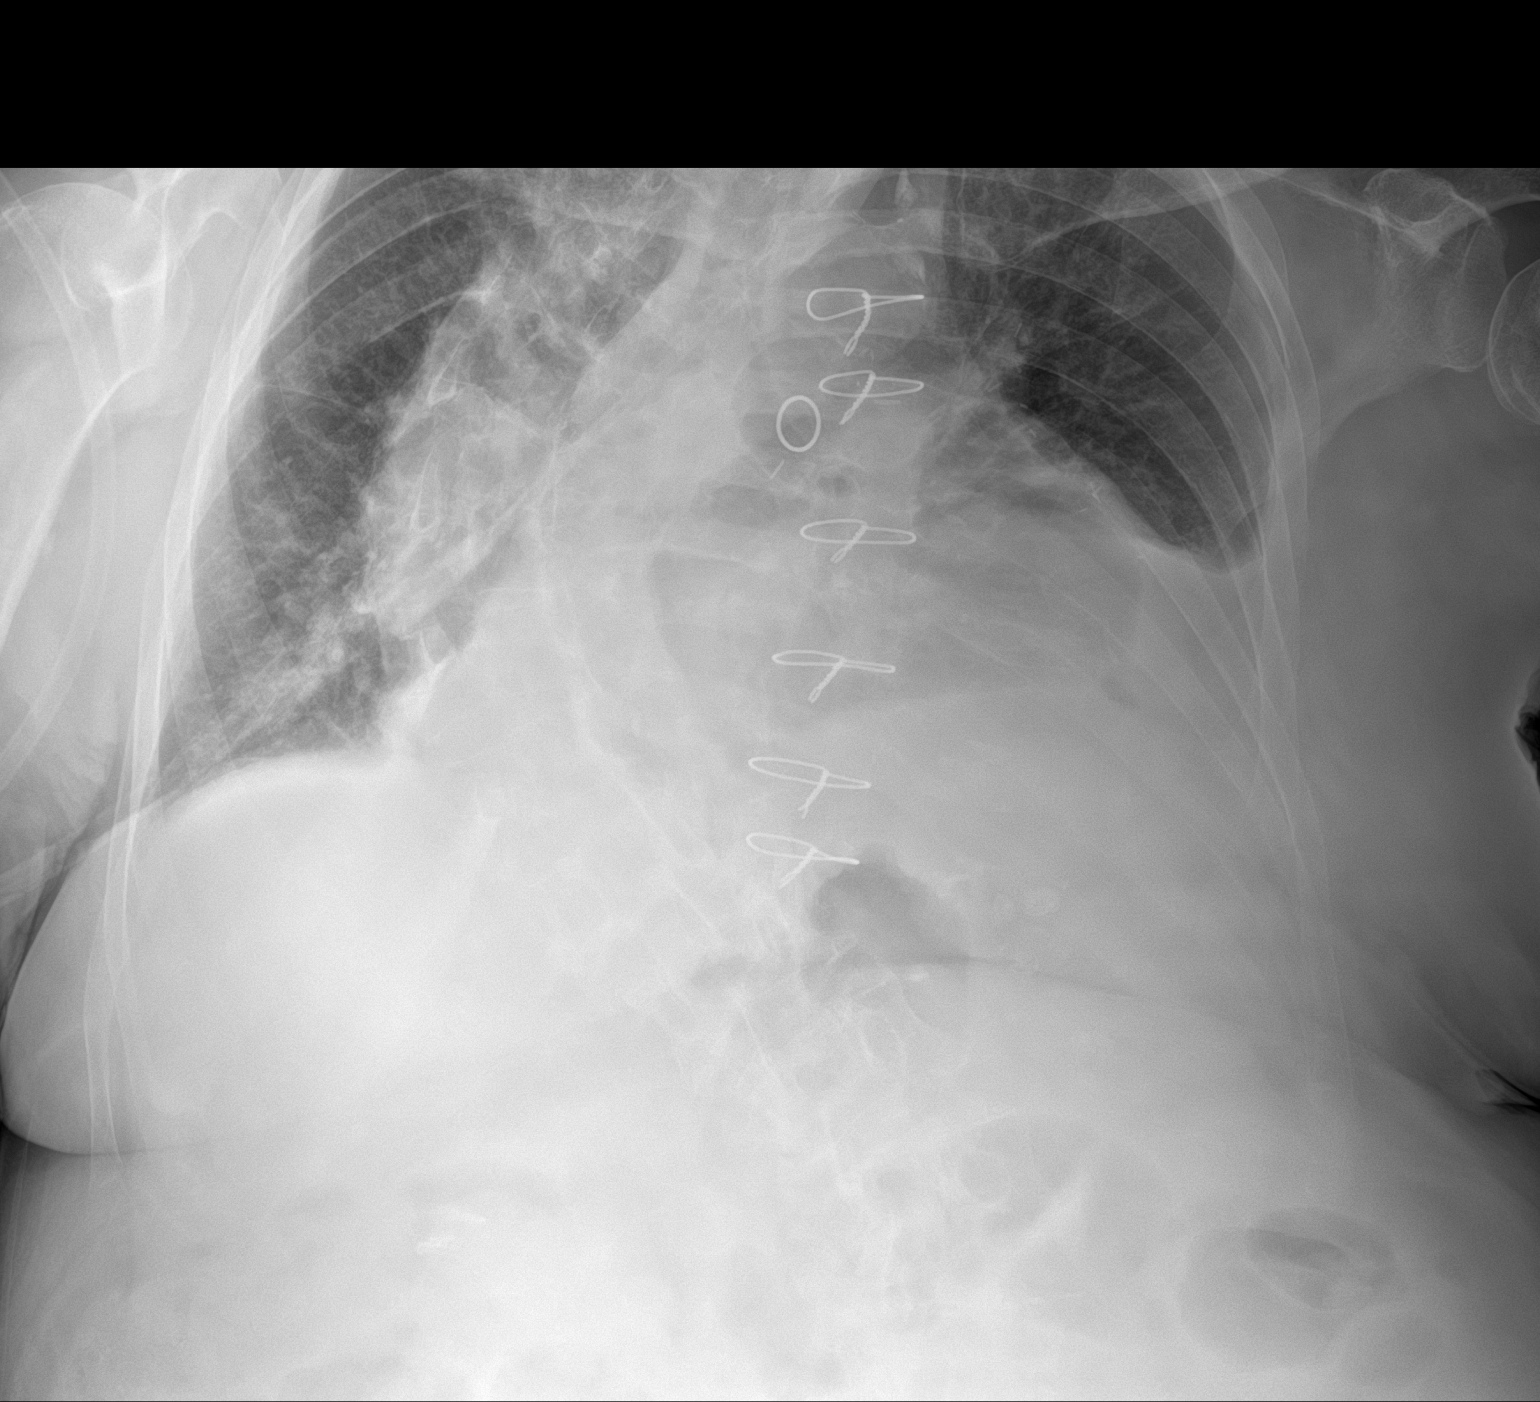

[2 of 2 positions shown; findings below may reference images not displayed]

FINDINGS: Stable severe dextroconvex thoracic scoliosis. Stable enlarged
cardiac silhouette and post CABG changes. Stable prominent
epicardial fat pad on the left. Clear lungs with normal vascularity.
Cholecystectomy clips.
IMPRESSION: No acute abnormality.

## 2022-10-23 ENCOUNTER — Encounter (HOSPITAL_COMMUNITY): Payer: Self-pay

## 2023-04-20 DEATH — deceased
